# Patient Record
Sex: Male | Born: 1950
Health system: Southern US, Community
[De-identification: ages and names within clinical notes are randomized; demographics above are authoritative.]

## PROBLEM LIST (undated history)

## (undated) DIAGNOSIS — I10 Essential (primary) hypertension: Secondary | ICD-10-CM

## (undated) DIAGNOSIS — E782 Mixed hyperlipidemia: Secondary | ICD-10-CM

## (undated) DIAGNOSIS — Z8673 Personal history of transient ischemic attack (TIA), and cerebral infarction without residual deficits: Secondary | ICD-10-CM

## (undated) DIAGNOSIS — M75100 Unspecified rotator cuff tear or rupture of unspecified shoulder, not specified as traumatic: Secondary | ICD-10-CM

## (undated) DIAGNOSIS — E119 Type 2 diabetes mellitus without complications: Secondary | ICD-10-CM

## (undated) DIAGNOSIS — M109 Gout, unspecified: Secondary | ICD-10-CM

## (undated) DIAGNOSIS — I119 Hypertensive heart disease without heart failure: Secondary | ICD-10-CM

## (undated) DIAGNOSIS — G4730 Sleep apnea, unspecified: Secondary | ICD-10-CM

## (undated) DIAGNOSIS — M1990 Unspecified osteoarthritis, unspecified site: Secondary | ICD-10-CM

## (undated) DIAGNOSIS — D369 Benign neoplasm, unspecified site: Secondary | ICD-10-CM

## (undated) HISTORY — DX: Unspecified rotator cuff tear or rupture of unspecified shoulder, not specified as traumatic: M75.100

## (undated) HISTORY — DX: Type 2 diabetes mellitus without complications: E11.9

## (undated) HISTORY — DX: Sleep apnea, unspecified: G47.30

## (undated) HISTORY — DX: Essential (primary) hypertension: I10

## (undated) HISTORY — DX: Mixed hyperlipidemia: E78.2

## (undated) HISTORY — DX: Hypertensive heart disease without heart failure: I11.9

## (undated) HISTORY — PX: CARPAL TUNNEL RELEASE: SHX101

## (undated) HISTORY — DX: Gout, unspecified: M10.9

## (undated) HISTORY — DX: Personal history of transient ischemic attack (TIA), and cerebral infarction without residual deficits: Z86.73

## (undated) HISTORY — DX: Benign neoplasm, unspecified site: D36.9

## (undated) HISTORY — PX: APPENDECTOMY: SHX54

## (undated) SURGERY — COLONOSCOPY
Anesthesia: Moderate Sedation

---

## 1998-10-18 DIAGNOSIS — I639 Cerebral infarction, unspecified: Secondary | ICD-10-CM

## 1998-10-18 HISTORY — DX: Cerebral infarction, unspecified: I63.9

## 2004-02-11 ENCOUNTER — Ambulatory Visit (HOSPITAL_COMMUNITY): Admission: RE | Admit: 2004-02-11 | Discharge: 2004-02-11 | Payer: Self-pay | Admitting: Family Medicine

## 2010-04-23 ENCOUNTER — Emergency Department (HOSPITAL_COMMUNITY): Admission: EM | Admit: 2010-04-23 | Discharge: 2010-04-23 | Payer: Self-pay | Admitting: Emergency Medicine

## 2011-10-18 ENCOUNTER — Encounter: Payer: Self-pay | Admitting: Cardiology

## 2011-10-22 ENCOUNTER — Other Ambulatory Visit: Payer: Self-pay | Admitting: Cardiology

## 2011-10-22 ENCOUNTER — Ambulatory Visit (INDEPENDENT_AMBULATORY_CARE_PROVIDER_SITE_OTHER): Payer: Private Health Insurance - Indemnity | Admitting: Cardiology

## 2011-10-22 ENCOUNTER — Ambulatory Visit (HOSPITAL_COMMUNITY)
Admission: RE | Admit: 2011-10-22 | Discharge: 2011-10-22 | Disposition: A | Discharge status: Home / Self Care | Payer: Private Health Insurance - Indemnity | Source: Ambulatory Visit | Attending: Cardiology | Admitting: Cardiology

## 2011-10-22 ENCOUNTER — Encounter: Payer: Self-pay | Admitting: Cardiology

## 2011-10-22 VITALS — BP 178/98 | HR 60 | Resp 18 | Ht 74.0 in | Wt 259.0 lb

## 2011-10-22 DIAGNOSIS — E782 Mixed hyperlipidemia: Secondary | ICD-10-CM

## 2011-10-22 DIAGNOSIS — E1159 Type 2 diabetes mellitus with other circulatory complications: Secondary | ICD-10-CM | POA: Insufficient documentation

## 2011-10-22 DIAGNOSIS — E119 Type 2 diabetes mellitus without complications: Secondary | ICD-10-CM | POA: Insufficient documentation

## 2011-10-22 DIAGNOSIS — R0602 Shortness of breath: Secondary | ICD-10-CM

## 2011-10-22 DIAGNOSIS — E785 Hyperlipidemia, unspecified: Secondary | ICD-10-CM | POA: Insufficient documentation

## 2011-10-22 DIAGNOSIS — E1100 Type 2 diabetes mellitus with hyperosmolarity without nonketotic hyperglycemic-hyperosmolar coma (NKHHC): Secondary | ICD-10-CM

## 2011-10-22 DIAGNOSIS — G4730 Sleep apnea, unspecified: Secondary | ICD-10-CM

## 2011-10-22 DIAGNOSIS — I10 Essential (primary) hypertension: Secondary | ICD-10-CM | POA: Insufficient documentation

## 2011-10-22 DIAGNOSIS — R011 Cardiac murmur, unspecified: Secondary | ICD-10-CM

## 2011-10-22 DIAGNOSIS — R0989 Other specified symptoms and signs involving the circulatory and respiratory systems: Secondary | ICD-10-CM | POA: Insufficient documentation

## 2011-10-22 DIAGNOSIS — I517 Cardiomegaly: Secondary | ICD-10-CM

## 2011-10-22 DIAGNOSIS — R0609 Other forms of dyspnea: Secondary | ICD-10-CM | POA: Insufficient documentation

## 2011-10-22 NOTE — Assessment & Plan Note (Signed)
Most likely essential hypertension, poorly controlled. We discussed weight loss, sodium restriction, also reduction in regular alcohol intake. He reports compliance with his medications and plans to get a new blood pressure monitor - should record daily and bring to next visit. Plan further titration of medications as needed. Will check BMET, UA, TSH as well. Followup arranged.

## 2011-10-22 NOTE — Assessment & Plan Note (Signed)
Reports statin intolerance. Followed by Dr. Regino Schultze. Goal LDL optimally under 100 with diabetes.

## 2011-10-22 NOTE — Patient Instructions (Addendum)
**Note De-Identified Jenissa Tyrell Obfuscation** Your physician has requested that you regularly monitor and record your blood pressure readings at home. Please use the same machine at the same time of day to check your readings and record them to bring to your follow-up visit.  Your physician has requested that you have an echocardiogram. Echocardiography is a painless test that uses sound waves to create images of your heart. It provides your doctor with information about the size and shape of your heart and how well your heart's chambers and valves are working. This procedure takes approximately one hour. There are no restrictions for this procedure.  Your physician has recommended that you have a sleep study. This test records several body functions during sleep, including: brain activity, eye movement, oxygen and carbon dioxide blood levels, heart rate and rhythm, breathing rate and rhythm, the flow of air through your mouth and nose, snoring, body muscle movements, and chest and belly movement.  Your physician recommends that you return for lab work in: today   Your physician recommends that you continue on your current medications as directed. Please refer to the Current Medication list given to you today.  Your physician recommends that you schedule a follow-up appointment in: 3 weeks

## 2011-10-22 NOTE — Assessment & Plan Note (Signed)
Further assessment with echocardiogram.

## 2011-10-22 NOTE — Assessment & Plan Note (Signed)
Likely multifactorial, but need to exclude cardiomyopathy with longstanding hypertension. Echocardiogram arranged.

## 2011-10-22 NOTE — Assessment & Plan Note (Signed)
Followed by Dr. McGough. 

## 2011-10-22 NOTE — Assessment & Plan Note (Signed)
Symptoms are suggestive of OSA. Will refer to Pulmonary for formal evaluation.

## 2011-10-22 NOTE — Progress Notes (Signed)
*  PRELIMINARY RESULTS* Echocardiogram 2D Echocardiogram has been performed.  Benjamin [name] 10/22/2011, 1:16 PM

## 2011-10-22 NOTE — Progress Notes (Signed)
Clinical Summary Benjamin [name] is a 61 y.o.male referred for cardiology consultation by Dr. Regino Schultze secondary to hypertension. He reports a 15 year history of hypertension and diabetes, not sure about specific medications over the years. He has been on Lopressor for a while now and Tribenzor is new as of about a week. He got a home blood pressure monitor, but states that it has not worked, and he plans to get a new one.  Has reports no chest pain, but does have NYHA class II-III dyspnea on exertion. No palpitations or syncope. ECG reviewed.  He states that he is aware that he snores, does not always feel rested in the morning.  He drinks a case of beer each week.   Allergies  Allergen Reactions  . Advicor   . Zocor (Simvastatin - High Dose)     Current Outpatient Prescriptions  Medication Sig Dispense Refill  . aspirin 81 MG tablet Take 81 mg by mouth daily.      [name] CINNAMON PO Take by mouth daily.      [name] glimepiride (AMARYL) 4 MG tablet Take 4 mg by mouth 2 (two) times daily.      [name] ibuprofen (ADVIL,MOTRIN) 800 MG tablet Take 800 mg by mouth every 8 (eight) hours as needed.      . indomethacin (INDOCIN) 50 MG capsule Take 50 mg by mouth 3 (three) times daily with meals.      . metFORMIN (GLUCOPHAGE) 500 MG tablet Take 1,000 mg by mouth 2 (two) times daily with a meal.      . metoprolol (LOPRESSOR) 100 MG tablet Take 100 mg by mouth 2 (two) times daily.      . niacin (NIASPAN) 500 MG CR tablet Take 500 mg by mouth at bedtime.      . Olmesartan-Amlodipine-HCTZ (TRIBENZOR) 40-5-12.5 MG TABS Take by mouth daily.      . Omega-3 Fatty Acids (FISH OIL PO) Take by mouth daily.      . ranitidine (ZANTAC) 150 MG tablet Take 150 mg by mouth 2 (two) times daily.      . Red Yeast Rice Extract (RED YEAST RICE PO) Take by mouth daily.        Past Medical History  Diagnosis Date  . Essential hypertension, benign   . Type 2 diabetes mellitus   . Mixed hyperlipidemia     Statin intolerance     Past Surgical History  Procedure Date  . Appendectomy     Family History  Problem Relation Age of Onset  . Diabetes type II Mother     Social History Benjamin [name] reports that he has quit smoking. His smoking use included Cigarettes. He has never used smokeless tobacco. Benjamin [name] reports that he drinks alcohol.  Review of Systems Occasional nose bleeds at night. No exercise other than work. No reports melena or hematochezia.   Physical Examination Filed Vitals:   10/22/11 0907  BP: 178/98  Pulse: 60  Resp:    Obese male in NAD. HEENT: Conjunctiva and lids normal, oropharynx clear with moist mucosa. Neck: Supple, no elevated JVP or carotid bruits, no thyromegaly. Lungs: Clear to auscultation, nonlabored breathing at rest. Cardiac: Regular rate and rhythm, no S3, 2/6 systolic murmur, no pericardial rub. Abdomen: Protuberant, soft, nontender, bowel sounds present, no guarding or rebound. Extremities: No pitting edema, distal pulses 2+. Skin: Warm and dry. Musculoskeletal: No kyphosis. Neuropsychiatric: Alert and oriented x3, affect grossly appropriate.   ECG SInus rhythm with left axis, decreased R  wave progression, NST changes.    Problem List and Plan

## 2011-10-23 LAB — URINALYSIS
Bilirubin Urine: NEGATIVE
Glucose, UA: NEGATIVE mg/dL
Ketones, ur: NEGATIVE mg/dL
Protein, ur: 100 mg/dL — AB
Urobilinogen, UA: 0.2 mg/dL (ref 0.0–1.0)

## 2011-10-23 LAB — BASIC METABOLIC PANEL
BUN: 20 mg/dL (ref 6–23)
Chloride: 99 mEq/L (ref 96–112)
Creat: 1.1 mg/dL (ref 0.50–1.35)
Glucose, Bld: 162 mg/dL — ABNORMAL HIGH (ref 70–99)
Potassium: 4.1 mEq/L (ref 3.5–5.3)
Sodium: 141 mEq/L (ref 135–145)

## 2011-10-23 NOTE — Progress Notes (Signed)
**Note De-Identified Genecis Veley Obfuscation** Results letter sent to pt's address./LV

## 2011-11-16 ENCOUNTER — Encounter: Payer: Self-pay | Admitting: Cardiology

## 2011-11-16 ENCOUNTER — Ambulatory Visit (INDEPENDENT_AMBULATORY_CARE_PROVIDER_SITE_OTHER): Payer: Private Health Insurance - Indemnity | Admitting: Cardiology

## 2011-11-16 VITALS — BP 193/97 | HR 64 | Ht 73.0 in | Wt 261.0 lb

## 2011-11-16 DIAGNOSIS — R011 Cardiac murmur, unspecified: Secondary | ICD-10-CM

## 2011-11-16 DIAGNOSIS — G4730 Sleep apnea, unspecified: Secondary | ICD-10-CM

## 2011-11-16 DIAGNOSIS — I10 Essential (primary) hypertension: Secondary | ICD-10-CM

## 2011-11-16 MED ORDER — OLMESARTAN-AMLODIPINE-HCTZ 40-10-25 MG PO TABS: 1.0000 | ORAL_TABLET | Freq: Every day | ORAL | Status: DC

## 2011-11-16 NOTE — Assessment & Plan Note (Signed)
Calcified aortic valve without significant degree of stenosis.

## 2011-11-16 NOTE — Assessment & Plan Note (Signed)
Testing reviewed. Plan to increase Tribenzor to 40/10/25 mg daily for now and continue Lopressor. Followup arranged.

## 2011-11-16 NOTE — Patient Instructions (Signed)
**Note De-Identified Tunisia Landgrebe Obfuscation** Your physician has recommended you make the following change in your medication: increase Tribenzor to 40/10/25 mg daily  Your physician recommends that you schedule a follow-up appointment in: 1 month

## 2011-11-16 NOTE — Assessment & Plan Note (Signed)
Follow up for sleep study

## 2011-11-16 NOTE — Progress Notes (Signed)
   Clinical Summary Benjamin [name] is a 61 y.o.male presenting for followup. He was seen in February for evaluation of hypertension.  Labwork showed potassium 4.1, BUN 20, creatinine 1.1, TSH 1.7, UA with proteinuria. Echocardiogram showed moderate LVH with LVEF 65-70%, calcified AV without significant degree of stenosis. His sleep study is pending.  He reports no new symptoms. States home blood pressures are similar to today's.   Allergies  Allergen Reactions  . Advicor   . Zocor (Simvastatin - High Dose)     Current Outpatient Prescriptions  Medication Sig Dispense Refill  . aspirin 81 MG tablet Take 81 mg by mouth daily.      [name] CINNAMON PO Take by mouth daily.      [name] glimepiride (AMARYL) 4 MG tablet Take 4 mg by mouth 2 (two) times daily.      [name] ibuprofen (ADVIL,MOTRIN) 800 MG tablet Take 800 mg by mouth every 8 (eight) hours as needed.      . indomethacin (INDOCIN) 50 MG capsule Take 50 mg by mouth 3 (three) times daily with meals.      . metFORMIN (GLUCOPHAGE) 500 MG tablet Take 1,000 mg by mouth 2 (two) times daily with a meal.      . metoprolol (LOPRESSOR) 100 MG tablet Take 100 mg by mouth 2 (two) times daily.      . niacin (NIASPAN) 500 MG CR tablet Take 500 mg by mouth at bedtime.      . Olmesartan-Amlodipine-HCTZ (TRIBENZOR) 40-5-12.5 MG TABS Take by mouth daily.      . Omega-3 Fatty Acids (FISH OIL PO) Take by mouth daily.      . ranitidine (ZANTAC) 150 MG tablet Take 150 mg by mouth 2 (two) times daily.      . Red Yeast Rice Extract (RED YEAST RICE PO) Take by mouth daily.        Past Medical History  Diagnosis Date  . Essential hypertension, benign   . Type 2 diabetes mellitus   . Mixed hyperlipidemia     Statin intolerance    Social History Benjamin [name] reports that he has quit smoking. His smoking use included Cigarettes. He has never used smokeless tobacco. Benjamin [name] reports that he drinks alcohol.  Review of Systems Negative except as outlined.  Physical  Examination Filed Vitals:   11/16/11 1517  BP: 193/97  Pulse: 64   Obese male in NAD.  HEENT: Conjunctiva and lids normal, oropharynx clear with moist mucosa.  Neck: Supple, no elevated JVP or carotid bruits, no thyromegaly.  Lungs: Clear to auscultation, nonlabored breathing at rest.  Cardiac: Regular rate and rhythm, no S3, 2/6 systolic murmur, no pericardial rub.  Abdomen: Protuberant, soft, nontender, bowel sounds present, no guarding or rebound.  Extremities: No pitting edema, distal pulses 2+.  Skin: Warm and dry.  Musculoskeletal: No kyphosis.  Neuropsychiatric: Alert and oriented x3, affect grossly appropriate.    Problem List and Plan

## 2011-12-17 ENCOUNTER — Telehealth: Payer: Self-pay | Admitting: Cardiology

## 2011-12-17 NOTE — Telephone Encounter (Signed)
PT HAS RUN OUT OF TRIBENZOR SAMPLES AND NEED MORE SAMPLES TO GET HIM THROUGH TILL NEXT FOLLOW UP IS SCHEDULED FOR 12/24/11 BUT THEY THINK THEY WILL HAVE TO R/S. HE WILL CALL us BACK. IF THEY R.S IT WILL BE TO 01/13/12 DR MCDOWELL'S NEXT AVAIL [place].

## 2011-12-17 NOTE — Telephone Encounter (Signed)
Pt. states his daughter will come by office today to pick up samples of Tribenzor./LV

## 2011-12-17 NOTE — Telephone Encounter (Signed)
**Note De-Identified Benjamin [name] Obfuscation** Pt. Is advised that we are out of samples of Tribenzor 40mg /10mg /25 mg but that we do have 21 tablets of 20mg /5mg /12.5 mg. Pt. Is instructed to take 2 tablets of the 20/5/12.5 mg to equal 40/10/25 mg daily, he repeated instructions back to me and verbalized understanding. Pt. States that he had to rescheduled sleep study again due to work (he has canceled X 2). Pt. Also states that he will keep appt. that is scheduled for 4-5 with Dr. Diona Browner./LV

## 2011-12-21 ENCOUNTER — Ambulatory Visit (INDEPENDENT_AMBULATORY_CARE_PROVIDER_SITE_OTHER): Payer: Private Health Insurance - Indemnity | Admitting: Cardiology

## 2011-12-21 ENCOUNTER — Encounter: Payer: Self-pay | Admitting: Cardiology

## 2011-12-21 VITALS — BP 157/87 | HR 66 | Wt 274.0 lb

## 2011-12-21 DIAGNOSIS — I10 Essential (primary) hypertension: Secondary | ICD-10-CM

## 2011-12-21 DIAGNOSIS — G4730 Sleep apnea, unspecified: Secondary | ICD-10-CM

## 2011-12-21 MED ORDER — LOSARTAN POTASSIUM 50 MG PO TABS: 50.0000 mg | ORAL_TABLET | Freq: Every day | ORAL | Status: DC

## 2011-12-21 MED ORDER — AMLODIPINE BESYLATE 10 MG PO TABS: 10.0000 mg | ORAL_TABLET | Freq: Every day | ORAL | Status: DC

## 2011-12-21 MED ORDER — HYDROCHLOROTHIAZIDE 25 MG PO TABS: 25.0000 mg | ORAL_TABLET | Freq: Every day | ORAL | Status: DC

## 2011-12-21 NOTE — Assessment & Plan Note (Signed)
Did not complete sleep testing.

## 2011-12-21 NOTE — Assessment & Plan Note (Signed)
Change from Tribenzor samples to Cozaar 50 mg daily, HCTZ 25 mg daily, and Norvasc 10 mg daily - all generics. Followup arranged.

## 2011-12-21 NOTE — Progress Notes (Signed)
   Clinical Summary Mr. Mcknight is a 61 y.o.male presenting for followup. He was seen in March. He states that he has been doing well overall. Blood pressure trend is better. He has been on samples of Tribenzor, and did run out at one point.  We discussed changing him to an all generic regimen.  He was not able to complete a sleep study since last visit, related to insurance restrictions by report.   Allergies  Allergen Reactions  . Advicor   . Zocor (Simvastatin - High Dose)     Current Outpatient Prescriptions  Medication Sig Dispense Refill  . aspirin 81 MG tablet Take 81 mg by mouth daily.      [name] CINNAMON PO Take by mouth daily.      [name] glimepiride (AMARYL) 4 MG tablet Take 4 mg by mouth 2 (two) times daily.      [name] ibuprofen (ADVIL,MOTRIN) 800 MG tablet Take 800 mg by mouth every 8 (eight) hours as needed.      . indomethacin (INDOCIN) 50 MG capsule Take 50 mg by mouth 3 (three) times daily with meals.      . metFORMIN (GLUCOPHAGE) 500 MG tablet Take 1,000 mg by mouth 2 (two) times daily with a meal.      . metoprolol (LOPRESSOR) 100 MG tablet Take 100 mg by mouth 2 (two) times daily.      . niacin (NIASPAN) 500 MG CR tablet Take 500 mg by mouth at bedtime.      . Omega-3 Fatty Acids (FISH OIL PO) Take by mouth daily.      . ranitidine (ZANTAC) 150 MG tablet Take 150 mg by mouth 2 (two) times daily.      . Red Yeast Rice Extract (RED YEAST RICE PO) Take by mouth daily.      [name] amLODipine (NORVASC) 10 MG tablet Take 1 tablet (10 mg total) by mouth daily.  30 tablet  3  . hydrochlorothiazide (HYDRODIURIL) 25 MG tablet Take 1 tablet (25 mg total) by mouth daily.  30 tablet  3  . losartan (COZAAR) 50 MG tablet Take 1 tablet (50 mg total) by mouth daily.  30 tablet  3    Past Medical History  Diagnosis Date  . Essential hypertension, benign   . Type 2 diabetes mellitus   . Mixed hyperlipidemia     Statin intolerance    Past Surgical History  Procedure Date  . Appendectomy      Social History Mr. Schoenfeld reports that he has quit smoking. His smoking use included Cigarettes. He has never used smokeless tobacco. Mr. Mohler reports that he drinks alcohol.  Review of Systems No palpitations or syncope. Otherwise negative.  Physical Examination Filed Vitals:   12/21/11 1500  BP: 157/87  Pulse: 66   Obese male in NAD.  HEENT: Conjunctiva and lids normal, oropharynx clear with moist mucosa.  Neck: Supple, no elevated JVP or carotid bruits, no thyromegaly.  Lungs: Clear to auscultation, nonlabored breathing at rest.  Cardiac: Regular rate and rhythm, no S3, 2/6 systolic murmur, no pericardial rub.  Abdomen: Protuberant, soft, nontender, bowel sounds present, no guarding or rebound.  Extremities: No pitting edema, distal pulses 2+.  Skin: Warm and dry.  Musculoskeletal: No kyphosis.  Neuropsychiatric: Alert and oriented x3, affect grossly appropriate.    Problem List and Plan

## 2011-12-21 NOTE — Patient Instructions (Signed)
**Note De-Identified Benjamin [name] Obfuscation** Your physician has recommended you make the following change in your medication: stop taking Tribenzor and start taking Amlodipine 10 mg daily, HCTZ 25 mg daily and Losartan 50 mg daily  Your physician recommends that you schedule a follow-up appointment in: 3 months

## 2011-12-31 ENCOUNTER — Telehealth: Payer: Self-pay | Admitting: Cardiology

## 2011-12-31 NOTE — Telephone Encounter (Signed)
**Note De-identified Benjamin [name] Obfuscation** LMOM./LV 

## 2011-12-31 NOTE — Telephone Encounter (Signed)
PT HAD MEDICATION CHANGES AND STATES THAT HIS SHOULDER AND BACK HAVE BEEN HURTING ON NEW MEDS/TMJ

## 2012-01-01 NOTE — Telephone Encounter (Signed)
**Note De-Identified Kanesha Cadle Obfuscation** Pt. states that he now thinks he may have over worked his shoulders and back over the weekend and is unsure if the meds is contributing to the pain or not. Pt. Is advised to call office if pain worsens or does not improve within a week, he verbalized understanding./LV

## 2012-12-20 ENCOUNTER — Emergency Department (HOSPITAL_COMMUNITY): Payer: 59

## 2012-12-20 ENCOUNTER — Encounter (HOSPITAL_COMMUNITY): Payer: Self-pay

## 2012-12-20 ENCOUNTER — Inpatient Hospital Stay (HOSPITAL_COMMUNITY)
Admission: EM | Admit: 2012-12-20 | Discharge: 2012-12-23 | DRG: 291 | Disposition: A | Discharge status: Home Health | Payer: 59 | Attending: Internal Medicine | Admitting: Internal Medicine

## 2012-12-20 DIAGNOSIS — E119 Type 2 diabetes mellitus without complications: Secondary | ICD-10-CM | POA: Diagnosis present

## 2012-12-20 DIAGNOSIS — Z6835 Body mass index (BMI) 35.0-35.9, adult: Secondary | ICD-10-CM

## 2012-12-20 DIAGNOSIS — I10 Essential (primary) hypertension: Secondary | ICD-10-CM | POA: Diagnosis present

## 2012-12-20 DIAGNOSIS — I509 Heart failure, unspecified: Secondary | ICD-10-CM

## 2012-12-20 DIAGNOSIS — IMO0001 Reserved for inherently not codable concepts without codable children: Secondary | ICD-10-CM | POA: Diagnosis present

## 2012-12-20 DIAGNOSIS — M19019 Primary osteoarthritis, unspecified shoulder: Secondary | ICD-10-CM | POA: Diagnosis present

## 2012-12-20 DIAGNOSIS — Z79899 Other long term (current) drug therapy: Secondary | ICD-10-CM

## 2012-12-20 DIAGNOSIS — E785 Hyperlipidemia, unspecified: Secondary | ICD-10-CM | POA: Diagnosis present

## 2012-12-20 DIAGNOSIS — E876 Hypokalemia: Secondary | ICD-10-CM | POA: Diagnosis present

## 2012-12-20 DIAGNOSIS — E1159 Type 2 diabetes mellitus with other circulatory complications: Secondary | ICD-10-CM | POA: Diagnosis present

## 2012-12-20 DIAGNOSIS — R011 Cardiac murmur, unspecified: Secondary | ICD-10-CM

## 2012-12-20 DIAGNOSIS — R0602 Shortness of breath: Secondary | ICD-10-CM

## 2012-12-20 DIAGNOSIS — E782 Mixed hyperlipidemia: Secondary | ICD-10-CM | POA: Diagnosis present

## 2012-12-20 DIAGNOSIS — I5031 Acute diastolic (congestive) heart failure: Principal | ICD-10-CM | POA: Diagnosis present

## 2012-12-20 DIAGNOSIS — G4730 Sleep apnea, unspecified: Secondary | ICD-10-CM

## 2012-12-20 DIAGNOSIS — K219 Gastro-esophageal reflux disease without esophagitis: Secondary | ICD-10-CM | POA: Diagnosis present

## 2012-12-20 DIAGNOSIS — J9600 Acute respiratory failure, unspecified whether with hypoxia or hypercapnia: Secondary | ICD-10-CM | POA: Diagnosis present

## 2012-12-20 DIAGNOSIS — I5030 Unspecified diastolic (congestive) heart failure: Secondary | ICD-10-CM

## 2012-12-20 DIAGNOSIS — Z87891 Personal history of nicotine dependence: Secondary | ICD-10-CM

## 2012-12-20 DIAGNOSIS — E669 Obesity, unspecified: Secondary | ICD-10-CM | POA: Diagnosis present

## 2012-12-20 LAB — CBC WITH DIFFERENTIAL/PLATELET
Basophils Relative: 0 % (ref 0–1)
Eosinophils Absolute: 0.1 10*3/uL (ref 0.0–0.7)
Lymphs Abs: 1.7 10*3/uL (ref 0.7–4.0)
MCH: 30.2 pg (ref 26.0–34.0)
Monocytes Absolute: 0.8 10*3/uL (ref 0.1–1.0)
Monocytes Relative: 6 % (ref 3–12)
Neutro Abs: 10.8 10*3/uL — ABNORMAL HIGH (ref 1.7–7.7)
Neutrophils Relative %: 81 % — ABNORMAL HIGH (ref 43–77)
Platelets: 239 10*3/uL (ref 150–400)
RDW: 14.4 % (ref 11.5–15.5)
WBC: 13.4 10*3/uL — ABNORMAL HIGH (ref 4.0–10.5)

## 2012-12-20 LAB — COMPREHENSIVE METABOLIC PANEL
Albumin: 3.7 g/dL (ref 3.5–5.2)
Alkaline Phosphatase: 106 U/L (ref 39–117)
BUN: 22 mg/dL (ref 6–23)
CO2: 32 mEq/L (ref 19–32)
Chloride: 92 mEq/L — ABNORMAL LOW (ref 96–112)
Creatinine, Ser: 1.16 mg/dL (ref 0.50–1.35)
GFR calc Af Amer: 76 mL/min — ABNORMAL LOW (ref >90)
GFR calc non Af Amer: 65 mL/min — ABNORMAL LOW (ref >90)
Glucose, Bld: 212 mg/dL — ABNORMAL HIGH (ref 70–99)
Potassium: 3.7 mEq/L (ref 3.5–5.1)
Total Bilirubin: 0.5 mg/dL (ref 0.3–1.2)

## 2012-12-20 LAB — TROPONIN I
Troponin I: <0.3 ng/mL (ref <0.30)
Troponin I: <0.3 ng/mL (ref <0.30)

## 2012-12-20 LAB — MAGNESIUM: Magnesium: 2 mg/dL (ref 1.5–2.5)

## 2012-12-20 LAB — HEMOGLOBIN A1C: Mean Plasma Glucose: 151 mg/dL — ABNORMAL HIGH (ref <117)

## 2012-12-20 LAB — PROTIME-INR: Prothrombin Time: 13.6 seconds (ref 11.6–15.2)

## 2012-12-20 LAB — MRSA PCR SCREENING: MRSA by PCR: NEGATIVE

## 2012-12-20 MED ORDER — METOPROLOL TARTRATE 50 MG PO TABS
100.0000 mg | ORAL_TABLET | Freq: Two times a day (BID) | ORAL | Status: DC
Start: 2012-12-20 — End: 2012-12-23
  Administered 2012-12-20 – 2012-12-22 (×5): 100 mg via ORAL
  Filled 2012-12-20 (×5): qty 2

## 2012-12-20 MED ORDER — FUROSEMIDE 10 MG/ML IJ SOLN
40.0000 mg | Freq: Two times a day (BID) | INTRAMUSCULAR | Status: DC
  Administered 2012-12-20: 40 mg via INTRAVENOUS
  Filled 2012-12-20: qty 4

## 2012-12-20 MED ORDER — SODIUM CHLORIDE 0.9 % IJ SOLN
3.0000 mL | Freq: Two times a day (BID) | INTRAMUSCULAR | Status: DC
  Administered 2012-12-22: 3 mL via INTRAVENOUS

## 2012-12-20 MED ORDER — SODIUM CHLORIDE 0.9 % IV SOLN
INTRAVENOUS | Status: DC
  Administered 2012-12-20: 13:00:00 via INTRAVENOUS

## 2012-12-20 MED ORDER — SODIUM CHLORIDE 0.9 % IJ SOLN
3.0000 mL | Freq: Two times a day (BID) | INTRAMUSCULAR | Status: DC
  Administered 2012-12-21 – 2012-12-22 (×2): 3 mL via INTRAVENOUS

## 2012-12-20 MED ORDER — CHLORHEXIDINE GLUCONATE 0.12 % MT SOLN
15.0000 mL | Freq: Two times a day (BID) | OROMUCOSAL | Status: DC
  Administered 2012-12-20 – 2012-12-21 (×3): 15 mL via OROMUCOSAL
  Filled 2012-12-20 (×3): qty 15

## 2012-12-20 MED ORDER — DIAZEPAM 5 MG PO TABS
10.0000 mg | ORAL_TABLET | Freq: Every evening | ORAL | Status: DC | PRN
  Administered 2012-12-20 – 2012-12-21 (×2): 10 mg via ORAL
  Filled 2012-12-20 (×2): qty 2

## 2012-12-20 MED ORDER — ASPIRIN EC 81 MG PO TBEC
81.0000 mg | DELAYED_RELEASE_TABLET | Freq: Every day | ORAL | Status: DC
  Administered 2012-12-20 – 2012-12-22 (×3): 81 mg via ORAL
  Filled 2012-12-20 (×3): qty 1

## 2012-12-20 MED ORDER — SODIUM CHLORIDE 0.9 % IJ SOLN: 3.0000 mL | INTRAMUSCULAR | Status: DC | PRN

## 2012-12-20 MED ORDER — BIOTENE DRY MOUTH MT LIQD
15.0000 mL | Freq: Two times a day (BID) | OROMUCOSAL | Status: DC
  Administered 2012-12-20 – 2012-12-21 (×3): 15 mL via OROMUCOSAL

## 2012-12-20 MED ORDER — HYDROCODONE-ACETAMINOPHEN 5-325 MG PO TABS
1.0000 | ORAL_TABLET | Freq: Four times a day (QID) | ORAL | Status: DC | PRN
Start: 2012-12-20 — End: 2012-12-23
  Administered 2012-12-21 – 2012-12-22 (×3): 1 via ORAL
  Filled 2012-12-20 (×3): qty 1

## 2012-12-20 MED ORDER — SENNOSIDES-DOCUSATE SODIUM 8.6-50 MG PO TABS: 1.0000 | ORAL_TABLET | Freq: Every evening | ORAL | Status: DC | PRN

## 2012-12-20 MED ORDER — LOSARTAN POTASSIUM 50 MG PO TABS
50.0000 mg | ORAL_TABLET | Freq: Every day | ORAL | Status: DC
  Administered 2012-12-21 – 2012-12-22 (×2): 50 mg via ORAL
  Filled 2012-12-20 (×2): qty 1

## 2012-12-20 MED ORDER — ACETAMINOPHEN 650 MG RE SUPP: 650.0000 mg | Freq: Four times a day (QID) | RECTAL | Status: DC | PRN

## 2012-12-20 MED ORDER — NITROGLYCERIN 2 % TD OINT
1.0000 [in_us] | TOPICAL_OINTMENT | Freq: Once | TRANSDERMAL | Status: AC
  Administered 2012-12-20: 1 [in_us] via TOPICAL
  Filled 2012-12-20: qty 1

## 2012-12-20 MED ORDER — AMLODIPINE BESYLATE 5 MG PO TABS
10.0000 mg | ORAL_TABLET | Freq: Every day | ORAL | Status: DC
  Administered 2012-12-21 – 2012-12-22 (×2): 10 mg via ORAL
  Filled 2012-12-20 (×2): qty 2

## 2012-12-20 MED ORDER — LIVING BETTER WITH HEART FAILURE BOOK
Freq: Once | Status: DC
  Filled 2012-12-20: qty 1

## 2012-12-20 MED ORDER — FUROSEMIDE 10 MG/ML IJ SOLN
80.0000 mg | Freq: Once | INTRAMUSCULAR | Status: AC
  Administered 2012-12-20: 80 mg via INTRAVENOUS
  Filled 2012-12-20: qty 8

## 2012-12-20 MED ORDER — SODIUM CHLORIDE 0.9 % IV SOLN
250.0000 mL | INTRAVENOUS | Status: DC | PRN
  Administered 2012-12-20: 1000 mL via INTRAVENOUS

## 2012-12-20 MED ORDER — NIACIN ER (ANTIHYPERLIPIDEMIC) 500 MG PO TBCR
500.0000 mg | EXTENDED_RELEASE_TABLET | Freq: Every day | ORAL | Status: DC
  Administered 2012-12-20 – 2012-12-22 (×3): 500 mg via ORAL
  Filled 2012-12-20 (×4): qty 1

## 2012-12-20 MED ORDER — ENOXAPARIN SODIUM 60 MG/0.6ML ~~LOC~~ SOLN
60.0000 mg | Freq: Every day | SUBCUTANEOUS | Status: DC
  Administered 2012-12-20 – 2012-12-22 (×3): 60 mg via SUBCUTANEOUS
  Filled 2012-12-20 (×3): qty 0.6

## 2012-12-20 MED ORDER — GLIMEPIRIDE 2 MG PO TABS
4.0000 mg | ORAL_TABLET | Freq: Two times a day (BID) | ORAL | Status: DC
  Administered 2012-12-20 – 2012-12-23 (×6): 4 mg via ORAL
  Filled 2012-12-20 (×5): qty 2
  Filled 2012-12-20: qty 1

## 2012-12-20 MED ORDER — METFORMIN HCL 500 MG PO TABS
1000.0000 mg | ORAL_TABLET | Freq: Two times a day (BID) | ORAL | Status: DC
  Administered 2012-12-20 – 2012-12-23 (×6): 1000 mg via ORAL
  Filled 2012-12-20: qty 2
  Filled 2012-12-20: qty 1
  Filled 2012-12-20 (×3): qty 2
  Filled 2012-12-20: qty 1
  Filled 2012-12-20: qty 2

## 2012-12-20 MED ORDER — NITROGLYCERIN 2 % TD OINT
1.0000 [in_us] | TOPICAL_OINTMENT | Freq: Four times a day (QID) | TRANSDERMAL | Status: DC
  Administered 2012-12-20 – 2012-12-23 (×10): 1 [in_us] via TOPICAL
  Filled 2012-12-20 (×11): qty 1

## 2012-12-20 MED ORDER — ACETAMINOPHEN 325 MG PO TABS: 650.0000 mg | ORAL_TABLET | Freq: Four times a day (QID) | ORAL | Status: DC | PRN

## 2012-12-20 MED ORDER — FAMOTIDINE 20 MG PO TABS
20.0000 mg | ORAL_TABLET | Freq: Two times a day (BID) | ORAL | Status: DC
  Administered 2012-12-20 – 2012-12-22 (×5): 20 mg via ORAL
  Filled 2012-12-20 (×5): qty 1

## 2012-12-20 NOTE — ED Notes (Addendum)
Pt c/o SOB x 2 days.  Reports has been lethargic, sleeping a lot.  Wife says this morning pt has been stumbling around.  Also reports feet swelling.  Pt alert and oriented.  Pt denies any pain.  Pt's breathing is labored, dusky look to mouth and nail beds.  CBG 220

## 2012-12-20 NOTE — ED Provider Notes (Addendum)
History    This chart was scribed for Benjamin Cooper III, MD by Charolett Bumpers, ED Scribe. The patient was seen in room [facility]14/[facility]14. Patient's care was started at 10:42.   CSN: [id number]  Arrival date & time 12/20/12  1035   First MD Initiated Contact with Patient 12/20/12 1042      Chief Complaint  Patient presents with  . Shortness of Breath    The history is provided by the patient and the spouse. No language interpreter was used.   Benjamin [name] is a 62 y.o. male who presents to the Emergency Department complaining of a week of gradual onset, gradually worsening SOB that has gradually worsened over the past 2 days with associated leg swelling. He denies any h/o similar symptoms. Wife reports that he has been more lethargic and sleeping more recently. She states that this morning, he was stumbling around. He denies any complaints of pain. He has a h/o HTN, Type II DM and mixed hyperlipidemia. Upon arrival, pt's O2 saturation was 29% on RA, O2 is now 94% with O2.  Past Medical History  Diagnosis Date  . Essential hypertension, benign   . Type 2 diabetes mellitus   . Mixed hyperlipidemia     Statin intolerance    Past Surgical History  Procedure Laterality Date  . Appendectomy      Family History  Problem Relation Age of Onset  . Diabetes type II Mother     History  Substance Use Topics  . Smoking status: Former Smoker    Types: Cigarettes  . Smokeless tobacco: Never Used  . Alcohol Use: Yes     Comment: occ      Review of Systems  Respiratory: Positive for shortness of breath.   Cardiovascular: Positive for leg swelling.  All other systems reviewed and are negative.    Allergies  Niacin-lovastatin er and Zocor  Home Medications   Current Outpatient Rx  Name  Route  Sig  Dispense  Refill  . amLODipine (NORVASC) 10 MG tablet   Oral   Take 1 tablet (10 mg total) by mouth daily.   30 tablet   3   . aspirin 81 MG tablet   Oral   Take 81 mg by  mouth daily.         [name] CINNAMON PO   Oral   Take by mouth daily.         [name] glimepiride (AMARYL) 4 MG tablet   Oral   Take 4 mg by mouth 2 (two) times daily.         . hydrochlorothiazide (HYDRODIURIL) 25 MG tablet   Oral   Take 1 tablet (25 mg total) by mouth daily.   30 tablet   3   . ibuprofen (ADVIL,MOTRIN) 800 MG tablet   Oral   Take 800 mg by mouth every 8 (eight) hours as needed.         . indomethacin (INDOCIN) 50 MG capsule   Oral   Take 50 mg by mouth 3 (three) times daily with meals.         [name] losartan (COZAAR) 50 MG tablet   Oral   Take 1 tablet (50 mg total) by mouth daily.   30 tablet   3   . metFORMIN (GLUCOPHAGE) 500 MG tablet   Oral   Take 1,000 mg by mouth 2 (two) times daily with a meal.         . metoprolol (LOPRESSOR) 100 MG tablet  Oral   Take 100 mg by mouth 2 (two) times daily.         . niacin (NIASPAN) 500 MG CR tablet   Oral   Take 500 mg by mouth at bedtime.         . Omega-3 Fatty Acids (FISH OIL PO)   Oral   Take by mouth daily.         . ranitidine (ZANTAC) 150 MG tablet   Oral   Take 150 mg by mouth 2 (two) times daily.         . Red Yeast Rice Extract (RED YEAST RICE PO)   Oral   Take by mouth daily.           BP 176/93  Pulse 73  Temp(Src) 99 F (37.2 C) (Oral)  Resp 24  Ht 6\' 2"  (1.88 m)  Wt 270 lb (122.471 kg)  BMI 34.65 kg/m2  SpO2 94%  Physical Exam  Nursing note and vitals reviewed. Constitutional: He is oriented to person, place, and time. He appears well-developed and well-nourished. No distress.  HENT:  Head: Normocephalic and atraumatic.  Facial edema.  Eyes: EOM are normal. Pupils are equal, round, and reactive to light.  Neck: Normal range of motion. Neck supple. No tracheal deviation present.  Cardiovascular: Normal rate.   Pulmonary/Chest: Effort normal. No respiratory distress. He has rales.  Rales at both bases.   Abdominal: Soft. He exhibits no distension.   Musculoskeletal: Normal range of motion. He exhibits no edema.  2+ ankle edema bilaterally.   Neurological: He is alert and oriented to person, place, and time.  Skin: Skin is warm and dry.  Psychiatric: He has a normal mood and affect. His behavior is normal.    ED Course  CRITICAL CARE Performed by: Osvaldo Human Authorized by: Osvaldo Human Total critical care time: 30 minutes Critical care was necessary to treat or prevent imminent or life-threatening deterioration of the following conditions: Acute congestive heart failure requiring STAT response to bedside, IV lasix, topical NTG, and BiPap to treat.  Critical care was time spent personally by me on the following activities: development of treatment plan with patient or surrogate, discussions with consultants, evaluation of patient's response to treatment, examination of patient, obtaining history from patient or surrogate, ordering and performing treatments and interventions, ordering and review of laboratory studies, ordering and review of radiographic studies, pulse oximetry and re-evaluation of patient's condition.   (including critical care time)  DIAGNOSTIC STUDIES: Oxygen Saturation is 94% on [place], adequate by my interpretation.    COORDINATION OF CARE:  10:45 AM-Discussed planned course of treatment with the pt and wife, including Lasix, Nitroglycerin, chest x-ray, blood work and UA, who are agreeable at this time. Will also start the pt on BiPAP.   10:49 AM  Date: 12/20/2012  Rate: 76  Rhythm: normal sinus rhythm and premature atrial contractions (PAC)  QRS Axis: left  Intervals: PR prolonged QRS:  Poor R wave progression in precordial leads suggests old anterior myocardial infarction.  ST/T Wave abnormalities: normal  Conduction Disutrbances:  Incomplete right bundle branch block.  Narrative Interpretation: Abnormal EKG  Old EKG Reviewed: none available  11:00 AM-Medication Orders: Furosemide (Lasix)  injection 80 mg-once; Nitroglycerin (Nitroglyn) 2% ointment 1 inch-once.   Results for orders placed during the hospital encounter of 12/20/12  CBC WITH DIFFERENTIAL      Result Value Range   WBC 13.4 (*) 4.0 - 10.5 K/uL   RBC 4.54  4.22 -  5.81 MIL/uL   Hemoglobin 13.7  13.0 - 17.0 g/dL   HCT 16.1  09.6 - 04.5 %   MCV 92.7  78.0 - 100.0 fL   MCH 30.2  26.0 - 34.0 pg   MCHC 32.5  30.0 - 36.0 g/dL   RDW 40.9  81.1 - 91.4 %   Platelets 239  150 - 400 K/uL   Neutrophils Relative 81 (*) 43 - 77 %   Neutro Abs 10.8 (*) 1.7 - 7.7 K/uL   Lymphocytes Relative 13  12 - 46 %   Lymphs Abs 1.7  0.7 - 4.0 K/uL   Monocytes Relative 6  3 - 12 %   Monocytes Absolute 0.8  0.1 - 1.0 K/uL   Eosinophils Relative 1  0 - 5 %   Eosinophils Absolute 0.1  0.0 - 0.7 K/uL   Basophils Relative 0  0 - 1 %   Basophils Absolute 0.0  0.0 - 0.1 K/uL  TROPONIN I      Result Value Range   Troponin I <0.30  <0.30 ng/mL  PRO B NATRIURETIC PEPTIDE      Result Value Range   Pro B Natriuretic peptide (BNP) 929.0 (*) 0 - 125 pg/mL  COMPREHENSIVE METABOLIC PANEL      Result Value Range   Sodium 134 (*) 135 - 145 mEq/L   Potassium 3.7  3.5 - 5.1 mEq/L   Chloride 92 (*) 96 - 112 mEq/L   CO2 32  19 - 32 mEq/L   Glucose, Bld 212 (*) 70 - 99 mg/dL   BUN 22  6 - 23 mg/dL   Creatinine, Ser 7.82  0.50 - 1.35 mg/dL   Calcium 8.6  8.4 - 95.6 mg/dL   Total Protein 8.1  6.0 - 8.3 g/dL   Albumin 3.7  3.5 - 5.2 g/dL   AST 35  0 - 37 U/L   ALT 52  0 - 53 U/L   Alkaline Phosphatase 106  39 - 117 U/L   Total Bilirubin 0.5  0.3 - 1.2 mg/dL   GFR calc non Af Amer 65 (*) >90 mL/min   GFR calc Af Amer 76 (*) >90 mL/min  MAGNESIUM      Result Value Range   Magnesium 2.0  1.5 - 2.5 mg/dL  PROTIME-INR      Result Value Range   Prothrombin Time 13.6  11.6 - 15.2 seconds   INR 1.05  0.00 - 1.49  APTT      Result Value Range   aPTT 31  24 - 37 seconds   Dg Chest Portable 1 View  12/20/2012  *RADIOLOGY REPORT*  Clinical Data:  Shortness of breath.  PORTABLE CHEST - 1 VIEW  Comparison: None.  Findings: Diffuse interstitial edema/CHF present with associated cardiomegaly.  No large pleural effusions are identified.  IMPRESSION: CHF and cardiomegaly.   Original Report Authenticated By: Irish Lack, M.D.    11:51 AM Results for orders placed during the hospital encounter of 12/20/12  CBC WITH DIFFERENTIAL      Result Value Range   WBC 13.4 (*) 4.0 - 10.5 K/uL   RBC 4.54  4.22 - 5.81 MIL/uL   Hemoglobin 13.7  13.0 - 17.0 g/dL   HCT 21.3  08.6 - 57.8 %   MCV 92.7  78.0 - 100.0 fL   MCH 30.2  26.0 - 34.0 pg   MCHC 32.5  30.0 - 36.0 g/dL   RDW 46.9  62.9 - 52.8 %   Platelets  239  150 - 400 K/uL   Neutrophils Relative 81 (*) 43 - 77 %   Neutro Abs 10.8 (*) 1.7 - 7.7 K/uL   Lymphocytes Relative 13  12 - 46 %   Lymphs Abs 1.7  0.7 - 4.0 K/uL   Monocytes Relative 6  3 - 12 %   Monocytes Absolute 0.8  0.1 - 1.0 K/uL   Eosinophils Relative 1  0 - 5 %   Eosinophils Absolute 0.1  0.0 - 0.7 K/uL   Basophils Relative 0  0 - 1 %   Basophils Absolute 0.0  0.0 - 0.1 K/uL  TROPONIN I      Result Value Range   Troponin I <0.30  <0.30 ng/mL  PRO B NATRIURETIC PEPTIDE      Result Value Range   Pro B Natriuretic peptide (BNP) 929.0 (*) 0 - 125 pg/mL  COMPREHENSIVE METABOLIC PANEL      Result Value Range   Sodium 134 (*) 135 - 145 mEq/L   Potassium 3.7  3.5 - 5.1 mEq/L   Chloride 92 (*) 96 - 112 mEq/L   CO2 32  19 - 32 mEq/L   Glucose, Bld 212 (*) 70 - 99 mg/dL   BUN 22  6 - 23 mg/dL   Creatinine, Ser 1.61  0.50 - 1.35 mg/dL   Calcium 8.6  8.4 - 09.6 mg/dL   Total Protein 8.1  6.0 - 8.3 g/dL   Albumin 3.7  3.5 - 5.2 g/dL   AST 35  0 - 37 U/L   ALT 52  0 - 53 U/L   Alkaline Phosphatase 106  39 - 117 U/L   Total Bilirubin 0.5  0.3 - 1.2 mg/dL   GFR calc non Af Amer 65 (*) >90 mL/min   GFR calc Af Amer 76 (*) >90 mL/min  MAGNESIUM      Result Value Range   Magnesium 2.0  1.5 - 2.5 mg/dL  PROTIME-INR      Result  Value Range   Prothrombin Time 13.6  11.6 - 15.2 seconds   INR 1.05  0.00 - 1.49  APTT      Result Value Range   aPTT 31  24 - 37 seconds   Dg Chest Portable 1 View  12/20/2012  *RADIOLOGY REPORT*  Clinical Data: Shortness of breath.  PORTABLE CHEST - 1 VIEW  Comparison: None.  Findings: Diffuse interstitial edema/CHF present with associated cardiomegaly.  No large pleural effusions are identified.  IMPRESSION: CHF and cardiomegaly.   Original Report Authenticated By: Irish Lack, M.D.     11:50 AM- Recheck: Lab workup shows CHF. Doing better with BiPap Lasix, and NTG.  Call to [facility] -->   12:11 PM-Consult complete with Dr. Lendell Caprice, [facility]. Patient case explained and discussed. Dr. Lendell Caprice agrees to admit patient for further evaluation and treatment. He will be admitted to step down. Call ended at 12:13 PM   1. Acute congestive heart failure     I personally performed the services described in this documentation, which was scribed in my presence. The recorded information has been reviewed and is accurate.  Osvaldo Human, M.D.    Benjamin Cooper III, MD 12/20/12 1216     Benjamin Cooper III, MD 12/20/12 [phone]

## 2012-12-20 NOTE — ED Notes (Signed)
No urine output at present, MD made aware.

## 2012-12-20 NOTE — ED Notes (Signed)
Placed on BiPap by RT with improvement.  Respiratory rate decreased to 21 from 40 initially.  Pt states he is feeling better, tolerating BiPap well.

## 2012-12-20 NOTE — ED Notes (Addendum)
Pt is obvious respiratory distress, mild cyanosis of lips and fingertips noted with 3+ pitting edema to bilateral lower extremities without a positive history of CHF.  Patient very lethargic appearing, falling asleep during interview.  Placed on oxygen 15 liters via NRB with improvement in cyanosis in lips and fingertips.  Gradual increasing O2 saturation from 29% to 97% on 15 liters per minute, MD aware and at bedside.

## 2012-12-20 NOTE — H&P (Signed)
Hospital Admission Note Date: 12/20/2012  Patient name: Benjamin [name] Medical record number: [id number] Date of birth: [birth year] Age: 62 y.o. Gender: male PCP: Kirk Ruths, MD  Chief Complaint: Shortness of breath  History of Present Illness:  Benjamin [name] is an 62 y.o. male who presents with worsening shortness of breath over the past few days. He has had dyspnea on exertion, orthopnea. His legs and feet have been swollen. He has had paroxysmal nocturnal dyspnea. His wife also noted that his face and neck seems swollen. Also his hands. He's had no chest pain. No previous history of congestive heart failure. He has history of hypertension, which is reportedly followed by Dr. Diona Browner. Echocardiogram about a year ago showed normal ejection fraction and mild to moderate concentric hypertrophy. He had severe respiratory distress with tachypnea and hypoxia on arrival and was therefore put on BiPAP. As such, history is somewhat limited. His wife provides some of the history. He has had no recent change in medications, nor diet. Echocardiogram and troponin are unremarkable, but chest x-ray shows pulmonary edema. Patient reports that he feels a little better since being put on BiPAP. He received Lasix and has diuresis over a liter.  Past Medical History  Diagnosis Date  . Essential hypertension, benign   . Type 2 diabetes mellitus   . Mixed hyperlipidemia     Statin intolerance   insomnia Arthritis of the right shoulder Gastroesophageal reflux disease  Meds: 12/20/2012 3:34 PM Medications Prior to Admission  Medication Sig Dispense Refill  . amLODipine (NORVASC) 10 MG tablet Take 1 tablet (10 mg total) by mouth daily.  30 tablet  3  . aspirin 81 MG tablet Take 81 mg by mouth daily.      [name] CINNAMON PO Take 1 tablet by mouth daily.       . diazepam (VALIUM) 10 MG tablet Take 10 mg by mouth every 6 (six) hours as needed for anxiety.      [name] glimepiride (AMARYL) 4 MG tablet Take 4 mg by mouth  2 (two) times daily.      . hydrochlorothiazide (HYDRODIURIL) 25 MG tablet Take 1 tablet (25 mg total) by mouth daily.  30 tablet  3  . HYDROcodone-acetaminophen (NORCO/VICODIN) 5-325 MG per tablet Take 1 tablet by mouth every 6 (six) hours as needed for pain.      [name] ibuprofen (ADVIL,MOTRIN) 800 MG tablet Take 800 mg by mouth every 8 (eight) hours as needed.      [name] losartan (COZAAR) 50 MG tablet Take 1 tablet (50 mg total) by mouth daily.  30 tablet  3  . metFORMIN (GLUCOPHAGE) 500 MG tablet Take 1,000 mg by mouth 2 (two) times daily with a meal.      . metoprolol (LOPRESSOR) 100 MG tablet Take 100 mg by mouth 2 (two) times daily.      . niacin (NIASPAN) 500 MG CR tablet Take 500 mg by mouth at bedtime.      . Omega-3 Fatty Acids (FISH OIL PO) Take 1 capsule by mouth daily.       . ranitidine (ZANTAC) 150 MG tablet Take 150 mg by mouth 2 (two) times daily.      . Red Yeast Rice Extract (RED YEAST RICE PO) Take 1 capsule by mouth daily.       Allergies: Niacin-lovastatin er and Zocor History   Social History  . Marital Status: Married    Spouse Name: N/A    Number of Children: N/A  .  Years of Education: N/A   Occupational History  . Electrician    Social History Main Topics  . Smoking status: Former Smoker    Types: Cigarettes  . Smokeless tobacco: Never Used  . Alcohol Use: Yes     Comment: occ  . Drug Use: No  . Sexually Active: Not on file   Other Topics Concern  . Not on file   Social History Narrative  . No narrative on file   Family History  Problem Relation Age of Onset  . Diabetes type II Mother    mother had congestive heart failure  Past Surgical History  Procedure Laterality Date  . Appendectomy      Review of Systems: Systems reviewed and as per HPI, otherwise negative.  Physical Exam: Blood pressure 140/68, pulse 62, temperature 99 F (37.2 C), temperature source Oral, resp. rate 18, height 6\' 2"  (1.88 m), weight 122.471 kg (270 lb), SpO2 94.00%. BP  153/73  Pulse 60  Temp(Src) 98.8 F (37.1 C) (Oral)  Resp 19  Ht 6\' 2"  (1.88 m)  Wt 126.7 kg (279 lb 5.2 oz)  BMI 35.85 kg/m2  SpO2 99%  General Appearance:    Alert, cooperative, appears fairly comfortable on BiPAP.   Head:    Normocephalic, without obvious abnormality, atraumatic  Eyes:    PERRL, conjunctiva/corneas clear, EOM's intact, fundi    benign, both eyes. PeriOrbital and facial swelling noted   Ears:    Normal TM's and external ear canals, both ears  Nose:   no drainage   Throat:  moist mucous membranes.   Neck:   Supple, thick, difficult to assess JVD   Back:     Symmetric, no curvature, ROM normal, no CVA tenderness  Lungs:     difficult to auscultate due to BiPAP but reportedly had tachypnea, labored breathing and rales on exam prior to being on BiPAP.   Chest wall:    No tenderness or deformity  Heart:    Regular rate and rhythm, S1 and S2 normal, no murmur, rub   or gallop  Abdomen:     Soft, non-tender, bowel sounds active all four quadrants,    no masses, no organomegaly  Genitalia:   deferred   Rectal:   deferred   Extremities:   Extremities normal, atraumatic, no cyanosis 2+ pitting edema   Pulses:   2+ and symmetric all extremities  Skin:   Skin color, texture, turgor normal, no rashes or lesions  Lymph nodes:   Cervical, supraclavicular, and axillary nodes normal  Neurologic:   CNII-XII intact. Normal strength, sensation and reflexes      throughout    Psychiatric: Normal affect.  Lab results: Basic Metabolic Panel:  Recent Labs  96/04/54 1045  NA 134*  K 3.7  CL 92*  CO2 32  GLUCOSE 212*  BUN 22  CREATININE 1.16  CALCIUM 8.6  MG 2.0   Liver Function Tests:  Recent Labs  12/20/12 1045  AST 35  ALT 52  ALKPHOS 106  BILITOT 0.5  PROT 8.1  ALBUMIN 3.7   No results found for this basename: LIPASE, AMYLASE,  in the last 72 hours No results found for this basename: AMMONIA,  in the last 72 hours CBC:  Recent Labs  12/20/12 1045  WBC  13.4*  NEUTROABS 10.8*  HGB 13.7  HCT 42.1  MCV 92.7  PLT 239   Cardiac Enzymes:  Recent Labs  12/20/12 1045  TROPONINI <0.30   BNP:  Recent Labs  12/20/12 1045  PROBNP 929.0*     Recent Labs  12/20/12 1045  LABPROT 13.6  INR 1.05   EKG:  Sinus rhythm with 1st degree A-V block with Premature atrial complexes Incomplete right bundle branch block   Imaging results:  Dg Chest Portable 1 View  12/20/2012  *RADIOLOGY REPORT*  Clinical Data: Shortness of breath.  PORTABLE CHEST - 1 VIEW  Comparison: None.  Findings: Diffuse interstitial edema/CHF present with associated cardiomegaly.  No large pleural effusions are identified.  IMPRESSION: CHF and cardiomegaly.   Original Report Authenticated By: Irish Lack, M.D.     Assessment & Plan: Principal Problem:   Acute respiratory failure Active Problems:   Acute CHF   Essential hypertension, benign   Type II or unspecified type diabetes mellitus without mention of complication, uncontrolled   Mixed hyperlipidemia   Obesity  Patient will be monitored and step down. Continue BiPAP for now. Continue IV Lasix, nitroglycerin. Check echocardiogram. TSH. Repeat troponin. Blood glucose is elevated. We'll check hemoglobin A1c, continue outpatient medications and monitor CBGs. Consider cardiology evaluation on Monday, as patient is known to Dr. Diona Browner.  Sanoe Hazan L 12/20/2012, 1:34 PM

## 2012-12-21 ENCOUNTER — Inpatient Hospital Stay (HOSPITAL_COMMUNITY): Payer: 59

## 2012-12-21 LAB — CBC
MCH: 29.7 pg (ref 26.0–34.0)
MCV: 92.9 fL (ref 78.0–100.0)
Platelets: 199 10*3/uL (ref 150–400)
RDW: 14.2 % (ref 11.5–15.5)
WBC: 13 10*3/uL — ABNORMAL HIGH (ref 4.0–10.5)

## 2012-12-21 LAB — GLUCOSE, CAPILLARY: Glucose-Capillary: 118 mg/dL — ABNORMAL HIGH (ref 70–99)

## 2012-12-21 LAB — BASIC METABOLIC PANEL
CO2: 42 mEq/L (ref 19–32)
Calcium: 8.5 mg/dL (ref 8.4–10.5)
Creatinine, Ser: 1.12 mg/dL (ref 0.50–1.35)

## 2012-12-21 MED ORDER — POTASSIUM CHLORIDE CRYS ER 20 MEQ PO TBCR
40.0000 meq | EXTENDED_RELEASE_TABLET | Freq: Two times a day (BID) | ORAL | Status: DC
  Administered 2012-12-21 – 2012-12-23 (×5): 40 meq via ORAL
  Filled 2012-12-21 (×5): qty 2

## 2012-12-21 MED ORDER — POTASSIUM CHLORIDE CRYS ER 20 MEQ PO TBCR: 40.0000 meq | EXTENDED_RELEASE_TABLET | Freq: Every day | ORAL | Status: DC

## 2012-12-21 MED ORDER — FUROSEMIDE 10 MG/ML IJ SOLN
60.0000 mg | Freq: Two times a day (BID) | INTRAMUSCULAR | Status: DC
  Administered 2012-12-21 – 2012-12-23 (×5): 60 mg via INTRAVENOUS
  Filled 2012-12-21: qty 6
  Filled 2012-12-21: qty 2
  Filled 2012-12-21 (×3): qty 6

## 2012-12-21 NOTE — Progress Notes (Signed)
     Subjective: This man was admitted with congestive heart failure with pulmonary edema. He feels improved but still is requiring high flow supplemental oxygen. Serial cardiac enzymes are negative. In fact, he describes approximately one month history of dyspnea on exertion and more acutely in the last week has become much worse with PND, orthopnea, peripheral edema and of course dyspnea.           Physical Exam: Blood pressure 144/58, pulse 58, temperature 97.8 F (36.6 C), temperature source Axillary, resp. rate 16, height 6\' 2"  (1.88 m), weight 124.8 kg (275 lb 2.2 oz), SpO2 92.00%. He looks systemically well. Lung fields show bilateral inspiratory crackles in the lower zones. He does not have a tachycardia and is in sinus rhythm. There is no significant peripheral pitting edema in his legs. He is alert and orientated.   Investigations:  Recent Results (from the past 240 hour(s))  MRSA PCR SCREENING     Status: None   Collection Time    12/20/12  3:19 PM      Result Value Range Status   MRSA by PCR NEGATIVE  NEGATIVE Final   Comment:            The GeneXpert MRSA Assay (FDA     approved for NASAL specimens     only), is one component of a     comprehensive MRSA colonization     surveillance program. It is not     intended to diagnose MRSA     infection nor to guide or     monitor treatment for     MRSA infections.     Basic Metabolic Panel:  Recent Labs  09/81/19 1045 12/21/12 0503  NA 134* 140  K 3.7 3.0*  CL 92* 92*  CO2 32 42*  GLUCOSE 212* 95  BUN 22 18  CREATININE 1.16 1.12  CALCIUM 8.6 8.5  MG 2.0  --    Liver Function Tests:  Recent Labs  12/20/12 1045  AST 35  ALT 52  ALKPHOS 106  BILITOT 0.5  PROT 8.1  ALBUMIN 3.7     CBC:  Recent Labs  12/20/12 1045 12/21/12 0503  WBC 13.4* 13.0*  NEUTROABS 10.8*  --   HGB 13.7 12.9*  HCT 42.1 40.3  MCV 92.7 92.9  PLT 239 199    Dg Chest Portable 1 View  12/20/2012  *RADIOLOGY REPORT*   Clinical Data: Shortness of breath.  PORTABLE CHEST - 1 VIEW  Comparison: None.  Findings: Diffuse interstitial edema/CHF present with associated cardiomegaly.  No large pleural effusions are identified.  IMPRESSION: CHF and cardiomegaly.   Original Report Authenticated By: Irish Lack, M.D.       Medications: I have reviewed the patient's current medications.  Impression: 1. Acute congestive heart failure. Previous echocardiogram showed normal ejection fraction. Echocardiogram ordered on this admission. Patient has been compliant with all medications. Serial cardiac enzymes are negative. 2. Hypertension, controlled. 3. Type 2 diabetes mellitus, reasonable control with hemoglobin A1c of 6.9%, could be improved. 4. Obesity. 5. Hypokalemia.     Plan: 1. Increase Lasix to 60 mg twice a day. 2. Replete potassium. 3. Await echocardiogram. Chest x-ray today. 4. Cardiology consultation tomorrow.     LOS: 1 day   Wilson Singer Pager [phone]  12/21/2012, 7:25 AM

## 2012-12-22 DIAGNOSIS — G4730 Sleep apnea, unspecified: Secondary | ICD-10-CM

## 2012-12-22 DIAGNOSIS — I517 Cardiomegaly: Secondary | ICD-10-CM

## 2012-12-22 DIAGNOSIS — R0602 Shortness of breath: Secondary | ICD-10-CM

## 2012-12-22 LAB — GLUCOSE, CAPILLARY
Glucose-Capillary: 144 mg/dL — ABNORMAL HIGH (ref 70–99)
Glucose-Capillary: 142 mg/dL — ABNORMAL HIGH (ref 70–99)
Glucose-Capillary: 154 mg/dL — ABNORMAL HIGH (ref 70–99)

## 2012-12-22 LAB — PRO B NATRIURETIC PEPTIDE: Pro B Natriuretic peptide (BNP): 448.2 pg/mL — ABNORMAL HIGH (ref 0–125)

## 2012-12-22 LAB — BASIC METABOLIC PANEL
CO2: 41 mEq/L (ref 19–32)
Chloride: 91 mEq/L — ABNORMAL LOW (ref 96–112)
Sodium: 139 mEq/L (ref 135–145)

## 2012-12-22 MED ORDER — LISINOPRIL 5 MG PO TABS
5.0000 mg | ORAL_TABLET | Freq: Every day | ORAL | Status: DC
  Administered 2012-12-22: 5 mg via ORAL
  Filled 2012-12-22: qty 1

## 2012-12-22 NOTE — Progress Notes (Signed)
*  PRELIMINARY RESULTS* Echocardiogram 2D Echocardiogram has been performed.  Conrad  12/22/2012, 10:22 AM

## 2012-12-22 NOTE — Progress Notes (Signed)
Subjective: This man was admitted with congestive heart failure with pulmonary edema. He feels much improved. He is now requiring 6 L oxygen per minute. His blood pressure is tending to be on the high end.           Physical Exam: Blood pressure 143/69, pulse 62, temperature 97.7 F (36.5 C), temperature source Oral, resp. rate 13, height 6\' 2"  (1.88 m), weight 124.8 kg (275 lb 2.2 oz), SpO2 98.00%. He looks systemically well. Lung fields show bilateral inspiratory crackles in the lower zones, less than yesterday. He does not have a tachycardia and is in sinus rhythm. There is no significant peripheral pitting edema in his legs. He is alert and orientated.   Investigations:  Recent Results (from the past 240 hour(s))  MRSA PCR SCREENING     Status: None   Collection Time    12/20/12  3:19 PM      Result Value Range Status   MRSA by PCR NEGATIVE  NEGATIVE Final   Comment:            The GeneXpert MRSA Assay (FDA     approved for NASAL specimens     only), is one component of a     comprehensive MRSA colonization     surveillance program. It is not     intended to diagnose MRSA     infection nor to guide or     monitor treatment for     MRSA infections.     Basic Metabolic Panel:  Recent Labs  14/78/29 1045 12/21/12 0503 12/22/12 0508  NA 134* 140 139  K 3.7 3.0* 3.2*  CL 92* 92* 91*  CO2 32 42* 41*  GLUCOSE 212* 95 96  BUN 22 18 19   CREATININE 1.16 1.12 1.17  CALCIUM 8.6 8.5 8.9  MG 2.0  --   --    Liver Function Tests:  Recent Labs  12/20/12 1045  AST 35  ALT 52  ALKPHOS 106  BILITOT 0.5  PROT 8.1  ALBUMIN 3.7     CBC:  Recent Labs  12/20/12 1045 12/21/12 0503  WBC 13.4* 13.0*  NEUTROABS 10.8*  --   HGB 13.7 12.9*  HCT 42.1 40.3  MCV 92.7 92.9  PLT 239 199    Dg Chest Port 1 View  12/21/2012  *RADIOLOGY REPORT*  Clinical Data: CHF.  PORTABLE CHEST - 1 VIEW  Comparison: 12/20/2012  Findings: Degree of congestive heart failure  shows slight improvement.  There is stable cardiomegaly.  No significant pleural fluid identified.  IMPRESSION: Improving CHF.   Original Report Authenticated By: Irish Lack, M.D.    Dg Chest Portable 1 View  12/20/2012  *RADIOLOGY REPORT*  Clinical Data: Shortness of breath.  PORTABLE CHEST - 1 VIEW  Comparison: None.  Findings: Diffuse interstitial edema/CHF present with associated cardiomegaly.  No large pleural effusions are identified.  IMPRESSION: CHF and cardiomegaly.   Original Report Authenticated By: Irish Lack, M.D.       Medications: I have reviewed the patient's current medications.  Impression: 1. Acute congestive heart failure, improving clinically. Previous echocardiogram showed normal ejection fraction. Echocardiogram ordered on this admission. Patient has been compliant with all medications. Serial cardiac enzymes are negative. 2. Hypertension, uncontrolled. 3. Type 2 diabetes mellitus, reasonable control with hemoglobin A1c of 6.9%, could be improved. 4. Obesity. 5. Hypokalemia.     Plan: 1. Increase Lasix to 60 mg twice a day. 2. Replete potassium. Start lisinopril 5 mg daily.  3. Await echocardiogram. 4. Cardiology consultation today. 5. Patient can transfer to the medical floor under telemetry. Wean off oxygen as tolerated.     LOS: 2 days   Wilson Singer Pager [phone]  12/22/2012, 7:39 AM

## 2012-12-22 NOTE — Consult Note (Signed)
CARDIOLOGY CONSULT NOTE  Patient ID: Benjamin [name] MRN: [id number] DOB/AGE: October 27, 1950 62 y.o.  Admit date: 12/20/2012 Referring Physician: PTH-Gosrani Primary PhysicianMCGOUGH,WILLIAM M, MD Primary Cardiologist: McDowell,Samuel MD Reason for Consultation: Diastolic CHF Principal Problem:   Acute respiratory failure Active Problems:   Essential hypertension, benign   Mixed hyperlipidemia   Type II or unspecified type diabetes mellitus without mention of complication, uncontrolled   Acute CHF   Obesity  HPI: Benjamin [name] is a 62 year old patient admitted with severe respiratory distress and acute pulmonary edema. He has a history of hypertension, with cardiovascular risk factors also to include hyperlipidemia and diabetes.He  has been seen in the past by Dr. Simona Huh. He has been lost to followup for one year secondary to financial constraints. The patient states he was in his usual state of health until approximately 2 weeks ago when he began to have increasing dyspnea on exertion and fluid retention. Symptoms became progressive with severe symptoms on day of admission. He presented to the emergency room and was found to be mildly hypertensive with a blood pressure 154/74 heart rate of 66 and O2 sat of 96% proBNP was elevated at 929 0. cardiac enzymes have been negative. Due to abnormal chest x-ray demonstrating pulmonary edema, the patient was given IV Lasix 80 mg and has begun to diuresis very well. He continues on IV diuretics, averaging 5000 cc diuresis each day since admission. He is feeling and breathing better. He denies medical noncompliance, but does admit to dietary noncompliance with heavy salt intake. Echocardiogram is in process. Prior echocardiogram dated February 2013 demonstrated normal ejection fraction of 60-65% with mild concentric ventricular hypertrophy.  Review of systems complete and found to be negative unless listed above   Past Medical History  Diagnosis Date  .  Essential hypertension, benign   . Type 2 diabetes mellitus   . Mixed hyperlipidemia     Statin intolerance    Family History  Problem Relation Age of Onset  . Diabetes type II Mother     History   Social History  . Marital Status: Married    Spouse Name: N/A    Number of Children: N/A  . Years of Education: N/A   Occupational History  . Electrician    Social History Main Topics  . Smoking status: Former Smoker    Types: Cigarettes  . Smokeless tobacco: Never Used  . Alcohol Use: Yes     Comment: occ  . Drug Use: No  . Sexually Active: Not on file   Other Topics Concern  . Not on file   Social History Narrative  . No narrative on file    Past Surgical History  Procedure Laterality Date  . Appendectomy       Prescriptions prior to admission  Medication Sig Dispense Refill  . amLODipine (NORVASC) 10 MG tablet Take 1 tablet (10 mg total) by mouth daily.  30 tablet  3  . aspirin 81 MG tablet Take 81 mg by mouth daily.      [name] CINNAMON PO Take 1 tablet by mouth daily.       . diazepam (VALIUM) 10 MG tablet Take 10 mg by mouth every 6 (six) hours as needed for anxiety.      [name] glimepiride (AMARYL) 4 MG tablet Take 4 mg by mouth 2 (two) times daily.      . hydrochlorothiazide (HYDRODIURIL) 25 MG tablet Take 1 tablet (25 mg total) by mouth daily.  30 tablet  3  .  HYDROcodone-acetaminophen (NORCO/VICODIN) 5-325 MG per tablet Take 1 tablet by mouth every 6 (six) hours as needed for pain.      [name] ibuprofen (ADVIL,MOTRIN) 800 MG tablet Take 800 mg by mouth every 8 (eight) hours as needed.      [name] losartan (COZAAR) 50 MG tablet Take 1 tablet (50 mg total) by mouth daily.  30 tablet  3  . metFORMIN (GLUCOPHAGE) 500 MG tablet Take 1,000 mg by mouth 2 (two) times daily with a meal.      . metoprolol (LOPRESSOR) 100 MG tablet Take 100 mg by mouth 2 (two) times daily.      . niacin (NIASPAN) 500 MG CR tablet Take 500 mg by mouth at bedtime.      . Omega-3 Fatty Acids (FISH OIL PO)  Take 1 capsule by mouth daily.       . ranitidine (ZANTAC) 150 MG tablet Take 150 mg by mouth 2 (two) times daily.      . Red Yeast Rice Extract (RED YEAST RICE PO) Take 1 capsule by mouth daily.         Physical Exam: Blood pressure 143/69, pulse 62, temperature 97.7 F (36.5 C), temperature source Oral, resp. rate 13, height 6\' 2"  (1.88 m), weight 275 lb 2.2 oz (124.8 kg), SpO2 98.00%.  General: Well developed, well nourished, in no acute distress Head: Eyes PERRLA, No xanthomas.   Normal cephalic and atramatic  Lungs: Clear bilaterally to auscultation and percussion. Heart: HRRR S1 S2, without MRG.  Pulses are 2+ & equal.            No carotid bruit. No JVD.  No abdominal bruits. No femoral bruits. Abdomen: Bowel sounds are positive, abdomen soft and non-tender without masses or                  Hernia's noted. Msk:  Back normal, normal gait. Normal strength and tone for age. Extremities: No clubbing, cyanosis or edema.  DP +1 Neuro: Alert and oriented X 3. Psych:  Good affect, responds appropriately  Labs:   Lab Results  Component Value Date   WBC 13.0* 12/21/2012   HGB 12.9* 12/21/2012   HCT 40.3 12/21/2012   MCV 92.9 12/21/2012   PLT 199 12/21/2012    Recent Labs Lab 12/20/12 1045  12/22/12 0508  NA 134*  < > 139  K 3.7  < > 3.2*  CL 92*  < > 91*  CO2 32  < > 41*  BUN 22  < > 19  CREATININE 1.16  < > 1.17  CALCIUM 8.6  < > 8.9  PROT 8.1  --   --   BILITOT 0.5  --   --   ALKPHOS 106  --   --   ALT 52  --   --   AST 35  --   --   GLUCOSE 212*  < > 96  < > = values in this interval not displayed. Lab Results  Component Value Date   TROPONINI <0.30 12/20/2012      BNP (last 3 results)  Recent Labs  12/20/12 1045 12/22/12 0508  PROBNP 929.0* 448.2*      Radiology: Dg Chest Port 1 View  12/21/2012  *RADIOLOGY REPORT*  Clinical Data: CHF.  PORTABLE CHEST - 1 VIEW  Comparison: 12/20/2012  Findings: Degree of congestive heart failure shows slight improvement.  There  is stable cardiomegaly.  No significant pleural fluid identified.  IMPRESSION: Improving CHF.   Original Report  Authenticated By: Irish Lack, M.D.    Dg Chest Portable 1 View  12/20/2012  *RADIOLOGY REPORT*  Clinical Data: Shortness of breath.  PORTABLE CHEST - 1 VIEW  Comparison: None.  Findings: Diffuse interstitial edema/CHF present with associated cardiomegaly.  No large pleural effusions are identified.  IMPRESSION: CHF and cardiomegaly.   Original Report Authenticated By: Irish Lack, M.D.    EKG: Normal sinus rhythm with first degree AV block, PR interval 0.23, R. Ivar Drape, with T-wave flattening noted anteriorly. Rate of 74 beats per minute.  ASSESSMENT AND PLAN:   1.Acute Pulmonary Edema: Likely related to diastolic dysfunction in the setting of long-standing hypertension. Presented with New [place] Heart Association class III symptoms. Initial cardiac enzyme was negative with EKG negative for acute coronary syndrome. Echocardiogram is in process, or comparison to prior echo one year ago. The patient has diuresed approximately 5000 cc a day over the last 2 days on IV Lasix 60 mg twice a day. Review of records does not demonstrate prior ischemic workup. . For now continue IV diuresis as he is clearly having a good response to this. Patient states his baseline weight is 270 pounds, on admission his weight was 279 pounds. He is currently at 275 pounds. Weight is not reflecting diuresis  2. Hypertension: Pressure is moderately controlled. Admission was elevated at 176/93. Continues on lisinopril 5 mg daily, losartan 50 mg daily, amlodipine,10 mg daily and metoprolol 100 mg twice a day. Review of home medications demonstrates that ACE inhibitor is new. Creatinine of 1.17 this a.m. We will continue to monitor this after diuresis for medication adjustments as needed.  3. Hypokalemia: This a.m. potassium was found to be 3.2, only slightly improved from 3.0 yesterday. He is receiving potassium  40 mEq twice a day, we will add an additional 40 mEq this morning in addition to his current doses. Followup be met in a.m.  4. Diabetes: Hgb A1c 6.9% on admission.  5. Obesity: BMI 35.9. Weight loss and low carb diet is recommended.   Signed: Bettey Mare. Lyman Bishop NP [facility] 12/22/2012, 9:52 AM Co-Sign MD  Patient seen and examined  Agree with findings of K Lawrence above  I have amended the H and P Patient with diastolic CHF  Diuresing with IV lasix.  Echo today with normal systolic function, moderate diastolic dysfunction.   Would continue current regimen.  Replete K. Will need f/u as outpatient Instructed patient on daily wts, salt restriction.    Counselled on wt loss.

## 2012-12-22 NOTE — Progress Notes (Signed)
Utilization Review Complete  

## 2012-12-22 NOTE — Progress Notes (Signed)
Report called and given to M. Rubye Oaks, RN. Patient being transferred to dept 300. Patient alert, oriented and in stable condition at the time of transport. Patient being transported by NT in wheelchair on Tele and O2. Patient's belongings being transported with patient. Wife at patient's side during transport.

## 2012-12-22 NOTE — Care Management Note (Signed)
    Page 1 of 1   12/23/2012     1:00:00 PM   CARE MANAGEMENT NOTE 12/23/2012  Patient:  Benjamin [name], Benjamin [name]   Account Number:  [id number]  Date Initiated:  12/22/2012  Documentation initiated by:  Rosemary Holms  Subjective/Objective Assessment:   Admitted from home with spouse with CHF and acute respiratory failure. New onset for CHF and pt agrees to [facility] RN for heart failure program.     Action/Plan:   DC home w/ HH RN   Anticipated DC Date:  12/23/2012   Anticipated DC Plan:  HOME W HOME HEALTH SERVICES      DC Planning Services  CM consult      Choice offered to / List presented to:          [facility] arranged  HH-1 RN  HH-10 DISEASE MANAGEMENT      HH agency  [facility]   Status of service:  Completed, signed off Medicare Important Message given?  NA - LOS <3 / Initial given by admissions (If response is "NO", the following Medicare IM given date fields will be blank) Date Medicare IM given:   Date Additional Medicare IM given:    Discharge Disposition:  HOME W HOME HEALTH SERVICES  Per UR Regulation:    If discussed at Long Length of Stay Meetings, dates discussed:    Comments:  12/22/12 Rosemary Holms RN BSN CM

## 2012-12-23 DIAGNOSIS — I5030 Unspecified diastolic (congestive) heart failure: Secondary | ICD-10-CM

## 2012-12-23 LAB — BASIC METABOLIC PANEL
BUN: 25 mg/dL — ABNORMAL HIGH (ref 6–23)
Calcium: 9.4 mg/dL (ref 8.4–10.5)
GFR calc non Af Amer: 53 mL/min — ABNORMAL LOW (ref >90)
Glucose, Bld: 131 mg/dL — ABNORMAL HIGH (ref 70–99)

## 2012-12-23 MED ORDER — LOSARTAN POTASSIUM 100 MG PO TABS: 100.0000 mg | ORAL_TABLET | Freq: Every day | ORAL | Status: DC

## 2012-12-23 MED ORDER — FUROSEMIDE 40 MG PO TABS: 40.0000 mg | ORAL_TABLET | Freq: Every day | ORAL | Status: DC

## 2012-12-23 MED ORDER — LOSARTAN POTASSIUM 50 MG PO TABS: 100.0000 mg | ORAL_TABLET | Freq: Every day | ORAL | Status: DC

## 2012-12-23 MED ORDER — POTASSIUM CHLORIDE CRYS ER 20 MEQ PO TBCR: 40.0000 meq | EXTENDED_RELEASE_TABLET | Freq: Once | ORAL | Status: DC

## 2012-12-23 MED ORDER — AMLODIPINE BESYLATE 10 MG PO TABS: 10.0000 mg | ORAL_TABLET | Freq: Every day | ORAL | Status: DC

## 2012-12-23 MED ORDER — POTASSIUM CHLORIDE CRYS ER 20 MEQ PO TBCR: 40.0000 meq | EXTENDED_RELEASE_TABLET | Freq: Every day | ORAL | Status: DC

## 2012-12-23 NOTE — Progress Notes (Signed)
IV removed, site WNL.  Pt given d/c instructions and new prescriptions.  Discussed home care with patient and discussed home medications (new medications, modified meds and meds to  Continue), patient and his wife/family verbalize understanding, teachback completed. F/U appointment in place with [facility], pt states they will keep appointment. Pt is stable at this time. Home Health has been arranged.  Pt given Living with Heart Failure packet and was discussed with pt and family.  Stressed importance of daily weights.  Unit 300 and ICU were out of scales to send home with pt's at D/C, notified appropriate staff member and will have pt's family member come pick up a scale tomorrow. Pt taken to main entrance in wheelchair by staff member.

## 2012-12-23 NOTE — Discharge Summary (Signed)
Physician Discharge Summary  Benjamin [name] WUJ:[id number] DOB: 1951-07-24 DOA: 12/20/2012  PCP: Kirk Ruths, MD  Admit date: 12/20/2012 Discharge date: 12/23/2012  Time spent: Greater than 30 minutes  Recommendations for Outpatient Follow-up:  1. Followup with Dr. Diona Browner, cardiology in the next week or so.  Discharge Diagnoses: 1. Acute diastolic congestive heart failure, improved. 2. Hypertension, initially uncontrolled, now improved. 3. Type 2 diabetes mellitus. 4. Obesity.    Discharge Condition: Stable and improved.  Diet recommendation: Low salt diet. Carbohydrate modified diet.  Filed Weights   12/20/12 1350 12/21/12 0500 12/23/12 0321  Weight: 126.7 kg (279 lb 5.2 oz) 124.8 kg (275 lb 2.2 oz) 116.302 kg (256 lb 6.4 oz)    History of present illness:  This 62 year old man presented to the hospital with dyspnea. Please see initial history as outlined below the Benjamin [name] is an 62 y.o. male who presents with worsening shortness of breath over the past few days. He has had dyspnea on exertion, orthopnea. His legs and feet have been swollen. He has had paroxysmal nocturnal dyspnea. His wife also noted that his face and neck seems swollen. Also his hands. He's had no chest pain. No previous history of congestive heart failure. He has history of hypertension, which is reportedly followed by Dr. Diona Browner. Echocardiogram about a year ago showed normal ejection fraction and mild to moderate concentric hypertrophy. He had severe respiratory distress with tachypnea and hypoxia on arrival and was therefore put on BiPAP. As such, history is somewhat limited. His wife provides some of the history. He has had no recent change in medications, nor diet. Echocardiogram and troponin are unremarkable, but chest x-ray shows pulmonary edema. Patient reports that he feels a little better since being put on BiPAP. He received Lasix and has diuresis over a liter.  Hospital Course:  Patient was  admitted and was severely short of breath and had to be placed on BiPAP for a short while. He was given intravenous Lasix and had an extremely good diuresis, diuresing approximately 3-5 L per day!. His breathing has tremendously improved. His chest x-ray has improved and this morning his lung fields are totally clear. He did have some mild confusion yesterday when he changed his room. He did not have any chest pain throughout. Serial cardiac enzymes were negative. Echocardiogram showed severe LVH and diastolic dysfunction. There was no evidence of regional wall hypokinesis. He was seen by cardiology, Dr. Tenny Craw, who suggested further outpatient management.  Procedures:  Echocardiogram:  Study Conclusions  - Left ventricle: The cavity size was normal. Wall thickness was increased in a pattern of severe LVH. Systolic function was normal. The estimated ejection fraction was in the range of 60% to 65%. Features are consistent with a pseudonormal left ventricular filling pattern, with concomitant abnormal relaxation and increased filling pressure (grade 2 diastolic dysfunction). - Aorta: Aortic root is minimally dilated ate 40 mm. Ascending aorta is difficult to see. May be mildly dilated. Consider CT to evaluate further. - Left atrium: The atrium was mildly dilated. Transthoracic echocardiography. M-mode, complete 2D, spectral Doppler, and color Doppler. Height: Height: 188cm. Height: 74in. Weight: Weight: 124.7kg. Weight: 274.4lb. Body mass index: BMI: 35.3kg/m^2. Body surface area: BSA: 2.53m^2. Patient status: Inpatient. Location: ICU/CCU   Consultations:  Cardiology, Dr. Tenny Craw.  Discharge Exam: Filed Vitals:   12/22/12 1043 12/22/12 1045 12/22/12 2148 12/23/12 0321  BP: 158/88 158/88 123/73   Pulse:   75   Temp:  97.5 F (36.4 C) 99 F (37.2  C)   TempSrc:  Oral Oral   Resp:   20   Height:      Weight:    116.302 kg (256 lb 6.4 oz)  SpO2:  100% 93%     General: He looks  systemically well. He is alert and orientated without any focal neurological signs. Cardiovascular: Heart sounds are present and in sinus rhythm, no tachycardia. No gallop rhythm. Jugular venous pressure not raised. There are no murmurs. There is no peripheral pitting edema. Respiratory: Lung fields are entirely clear.  Discharge Instructions  Discharge Orders   Future Orders Complete By Expires     Diet - low sodium heart healthy  As directed     Increase activity slowly  As directed         Medication List    STOP taking these medications       hydrochlorothiazide 25 MG tablet  Commonly known as:  HYDRODIURIL     ibuprofen 800 MG tablet  Commonly known as:  ADVIL,MOTRIN      TAKE these medications       amLODipine 10 MG tablet  Commonly known as:  NORVASC  Take 1 tablet (10 mg total) by mouth daily.     aspirin 81 MG tablet  Take 81 mg by mouth daily.     CINNAMON PO  Take 1 tablet by mouth daily.     diazepam 10 MG tablet  Commonly known as:  VALIUM  Take 10 mg by mouth every 6 (six) hours as needed for anxiety.     FISH OIL PO  Take 1 capsule by mouth daily.     furosemide 40 MG tablet  Commonly known as:  LASIX  Take 1 tablet (40 mg total) by mouth daily.     glimepiride 4 MG tablet  Commonly known as:  AMARYL  Take 4 mg by mouth 2 (two) times daily.     HYDROcodone-acetaminophen 5-325 MG per tablet  Commonly known as:  NORCO/VICODIN  Take 1 tablet by mouth every 6 (six) hours as needed for pain.     losartan 100 MG tablet  Commonly known as:  COZAAR  Take 1 tablet (100 mg total) by mouth daily.     metFORMIN 500 MG tablet  Commonly known as:  GLUCOPHAGE  Take 1,000 mg by mouth 2 (two) times daily with a meal.     metoprolol 100 MG tablet  Commonly known as:  LOPRESSOR  Take 100 mg by mouth 2 (two) times daily.     niacin 500 MG CR tablet  Commonly known as:  NIASPAN  Take 500 mg by mouth at bedtime.     potassium chloride SA 20 MEQ tablet   Commonly known as:  K-DUR,KLOR-CON  Take 2 tablets (40 mEq total) by mouth daily.     ranitidine 150 MG tablet  Commonly known as:  ZANTAC  Take 150 mg by mouth 2 (two) times daily.     RED YEAST RICE PO  Take 1 capsule by mouth daily.          The results of significant diagnostics from this hospitalization (including imaging, microbiology, ancillary and laboratory) are listed below for reference.    Significant Diagnostic Studies: Dg Chest Port 1 View  12/21/2012  *RADIOLOGY REPORT*  Clinical Data: CHF.  PORTABLE CHEST - 1 VIEW  Comparison: 12/20/2012  Findings: Degree of congestive heart failure shows slight improvement.  There is stable cardiomegaly.  No significant pleural fluid identified.  IMPRESSION: Improving  CHF.   Original Report Authenticated By: Irish Lack, M.D.    Dg Chest Portable 1 View  12/20/2012  *RADIOLOGY REPORT*  Clinical Data: Shortness of breath.  PORTABLE CHEST - 1 VIEW  Comparison: None.  Findings: Diffuse interstitial edema/CHF present with associated cardiomegaly.  No large pleural effusions are identified.  IMPRESSION: CHF and cardiomegaly.   Original Report Authenticated By: Irish Lack, M.D.     Microbiology: Recent Results (from the past 240 hour(s))  MRSA PCR SCREENING     Status: None   Collection Time    12/20/12  3:19 PM      Result Value Range Status   MRSA by PCR NEGATIVE  NEGATIVE Final   Comment:            The GeneXpert MRSA Assay (FDA     approved for NASAL specimens     only), is one component of a     comprehensive MRSA colonization     surveillance program. It is not     intended to diagnose MRSA     infection nor to guide or     monitor treatment for     MRSA infections.     Labs: Basic Metabolic Panel:  Recent Labs Lab 12/20/12 1045 12/21/12 0503 12/22/12 0508 12/23/12 0527  NA 134* 140 139 138  K 3.7 3.0* 3.2* 3.0*  CL 92* 92* 91* 93*  CO2 32 42* 41* 35*  GLUCOSE 212* 95 96 131*  BUN 22 18 19  25*   CREATININE 1.16 1.12 1.17 1.37*  CALCIUM 8.6 8.5 8.9 9.4  MG 2.0  --   --   --    Liver Function Tests:  Recent Labs Lab 12/20/12 1045  AST 35  ALT 52  ALKPHOS 106  BILITOT 0.5  PROT 8.1  ALBUMIN 3.7     CBC:  Recent Labs Lab 12/20/12 1045 12/21/12 0503  WBC 13.4* 13.0*  NEUTROABS 10.8*  --   HGB 13.7 12.9*  HCT 42.1 40.3  MCV 92.7 92.9  PLT 239 199   Cardiac Enzymes:  Recent Labs Lab 12/20/12 1045 12/20/12 1634 12/20/12 2229  TROPONINI <0.30 <0.30 <0.30   BNP: BNP (last 3 results)  Recent Labs  12/20/12 1045 12/22/12 0508 12/23/12 0527  PROBNP 929.0* 448.2* 207.4*   CBG:  Recent Labs Lab 12/22/12 0747 12/22/12 1146 12/22/12 1639 12/22/12 2049 12/23/12 0730  GLUCAP 144* 142* 135* 154* 144*       Signed:  GOSRANI,NIMISH C  [facility] 12/23/2012, 8:12 AM

## 2012-12-25 ENCOUNTER — Telehealth: Payer: Self-pay | Admitting: Adult Health

## 2012-12-25 NOTE — Telephone Encounter (Signed)
Pt was released for hospital Tuesday, some of his medications were changed and he is having some confusion along with swelling in his feet to the point of not being able to walk. His neck is also hurting. Patient stays very thirsty.

## 2012-12-25 NOTE — Telephone Encounter (Signed)
Spoke with patient's wife, who states he has not felt well since discharge.  Sugars are running high, urine is dark, feet are swollen.  [facility] and a BMET will be drawn.Has follow up appointment tomorrow at 3:15, however advised her to take him back to the ER if he worsens to the point she feels he needs immediate attention.

## 2012-12-26 ENCOUNTER — Ambulatory Visit (INDEPENDENT_AMBULATORY_CARE_PROVIDER_SITE_OTHER): Payer: 59 | Admitting: Cardiovascular Disease

## 2012-12-26 ENCOUNTER — Encounter: Payer: 59 | Admitting: Cardiovascular Disease

## 2012-12-26 ENCOUNTER — Encounter: Payer: Self-pay | Admitting: Cardiovascular Disease

## 2012-12-26 VITALS — BP 110/56 | HR 75 | Ht 73.0 in | Wt 253.0 lb

## 2012-12-26 DIAGNOSIS — E782 Mixed hyperlipidemia: Secondary | ICD-10-CM

## 2012-12-26 DIAGNOSIS — I509 Heart failure, unspecified: Secondary | ICD-10-CM

## 2012-12-26 DIAGNOSIS — I10 Essential (primary) hypertension: Secondary | ICD-10-CM

## 2012-12-26 DIAGNOSIS — M109 Gout, unspecified: Secondary | ICD-10-CM

## 2012-12-26 NOTE — Assessment & Plan Note (Signed)
Cholesterol is at goal.  Continue current dose of statin and diet Rx.  No myalgias or side effects.  F/U  LFT's in 6 months. No results found for this basename: LDLCALC             

## 2012-12-26 NOTE — Progress Notes (Signed)
Patient ID: Benjamin [name], male   DOB: 1950/12/03, 62 y.o.   MRN: [id number] 62 yo patient of Dr Diona Browner and Tenny Craw.  Just discharged from [facility] on 4/5 with respitory failure and CHF. CHF thought to be diastolic  Echocardiogram:  Study Conclusions  - Left ventricle: The cavity size was normal. Wall thickness was increased in a pattern of severe LVH. Systolic function was normal. The estimated ejection fraction was in the range of 60% to 65%. Features are consistent with a pseudonormal left ventricular filling pattern, with concomitant abnormal relaxation and increased filling pressure (grade 2 diastolic dysfunction). - Aorta: Aortic root is minimally dilated ate 40 mm. Ascending aorta is difficult to see. May be mildly dilated. Consider CT to evaluate further. - Left atrium: The atrium was mildly dilated. Transthoracic echocardiography. M-mode, complete 2D, spectral Doppler, and color Doppler. Height: Height: 188cm. Height: 74in. Weight: Weight: 124.7kg. Weight: 274.4lb. Body mass index: BMI: 35.3kg/m^2. Body surface area: BSA: 2.35m^2. Patient status: Inpatient. Location: ICU/CCU  R/O no history of CAD  Care limited by lack of insurance, compliance and cost of meds.  He has HTN, OSA and poorly controlled DM  Discharge weight was 256 Has bad gout made worse by addition of lasix.  Pain in neck knees and ankles.  Has had gout before. Dyspnea improved.     ROS: Denies fever, malais, weight loss, blurry vision, decreased visual acuity, cough, sputum, SOB, hemoptysis, pleuritic pain, palpitaitons, heartburn, abdominal pain, melena, lower extremity edema, claudication, or rash.  All other systems reviewed and negative  General: Affect appropriate Chronically ill male HEENT: normal Neck supple with no adenopathy JVP normal no bruits no thyromegaly Lungs clear with no wheezing and good diaphragmatic motion Heart:  S1/S2 no murmur, no rub, gallop or click PMI normal Abdomen: benighn, BS  positve, no tenderness, no AAA no bruit.  No HSM or HJR Distal pulses intact with no bruits Trace bilateral edema Neuro non-focal Skin warm and dry No muscular weakness   Current Outpatient Prescriptions  Medication Sig Dispense Refill  . ranitidine (ZANTAC) 150 MG tablet Take 300 mg by mouth daily as needed.       [name] amLODipine (NORVASC) 10 MG tablet Take 1 tablet (10 mg total) by mouth daily.  30 tablet  0  . aspirin 81 MG tablet Take 81 mg by mouth daily.      [name] CINNAMON PO Take 1 tablet by mouth daily.       . diazepam (VALIUM) 10 MG tablet Take 10 mg by mouth every 6 (six) hours as needed for anxiety.      . furosemide (LASIX) 40 MG tablet Take 1 tablet (40 mg total) by mouth daily.  30 tablet  0  . glimepiride (AMARYL) 4 MG tablet Take 4 mg by mouth 2 (two) times daily.      [name] HYDROcodone-acetaminophen (NORCO/VICODIN) 5-325 MG per tablet Take 1 tablet by mouth every 6 (six) hours as needed for pain.      [name] losartan (COZAAR) 100 MG tablet Take 1 tablet (100 mg total) by mouth daily.  30 tablet  0  . metFORMIN (GLUCOPHAGE) 500 MG tablet Take 1,000 mg by mouth 2 (two) times daily with a meal.      . metoprolol (LOPRESSOR) 100 MG tablet Take 100 mg by mouth 2 (two) times daily.      . niacin (NIASPAN) 500 MG CR tablet Take 500 mg by mouth at bedtime.      . Omega-3 Fatty Acids (  FISH OIL PO) Take 1 capsule by mouth daily.       . potassium chloride SA (K-DUR,KLOR-CON) 20 MEQ tablet Take 2 tablets (40 mEq total) by mouth daily.  60 tablet  0  . Red Yeast Rice Extract (RED YEAST RICE PO) Take 1 capsule by mouth daily.        No current facility-administered medications for this visit.    Allergies  Niacin-lovastatin er and Zocor  Electrocardiogram:  12/24/12  SR rate 76 PR 226  ICRBBB no ischemic changes  Assessment and Plan

## 2012-12-26 NOTE — Assessment & Plan Note (Signed)
Resolved Diastolic with severe LVH  Continue current dose of lasix.  BMET today

## 2012-12-26 NOTE — Addendum Note (Signed)
Addended by: Cynda Acres A on: 12/26/2012 10:16 AM   Modules accepted: Orders

## 2012-12-26 NOTE — Assessment & Plan Note (Signed)
Discussed with Dr Regino Schultze  Would start colchicine and tramadol  Try to avoid prednisone as his BS is already poorly controlled. Dr Regino Schultze to see today

## 2012-12-26 NOTE — Patient Instructions (Addendum)
Your physician recommends that you schedule a follow-up appointment in: 3 weeks with KL   YOUR PHYSICIAN SPOKE TO YOUR PCP [facility] AND WAS ADVISED PCP OFFICE WILL CALL YOU TODAY TO ADDRESS CURRENT CONCERNS/SXS TODAY WITH OFFICE VISIT AND OR MEDICATION ADDITIONS, IF YOU DO NOT HEAR FROM YOUR PCP OFFICE BY THIS AFTERNOON PLEASE CALL THEM AT [phone]

## 2012-12-26 NOTE — Assessment & Plan Note (Signed)
Poor control  Personally spoke with Dr Regino Schultze regarding being seen today to adjust meds.

## 2012-12-26 NOTE — Assessment & Plan Note (Signed)
Well controlled.  Continue current medications and low sodium Dash type diet.    

## 2012-12-30 ENCOUNTER — Encounter: Payer: 59 | Admitting: Adult Health

## 2013-01-16 ENCOUNTER — Ambulatory Visit (INDEPENDENT_AMBULATORY_CARE_PROVIDER_SITE_OTHER): Payer: 59 | Admitting: Adult Health

## 2013-01-16 ENCOUNTER — Encounter: Payer: Self-pay | Admitting: Adult Health

## 2013-01-16 VITALS — BP 130/84 | HR 62 | Ht 73.0 in | Wt 250.8 lb

## 2013-01-16 DIAGNOSIS — I10 Essential (primary) hypertension: Secondary | ICD-10-CM

## 2013-01-16 DIAGNOSIS — I509 Heart failure, unspecified: Secondary | ICD-10-CM

## 2013-01-16 DIAGNOSIS — E876 Hypokalemia: Secondary | ICD-10-CM

## 2013-01-16 DIAGNOSIS — G4730 Sleep apnea, unspecified: Secondary | ICD-10-CM

## 2013-01-16 DIAGNOSIS — E669 Obesity, unspecified: Secondary | ICD-10-CM

## 2013-01-16 NOTE — Progress Notes (Signed)
HPI: Benjamin [name] is a 62 year old patient of Dr. Diona Browner, or following for ongoing assessment and treatment of hypertension, chronic diastolic CHF, and mixed hyperlipidemia. Other history includes diabetes which has been difficult to control. The patient was last seen by Dr. Charlton Haws on 12/26/2012 with no changes to his medications. The patient was started on colchicine and tramadol by his PCP. He was continued on other medications.  Followup BMET was obtained demonstrating a sodium of 138 potassium of 3.0, creatinine of 1.37 glucose of 131. Hypokalemia was not addressed at this time a review of labs by Dr. Eden Emms but the patient remains on 40 mEq of potassium daily. Most recent echocardiogram completed on 12/20/2012 demonstrated LVEF of 60-65% with grade 2 diastolic dysfunction.    Allergies  Allergen Reactions  . Niacin-Lovastatin Er Other (See Comments)    Body aches  . Zocor (Simvastatin - High Dose) Other (See Comments)    Body aches.    Current Outpatient Prescriptions  Medication Sig Dispense Refill  . allopurinol (ZYLOPRIM) 100 MG tablet Take 100 mg by mouth daily.      [name] amLODipine (NORVASC) 10 MG tablet Take 1 tablet (10 mg total) by mouth daily.  30 tablet  0  . diazepam (VALIUM) 10 MG tablet Take 10 mg by mouth every 6 (six) hours as needed for anxiety.      . furosemide (LASIX) 40 MG tablet Take 20 mg by mouth daily.      [name] glimepiride (AMARYL) 4 MG tablet Take 4 mg by mouth 2 (two) times daily.      [name] HYDROcodone-acetaminophen (NORCO/VICODIN) 5-325 MG per tablet Take 1 tablet by mouth every 6 (six) hours as needed for pain.      [name] HYDROmorphone (DILAUDID) 4 MG tablet Take 4 mg by mouth every 4 (four) hours as needed for pain.      [name] losartan (COZAAR) 100 MG tablet Take 1 tablet (100 mg total) by mouth daily.  30 tablet  0  . metFORMIN (GLUCOPHAGE) 500 MG tablet Take 1,000 mg by mouth 2 (two) times daily with a meal.      . metoprolol (LOPRESSOR) 100 MG tablet Take 100  mg by mouth 2 (two) times daily.      . potassium chloride SA (K-DUR,KLOR-CON) 20 MEQ tablet Take 2 tablets (40 mEq total) by mouth daily.  60 tablet  0  . predniSONE (STERAPRED UNI-PAK) 5 MG TABS Take by mouth.      . ranitidine (ZANTAC) 150 MG tablet Take 300 mg by mouth daily as needed.        No current facility-administered medications for this visit.    Past Medical History  Diagnosis Date  . Essential hypertension, benign   . Type 2 diabetes mellitus   . Mixed hyperlipidemia     Statin intolerance    Past Surgical History  Procedure Laterality Date  . Appendectomy      ZOX:WRUEAV of systems complete and found to be negative unless listed above  PHYSICAL EXAM BP 130/84  Pulse 62  Ht 6\' 1"  (1.854 m)  Wt 250 lb 12 oz (113.739 kg)  BMI 33.09 kg/m2  General: Well developed, well nourished, in no acute distress, obese Head: Eyes PERRLA, No xanthomas.   Normal cephalic and atramatic  Lungs: Clear bilaterally to auscultation and percussion. Heart: HRRR S1 S2, without MRG.  Pulses are 2+ & equal.            No carotid bruit. No JVD.  No abdominal bruits. No femoral bruits. Abdomen: Bowel sounds are positive, abdomen soft and non-tender without masses or                  Hernia's noted. Msk:  Back normal, normal gait. Normal strength and tone for age. Extremities: No clubbing, cyanosis or edema.  DP +1 Neuro: Alert and oriented X 3. Psych:  Good affect, responds appropriately    ASSESSMENT AND PLAN

## 2013-01-16 NOTE — Assessment & Plan Note (Addendum)
No evidence of fluid overload at present. His wt is well maintained concerning volume status. Continue diuretics as directed. Will repeat his BMET for evaluation of kidney status and hypokalemia with potassium replacement.

## 2013-01-16 NOTE — Assessment & Plan Note (Signed)
Blood pressure is well controlled currently. No changes in his medications.

## 2013-01-16 NOTE — Assessment & Plan Note (Signed)
He is significantly deconditioned. Uncertain specific etiology with multiple medical problems. He is advised to increase activity and diminish caloric intake.

## 2013-01-16 NOTE — Patient Instructions (Addendum)
Lab work  ( bmet ) Doctor, hospital O2 at night   Your physician recommends that you schedule a follow-up appointment in: 1 month

## 2013-01-16 NOTE — Assessment & Plan Note (Signed)
I have advised him to wear his O2 every night to assist with oxygenation as he does not wear/tolerate CPAP.

## 2013-01-17 LAB — BASIC METABOLIC PANEL
BUN: 24 mg/dL — ABNORMAL HIGH (ref 6–23)
CO2: 30 mEq/L (ref 19–32)
Calcium: 9.4 mg/dL (ref 8.4–10.5)
Chloride: 102 mEq/L (ref 96–112)
Creat: 1.35 mg/dL (ref 0.50–1.35)

## 2013-01-18 ENCOUNTER — Encounter: Payer: Self-pay | Admitting: Cardiology

## 2013-01-18 DIAGNOSIS — E119 Type 2 diabetes mellitus without complications: Secondary | ICD-10-CM | POA: Insufficient documentation

## 2013-01-19 ENCOUNTER — Encounter: Payer: Self-pay | Admitting: *Deleted

## 2013-01-20 ENCOUNTER — Telehealth: Payer: Self-pay | Admitting: Adult Health

## 2013-01-20 MED ORDER — POTASSIUM CHLORIDE CRYS ER 20 MEQ PO TBCR: 40.0000 meq | EXTENDED_RELEASE_TABLET | Freq: Every day | ORAL | Status: DC

## 2013-01-20 MED ORDER — LOSARTAN POTASSIUM 100 MG PO TABS: 100.0000 mg | ORAL_TABLET | Freq: Every day | ORAL | Status: DC

## 2013-01-20 MED ORDER — FUROSEMIDE 40 MG PO TABS: 20.0000 mg | ORAL_TABLET | Freq: Every day | ORAL | Status: DC

## 2013-01-20 NOTE — Telephone Encounter (Signed)
Pt needs potassium, lasix and losartin called in to [facility] in [place]

## 2013-01-20 NOTE — Telephone Encounter (Signed)
rx sent to pharmacy by e-script  

## 2013-02-12 NOTE — Progress Notes (Signed)
HPI: Benjamin [name] is a 62 year old patient of Dr. Diona Browner, or following for ongoing assessment and treatment of hypertension, chronic diastolic CHF,  abnormal relaxation and increased filling pressure (grade 2 diastolic dysfunction)  and mixed hyperlipidemia. Other history includes diabetes which has been difficult to control. The patient was last seen by Dr. Charlton Haws on 12/26/2012 with no changes to his medications. The patient was started on colchicine and tramadol by his PCP. He was continued on other medications.   Labs were completed on 01/16/2013 with a sodium 139, potassium 5.0, chloride 102, CO2 24, with a creatinine of 1.35.  Benjamin [name] comes today feeling very tired and worn out. He has little energy. Denies chest pain or DOE.   Allergies  Allergen Reactions  . Niacin-Lovastatin Er Other (See Comments)    Body aches  . Zocor (Simvastatin - High Dose) Other (See Comments)    Body aches.    Current Outpatient Prescriptions  Medication Sig Dispense Refill  . allopurinol (ZYLOPRIM) 100 MG tablet Take 100 mg by mouth daily.      [name] amLODipine (NORVASC) 10 MG tablet Take 1 tablet (10 mg total) by mouth daily.  30 tablet  0  . COD LIVER OIL PO Take by mouth.      . diazepam (VALIUM) 10 MG tablet Take 10 mg by mouth every 6 (six) hours as needed for anxiety.      [name] glimepiride (AMARYL) 4 MG tablet Take 4 mg by mouth 2 (two) times daily.      . hydrochlorothiazide (HYDRODIURIL) 25 MG tablet Take 25 mg by mouth daily.      [name] HYDROcodone-acetaminophen (NORCO/VICODIN) 5-325 MG per tablet Take 1 tablet by mouth every 6 (six) hours as needed for pain.      [name] HYDROmorphone (DILAUDID) 4 MG tablet Take 4 mg by mouth every 4 (four) hours as needed for pain.      [name] losartan (COZAAR) 100 MG tablet Take 1 tablet (100 mg total) by mouth daily.  30 tablet  6  . metFORMIN (GLUCOPHAGE) 500 MG tablet Take 1,000 mg by mouth 2 (two) times daily with a meal.      . metoprolol (LOPRESSOR) 100 MG tablet Take 100  mg by mouth 2 (two) times daily.      . potassium chloride SA (K-DUR,KLOR-CON) 20 MEQ tablet Take 2 tablets (40 mEq total) by mouth daily.  60 tablet  6  . predniSONE (STERAPRED UNI-PAK) 5 MG TABS Take by mouth.      . ranitidine (ZANTAC) 150 MG tablet Take 300 mg by mouth daily as needed.       . hydrochlorothiazide (HYDRODIURIL) 25 MG tablet Take 1 tablet (25 mg total) by mouth daily.  90 tablet  1   No current facility-administered medications for this visit.    Past Medical History  Diagnosis Date  . Essential hypertension, benign   . Type 2 diabetes mellitus   . Mixed hyperlipidemia     Statin intolerance    Past Surgical History  Procedure Laterality Date  . Appendectomy      WUJ:WJXBJY of systems complete and found to be negative unless listed above  PHYSICAL EXAM BP 140/90  Pulse 58  Ht 6\' 1"  (1.854 m)  Wt 247 lb (112.038 kg)  BMI 32.59 kg/m2  SpO2 95%  General: Well developed, well nourished, in no acute distress Head: Eyes PERRLA, No xanthomas.   Normal cephalic and atramatic  Lungs: Clear bilaterally to auscultation and percussion. Heart:  HRRR S1 S2, without MRG.  Pulses are 2+ & equal.            No carotid bruit. No JVD.  No abdominal bruits. No femoral bruits. Abdomen: Bowel sounds are positive, abdomen soft and non-tender without masses or                  Hernia's noted. Msk:  Back normal, normal gait. Normal strength and tone for age. Extremities: No clubbing, cyanosis or edema.  DP +1 Neuro: Alert and oriented X 3. Psych:  Good affect, responds appropriately    ASSESSMENT AND PLAN

## 2013-02-13 ENCOUNTER — Ambulatory Visit (INDEPENDENT_AMBULATORY_CARE_PROVIDER_SITE_OTHER): Payer: 59 | Admitting: Adult Health

## 2013-02-13 ENCOUNTER — Encounter: Payer: Self-pay | Admitting: Adult Health

## 2013-02-13 VITALS — BP 140/90 | HR 58 | Ht 73.0 in | Wt 247.0 lb

## 2013-02-13 DIAGNOSIS — E785 Hyperlipidemia, unspecified: Secondary | ICD-10-CM

## 2013-02-13 DIAGNOSIS — G4730 Sleep apnea, unspecified: Secondary | ICD-10-CM

## 2013-02-13 DIAGNOSIS — I10 Essential (primary) hypertension: Secondary | ICD-10-CM

## 2013-02-13 MED ORDER — HYDROCHLOROTHIAZIDE 25 MG PO TABS: 25.0000 mg | ORAL_TABLET | Freq: Every day | ORAL | Status: DC

## 2013-02-13 NOTE — Assessment & Plan Note (Signed)
He is unable to wear CPAP but is on O2 at hs. I have encouraged to have him try this again as this will help with his overall fatigue.

## 2013-02-13 NOTE — Progress Notes (Deleted)
Name: Benjamin [name]    DOB: [birth year]  Age: 62 y.o.  MR#: [id number]       PCP:  Kirk Ruths, MD      Insurance: Payor: Cleatrice Burke / Plan: Advertising copywriter / Product Type: *No Product type* /   CC:   No chief complaint on file.   VS Filed Vitals:   02/13/13 1451  BP: 140/90  Pulse: 58  Height: 6\' 1"  (1.854 m)  Weight: 247 lb (112.038 kg)  SpO2: 95%    Weights Current Weight  02/13/13 247 lb (112.038 kg)  01/16/13 250 lb 12 oz (113.739 kg)  12/26/12 253 lb (114.76 kg)    Blood Pressure  BP Readings from Last 3 Encounters:  02/13/13 140/90  01/16/13 130/84  12/26/12 110/56     Admit date:  (Not on file) Last encounter with RMR:  01/20/2013   Allergy Niacin-lovastatin er and Zocor  Current Outpatient Prescriptions  Medication Sig Dispense Refill  . allopurinol (ZYLOPRIM) 100 MG tablet Take 100 mg by mouth daily.      [name] amLODipine (NORVASC) 10 MG tablet Take 1 tablet (10 mg total) by mouth daily.  30 tablet  0  . COD LIVER OIL PO Take by mouth.      . diazepam (VALIUM) 10 MG tablet Take 10 mg by mouth every 6 (six) hours as needed for anxiety.      . furosemide (LASIX) 40 MG tablet Take 0.5 tablets (20 mg total) by mouth daily.  30 tablet  6  . glimepiride (AMARYL) 4 MG tablet Take 4 mg by mouth 2 (two) times daily.      [name] HYDROcodone-acetaminophen (NORCO/VICODIN) 5-325 MG per tablet Take 1 tablet by mouth every 6 (six) hours as needed for pain.      [name] HYDROmorphone (DILAUDID) 4 MG tablet Take 4 mg by mouth every 4 (four) hours as needed for pain.      [name] losartan (COZAAR) 100 MG tablet Take 1 tablet (100 mg total) by mouth daily.  30 tablet  6  . metFORMIN (GLUCOPHAGE) 500 MG tablet Take 1,000 mg by mouth 2 (two) times daily with a meal.      . metoprolol (LOPRESSOR) 100 MG tablet Take 100 mg by mouth 2 (two) times daily.      . potassium chloride SA (K-DUR,KLOR-CON) 20 MEQ tablet Take 2 tablets (40 mEq total) by mouth daily.  60 tablet  6  . predniSONE  (STERAPRED UNI-PAK) 5 MG TABS Take by mouth.      . ranitidine (ZANTAC) 150 MG tablet Take 300 mg by mouth daily as needed.        No current facility-administered medications for this visit.    Discontinued Meds:   There are no discontinued medications.  Patient Active Problem List   Diagnosis Date Noted  . Diabetes mellitus type II, controlled 01/18/2013  . Gout 12/26/2012  . Obesity 12/20/2012  . Hypertension 10/22/2011  . Heart murmur 10/22/2011  . Sleep apnea 10/22/2011  . Hyperlipidemia 10/22/2011  . Type II or unspecified type diabetes mellitus without mention of complication, uncontrolled 10/22/2011    LABS    Component Value Date/Time   NA 139 01/16/2013 1502   NA 138 12/23/2012 0527   NA 139 12/22/2012 0508   K 5.0 01/16/2013 1502   K 3.0* 12/23/2012 0527   K 3.2* 12/22/2012 0508   CL 102 01/16/2013 1502   CL 93* 12/23/2012 0527   CL 91* 12/22/2012 4098  CO2 30 01/16/2013 1502   CO2 35* 12/23/2012 0527   CO2 41* 12/22/2012 0508   GLUCOSE 113* 01/16/2013 1502   GLUCOSE 131* 12/23/2012 0527   GLUCOSE 96 12/22/2012 0508   BUN 24* 01/16/2013 1502   BUN 25* 12/23/2012 0527   BUN 19 12/22/2012 0508   CREATININE 1.35 01/16/2013 1502   CREATININE 1.37* 12/23/2012 0527   CREATININE 1.17 12/22/2012 0508   CREATININE 1.12 12/21/2012 0503   CREATININE 1.10 10/22/2011 1050   CALCIUM 9.4 01/16/2013 1502   CALCIUM 9.4 12/23/2012 0527   CALCIUM 8.9 12/22/2012 0508   GFRNONAA 53* 12/23/2012 0527   GFRNONAA 65* 12/22/2012 0508   GFRNONAA 68* 12/21/2012 0503   GFRAA 62* 12/23/2012 0527   GFRAA 75* 12/22/2012 0508   GFRAA 79* 12/21/2012 0503   CMP     Component Value Date/Time   NA 139 01/16/2013 1502   K 5.0 01/16/2013 1502   CL 102 01/16/2013 1502   CO2 30 01/16/2013 1502   GLUCOSE 113* 01/16/2013 1502   BUN 24* 01/16/2013 1502   CREATININE 1.35 01/16/2013 1502   CREATININE 1.37* 12/23/2012 0527   CALCIUM 9.4 01/16/2013 1502   PROT 8.1 12/20/2012 1045   ALBUMIN 3.7 12/20/2012 1045   AST 35 12/20/2012 1045   ALT 52 12/20/2012 1045   ALKPHOS  106 12/20/2012 1045   BILITOT 0.5 12/20/2012 1045   GFRNONAA 53* 12/23/2012 0527   GFRAA 62* 12/23/2012 0527       Component Value Date/Time   WBC 13.0* 12/21/2012 0503   WBC 13.4* 12/20/2012 1045   HGB 12.9* 12/21/2012 0503   HGB 13.7 12/20/2012 1045   HCT 40.3 12/21/2012 0503   HCT 42.1 12/20/2012 1045   MCV 92.9 12/21/2012 0503   MCV 92.7 12/20/2012 1045    Lipid Panel  No results found for this basename: chol, trig, hdl, cholhdl, vldl, ldlcalc    ABG No results found for this basename: phart, pco2, pco2art, po2, po2art, hco3, tco2, acidbasedef, o2sat     Lab Results  Component Value Date   TSH 0.799 12/20/2012   BNP (last 3 results)  Recent Labs  12/20/12 1045 12/22/12 0508 12/23/12 0527  PROBNP 929.0* 448.2* 207.4*   Cardiac Panel (last 3 results) No results found for this basename: CKTOTAL, CKMB, TROPONINI, RELINDX,  in the last 72 hours  Iron/TIBC/Ferritin No results found for this basename: iron, tibc, ferritin     EKG Orders placed during the hospital encounter of 12/20/12  . ED EKG  . ED EKG  . ED EKG  . ED EKG  . EKG 12-LEAD  . EKG 12-LEAD  . EKG  . EKG  . EKG     Prior Assessment and Plan Problem List as of 02/13/2013   Hypertension   Last Assessment & Plan   01/16/2013 Office Visit Written 01/16/2013  3:28 PM by Jodelle Gross, NP     Blood pressure is well controlled currently. No changes in his medications.    Heart murmur   Last Assessment & Plan   11/16/2011 Office Visit Written 11/16/2011  3:52 PM by Jonelle Sidle, MD     Calcified aortic valve without significant degree of stenosis.    Sleep apnea   Last Assessment & Plan   01/16/2013 Office Visit Written 01/16/2013  3:30 PM by Jodelle Gross, NP     I have advised him to wear his O2 every night to assist with oxygenation as he does not wear/tolerate CPAP.  Hyperlipidemia   Last Assessment & Plan   12/26/2012 Office Visit Written 12/26/2012 10:03 AM by Wendall Stade, MD      Cholesterol is at  goal.  Continue current dose of statin and diet Rx.  No myalgias or side effects.  F/U  LFT's in 6 months. No results found for this basename: LDLCALC                Type II or unspecified type diabetes mellitus without mention of complication, uncontrolled   Last Assessment & Plan   12/26/2012 Office Visit Written 12/26/2012 10:06 AM by Wendall Stade, MD     Poor control  Personally spoke with Dr Regino Schultze regarding being seen today to adjust meds.     Obesity   Last Assessment & Plan   01/16/2013 Office Visit Written 01/16/2013  3:32 PM by Jodelle Gross, NP     He is significantly deconditioned. Uncertain specific etiology with multiple medical problems. He is advised to increase activity and diminish caloric intake.    Gout   Last Assessment & Plan   12/26/2012 Office Visit Written 12/26/2012 10:07 AM by Wendall Stade, MD     Discussed with Dr Regino Schultze  Would start colchicine and tramadol  Try to avoid prednisone as his BS is already poorly controlled. Dr Regino Schultze to see today    Diabetes mellitus type II, controlled       Imaging: No results found.

## 2013-02-13 NOTE — Patient Instructions (Addendum)
Your physician recommends that you schedule a follow-up appointment in: ONE MONTH  Your physician has recommended you make the following change in your medication:   1) STOP LASIX 2) DECREASE POTASSIUM TO DAILY (ONE TAB DAILY) 3) START HCTZ 25MG  DAILY (HYDROCHLORTHIZIDE)   Your physician recommends that you return for lab work in: TODAY (BMET) SLIPS GIVEN

## 2013-02-13 NOTE — Assessment & Plan Note (Signed)
Continue statin as directed. 

## 2013-02-13 NOTE — Assessment & Plan Note (Signed)
He complains of feeling washed out and tired. This may be related to his lasix dose. Will discontinue it and begin HCTZ 25 mg daily. He will decrease his potassium to 20 mEq daily. I have advised him that use of CPAP as directed will be helpful in BP control. No obvious fluid retention. Repeat BMET

## 2013-02-14 LAB — BASIC METABOLIC PANEL
BUN: 20 mg/dL (ref 6–23)
Creat: 1.33 mg/dL (ref 0.50–1.35)
Potassium: 4.5 mEq/L (ref 3.5–5.3)

## 2013-02-16 ENCOUNTER — Encounter: Payer: Self-pay | Admitting: *Deleted

## 2013-03-17 ENCOUNTER — Encounter: Payer: Self-pay | Admitting: Adult Health

## 2013-03-17 ENCOUNTER — Ambulatory Visit (INDEPENDENT_AMBULATORY_CARE_PROVIDER_SITE_OTHER): Payer: BC Managed Care – PPO | Admitting: Adult Health

## 2013-03-17 VITALS — BP 150/89 | HR 56 | Ht 74.0 in | Wt 250.0 lb

## 2013-03-17 DIAGNOSIS — G4730 Sleep apnea, unspecified: Secondary | ICD-10-CM

## 2013-03-17 DIAGNOSIS — E785 Hyperlipidemia, unspecified: Secondary | ICD-10-CM

## 2013-03-17 DIAGNOSIS — E669 Obesity, unspecified: Secondary | ICD-10-CM

## 2013-03-17 MED ORDER — DOCUSATE SODIUM 100 MG PO CAPS: 100.0000 mg | ORAL_CAPSULE | Freq: Every day | ORAL | Status: DC

## 2013-03-17 NOTE — Assessment & Plan Note (Signed)
I think some of his symptoms are lack of exercise and wt. He is not motivated to increase his activity. There also appears to be a component of low level depression. PCP should address this at his discretion.

## 2013-03-17 NOTE — Assessment & Plan Note (Signed)
Followed by PCP.  Continue risk management with low cholesterol diet.

## 2013-03-17 NOTE — Patient Instructions (Addendum)
Your physician recommends that you schedule a follow-up appointment in: 6 months. You will receive a reminder letter in the mail in about 4 months reminding you to call and schedule your appointment. If you don't receive this letter, please contact our office.    Your physician has recommended you make the following change in your medication:  1. Colace 100mg  daily.

## 2013-03-17 NOTE — Assessment & Plan Note (Signed)
He is on O2 via [place] only and cannot tolerate CPAP.Daytime somoluence is probably as a result.

## 2013-03-17 NOTE — Assessment & Plan Note (Signed)
Slightly hypertensive today with white coat syndrome. He will continue on current medications without changes Will see him in 6 months unless symptomatic. He is tolerating HCTZ without muscle cramps or dizziness

## 2013-03-17 NOTE — Progress Notes (Signed)
HPI: [name] is a 62 year old patient of Dr. Diona Browner we are following for ongoing assessment and management of hypertension, chronic diastolic CHF, grade 2 diastolic dysfunction, mixed hyperlipidemia, with history of OSA. The patient was last seen in the office on 02/13/2013 with complaints of being tired and worn out but remain medically compliant.Lasix was discontinued and HCTZ 25 mg was started. Potassium was decreased to 20 mg daily. He was advised to use his CPAP as directed. He has no evidence of fluid overload on that visit. A BMET was repeated.   BMET demonstrated sodium 139 potassium 4.5 chloride 102 CO2 30 BUN 20 creatinine 1.3. He comes today with continued complaints of fatigue. He denies chest pain, but is easily short of breath.  Allergies  Allergen Reactions  . Niacin-Lovastatin Er Other (See Comments)    Body aches  . Zocor (Simvastatin - High Dose) Other (See Comments)    Body aches.    Current Outpatient Prescriptions  Medication Sig Dispense Refill  . allopurinol (ZYLOPRIM) 100 MG tablet Take 100 mg by mouth daily.      [name] amLODipine (NORVASC) 10 MG tablet Take 1 tablet (10 mg total) by mouth daily.  30 tablet  0  . COD LIVER OIL PO Take by mouth.      . diazepam (VALIUM) 10 MG tablet Take 10 mg by mouth every 6 (six) hours as needed for anxiety.      [name] glimepiride (AMARYL) 4 MG tablet Take 4 mg by mouth 2 (two) times daily.      . hydrochlorothiazide (HYDRODIURIL) 25 MG tablet Take 1 tablet (25 mg total) by mouth daily.  90 tablet  1  . HYDROcodone-acetaminophen (NORCO/VICODIN) 5-325 MG per tablet Take 1 tablet by mouth every 6 (six) hours as needed for pain.      [name] HYDROmorphone (DILAUDID) 4 MG tablet Take 4 mg by mouth every 4 (four) hours as needed for pain.      [name] losartan (COZAAR) 100 MG tablet Take 1 tablet (100 mg total) by mouth daily.  30 tablet  6  . metFORMIN (GLUCOPHAGE) 500 MG tablet Take 1,000 mg by mouth 2 (two) times daily with a meal.      .  metoprolol (LOPRESSOR) 100 MG tablet Take 100 mg by mouth 2 (two) times daily.      . potassium chloride SA (K-DUR,KLOR-CON) 20 MEQ tablet Take 2 tablets (40 mEq total) by mouth daily.  60 tablet  6  . ranitidine (ZANTAC) 150 MG tablet Take 300 mg by mouth daily as needed.        No current facility-administered medications for this visit.    Past Medical History  Diagnosis Date  . Essential hypertension, benign   . Type 2 diabetes mellitus   . Mixed hyperlipidemia     Statin intolerance    Past Surgical History  Procedure Laterality Date  . Appendectomy      ROS:.NMROs  PHYSICAL EXAM BP 150/89  Pulse 56  Ht 6\' 2"  (1.88 m)  Wt 250 lb (113.399 kg)  BMI 32.08 kg/m2  General: Well developed, well nourished, in no acute distress Head: Eyes PERRLA, No xanthomas.   Normal cephalic and atramatic  Lungs: Clear bilaterally to auscultation and percussion. Heart: HRRR S1 S2, without MRG.  Pulses are 2+ & equal.            No carotid bruit. No JVD.  No abdominal bruits. No femoral bruits. Abdomen: Bowel sounds are positive, abdomen soft and  non-tender without masses or                  Hernia's noted. Msk:  Back normal, normal gait. Normal strength and tone for age. Extremities: No clubbing, cyanosis or edema.  DP +1 Neuro: Alert and oriented X 3. Psych:  Flat affect, responds appropriately  EKG:  ASSESSMENT AND PLAN

## 2013-06-30 ENCOUNTER — Encounter (INDEPENDENT_AMBULATORY_CARE_PROVIDER_SITE_OTHER): Payer: Self-pay | Admitting: *Deleted

## 2013-08-25 ENCOUNTER — Encounter: Payer: Self-pay | Admitting: Gastroenterology

## 2013-09-09 ENCOUNTER — Other Ambulatory Visit: Payer: Self-pay | Admitting: Adult Health

## 2013-09-14 ENCOUNTER — Ambulatory Visit: Payer: BC Managed Care – PPO | Admitting: Adult Health

## 2013-09-15 ENCOUNTER — Ambulatory Visit: Payer: BC Managed Care – PPO | Admitting: Adult Health

## 2013-09-22 ENCOUNTER — Encounter: Payer: Self-pay | Admitting: Adult Health

## 2013-09-22 ENCOUNTER — Ambulatory Visit (INDEPENDENT_AMBULATORY_CARE_PROVIDER_SITE_OTHER): Payer: BC Managed Care – PPO | Admitting: Adult Health

## 2013-09-22 VITALS — BP 139/74 | HR 59 | Ht 73.0 in | Wt 248.0 lb

## 2013-09-22 DIAGNOSIS — R011 Cardiac murmur, unspecified: Secondary | ICD-10-CM

## 2013-09-22 DIAGNOSIS — G4730 Sleep apnea, unspecified: Secondary | ICD-10-CM

## 2013-09-22 DIAGNOSIS — I10 Essential (primary) hypertension: Secondary | ICD-10-CM

## 2013-09-22 MED ORDER — AMLODIPINE BESYLATE 10 MG PO TABS: 10.0000 mg | ORAL_TABLET | Freq: Every day | ORAL | Status: DC

## 2013-09-22 MED ORDER — HYDROCHLOROTHIAZIDE 25 MG PO TABS: 25.0000 mg | ORAL_TABLET | Freq: Every day | ORAL | Status: DC

## 2013-09-22 MED ORDER — METOPROLOL TARTRATE 100 MG PO TABS: 100.0000 mg | ORAL_TABLET | Freq: Two times a day (BID) | ORAL | Status: DC

## 2013-09-22 NOTE — Assessment & Plan Note (Signed)
Does not tolerate CPAP, this is causing significant fatigue during the day and some insomnia.

## 2013-09-22 NOTE — Patient Instructions (Signed)
Your physician wants you to follow-up in: 6 MONTHS You will receive a reminder letter in the mail two months in advance. If you don't receive a letter, please call our office to schedule the follow-up appointment. 

## 2013-09-22 NOTE — Assessment & Plan Note (Signed)
The patient has a calcified aortic valve with no significant degree of stenosis per echocardiogram one year ago. We will continue to monitor his status. No repeat echo will be completed at this time.

## 2013-09-22 NOTE — Progress Notes (Deleted)
Name: Benjamin [name]    DOB: [birth year]  Age: 63 y.o.  MR#: [id number]       PCP:  Leonides Grills, MD      Insurance: Payor: [place] / Plan: [employer] [place] PPO / Product Type: *No Product type* /   CC:    Chief Complaint  Patient presents with  . Hypertension  . Congestive Heart Failure    Diastolic    VS Filed Vitals:   09/22/13 1434  BP: 139/74  Pulse: 59  Height: 6\' 1"  (1.854 m)  Weight: 248 lb (112.492 kg)    Weights Current Weight  09/22/13 248 lb (112.492 kg)  03/17/13 250 lb (113.399 kg)  02/13/13 247 lb (112.038 kg)    Blood Pressure  BP Readings from Last 3 Encounters:  09/22/13 139/74  03/17/13 150/89  02/13/13 140/90     Admit date:  (Not on file) Last encounter with RMR:  09/09/2013   Allergy Niacin-lovastatin er and Zocor  Current Outpatient Prescriptions  Medication Sig Dispense Refill  . predniSONE (STERAPRED UNI-PAK) 5 MG TABS tablet Take 5 mg by mouth.      [name] allopurinol (ZYLOPRIM) 100 MG tablet Take 100 mg by mouth daily.      [name] amLODipine (NORVASC) 10 MG tablet Take 1 tablet (10 mg total) by mouth daily.  30 tablet  0  . COD LIVER OIL PO Take by mouth.      . diazepam (VALIUM) 10 MG tablet Take 10 mg by mouth every 6 (six) hours as needed for anxiety.      . docusate sodium (COLACE) 100 MG capsule Take 1 capsule (100 mg total) by mouth at bedtime.  30 capsule  3  . glimepiride (AMARYL) 4 MG tablet Take 4 mg by mouth 2 (two) times daily.      . hydrochlorothiazide (HYDRODIURIL) 25 MG tablet Take 1 tablet (25 mg total) by mouth daily.  90 tablet  1  . HYDROcodone-acetaminophen (NORCO/VICODIN) 5-325 MG per tablet Take 1 tablet by mouth every 6 (six) hours as needed for pain.      [name] losartan (COZAAR) 100 MG tablet TAKE ONE TABLET BY MOUTH ONCE DAILY  30 tablet  6  . metFORMIN (GLUCOPHAGE) 500 MG tablet Take 1,000 mg by mouth 2 (two) times daily with a meal.      . metoprolol (LOPRESSOR) 100 MG tablet Take 100 mg by mouth 2 (two) times  daily.      . potassium chloride SA (K-DUR,KLOR-CON) 20 MEQ tablet Take 2 tablets (40 mEq total) by mouth daily.  60 tablet  6  . ranitidine (ZANTAC) 150 MG tablet Take 300 mg by mouth daily as needed.        No current facility-administered medications for this visit.    Discontinued Meds:    Medications Discontinued During This Encounter  Medication Reason  . HYDROmorphone (DILAUDID) 4 MG tablet Error    Patient Active Problem List   Diagnosis Date Noted  . Diabetes mellitus type II, controlled 01/18/2013  . Gout 12/26/2012  . Obesity 12/20/2012  . Hypertension 10/22/2011  . Heart murmur 10/22/2011  . Sleep apnea 10/22/2011  . Hyperlipidemia 10/22/2011  . Type II or unspecified type diabetes mellitus without mention of complication, uncontrolled 10/22/2011    LABS    Component Value Date/Time   NA 139 02/13/2013 1535   NA 139 01/16/2013 1502   NA 138 12/23/2012 0527   K 4.5 02/13/2013 1535   K 5.0 01/16/2013  1502   K 3.0* 12/23/2012 0527   CL 102 02/13/2013 1535   CL 102 01/16/2013 1502   CL 93* 12/23/2012 0527   CO2 30 02/13/2013 1535   CO2 30 01/16/2013 1502   CO2 35* 12/23/2012 0527   GLUCOSE 56* 02/13/2013 1535   GLUCOSE 113* 01/16/2013 1502   GLUCOSE 131* 12/23/2012 0527   BUN 20 02/13/2013 1535   BUN 24* 01/16/2013 1502   BUN 25* 12/23/2012 0527   CREATININE 1.33 02/13/2013 1535   CREATININE 1.35 01/16/2013 1502   CREATININE 1.37* 12/23/2012 0527   CREATININE 1.17 12/22/2012 0508   CREATININE 1.12 12/21/2012 0503   CREATININE 1.10 10/22/2011 1050   CALCIUM 9.3 02/13/2013 1535   CALCIUM 9.4 01/16/2013 1502   CALCIUM 9.4 12/23/2012 0527   GFRNONAA 53* 12/23/2012 0527   GFRNONAA 65* 12/22/2012 0508   GFRNONAA 68* 12/21/2012 0503   GFRAA 62* 12/23/2012 0527   GFRAA 75* 12/22/2012 0508   GFRAA 79* 12/21/2012 0503   CMP     Component Value Date/Time   NA 139 02/13/2013 1535   K 4.5 02/13/2013 1535   CL 102 02/13/2013 1535   CO2 30 02/13/2013 1535   GLUCOSE 56* 02/13/2013 1535   BUN 20 02/13/2013 1535    CREATININE 1.33 02/13/2013 1535   CREATININE 1.37* 12/23/2012 0527   CALCIUM 9.3 02/13/2013 1535   PROT 8.1 12/20/2012 1045   ALBUMIN 3.7 12/20/2012 1045   AST 35 12/20/2012 1045   ALT 52 12/20/2012 1045   ALKPHOS 106 12/20/2012 1045   BILITOT 0.5 12/20/2012 1045   GFRNONAA 53* 12/23/2012 0527   GFRAA 62* 12/23/2012 0527       Component Value Date/Time   WBC 13.0* 12/21/2012 0503   WBC 13.4* 12/20/2012 1045   HGB 12.9* 12/21/2012 0503   HGB 13.7 12/20/2012 1045   HCT 40.3 12/21/2012 0503   HCT 42.1 12/20/2012 1045   MCV 92.9 12/21/2012 0503   MCV 92.7 12/20/2012 1045    Lipid Panel  No results found for this basename: chol, trig, hdl, cholhdl, vldl, ldlcalc    ABG No results found for this basename: phart, pco2, pco2art, po2, po2art, hco3, tco2, acidbasedef, o2sat     Lab Results  Component Value Date   TSH 0.799 12/20/2012   BNP (last 3 results)  Recent Labs  12/20/12 1045 12/22/12 0508 12/23/12 0527  PROBNP 929.0* 448.2* 207.4*   Cardiac Panel (last 3 results) No results found for this basename: CKTOTAL, CKMB, TROPONINI, RELINDX,  in the last 72 hours  Iron/TIBC/Ferritin No results found for this basename: iron, tibc, ferritin     EKG Orders placed during the hospital encounter of 12/20/12  . ED EKG  . ED EKG  . ED EKG  . ED EKG  . EKG 12-LEAD  . EKG 12-LEAD  . EKG  . EKG  . EKG     Prior Assessment and Plan Problem List as of 09/22/2013   Hypertension   Last Assessment & Plan   03/17/2013 Office Visit Written 03/17/2013  1:23 PM by Lendon Colonel, NP     Slightly hypertensive today with white coat syndrome. He will continue on current medications without changes Will see him in 6 months unless symptomatic. He is tolerating HCTZ without muscle cramps or dizziness    Heart murmur   Last Assessment & Plan   11/16/2011 Office Visit Written 11/16/2011  3:52 PM by Satira Sark, MD     Calcified aortic valve without significant  degree of stenosis.    Sleep apnea   Last Assessment &  Plan   03/17/2013 Office Visit Written 03/17/2013  1:26 PM by Lendon Colonel, NP     He is on O2 via [place] only and cannot tolerate CPAP.Daytime somoluence is probably as a result.    Hyperlipidemia   Last Assessment & Plan   03/17/2013 Office Visit Written 03/17/2013  1:24 PM by Lendon Colonel, NP     Followed by PCP.  Continue risk management with low cholesterol diet.    Type II or unspecified type diabetes mellitus without mention of complication, uncontrolled   Last Assessment & Plan   12/26/2012 Office Visit Written 12/26/2012 10:06 AM by Josue Hector, MD     Poor control  Personally spoke with Dr Orson Ape regarding being seen today to adjust meds.     Obesity   Last Assessment & Plan   03/17/2013 Office Visit Written 03/17/2013  1:25 PM by Lendon Colonel, NP     I think some of his symptoms are lack of exercise and wt. He is not motivated to increase his activity. There also appears to be a component of low level depression. PCP should address this at his discretion.    Gout   Last Assessment & Plan   12/26/2012 Office Visit Written 12/26/2012 10:07 AM by Josue Hector, MD     Discussed with Dr Orson Ape  Would start colchicine and tramadol  Try to avoid prednisone as his BS is already poorly controlled. Dr Orson Ape to see today    Diabetes mellitus type II, controlled       Imaging: No results found.

## 2013-09-22 NOTE — Progress Notes (Signed)
HPI: Benjamin [name] is a 63 year old patient of Dr. Domenic Polite we are following for ongoing assessment and management of hypertension, chronic diastolic CHF, grade 2 diastolic dysfunction, mixed hyperlipidemia, and history of obstructive sleep apnea. He was last seen in the office in July of 2014.    On last visit he was easily short of breath but denied any chest pain. He continues to complain of generalized fatigue and feeling worn out. He unfortunately cannot tolerate CPAP, but wears O2 via nasal cannula at nighttime. He is very sedentary.   He comes today without further cardiac complaints. He continues generalized fatigue, mild dyspnea on exertion, but denies any chest pain. He is medically compliant. He requires refills on his antihypertensives. He is being treated for rotator cuff tear with prednisone. He states it is making him a little jittery. The patient otherwise is unchanged from last visit.  Allergies  Allergen Reactions  . Niacin-Lovastatin Er Other (See Comments)    Body aches  . Zocor [Simvastatin - High Dose] Other (See Comments)    Body aches.    Current Outpatient Prescriptions  Medication Sig Dispense Refill  . predniSONE (STERAPRED UNI-PAK) 5 MG TABS tablet Take 5 mg by mouth.      [name] allopurinol (ZYLOPRIM) 100 MG tablet Take 100 mg by mouth daily.      [name] amLODipine (NORVASC) 10 MG tablet Take 1 tablet (10 mg total) by mouth daily.  30 tablet  0  . COD LIVER OIL PO Take by mouth.      . diazepam (VALIUM) 10 MG tablet Take 10 mg by mouth every 6 (six) hours as needed for anxiety.      . docusate sodium (COLACE) 100 MG capsule Take 1 capsule (100 mg total) by mouth at bedtime.  30 capsule  3  . glimepiride (AMARYL) 4 MG tablet Take 4 mg by mouth 2 (two) times daily.      . hydrochlorothiazide (HYDRODIURIL) 25 MG tablet Take 1 tablet (25 mg total) by mouth daily.  90 tablet  1  . HYDROcodone-acetaminophen (NORCO/VICODIN) 5-325 MG per tablet Take 1 tablet by mouth every 6 (six)  hours as needed for pain.      [name] losartan (COZAAR) 100 MG tablet TAKE ONE TABLET BY MOUTH ONCE DAILY  30 tablet  6  . metFORMIN (GLUCOPHAGE) 500 MG tablet Take 1,000 mg by mouth 2 (two) times daily with a meal.      . metoprolol (LOPRESSOR) 100 MG tablet Take 100 mg by mouth 2 (two) times daily.      . potassium chloride SA (K-DUR,KLOR-CON) 20 MEQ tablet Take 2 tablets (40 mEq total) by mouth daily.  60 tablet  6  . ranitidine (ZANTAC) 150 MG tablet Take 300 mg by mouth daily as needed.        No current facility-administered medications for this visit.    Past Medical History  Diagnosis Date  . Essential hypertension, benign   . Type 2 diabetes mellitus   . Mixed hyperlipidemia     Statin intolerance    Past Surgical History  Procedure Laterality Date  . Appendectomy      IDP:OEUMPN of systems complete and found to be negative unless listed above  PHYSICAL EXAM BP 139/74  Pulse 59  Ht 6\' 1"  (1.854 m)  Wt 248 lb (112.492 kg)  BMI 32.73 kg/m2  General: Well developed, well nourished, in no acute distress Head: Eyes PERRLA, No xanthomas.   Normal cephalic and atramatic  Lungs: Clear bilaterally to auscultation with mild crackles noted in the bases  Heart: HRRR S1 S2, without MRG.  Pulses are 2+ & equal.            No carotid bruit. No JVD.  No abdominal bruits. No femoral bruits. Abdomen: Bowel sounds are positive, abdomen soft and non-tender without masses or                  Hernia's noted. Msk:  Back normal, normal  but slow gait. Normal strength and tone for age. Extremities: No clubbing, cyanosis or edema.  DP +1 Neuro: Alert and oriented X 3. Psych:  Good affect, responds appropriately    ASSESSMENT AND PLAN

## 2013-09-22 NOTE — Assessment & Plan Note (Addendum)
Pressure is currently very well-controlled. We will refill his amlodipine hydrochlorothiazide and metoprolol. He is due to followup with his primary care physician within 2 weeks. Labs are monitored through them. Diastolic dysfunction remains. He is advised to continue his medications as directed to avoid further deterioration and diastolic function.

## 2013-09-23 ENCOUNTER — Encounter: Payer: Self-pay | Admitting: Adult Health

## 2013-09-28 ENCOUNTER — Ambulatory Visit: Payer: BC Managed Care – PPO | Admitting: Gastroenterology

## 2013-10-22 ENCOUNTER — Encounter (INDEPENDENT_AMBULATORY_CARE_PROVIDER_SITE_OTHER): Payer: Self-pay

## 2013-10-22 ENCOUNTER — Encounter: Payer: Self-pay | Admitting: Gastroenterology

## 2013-10-22 ENCOUNTER — Ambulatory Visit (INDEPENDENT_AMBULATORY_CARE_PROVIDER_SITE_OTHER): Payer: BC Managed Care – PPO | Admitting: Gastroenterology

## 2013-10-22 VITALS — BP 149/81 | HR 62 | Temp 98.3°F | Ht 74.0 in | Wt 259.6 lb

## 2013-10-22 DIAGNOSIS — K5900 Constipation, unspecified: Secondary | ICD-10-CM | POA: Insufficient documentation

## 2013-10-22 DIAGNOSIS — Z1211 Encounter for screening for malignant neoplasm of colon: Secondary | ICD-10-CM

## 2013-10-22 MED ORDER — PEG 3350-KCL-NA BICARB-NACL 420 G PO SOLR: 4000.0000 mL | ORAL | Status: DC

## 2013-10-22 NOTE — Assessment & Plan Note (Signed)
Mild constipation likely related to medication. Well managed with stool softener. Advised he could add miralax one capful daily if needed. Needs screening colonoscopy. Due to polypharmacy we will augment conscious sedation with phenergan 25mg  IV 30 minutes before.  I have discussed the risks, alternatives, benefits with regards to but not limited to the risk of reaction to medication, bleeding, infection, perforation and the patient is agreeable to proceed. Written consent to be obtained.  Patient really does fairly well with UGI issues. Rare heartburn as long as doesn't stray from healthy diet. Vague difficulty with swallowing on rare occasion. Patient not interested in EGD at this time.

## 2013-10-22 NOTE — Progress Notes (Signed)
Primary Care Physician:  Leonides Grills, MD  Primary Gastroenterologist:  Garfield Cornea, MD   Chief Complaint  Patient presents with  . Colonoscopy    HPI:  Benjamin [name] is a 63 y.o. male here to schedule his first ever colonoscopy. He has had mild constipation which he relates to his heart medication and narcotics. Takes colace regularly. Denies any melena, brbpr. Rare acid reflux since he has made positive dietary changes. Never really had heartburn. Previously had more regurgitation. Doing well now. No solid food dysphagia. Occasionally gets the hiccups when taking his medications. Zantac only prn. Appetite good. Takes valium and vicodin once daily.  Current Outpatient Prescriptions  Medication Sig Dispense Refill  . allopurinol (ZYLOPRIM) 100 MG tablet Take 100 mg by mouth daily.      [name] amLODipine (NORVASC) 10 MG tablet Take 1 tablet (10 mg total) by mouth daily.  30 tablet  6  . COD LIVER OIL PO Take by mouth.      . diazepam (VALIUM) 10 MG tablet Take 10 mg by mouth every 6 (six) hours as needed for anxiety.      . docusate sodium (COLACE) 100 MG capsule Take 1 capsule (100 mg total) by mouth at bedtime.  30 capsule  3  . glimepiride (AMARYL) 4 MG tablet Take 4 mg by mouth 2 (two) times daily.      . hydrochlorothiazide (HYDRODIURIL) 25 MG tablet Take 1 tablet (25 mg total) by mouth daily.  30 tablet  6  . HYDROcodone-acetaminophen (NORCO/VICODIN) 5-325 MG per tablet Take 1 tablet by mouth every 6 (six) hours as needed for pain.      [name] losartan (COZAAR) 100 MG tablet TAKE ONE TABLET BY MOUTH ONCE DAILY  30 tablet  6  . metFORMIN (GLUCOPHAGE) 500 MG tablet Take 1,000 mg by mouth 2 (two) times daily with a meal.      . metoprolol (LOPRESSOR) 100 MG tablet Take 1 tablet (100 mg total) by mouth 2 (two) times daily.  60 tablet  6  . naproxen (NAPROSYN) 500 MG tablet Take 500 mg by mouth 2 (two) times daily with a meal.       . potassium chloride SA (K-DUR,KLOR-CON) 20 MEQ tablet Take  2 tablets (40 mEq total) by mouth daily.  60 tablet  6  . ranitidine (ZANTAC) 150 MG tablet Take 300 mg by mouth daily as needed.        No current facility-administered medications for this visit.    Allergies as of 10/22/2013 - Review Complete 10/22/2013  Allergen Reaction Noted  . Niacin-lovastatin er Other (See Comments) 10/18/2011  . Zocor [simvastatin - high dose] Other (See Comments) 10/18/2011    Past Medical History  Diagnosis Date  . Essential hypertension, benign   . Type 2 diabetes mellitus   . Mixed hyperlipidemia     Statin intolerance  . Gout   . Sleep apnea     questionable. uses oxygen at bedtime at times. no formal sleep study.  . Rotator cuff tear     chronic pain    Past Surgical History  Procedure Laterality Date  . Appendectomy      Family History  Problem Relation Age of Onset  . Diabetes type II Mother   . Colon cancer Neg Hx     History   Social History  . Marital Status: Married    Spouse Name: N/A    Number of Children: 2  . Years of Education: N/A  Occupational History  . Clinical biochemist   .     Social History Main Topics  . Smoking status: Former Smoker    Types: Cigarettes  . Smokeless tobacco: Never Used     Comment: quit 35 years ago  . Alcohol Use: No     Comment: None in over a year. prior to that 1-2 beers couple times per week.  . Drug Use: No  . Sexual Activity: Not on file   Other Topics Concern  . Not on file   Social History Narrative  . No narrative on file      ROS:  General: Negative for anorexia, weight loss, fever, chills, fatigue, weakness. Eyes: Negative for vision changes.  ENT: Negative for hoarseness, difficulty swallowing , nasal congestion. CV: Negative for chest pain, angina, palpitations, dyspnea on exertion, peripheral edema.  Respiratory: Negative for dyspnea at rest, dyspnea on exertion, cough, sputum, wheezing.  GI: See history of present illness. GU:  Negative for dysuria, hematuria,  urinary incontinence, urinary frequency, nocturnal urination.  MS: Negative for joint pain, low back pain.  Derm: Negative for rash or itching.  Neuro: Negative for weakness, abnormal sensation, seizure, frequent headaches, memory loss, confusion.  Psych: Negative for anxiety, depression, suicidal ideation, hallucinations.  Endo: Negative for unusual weight change.  Heme: Negative for bruising or bleeding. Allergy: Negative for rash or hives.    Physical Examination:  BP 149/81  Pulse 62  Temp(Src) 98.3 F (36.8 C) (Oral)  Ht 6\' 2"  (1.88 m)  Wt 259 lb 9.6 oz (117.754 kg)  BMI 33.32 kg/m2   General: Well-nourished, well-developed in no acute distress.  Head: Normocephalic, atraumatic.   Eyes: Conjunctiva pink, no icterus. Mouth: Oropharyngeal mucosa moist and pink , no lesions erythema or exudate. Neck: Supple without thyromegaly, masses, or lymphadenopathy.  Lungs: Clear to auscultation bilaterally.  Heart: Regular rate and rhythm, no murmurs rubs or gallops.  Abdomen: Bowel sounds are normal, nontender, nondistended, no hepatosplenomegaly or masses, no abdominal bruits or    hernia , no rebound or guarding.   Rectal: not performed. Extremities: No lower extremity edema. No clubbing or deformities.  Neuro: Alert and oriented x 4 , grossly normal neurologically.  Skin: Warm and dry, no rash or jaundice.   Psych: Alert and cooperative, normal mood and affect.

## 2013-10-22 NOTE — Patient Instructions (Signed)
1. If you are not having daily bowel movements, try Miralax one capful daily as needed. This is especially important leading up to your colonoscopy. 2. Colonoscopy as planned. See separate instructions.

## 2013-10-26 ENCOUNTER — Encounter (HOSPITAL_COMMUNITY): Payer: Self-pay | Admitting: Pharmacy Technician

## 2013-10-26 NOTE — Progress Notes (Signed)
cc'd to pcp 

## 2013-10-29 ENCOUNTER — Ambulatory Visit (HOSPITAL_COMMUNITY)
Admission: RE | Admit: 2013-10-29 | Discharge: 2013-10-29 | Disposition: A | Discharge status: Home / Self Care | Payer: BC Managed Care – PPO | Source: Ambulatory Visit | Attending: Internal Medicine | Admitting: Internal Medicine

## 2013-10-29 ENCOUNTER — Encounter (HOSPITAL_COMMUNITY)
Admission: RE | Disposition: A | Discharge status: Home / Self Care | Payer: Self-pay | Source: Ambulatory Visit | Attending: Internal Medicine

## 2013-10-29 ENCOUNTER — Encounter (HOSPITAL_COMMUNITY): Payer: Self-pay | Admitting: *Deleted

## 2013-10-29 DIAGNOSIS — E119 Type 2 diabetes mellitus without complications: Secondary | ICD-10-CM | POA: Insufficient documentation

## 2013-10-29 DIAGNOSIS — E782 Mixed hyperlipidemia: Secondary | ICD-10-CM | POA: Insufficient documentation

## 2013-10-29 DIAGNOSIS — D126 Benign neoplasm of colon, unspecified: Secondary | ICD-10-CM | POA: Insufficient documentation

## 2013-10-29 DIAGNOSIS — Z1211 Encounter for screening for malignant neoplasm of colon: Secondary | ICD-10-CM

## 2013-10-29 DIAGNOSIS — I10 Essential (primary) hypertension: Secondary | ICD-10-CM | POA: Insufficient documentation

## 2013-10-29 DIAGNOSIS — Z01812 Encounter for preprocedural laboratory examination: Secondary | ICD-10-CM | POA: Insufficient documentation

## 2013-10-29 DIAGNOSIS — K5730 Diverticulosis of large intestine without perforation or abscess without bleeding: Secondary | ICD-10-CM | POA: Insufficient documentation

## 2013-10-29 HISTORY — PX: COLONOSCOPY: SHX5424

## 2013-10-29 LAB — GLUCOSE, CAPILLARY: Glucose-Capillary: 107 mg/dL — ABNORMAL HIGH (ref 70–99)

## 2013-10-29 SURGERY — COLONOSCOPY
Anesthesia: Moderate Sedation

## 2013-10-29 MED ORDER — ONDANSETRON HCL 4 MG/2ML IJ SOLN
INTRAMUSCULAR | Status: DC | PRN
  Administered 2013-10-29: 4 mg via INTRAVENOUS

## 2013-10-29 MED ORDER — MIDAZOLAM HCL 5 MG/5ML IJ SOLN
INTRAMUSCULAR | Status: DC | PRN
  Administered 2013-10-29: 1 mg via INTRAVENOUS
  Administered 2013-10-29: 2 mg via INTRAVENOUS
  Administered 2013-10-29 (×3): 1 mg via INTRAVENOUS

## 2013-10-29 MED ORDER — SODIUM CHLORIDE 0.9 % IJ SOLN
INTRAMUSCULAR | Status: DC | PRN
  Administered 2013-10-29: 25 mL via INTRAVENOUS

## 2013-10-29 MED ORDER — PROMETHAZINE HCL 25 MG/ML IJ SOLN
25.0000 mg | Freq: Once | INTRAMUSCULAR | Status: AC
  Administered 2013-10-29: 25 mg via INTRAVENOUS
  Filled 2013-10-29: qty 1

## 2013-10-29 MED ORDER — SODIUM CHLORIDE 0.9 % IJ SOLN
INTRAMUSCULAR | Status: AC
  Filled 2013-10-29: qty 10

## 2013-10-29 MED ORDER — ONDANSETRON HCL 4 MG/2ML IJ SOLN
INTRAMUSCULAR | Status: AC
  Filled 2013-10-29: qty 2

## 2013-10-29 MED ORDER — STERILE WATER FOR IRRIGATION IR SOLN
Status: DC | PRN
  Administered 2013-10-29: 14:00:00

## 2013-10-29 MED ORDER — MEPERIDINE HCL 100 MG/ML IJ SOLN
INTRAMUSCULAR | Status: DC | PRN
Start: 2013-10-29 — End: 2013-10-29
  Administered 2013-10-29: 25 mg via INTRAVENOUS
  Administered 2013-10-29: 50 mg via INTRAVENOUS
  Administered 2013-10-29: 25 mg via INTRAVENOUS

## 2013-10-29 MED ORDER — MIDAZOLAM HCL 5 MG/5ML IJ SOLN
INTRAMUSCULAR | Status: AC
  Filled 2013-10-29: qty 10

## 2013-10-29 MED ORDER — SODIUM CHLORIDE 0.9 % IV SOLN
INTRAVENOUS | Status: DC
  Administered 2013-10-29: 1000 mL via INTRAVENOUS

## 2013-10-29 MED ORDER — MEPERIDINE HCL 100 MG/ML IJ SOLN
INTRAMUSCULAR | Status: AC
  Filled 2013-10-29: qty 2

## 2013-10-29 NOTE — H&P (View-Only) (Signed)
Primary Care Physician:  MCGOUGH,WILLIAM M, MD  Primary Gastroenterologist:  Michael Rourk, MD   Chief Complaint  Patient presents with  . Colonoscopy    HPI:  Benjamin [name] is a 62 y.o. male here to schedule his first ever colonoscopy. He has had mild constipation which he relates to his heart medication and narcotics. Takes colace regularly. Denies any melena, brbpr. Rare acid reflux since he has made positive dietary changes. Never really had heartburn. Previously had more regurgitation. Doing well now. No solid food dysphagia. Occasionally gets the hiccups when taking his medications. Zantac only prn. Appetite good. Takes valium and vicodin once daily.  Current Outpatient Prescriptions  Medication Sig Dispense Refill  . allopurinol (ZYLOPRIM) 100 MG tablet Take 100 mg by mouth daily.      . amLODipine (NORVASC) 10 MG tablet Take 1 tablet (10 mg total) by mouth daily.  30 tablet  6  . COD LIVER OIL PO Take by mouth.      . diazepam (VALIUM) 10 MG tablet Take 10 mg by mouth every 6 (six) hours as needed for anxiety.      . docusate sodium (COLACE) 100 MG capsule Take 1 capsule (100 mg total) by mouth at bedtime.  30 capsule  3  . glimepiride (AMARYL) 4 MG tablet Take 4 mg by mouth 2 (two) times daily.      . hydrochlorothiazide (HYDRODIURIL) 25 MG tablet Take 1 tablet (25 mg total) by mouth daily.  30 tablet  6  . HYDROcodone-acetaminophen (NORCO/VICODIN) 5-325 MG per tablet Take 1 tablet by mouth every 6 (six) hours as needed for pain.      . losartan (COZAAR) 100 MG tablet TAKE ONE TABLET BY MOUTH ONCE DAILY  30 tablet  6  . metFORMIN (GLUCOPHAGE) 500 MG tablet Take 1,000 mg by mouth 2 (two) times daily with a meal.      . metoprolol (LOPRESSOR) 100 MG tablet Take 1 tablet (100 mg total) by mouth 2 (two) times daily.  60 tablet  6  . naproxen (NAPROSYN) 500 MG tablet Take 500 mg by mouth 2 (two) times daily with a meal.       . potassium chloride SA (K-DUR,KLOR-CON) 20 MEQ tablet Take  2 tablets (40 mEq total) by mouth daily.  60 tablet  6  . ranitidine (ZANTAC) 150 MG tablet Take 300 mg by mouth daily as needed.        No current facility-administered medications for this visit.    Allergies as of 10/22/2013 - Review Complete 10/22/2013  Allergen Reaction Noted  . Niacin-lovastatin er Other (See Comments) 10/18/2011  . Zocor [simvastatin - high dose] Other (See Comments) 10/18/2011    Past Medical History  Diagnosis Date  . Essential hypertension, benign   . Type 2 diabetes mellitus   . Mixed hyperlipidemia     Statin intolerance  . Gout   . Sleep apnea     questionable. uses oxygen at bedtime at times. no formal sleep study.  . Rotator cuff tear     chronic pain    Past Surgical History  Procedure Laterality Date  . Appendectomy      Family History  Problem Relation Age of Onset  . Diabetes type II Mother   . Colon cancer Neg Hx     History   Social History  . Marital Status: Married    Spouse Name: N/A    Number of Children: 2  . Years of Education: N/A     Occupational History  . Clinical biochemist   .     Social History Main Topics  . Smoking status: Former Smoker    Types: Cigarettes  . Smokeless tobacco: Never Used     Comment: quit 35 years ago  . Alcohol Use: No     Comment: None in over a year. prior to that 1-2 beers couple times per week.  . Drug Use: No  . Sexual Activity: Not on file   Other Topics Concern  . Not on file   Social History Narrative  . No narrative on file      ROS:  General: Negative for anorexia, weight loss, fever, chills, fatigue, weakness. Eyes: Negative for vision changes.  ENT: Negative for hoarseness, difficulty swallowing , nasal congestion. CV: Negative for chest pain, angina, palpitations, dyspnea on exertion, peripheral edema.  Respiratory: Negative for dyspnea at rest, dyspnea on exertion, cough, sputum, wheezing.  GI: See history of present illness. GU:  Negative for dysuria, hematuria,  urinary incontinence, urinary frequency, nocturnal urination.  MS: Negative for joint pain, low back pain.  Derm: Negative for rash or itching.  Neuro: Negative for weakness, abnormal sensation, seizure, frequent headaches, memory loss, confusion.  Psych: Negative for anxiety, depression, suicidal ideation, hallucinations.  Endo: Negative for unusual weight change.  Heme: Negative for bruising or bleeding. Allergy: Negative for rash or hives.    Physical Examination:  BP 149/81  Pulse 62  Temp(Src) 98.3 F (36.8 C) (Oral)  Ht 6\' 2"  (1.88 m)  Wt 259 lb 9.6 oz (117.754 kg)  BMI 33.32 kg/m2   General: Well-nourished, well-developed in no acute distress.  Head: Normocephalic, atraumatic.   Eyes: Conjunctiva pink, no icterus. Mouth: Oropharyngeal mucosa moist and pink , no lesions erythema or exudate. Neck: Supple without thyromegaly, masses, or lymphadenopathy.  Lungs: Clear to auscultation bilaterally.  Heart: Regular rate and rhythm, no murmurs rubs or gallops.  Abdomen: Bowel sounds are normal, nontender, nondistended, no hepatosplenomegaly or masses, no abdominal bruits or    hernia , no rebound or guarding.   Rectal: not performed. Extremities: No lower extremity edema. No clubbing or deformities.  Neuro: Alert and oriented x 4 , grossly normal neurologically.  Skin: Warm and dry, no rash or jaundice.   Psych: Alert and cooperative, normal mood and affect.

## 2013-10-29 NOTE — Op Note (Signed)
[facility] [address] [place], [address]   COLONOSCOPY PROCEDURE REPORT  PATIENT: Benjamin, [name].  MR#:         [id number] BIRTHDATE: [birth year] , 62  yrs. old GENDER: Male ENDOSCOPIST: R.  Garfield Cornea, MD FACP FACG REFERRED BY:  Elsie Lincoln, M.D. PROCEDURE DATE:  10/29/2013 PROCEDURE:     Colonoscopy with multiple snare polypectomies/saline-assisted piecemealed debulking piecemeal snare polypectomy  INDICATIONS: First-ever average risk colorectal cancer screening examination  INFORMED CONSENT:  The risks, benefits, alternatives and imponderables including but not limited to bleeding, perforation as well as the possibility of a missed lesion have been reviewed.  The potential for biopsy, lesion removal, etc. have also been discussed.  Questions have been answered.  All parties agreeable. Please see the history and physical in the medical record for more information.  MEDICATIONS: Versed 6 mg IV and Demerol 100 mg IV in divided doses. Zofran 4 mg IV.  DESCRIPTION OF PROCEDURE:  After a digital rectal exam was performed, the EC-[id number] ([id number])  colonoscope was advanced from the anus through the rectum and colon to the area of the cecum, ileocecal valve and appendiceal orifice.  The cecum was deeply intubated.  These structures were well-seen and photographed for the record.  From the level of the cecum and ileocecal valve, the scope was slowly and cautiously withdrawn.  The mucosal surfaces were not entirely seen because of the poor prep  The scope was pulled down into the rectum where an examination was  performed.    FINDINGS:  Preparation inadequate as far as polyp detection is concerned.  grossly normal rectum. Scattered pancolonic diverticula. Patient had a 6 mm pedunculated polyp in the mid sigmoid segment. The patient had a 4 x 5 cm carpet polyp in the proximal ascending  colon approximately 5 cm distal to the ileocecal valve. Please see  above photos. The remainder of the colonic mucosa appeared grossly normal, however, prep again was poor.  A significant lesions could have easily been obscured by the poor preparation.  THERAPEUTIC / DIAGNOSTIC MANEUVERS PERFORMED:  The sigmoid polyp was cold snared and recovered for the pathologist. The ascending colon polyp was lifted away of the colonic wall with multiple injections of normal saline through the sclero- needle.  By the conclusion of the procedure, approximately 25 cc of normal saline was utilized. The lesion lifted nicely away from the colonic wall at each injcetion point. I utilized the snare and removed perhaps about 80% of the lesion. I also used the  smaller non-rotatable snare. There was residual polyp tissue remaining spanning across the fold at the proximal extent of the lesion. Because of the relatively poor prep and ongoing colonic effluent coming out of the ileocecal valve,  I did not feel like it was safe to  remove any more of the polyp during today's session. There was good hemostasis maintained throughout the procedure. The ascending  colon lesion was not tattooed as it was well demarcated 5 cm directly distal to the ileocecal valve.  COMPLICATIONS: None  CECAL WITHDRAWAL TIME:  one hour 14 minute  IMPRESSION:   Sigmoid polyp-status post cold snare removal. Large sessile ascending  colon polyp. Debulked with saline assisted snare polypectomy as described above Pancolonic diverticulosis. Inadequate preparation precluded complete examination the  RECOMMENDATIONS: If the patient is found to have high-grade pathology in the large ascending colon polyp specimens, he will need a right hemicolectomy. However, prior surgery he will need a thorough colonoscopy in the  presence of a good preparation prior to going to the OR. If there is not high-grade pathology,he still needs to come back in short interval for a thorough colonoscopy in the setting of  agood  preparation to complete removal of the above lesion and to survey the remainder of his colon for occult lesions.  I thoroughly discussed these findings with patient and family members  No aspirin NSAIDs for the next 10 days.  _______________________________ eSigned:  R. Garfield Cornea, MD FACP [facility] 10/29/2013 4:03 PM   CC:    PATIENT NAME:  Benjamin, [name]. MR#: [id number]

## 2013-10-29 NOTE — Interval H&P Note (Signed)
History and Physical Interval Note:  10/29/2013 2:02 PM  Benjamin [name]  has presented today for surgery, with the diagnosis of SCREENING COLONOSCOPY  The various methods of treatment have been discussed with the patient and family. After consideration of risks, benefits and other options for treatment, the patient has consented to  Procedure(s) with comments: COLONOSCOPY (N/A) - 2:15 as a surgical intervention .  The patient's history has been reviewed, patient examined, no change in status, stable for surgery.  I have reviewed the patient's chart and labs.  Questions were answered to the patient's satisfaction.     No change. Colonoscopy per plan.The risks, benefits, limitations, alternatives and imponderables have been reviewed with the patient. Questions have been answered. All parties are agreeable.   Manus Rudd

## 2013-10-29 NOTE — Discharge Instructions (Addendum)
Colonoscopy Discharge Instructions  Read the instructions outlined below and refer to this sheet in the next few weeks. These discharge instructions provide you with general information on caring for yourself after you leave the hospital. Your doctor may also give you specific instructions. While your treatment has been planned according to the most current medical practices available, unavoidable complications occasionally occur. If you have any problems or questions after discharge, call Dr. [name] at [phone]. ACTIVITY  You may resume your regular activity, but move at a slower pace for the next 24 hours.   Take frequent rest periods for the next 24 hours.   Walking will help get rid of the air and reduce the bloated feeling in your belly (abdomen).   No driving for 24 hours (because of the medicine (anesthesia) used during the test).    Do not sign any important legal documents or operate any machinery for 24 hours (because of the anesthesia used during the test).  NUTRITION  Drink plenty of fluids.   You may resume your normal diet as instructed by your doctor.   Begin with a light meal and progress to your normal diet. Heavy or fried foods are harder to digest and may make you feel sick to your stomach (nauseated).   Avoid alcoholic beverages for 24 hours or as instructed.  MEDICATIONS  You may resume your normal medications unless your doctor tells you otherwise.  WHAT YOU CAN EXPECT TODAY  Some feelings of bloating in the abdomen.   Passage of more gas than usual.   Spotting of blood in your stool or on the toilet paper.  IF YOU HAD POLYPS REMOVED DURING THE COLONOSCOPY:  No aspirin products for 7 days or as instructed.   No alcohol for 7 days or as instructed.   Eat a soft diet for the next 24 hours.  FINDING OUT THE RESULTS OF YOUR TEST Not all test results are available during your visit. If your test results are not back during the visit, make an appointment  with your caregiver to find out the results. Do not assume everything is normal if you have not heard from your caregiver or the medical facility. It is important for you to follow up on all of your test results.  SEEK IMMEDIATE MEDICAL ATTENTION IF:  You have more than a spotting of blood in your stool.   Your belly is swollen (abdominal distention).   You are nauseated or vomiting.   You have a temperature over 101.   You have abdominal pain or discomfort that is severe or gets worse throughout the day.    No Naprosyn, ibuprofen or any form of aspirin for the next 10 days  Polyp information provided  Diverticulosis information provided  Further recommendations to follow pending review of pathology report  You will need a repeat colonoscopy in the near future. You may need surgery.  Diverticulosis Diverticulosis is a common condition that develops when small pouches (diverticula) form in the wall of the colon. The risk of diverticulosis increases with age. It happens more often in people who eat a low-fiber diet. Most individuals with diverticulosis have no symptoms. Those individuals with symptoms usually experience abdominal pain, constipation, or loose stools (diarrhea). HOME CARE INSTRUCTIONS   Increase the amount of fiber in your diet as directed by your caregiver or dietician. This may reduce symptoms of diverticulosis.  Your caregiver may recommend taking a dietary fiber supplement.  Drink at least 6 to 8 glasses of water  each day to prevent constipation.  Try not to strain when you have a bowel movement.  Your caregiver may recommend avoiding nuts and seeds to prevent complications, although this is still an uncertain benefit.  Only take over-the-counter or prescription medicines for pain, discomfort, or fever as directed by your caregiver. FOODS WITH HIGH FIBER CONTENT INCLUDE:  Fruits. Apple, peach, pear, tangerine, raisins, prunes.  Vegetables. Brussels sprouts,  asparagus, broccoli, cabbage, carrot, cauliflower, romaine lettuce, spinach, summer squash, tomato, winter squash, zucchini.  Starchy Vegetables. Baked beans, kidney beans, lima beans, split peas, lentils, potatoes (with skin).  Grains. Whole wheat bread, brown rice, bran flake cereal, plain oatmeal, white rice, shredded wheat, bran muffins. SEEK IMMEDIATE MEDICAL CARE IF:   You develop increasing pain or severe bloating.  You have an oral temperature above 102 F (38.9 C), not controlled by medicine.  You develop vomiting or bowel movements that are bloody or black. Document Released: 05/31/2004 Document Revised: 11/26/2011 Document Reviewed: 02/01/2010 [facility] Patient Information 2014 [address]. Colon Polyps Polyps are lumps of extra tissue growing inside the body. Polyps can grow in the large intestine (colon). Most colon polyps are noncancerous (benign). However, some colon polyps can become cancerous over time. Polyps that are larger than a pea may be harmful. To be safe, caregivers remove and test all polyps. CAUSES  Polyps form when mutations in the genes cause your cells to grow and divide even though no more tissue is needed. RISK FACTORS There are a number of risk factors that can increase your chances of getting colon polyps. They include:  Being older than 50 years.  Family history of colon polyps or colon cancer.  Long-term colon diseases, such as colitis or Crohn disease.  Being overweight.  Smoking.  Being inactive.  Drinking too much alcohol. SYMPTOMS  Most small polyps do not cause symptoms. If symptoms are present, they may include:  Blood in the stool. The stool may look dark red or black.  Constipation or diarrhea that lasts longer than 1 week. DIAGNOSIS People often do not know they have polyps until their caregiver finds them during a regular checkup. Your caregiver can use 4 tests to check for polyps:  Digital rectal exam. The caregiver wears  gloves and feels inside the rectum. This test would find polyps only in the rectum.  Barium enema. The caregiver puts a liquid called barium into your rectum before taking X-rays of your colon. Barium makes your colon look white. Polyps are dark, so they are easy to see in the X-ray pictures.  Sigmoidoscopy. A thin, flexible tube (sigmoidoscope) is placed into your rectum. The sigmoidoscope has a light and tiny camera in it. The caregiver uses the sigmoidoscope to look at the last third of your colon.  Colonoscopy. This test is like sigmoidoscopy, but the caregiver looks at the entire colon. This is the most common method for finding and removing polyps. TREATMENT  Any polyps will be removed during a sigmoidoscopy or colonoscopy. The polyps are then tested for cancer. PREVENTION  To help lower your risk of getting more colon polyps:  Eat plenty of fruits and vegetables. Avoid eating fatty foods.  Do not smoke.  Avoid drinking alcohol.  Exercise every day.  Lose weight if recommended by your caregiver.  Eat plenty of calcium and folate. Foods that are rich in calcium include milk, cheese, and broccoli. Foods that are rich in folate include chickpeas, kidney beans, and spinach. HOME CARE INSTRUCTIONS Keep all follow-up appointments as directed  by your caregiver. You may need periodic exams to check for polyps. SEEK MEDICAL CARE IF: You notice bleeding during a bowel movement. Document Released: 05/30/2004 Document Revised: 11/26/2011 Document Reviewed: 11/13/2011 [facility] Patient Information 2014 [place].

## 2013-11-03 ENCOUNTER — Encounter (HOSPITAL_COMMUNITY): Payer: Self-pay | Admitting: Internal Medicine

## 2013-11-03 ENCOUNTER — Encounter: Payer: Self-pay | Admitting: Internal Medicine

## 2013-11-04 ENCOUNTER — Telehealth: Payer: Self-pay

## 2013-11-04 NOTE — Telephone Encounter (Signed)
Recommendations  Has always been 3 months

## 2013-11-04 NOTE — Telephone Encounter (Signed)
Letter from: [name]  Reason for Letter: Results Review  Send letter to patient.  Send copy of letter with path to referring provider and PCP.  Needs tcs in 3 months w extra 1/2 day of cl liquids, extra 2 liters of golytely; end of day procedure; schedule an extra 30 minutes

## 2013-11-04 NOTE — Telephone Encounter (Signed)
[name] aware.   NIC for 3 month TCS

## 2013-11-04 NOTE — Telephone Encounter (Signed)
Letter mailed to pt.  

## 2013-11-04 NOTE — Telephone Encounter (Signed)
LMOM to call back

## 2013-11-04 NOTE — Telephone Encounter (Signed)
Results Cc to PCP  

## 2013-11-04 NOTE — Telephone Encounter (Signed)
[name] called today because [facility] told them that he would need another TCS in three weeks but the letter states three months. Please advise which one it needs to be?

## 2014-02-02 ENCOUNTER — Ambulatory Visit (INDEPENDENT_AMBULATORY_CARE_PROVIDER_SITE_OTHER): Payer: BC Managed Care – PPO | Admitting: Internal Medicine

## 2014-02-02 ENCOUNTER — Encounter (INDEPENDENT_AMBULATORY_CARE_PROVIDER_SITE_OTHER): Payer: Self-pay

## 2014-02-02 ENCOUNTER — Other Ambulatory Visit: Payer: Self-pay | Admitting: Internal Medicine

## 2014-02-02 ENCOUNTER — Encounter: Payer: Self-pay | Admitting: Internal Medicine

## 2014-02-02 VITALS — BP 148/82 | HR 57 | Temp 97.8°F | Ht 73.0 in | Wt 258.0 lb

## 2014-02-02 DIAGNOSIS — Z86010 Personal history of colonic polyps: Secondary | ICD-10-CM

## 2014-02-02 DIAGNOSIS — R1319 Other dysphagia: Secondary | ICD-10-CM

## 2014-02-02 NOTE — Patient Instructions (Signed)
Schedule EGD with ED - dysphagia  "         "     Colonoscopy F/U polyps  - Linzess 290 daily x 3 days prior to procedure, Movie prep - split - 2 full days of clear liquids; enema x 2 day of procedure       Schedule extra 30 minutes for both procedures - early am slot request  Further recommendations to follow

## 2014-02-02 NOTE — Progress Notes (Signed)
  Primary Care Physician:  MCGOUGH,WILLIAM M, MD Primary Gastroenterologist:  Dr. Rourk  Pre-Procedure History & Physical: HPI:  Benjamin [name] is a 62 y.o. male here for set up followup colonoscopy but had multiple high-grade adenomas removed his colon in February. Poor prep. He's done well. He is being brought back us up the repeat colonoscopy in the setting of a better preparation. Also has esophageal dysphagia to solids. He desires EGD as discussed previously. He did not want workup previously but now is interested.  Past Medical History  Diagnosis Date  . Essential hypertension, benign   . Type 2 diabetes mellitus   . Mixed hyperlipidemia     Statin intolerance  . Gout   . Sleep apnea     questionable. uses oxygen at bedtime at times. no formal sleep study.  . Rotator cuff tear     chronic pain  . Tubular adenoma     Past Surgical History  Procedure Laterality Date  . Appendectomy    . Colonoscopy N/A 10/29/2013    Dr. Rourk- sigmoid polyp status post cold snare removal. large sessile ascending colon polyp. debulked with saline assisted snare polypectomy . pancolonic diverticulosis. inadequate prep. tubular adenoma on bx    Prior to Admission medications   Medication Sig Start Date End Date Taking? Authorizing Provider  allopurinol (ZYLOPRIM) 100 MG tablet Take 100 mg by mouth daily.   Yes Historical Provider, MD  amLODipine (NORVASC) 10 MG tablet Take 1 tablet (10 mg total) by mouth daily. 09/22/13  Yes Kathryn M Lawrence, NP  diazepam (VALIUM) 10 MG tablet Take 10 mg by mouth every 6 (six) hours as needed for anxiety.   Yes Historical Provider, MD  docusate sodium (COLACE) 100 MG capsule Take 1 capsule (100 mg total) by mouth at bedtime. 03/17/13  Yes Kathryn M Lawrence, NP  glimepiride (AMARYL) 4 MG tablet Take 4 mg by mouth 2 (two) times daily.   Yes Historical Provider, MD  hydrochlorothiazide (HYDRODIURIL) 25 MG tablet Take 1 tablet (25 mg total) by mouth daily. 09/22/13   Yes Kathryn M Lawrence, NP  HYDROcodone-acetaminophen (NORCO) 10-325 MG per tablet Take 1 tablet by mouth every 6 (six) hours as needed. 10/15/13  Yes Historical Provider, MD  losartan (COZAAR) 100 MG tablet TAKE ONE TABLET BY MOUTH ONCE DAILY 09/09/13  Yes Kathryn M Lawrence, NP  metFORMIN (GLUCOPHAGE) 500 MG tablet Take 500 mg by mouth 2 (two) times daily with a meal.    Yes Historical Provider, MD  metoprolol (LOPRESSOR) 100 MG tablet Take 1 tablet (100 mg total) by mouth 2 (two) times daily. 09/22/13  Yes Kathryn M Lawrence, NP  potassium chloride SA (K-DUR,KLOR-CON) 20 MEQ tablet Take 20 mEq by mouth daily. 01/20/13  Yes Kathryn M Lawrence, NP  ranitidine (ZANTAC) 150 MG tablet Take 300 mg by mouth daily.    Yes Historical Provider, MD  polyethylene glycol-electrolytes (TRILYTE) 420 G solution Take 4,000 mLs by mouth as directed. 10/22/13   Robert M Rourk, MD    Allergies as of 02/02/2014 - Review Complete 02/02/2014  Allergen Reaction Noted  . Niacin-lovastatin er Other (See Comments) 10/18/2011  . Zocor [simvastatin - high dose] Other (See Comments) 10/18/2011    Family History  Problem Relation Age of Onset  . Diabetes type II Mother   . Colon cancer Neg Hx     History   Social History  . Marital Status: Married    Spouse Name: N/A    Number of Children:   2  . Years of Education: N/A   Occupational History  . Electrician   .     Social History Main Topics  . Smoking status: Former Smoker    Types: Cigarettes  . Smokeless tobacco: Never Used     Comment: quit 35 years ago  . Alcohol Use: No     Comment: None in over a year. prior to that 1-2 beers couple times per week.  . Drug Use: No  . Sexual Activity: Not on file   Other Topics Concern  . Not on file   Social History Narrative  . No narrative on file    Review of Systems: See HPI, otherwise negative ROS  Physical Exam: BP 148/82  Pulse 57  Temp(Src) 97.8 F (36.6 C) (Oral)  Ht 6' 1" (1.854 m)  Wt 258 lb  (117.028 kg)  BMI 34.05 kg/m2 General:   Alert,  Well-developed, well-nourished, pleasant and cooperative in NAD. Accompanied by his wife.  Skin:  Intact without significant lesions or rashes. Eyes:  Sclera clear, no icterus.   Conjunctiva pink. Ears:  Normal auditory acuity. Nose:  No deformity, discharge,  or lesions. Mouth:  No deformity or lesions. Neck:  Supple; no masses or thyromegaly. No significant cervical adenopathy. Lungs:  Clear throughout to auscultation.   No wheezes, crackles, or rhonchi. No acute distress. Heart:  Regular rate and rhythm; no murmurs, clicks, rubs,  or gallops. Abdomen: Non-distended, normal bowel sounds.  Soft and nontender without appreciable mass or hepatosplenomegaly.  Pulses:  Normal pulses noted. Extremities:  Without clubbing or edema.   Impression: 62-year-old gentleman multiple high-grade adenomas removed as: 3 months ago. Poor prep. Needs to have a repeat colonoscopy to make sure all polyp tissue removed. Esophageal dysphagia desires further workup. Previously declined. No problems related to his prior colonoscopy, otherwise.   Recommendations:   Colonoscopy and EGD with esophageal dilation as appropriate/feasible in the near future.The risks, benefits, limitations, imponderables and alternatives regarding both EGD and colonoscopy have been reviewed with the patient. Questions have been answered. All parties agreeable.        Notice: This dictation was prepared with Dragon dictation along with smaller phrase technology. Any transcriptional errors that result from this process are unintentional and may not be corrected upon review. 

## 2014-02-03 ENCOUNTER — Encounter (HOSPITAL_COMMUNITY): Payer: Self-pay | Admitting: Pharmacy Technician

## 2014-02-05 NOTE — Telephone Encounter (Signed)
Pt had ov on 02/02/14

## 2014-02-09 ENCOUNTER — Other Ambulatory Visit: Payer: Self-pay | Admitting: Internal Medicine

## 2014-02-22 ENCOUNTER — Encounter (HOSPITAL_COMMUNITY)
Admission: RE | Disposition: A | Discharge status: Home / Self Care | Payer: Self-pay | Source: Ambulatory Visit | Attending: Internal Medicine

## 2014-02-22 ENCOUNTER — Ambulatory Visit (HOSPITAL_COMMUNITY)
Admission: RE | Admit: 2014-02-22 | Discharge: 2014-02-22 | Disposition: A | Discharge status: Home / Self Care | Payer: BC Managed Care – PPO | Source: Ambulatory Visit | Attending: Internal Medicine | Admitting: Internal Medicine

## 2014-02-22 ENCOUNTER — Encounter (HOSPITAL_COMMUNITY): Payer: Self-pay | Admitting: *Deleted

## 2014-02-22 DIAGNOSIS — D126 Benign neoplasm of colon, unspecified: Secondary | ICD-10-CM

## 2014-02-22 DIAGNOSIS — K2100 Gastro-esophageal reflux disease with esophagitis, without bleeding: Secondary | ICD-10-CM

## 2014-02-22 DIAGNOSIS — Z09 Encounter for follow-up examination after completed treatment for conditions other than malignant neoplasm: Secondary | ICD-10-CM | POA: Insufficient documentation

## 2014-02-22 DIAGNOSIS — Q393 Congenital stenosis and stricture of esophagus: Secondary | ICD-10-CM

## 2014-02-22 DIAGNOSIS — K222 Esophageal obstruction: Secondary | ICD-10-CM

## 2014-02-22 DIAGNOSIS — Q391 Atresia of esophagus with tracheo-esophageal fistula: Secondary | ICD-10-CM

## 2014-02-22 DIAGNOSIS — K5731 Diverticulosis of large intestine without perforation or abscess with bleeding: Secondary | ICD-10-CM

## 2014-02-22 DIAGNOSIS — Z79899 Other long term (current) drug therapy: Secondary | ICD-10-CM | POA: Insufficient documentation

## 2014-02-22 DIAGNOSIS — G4730 Sleep apnea, unspecified: Secondary | ICD-10-CM | POA: Insufficient documentation

## 2014-02-22 DIAGNOSIS — Z87891 Personal history of nicotine dependence: Secondary | ICD-10-CM | POA: Insufficient documentation

## 2014-02-22 DIAGNOSIS — E119 Type 2 diabetes mellitus without complications: Secondary | ICD-10-CM | POA: Insufficient documentation

## 2014-02-22 DIAGNOSIS — Z888 Allergy status to other drugs, medicaments and biological substances status: Secondary | ICD-10-CM | POA: Insufficient documentation

## 2014-02-22 DIAGNOSIS — K2960 Other gastritis without bleeding: Secondary | ICD-10-CM | POA: Insufficient documentation

## 2014-02-22 DIAGNOSIS — I10 Essential (primary) hypertension: Secondary | ICD-10-CM | POA: Insufficient documentation

## 2014-02-22 DIAGNOSIS — Z860100 Personal history of colon polyps, unspecified: Secondary | ICD-10-CM

## 2014-02-22 DIAGNOSIS — K259 Gastric ulcer, unspecified as acute or chronic, without hemorrhage or perforation: Secondary | ICD-10-CM

## 2014-02-22 DIAGNOSIS — M109 Gout, unspecified: Secondary | ICD-10-CM | POA: Insufficient documentation

## 2014-02-22 DIAGNOSIS — K5730 Diverticulosis of large intestine without perforation or abscess without bleeding: Secondary | ICD-10-CM | POA: Insufficient documentation

## 2014-02-22 DIAGNOSIS — R1319 Other dysphagia: Secondary | ICD-10-CM

## 2014-02-22 DIAGNOSIS — K449 Diaphragmatic hernia without obstruction or gangrene: Secondary | ICD-10-CM | POA: Insufficient documentation

## 2014-02-22 DIAGNOSIS — R1310 Dysphagia, unspecified: Secondary | ICD-10-CM | POA: Insufficient documentation

## 2014-02-22 HISTORY — PX: MALONEY DILATION: SHX5535

## 2014-02-22 HISTORY — PX: COLONOSCOPY: SHX5424

## 2014-02-22 HISTORY — PX: SAVORY DILATION: SHX5439

## 2014-02-22 HISTORY — PX: ESOPHAGOGASTRODUODENOSCOPY: SHX5428

## 2014-02-22 LAB — GLUCOSE, CAPILLARY: Glucose-Capillary: 152 mg/dL — ABNORMAL HIGH (ref 70–99)

## 2014-02-22 SURGERY — COLONOSCOPY
Anesthesia: Moderate Sedation

## 2014-02-22 MED ORDER — ONDANSETRON HCL 4 MG/2ML IJ SOLN
INTRAMUSCULAR | Status: AC
  Filled 2014-02-22: qty 2

## 2014-02-22 MED ORDER — LIDOCAINE VISCOUS 2 % MT SOLN
OROMUCOSAL | Status: DC | PRN
  Administered 2014-02-22: 3 mL via OROMUCOSAL

## 2014-02-22 MED ORDER — MEPERIDINE HCL 100 MG/ML IJ SOLN
INTRAMUSCULAR | Status: AC
  Filled 2014-02-22: qty 2

## 2014-02-22 MED ORDER — ONDANSETRON HCL 4 MG/2ML IJ SOLN
INTRAMUSCULAR | Status: DC | PRN
  Administered 2014-02-22: 4 mg via INTRAVENOUS

## 2014-02-22 MED ORDER — LIDOCAINE VISCOUS 2 % MT SOLN
OROMUCOSAL | Status: AC
  Filled 2014-02-22: qty 15

## 2014-02-22 MED ORDER — ATROPINE SULFATE 1 MG/ML IJ SOLN
INTRAMUSCULAR | Status: DC | PRN
  Administered 2014-02-22: .25 mg via INTRAVENOUS

## 2014-02-22 MED ORDER — MIDAZOLAM HCL 5 MG/5ML IJ SOLN
INTRAMUSCULAR | Status: AC
  Filled 2014-02-22: qty 10

## 2014-02-22 MED ORDER — MIDAZOLAM HCL 5 MG/5ML IJ SOLN
INTRAMUSCULAR | Status: DC | PRN
  Administered 2014-02-22 (×3): 1 mg via INTRAVENOUS
  Administered 2014-02-22: 0.5 mg via INTRAVENOUS
  Administered 2014-02-22: 1 mg via INTRAVENOUS
  Administered 2014-02-22: 0.5 mg via INTRAVENOUS
  Administered 2014-02-22 (×2): 1 mg via INTRAVENOUS
  Administered 2014-02-22: 2 mg via INTRAVENOUS

## 2014-02-22 MED ORDER — SODIUM CHLORIDE 0.9 % IJ SOLN
INTRAMUSCULAR | Status: DC | PRN
  Administered 2014-02-22: 12 mL

## 2014-02-22 MED ORDER — SODIUM CHLORIDE 0.9 % IV SOLN
INTRAVENOUS | Status: DC
  Administered 2014-02-22: 09:00:00 via INTRAVENOUS

## 2014-02-22 MED ORDER — ATROPINE SULFATE 1 MG/ML IJ SOLN
INTRAMUSCULAR | Status: AC
  Filled 2014-02-22: qty 1

## 2014-02-22 MED ORDER — SIMETHICONE 40 MG/0.6ML PO SUSP
ORAL | Status: DC | PRN
  Administered 2014-02-22: 10:00:00

## 2014-02-22 MED ORDER — MEPERIDINE HCL 100 MG/ML IJ SOLN
INTRAMUSCULAR | Status: DC | PRN
  Administered 2014-02-22 (×2): 25 mg via INTRAVENOUS
  Administered 2014-02-22: 50 mg via INTRAVENOUS

## 2014-02-22 MED ORDER — SPOT INK MARKER SYRINGE KIT
PACK | SUBMUCOSAL | Status: DC | PRN
  Administered 2014-02-22: 5 mL via SUBMUCOSAL

## 2014-02-22 NOTE — Discharge Instructions (Addendum)
Colonoscopy Discharge Instructions  Read the instructions outlined below and refer to this sheet in the next few weeks. These discharge instructions provide you with general information on caring for yourself after you leave the hospital. Your doctor may also give you specific instructions. While your treatment has been planned according to the most current medical practices available, unavoidable complications occasionally occur. If you have any problems or questions after discharge, call Dr. [name] at [phone]. ACTIVITY  You may resume your regular activity, but move at a slower pace for the next 24 hours.   Take frequent rest periods for the next 24 hours.   Walking will help get rid of the air and reduce the bloated feeling in your belly (abdomen).   No driving for 24 hours (because of the medicine (anesthesia) used during the test).    Do not sign any important legal documents or operate any machinery for 24 hours (because of the anesthesia used during the test).  NUTRITION  Drink plenty of fluids.   You may resume your normal diet as instructed by your doctor.   Begin with a light meal and progress to your normal diet. Heavy or fried foods are harder to digest and may make you feel sick to your stomach (nauseated).   Avoid alcoholic beverages for 24 hours or as instructed.  MEDICATIONS  You may resume your normal medications unless your doctor tells you otherwise.  WHAT YOU CAN EXPECT TODAY  Some feelings of bloating in the abdomen.   Passage of more gas than usual.   Spotting of blood in your stool or on the toilet paper.  IF YOU HAD POLYPS REMOVED DURING THE COLONOSCOPY:  No aspirin products for 7 days or as instructed.   No alcohol for 7 days or as instructed.   Eat a soft diet for the next 24 hours.  FINDING OUT THE RESULTS OF YOUR TEST Not all test results are available during your visit. If your test results are not back during the visit, make an appointment  with your caregiver to find out the results. Do not assume everything is normal if you have not heard from your caregiver or the medical facility. It is important for you to follow up on all of your test results.  SEEK IMMEDIATE MEDICAL ATTENTION IF:  You have more than a spotting of blood in your stool.   Your belly is swollen (abdominal distention).   You are nauseated or vomiting.   You have a temperature over 101.   You have abdominal pain or discomfort that is severe or gets worse throughout the day.   EGD Discharge instructions Please read the instructions outlined below and refer to this sheet in the next few weeks. These discharge instructions provide you with general information on caring for yourself after you leave the hospital. Your doctor may also give you specific instructions. While your treatment has been planned according to the most current medical practices available, unavoidable complications occasionally occur. If you have any problems or questions after discharge, please call your doctor. ACTIVITY  You may resume your regular activity but move at a slower pace for the next 24 hours.   Take frequent rest periods for the next 24 hours.   Walking will help expel (get rid of) the air and reduce the bloated feeling in your abdomen.   No driving for 24 hours (because of the anesthesia (medicine) used during the test).   You may shower.   Do not sign any  important legal documents or operate any machinery for 24 hours (because of the anesthesia used during the test).  NUTRITION  Drink plenty of fluids.   You may resume your normal diet.   Begin with a light meal and progress to your normal diet.   Avoid alcoholic beverages for 24 hours or as instructed by your caregiver.  MEDICATIONS  You may resume your normal medications unless your caregiver tells you otherwise.  WHAT YOU CAN EXPECT TODAY  You may experience abdominal discomfort such as a feeling of  fullness or gas pains.  FOLLOW-UP  Your doctor will discuss the results of your test with you.  SEEK IMMEDIATE MEDICAL ATTENTION IF ANY OF THE FOLLOWING OCCUR:  Excessive nausea (feeling sick to your stomach) and/or vomiting.   Severe abdominal pain and distention (swelling).   Trouble swallowing.   Temperature over 101 F (37.8 C).   Rectal bleeding or vomiting of blood.    Begin protonix 40 mg daily  GERD information provided  Diverticulosis and polyp information provided  As discussed today, the polyp in the right side of your colon was more extensive than that which could be seen at the time of your last colonoscopy. I do not believe it can be completely removed by colonoscopy. It was biopsied today. The safest approach is to see a general surgeon and have that part of your colon removed to take care of this problem.  Further recommendations to follow pending review of pathology report   Gastroesophageal Reflux Disease, Adult Gastroesophageal reflux disease (GERD) happens when acid from your stomach flows up into the esophagus. When acid comes in contact with the esophagus, the acid causes soreness (inflammation) in the esophagus. Over time, GERD may create small holes (ulcers) in the lining of the esophagus. CAUSES   Increased body weight. This puts pressure on the stomach, making acid rise from the stomach into the esophagus.  Smoking. This increases acid production in the stomach.  Drinking alcohol. This causes decreased pressure in the lower esophageal sphincter (valve or ring of muscle between the esophagus and stomach), allowing acid from the stomach into the esophagus.  Late evening meals and a full stomach. This increases pressure and acid production in the stomach.  A malformed lower esophageal sphincter. Sometimes, no cause is found. SYMPTOMS   Burning pain in the lower part of the mid-chest behind the breastbone and in the mid-stomach area. This may occur  twice a week or more often.  Trouble swallowing.  Sore throat.  Dry cough.  Asthma-like symptoms including chest tightness, shortness of breath, or wheezing. DIAGNOSIS  Your caregiver may be able to diagnose GERD based on your symptoms. In some cases, X-rays and other tests may be done to check for complications or to check the condition of your stomach and esophagus. TREATMENT  Your caregiver may recommend over-the-counter or prescription medicines to help decrease acid production. Ask your caregiver before starting or adding any new medicines.  HOME CARE INSTRUCTIONS   Change the factors that you can control. Ask your caregiver for guidance concerning weight loss, quitting smoking, and alcohol consumption.  Avoid foods and drinks that make your symptoms worse, such as:  Caffeine or alcoholic drinks.  Chocolate.  Peppermint or mint flavorings.  Garlic and onions.  Spicy foods.  Citrus fruits, such as oranges, lemons, or limes.  Tomato-based foods such as sauce, chili, salsa, and pizza.  Fried and fatty foods.  Avoid lying down for the 3 hours prior to your bedtime  or prior to taking a nap.  Eat small, frequent meals instead of large meals.  Wear loose-fitting clothing. Do not wear anything tight around your waist that causes pressure on your stomach.  Raise the head of your bed 6 to 8 inches with wood blocks to help you sleep. Extra pillows will not help.  Only take over-the-counter or prescription medicines for pain, discomfort, or fever as directed by your caregiver.  Do not take aspirin, ibuprofen, or other nonsteroidal anti-inflammatory drugs (NSAIDs). SEEK IMMEDIATE MEDICAL CARE IF:   You have pain in your arms, neck, jaw, teeth, or back.  Your pain increases or changes in intensity or duration.  You develop nausea, vomiting, or sweating (diaphoresis).  You develop shortness of breath, or you faint.  Your vomit is green, yellow, black, or looks like  coffee grounds or blood.  Your stool is red, bloody, or black. These symptoms could be signs of other problems, such as heart disease, gastric bleeding, or esophageal bleeding. MAKE SURE YOU:   Understand these instructions.  Will watch your condition.  Will get help right away if you are not doing well or get worse.   Diverticulosis Diverticulosis is a common condition that develops when small pouches (diverticula) form in the wall of the colon. The risk of diverticulosis increases with age. It happens more often in people who eat a low-fiber diet. Most individuals with diverticulosis have no symptoms. Those individuals with symptoms usually experience abdominal pain, constipation, or loose stools (diarrhea). HOME CARE INSTRUCTIONS   Increase the amount of fiber in your diet as directed by your caregiver or dietician. This may reduce symptoms of diverticulosis.  Your caregiver may recommend taking a dietary fiber supplement.  Drink at least 6 to 8 glasses of water each day to prevent constipation.  Try not to strain when you have a bowel movement.  Your caregiver may recommend avoiding nuts and seeds to prevent complications, although this is still an uncertain benefit.  Only take over-the-counter or prescription medicines for pain, discomfort, or fever as directed by your caregiver. FOODS WITH HIGH FIBER CONTENT INCLUDE:  Fruits. Apple, peach, pear, tangerine, raisins, prunes.  Vegetables. Brussels sprouts, asparagus, broccoli, cabbage, carrot, cauliflower, romaine lettuce, spinach, summer squash, tomato, winter squash, zucchini.  Starchy Vegetables. Baked beans, kidney beans, lima beans, split peas, lentils, potatoes (with skin).  Grains. Whole wheat bread, brown rice, bran flake cereal, plain oatmeal, white rice, shredded wheat, bran muffins. SEEK IMMEDIATE MEDICAL CARE IF:   You develop increasing pain or severe bloating.  You have an oral temperature above 102 F (38.9  C), not controlled by medicine.  You develop vomiting or bowel movements that are bloody or black.  Colon Polyps Polyps are lumps of extra tissue growing inside the body. Polyps can grow in the large intestine (colon). Most colon polyps are noncancerous (benign). However, some colon polyps can become cancerous over time. Polyps that are larger than a pea may be harmful. To be safe, caregivers remove and test all polyps. CAUSES  Polyps form when mutations in the genes cause your cells to grow and divide even though no more tissue is needed. RISK FACTORS There are a number of risk factors that can increase your chances of getting colon polyps. They include:  Being older than 50 years.  Family history of colon polyps or colon cancer.  Long-term colon diseases, such as colitis or Crohn disease.  Being overweight.  Smoking.  Being inactive.  Drinking too much alcohol. SYMPTOMS  Most small polyps do not cause symptoms. If symptoms are present, they may include:  Blood in the stool. The stool may look dark red or black.  Constipation or diarrhea that lasts longer than 1 week. DIAGNOSIS People often do not know they have polyps until their caregiver finds them during a regular checkup. Your caregiver can use 4 tests to check for polyps:  Digital rectal exam. The caregiver wears gloves and feels inside the rectum. This test would find polyps only in the rectum.  Barium enema. The caregiver puts a liquid called barium into your rectum before taking X-rays of your colon. Barium makes your colon look white. Polyps are dark, so they are easy to see in the X-ray pictures.  Sigmoidoscopy. A thin, flexible tube (sigmoidoscope) is placed into your rectum. The sigmoidoscope has a light and tiny camera in it. The caregiver uses the sigmoidoscope to look at the last third of your colon.  Colonoscopy. This test is like sigmoidoscopy, but the caregiver looks at the entire colon. This is the most  common method for finding and removing polyps. TREATMENT  Any polyps will be removed during a sigmoidoscopy or colonoscopy. The polyps are then tested for cancer. PREVENTION  To help lower your risk of getting more colon polyps:  Eat plenty of fruits and vegetables. Avoid eating fatty foods.  Do not smoke.  Avoid drinking alcohol.  Exercise every day.  Lose weight if recommended by your caregiver.  Eat plenty of calcium and folate. Foods that are rich in calcium include milk, cheese, and broccoli. Foods that are rich in folate include chickpeas, kidney beans, and spinach. HOME CARE INSTRUCTIONS Keep all follow-up appointments as directed by your caregiver. You may need periodic exams to check for polyps. SEEK MEDICAL CARE IF: You notice bleeding during a bowel movement.

## 2014-02-22 NOTE — Op Note (Signed)
[name], [address]   ENDOSCOPY PROCEDURE REPORT  PATIENT: Benjamin [name], Benjamin [name].  MR#: [id number] BIRTHDATE: Jul 26, 1951 , 62  yrs. old GENDER: Male ENDOSCOPIST: R.  Garfield Cornea, MD FACP FACG REFERRED BY:  Elsie Lincoln, M.D. PROCEDURE DATE:  02/22/2014 PROCEDURE:     EGD with [name] dilation followed by gastric biopsy  INDICATIONS:      esophageal dysphagia; GERD  INFORMED CONSENT:   The risks, benefits, limitations, alternatives and imponderables have been discussed.  The potential for biopsy, esophogeal dilation, etc. have also been reviewed.  Questions have been answered.  All parties agreeable.  Please see the history and physical in the medical record for more information.  MEDICATIONS:     Versed 5 mg IV and Demerol 100 mg IV in divided doses. Xylocaine gel orally per Zofran 4 mg IV  DESCRIPTION OF PROCEDURE:   The UX-[id number] ([id number])  endoscope was introduced through the mouth and advanced to the second portion of the duodenum without difficulty or limitations.  The mucosal surfaces were surveyed very carefully during advancement of the scope and upon withdrawal.  Retroflexion view of the proximal stomach and esophagogastric junction was performed.      FINDINGS: Couple areas of thin plaques distal esophagus easily rubbed off. Noncritical Schatzki's ring. No Barrett's esophagus. Circumferential distal esophageal erosions within 2-3 mm of the GE junction.  Stomach empty. Small hilar hernia. Antral erosions. No ulcer or infiltrating process. Patent pylorus. Examination of bulb and second portion revealed bulbar erosions only  THERAPEUTIC / DIAGNOSTIC MANEUVERS PERFORMED:  A 56 French Maloney dilator was passed to full insertion with ease. A look back revealed the ring had been ruptured without complication; there was minimal bleeding. Finally, biopsies of the abnormal gastric mucosa taken for histologic  study.   COMPLICATIONS:  None  IMPRESSION:     Schatzki's ring-status post dilation as described above. Mild erosive reflux esophagitis. Small hiatal hernia. Gastric and duodenal erosions-status post gastric biopsy  RECOMMENDATIONS:   Begin Protonix 40 mg daily.  Followup on pathology. See colonoscopy report.    _______________________________ R. Garfield Cornea, MD FACP [facility] eSigned:  R. Garfield Cornea, MD FACP [facility] 02/22/2014 10:21 AM     CC:

## 2014-02-22 NOTE — Op Note (Signed)
[facility] [address] [place], [address]   COLONOSCOPY PROCEDURE REPORT  PATIENT: Benjamin, [name].  MR#:         [id number] BIRTHDATE: February 16, 1951 , 62  yrs. old GENDER: Male ENDOSCOPIST: R.  Garfield Cornea, MD FACP FACG REFERRED BY:  Elsie Lincoln, M.D. PROCEDURE DATE:  02/22/2014 PROCEDURE:     Colonoscopy with saline-assisted snare polypectomy/biopsy/lesion tattooing  INDICATIONS: History multiple colonic adenomas removed recently. Ascending colon lesion debulked but not completely removed.  INFORMED CONSENT:  The risks, benefits, alternatives and imponderables including but not limited to bleeding, perforation as well as the possibility of a missed lesion have been reviewed.  The potential for biopsy, lesion removal, etc. have also been discussed.  Questions have been answered.  All parties agreeable. Please see the history and physical in the medical record for more information.  MEDICATIONS: Versed 9 mg IV and Demerol 100 mg IV in divided doses. Zofran 4 mg IV. Patient received atropine 0.25 mg IV secondary to observed bradycardia before the colonoscopy started.  DESCRIPTION OF PROCEDURE:  After a digital rectal exam was performed, the EC-[id number] ([id number])  colonoscope was advanced from the anus through the rectum and colon to the area of the cecum, ileocecal valve and appendiceal orifice.  The cecum was deeply intubated.  These structures were well-seen and photographed for the record.  From the level of the cecum and ileocecal valve, the scope was slowly and cautiously withdrawn.  The mucosal surfaces were carefully surveyed utilizing scope tip deflection to facilitate fold flattening as needed.  The scope was pulled down into the rectum where a thorough examination including retroflexion was performed.    FINDINGS:  Prep better than last time but remained marginal. Anal papilla; otherwise, normal rectum. Scattered pancolonic diverticula.  A  significant portion of the "carpet" polyp remains 7-8 cm distal to the ileocecal valve. It sprawls over a prominent fold.  It is probably at least 2 x 3 cm in dimensions.The remainder of the colonic mucosa appeared normal.  THERAPEUTIC / DIAGNOSTIC MANEUVERS PERFORMED:  I did inject about 6 cc of normal saline on the proximal side of the polyp to lift it away from the colonic wall. This did help swing it into the lumen somewhat. However, it was going to very difficult to engage it with a snare to completely remove it with snare cautery technique. I attempted to go proximal to the lesion and  retroflex, however, this lesion is too close to the ileocecal valve for that approach to be effective. I did piecemeal removed an additional portion of this lesion for the pathologist. The sample broke up with suctioning and was mixed with some stool residue. I elected to go ahead and do a cold biopsy of the persisting lesion as well. I concluded this lesion will not be effectively removed endoscopically. Finally, this lesion was tattooed generously with endo spot.   COMPLICATIONS: None  CECAL WITHDRAWAL TIME:  33 minutes  IMPRESSION:  Pancolonic diverticulosis. Sprawling carpet polyp in the ascending segment as described above-debulked. Lesion tattoo.  RECOMMENDATIONS: General surgery consultation for right hemicolectomy. Followup on pathology. See EGD report.   _______________________________ eSigned:  R. Garfield Cornea, MD FACP [facility] 02/22/2014 11:49 AM   CC:    PATIENT NAME:  Benjamin, [name]. MR#: [id number]

## 2014-02-22 NOTE — Interval H&P Note (Signed)
History and Physical Interval Note:  02/22/2014 9:53 AM  Benjamin [name]  has presented today for surgery, with the diagnosis of HISTORY OF COLON POLYPS  AND DYSPHAGIA  The various methods of treatment have been discussed with the patient and family. After consideration of risks, benefits and other options for treatment, the patient has consented to  Procedure(s) with comments: COLONOSCOPY (N/A) - 9:45 ESOPHAGOGASTRODUODENOSCOPY (EGD) (N/A) SAVORY DILATION (N/A) MALONEY DILATION (N/A) as a surgical intervention .  The patient's history has been reviewed, patient examined, no change in status, stable for surgery.  I have reviewed the patient's chart and labs.  Questions were answered to the patient's satisfaction.     Benjamin [name]  No change. EGD and colonoscopy per plan.The risks, benefits, limitations, imponderables and alternatives regarding both EGD and colonoscopy have been reviewed with the patient. Questions have been answered. All parties agreeable.

## 2014-02-22 NOTE — H&P (View-Only) (Signed)
Primary Care Physician:  Leonides Grills, MD Primary Gastroenterologist:  Dr. [name]  Pre-Procedure History & Physical: HPI:  Benjamin [name] is a 63 y.o. male here for set up followup colonoscopy but had multiple high-grade adenomas removed his colon in February. Poor prep. He's done well. He is being brought back [place] up the repeat colonoscopy in the setting of a better preparation. Also has esophageal dysphagia to solids. He desires EGD as discussed previously. He did not want workup previously but now is interested.  Past Medical History  Diagnosis Date  . Essential hypertension, benign   . Type 2 diabetes mellitus   . Mixed hyperlipidemia     Statin intolerance  . Gout   . Sleep apnea     questionable. uses oxygen at bedtime at times. no formal sleep study.  . Rotator cuff tear     chronic pain  . Tubular adenoma     Past Surgical History  Procedure Laterality Date  . Appendectomy    . Colonoscopy N/A 10/29/2013    Dr. [name]- sigmoid polyp status post cold snare removal. large sessile ascending colon polyp. debulked with saline assisted snare polypectomy . pancolonic diverticulosis. inadequate prep. tubular adenoma on bx    Prior to Admission medications   Medication Sig Start Date End Date Taking? Authorizing Provider  allopurinol (ZYLOPRIM) 100 MG tablet Take 100 mg by mouth daily.   Yes Historical Provider, MD  amLODipine (NORVASC) 10 MG tablet Take 1 tablet (10 mg total) by mouth daily. 09/22/13  Yes Lendon Colonel, NP  diazepam (VALIUM) 10 MG tablet Take 10 mg by mouth every 6 (six) hours as needed for anxiety.   Yes Historical Provider, MD  docusate sodium (COLACE) 100 MG capsule Take 1 capsule (100 mg total) by mouth at bedtime. 03/17/13  Yes Lendon Colonel, NP  glimepiride (AMARYL) 4 MG tablet Take 4 mg by mouth 2 (two) times daily.   Yes Historical Provider, MD  hydrochlorothiazide (HYDRODIURIL) 25 MG tablet Take 1 tablet (25 mg total) by mouth daily. 09/22/13   Yes Lendon Colonel, NP  HYDROcodone-acetaminophen (NORCO) 10-325 MG per tablet Take 1 tablet by mouth every 6 (six) hours as needed. 10/15/13  Yes Historical Provider, MD  losartan (COZAAR) 100 MG tablet TAKE ONE TABLET BY MOUTH ONCE DAILY 09/09/13  Yes Lendon Colonel, NP  metFORMIN (GLUCOPHAGE) 500 MG tablet Take 500 mg by mouth 2 (two) times daily with a meal.    Yes Historical Provider, MD  metoprolol (LOPRESSOR) 100 MG tablet Take 1 tablet (100 mg total) by mouth 2 (two) times daily. 09/22/13  Yes Lendon Colonel, NP  potassium chloride SA (K-DUR,KLOR-CON) 20 MEQ tablet Take 20 mEq by mouth daily. 01/20/13  Yes Lendon Colonel, NP  ranitidine (ZANTAC) 150 MG tablet Take 300 mg by mouth daily.    Yes Historical Provider, MD  polyethylene glycol-electrolytes (TRILYTE) 420 G solution Take 4,000 mLs by mouth as directed. 10/22/13   [name], MD    Allergies as of 02/02/2014 - Review Complete 02/02/2014  Allergen Reaction Noted  . Niacin-lovastatin er Other (See Comments) 10/18/2011  . Zocor [simvastatin - high dose] Other (See Comments) 10/18/2011    Family History  Problem Relation Age of Onset  . Diabetes type II Mother   . Colon cancer Neg Hx     History   Social History  . Marital Status: Married    Spouse Name: N/A    Number of Children:  2  . Years of Education: N/A   Occupational History  . Clinical biochemist   .     Social History Main Topics  . Smoking status: Former Smoker    Types: Cigarettes  . Smokeless tobacco: Never Used     Comment: quit 35 years ago  . Alcohol Use: No     Comment: None in over a year. prior to that 1-2 beers couple times per week.  . Drug Use: No  . Sexual Activity: Not on file   Other Topics Concern  . Not on file   Social History Narrative  . No narrative on file    Review of Systems: See HPI, otherwise negative ROS  Physical Exam: BP 148/82  Pulse 57  Temp(Src) 97.8 F (36.6 C) (Oral)  Ht 6\' 1"  (1.854 m)  Wt 258 lb  (117.028 kg)  BMI 34.05 kg/m2 General:   Alert,  Well-developed, well-nourished, pleasant and cooperative in NAD. Accompanied by his wife.  Skin:  Intact without significant lesions or rashes. Eyes:  Sclera clear, no icterus.   Conjunctiva pink. Ears:  Normal auditory acuity. Nose:  No deformity, discharge,  or lesions. Mouth:  No deformity or lesions. Neck:  Supple; no masses or thyromegaly. No significant cervical adenopathy. Lungs:  Clear throughout to auscultation.   No wheezes, crackles, or rhonchi. No acute distress. Heart:  Regular rate and rhythm; no murmurs, clicks, rubs,  or gallops. Abdomen: Non-distended, normal bowel sounds.  Soft and nontender without appreciable mass or hepatosplenomegaly.  Pulses:  Normal pulses noted. Extremities:  Without clubbing or edema.   Impression: 63 year old gentleman multiple high-grade adenomas removed as: 3 months ago. Poor prep. Needs to have a repeat colonoscopy to make sure all polyp tissue removed. Esophageal dysphagia desires further workup. Previously declined. No problems related to his prior colonoscopy, otherwise.   Recommendations:   Colonoscopy and EGD with esophageal dilation as appropriate/feasible in the near future.The risks, benefits, limitations, imponderables and alternatives regarding both EGD and colonoscopy have been reviewed with the patient. Questions have been answered. All parties agreeable.        Notice: This dictation was prepared with Dragon dictation along with smaller phrase technology. Any transcriptional errors that result from this process are unintentional and may not be corrected upon review.

## 2014-02-23 ENCOUNTER — Encounter: Payer: Self-pay | Admitting: Internal Medicine

## 2014-02-23 ENCOUNTER — Telehealth: Payer: Self-pay

## 2014-02-23 NOTE — Telephone Encounter (Signed)
Letter mailed to pt.  

## 2014-02-23 NOTE — Telephone Encounter (Signed)
Letter from: [name]  Send letter to patient.  Send copy of letter with path to referring provider and PCP.  Please get him an appointment ASAP at [facility] surgery for a right hemicolectomy

## 2014-02-24 ENCOUNTER — Other Ambulatory Visit: Payer: Self-pay | Admitting: Internal Medicine

## 2014-02-24 DIAGNOSIS — Z9049 Acquired absence of other specified parts of digestive tract: Secondary | ICD-10-CM

## 2014-02-24 NOTE — Telephone Encounter (Signed)
Results Cc to PCP and Referral has been made to [facility] in [place]

## 2014-02-25 ENCOUNTER — Encounter (HOSPITAL_COMMUNITY): Payer: Self-pay | Admitting: Internal Medicine

## 2014-03-01 ENCOUNTER — Observation Stay (HOSPITAL_COMMUNITY)
Admission: EM | Admit: 2014-03-01 | Discharge: 2014-03-02 | Disposition: A | Discharge status: Home / Self Care | Payer: BC Managed Care – PPO | Attending: Internal Medicine | Admitting: Internal Medicine

## 2014-03-01 ENCOUNTER — Encounter (HOSPITAL_COMMUNITY)
Admission: EM | Disposition: A | Discharge status: Home / Self Care | Payer: Self-pay | Source: Home / Self Care | Attending: Emergency Medicine

## 2014-03-01 ENCOUNTER — Encounter (HOSPITAL_COMMUNITY): Payer: Self-pay | Admitting: Emergency Medicine

## 2014-03-01 DIAGNOSIS — Z87891 Personal history of nicotine dependence: Secondary | ICD-10-CM | POA: Insufficient documentation

## 2014-03-01 DIAGNOSIS — K921 Melena: Secondary | ICD-10-CM | POA: Insufficient documentation

## 2014-03-01 DIAGNOSIS — E1165 Type 2 diabetes mellitus with hyperglycemia: Secondary | ICD-10-CM

## 2014-03-01 DIAGNOSIS — E782 Mixed hyperlipidemia: Secondary | ICD-10-CM | POA: Insufficient documentation

## 2014-03-01 DIAGNOSIS — R011 Cardiac murmur, unspecified: Secondary | ICD-10-CM

## 2014-03-01 DIAGNOSIS — I10 Essential (primary) hypertension: Secondary | ICD-10-CM | POA: Insufficient documentation

## 2014-03-01 DIAGNOSIS — Y849 Medical procedure, unspecified as the cause of abnormal reaction of the patient, or of later complication, without mention of misadventure at the time of the procedure: Secondary | ICD-10-CM | POA: Insufficient documentation

## 2014-03-01 DIAGNOSIS — E1159 Type 2 diabetes mellitus with other circulatory complications: Secondary | ICD-10-CM | POA: Diagnosis present

## 2014-03-01 DIAGNOSIS — M109 Gout, unspecified: Secondary | ICD-10-CM | POA: Insufficient documentation

## 2014-03-01 DIAGNOSIS — K5730 Diverticulosis of large intestine without perforation or abscess without bleeding: Secondary | ICD-10-CM | POA: Insufficient documentation

## 2014-03-01 DIAGNOSIS — E669 Obesity, unspecified: Secondary | ICD-10-CM

## 2014-03-01 DIAGNOSIS — IMO0002 Reserved for concepts with insufficient information to code with codable children: Principal | ICD-10-CM | POA: Insufficient documentation

## 2014-03-01 DIAGNOSIS — E785 Hyperlipidemia, unspecified: Secondary | ICD-10-CM

## 2014-03-01 DIAGNOSIS — D126 Benign neoplasm of colon, unspecified: Secondary | ICD-10-CM | POA: Insufficient documentation

## 2014-03-01 DIAGNOSIS — G4730 Sleep apnea, unspecified: Secondary | ICD-10-CM | POA: Insufficient documentation

## 2014-03-01 DIAGNOSIS — E119 Type 2 diabetes mellitus without complications: Secondary | ICD-10-CM

## 2014-03-01 DIAGNOSIS — K625 Hemorrhage of anus and rectum: Secondary | ICD-10-CM | POA: Diagnosis present

## 2014-03-01 DIAGNOSIS — IMO0001 Reserved for inherently not codable concepts without codable children: Secondary | ICD-10-CM | POA: Insufficient documentation

## 2014-03-01 HISTORY — PX: COLONOSCOPY: SHX5424

## 2014-03-01 LAB — CBC WITH DIFFERENTIAL/PLATELET
BASOS PCT: 0 % (ref 0–1)
Basophils Absolute: 0 10*3/uL (ref 0.0–0.1)
EOS PCT: 4 % (ref 0–5)
Eosinophils Absolute: 0.4 10*3/uL (ref 0.0–0.7)
HEMATOCRIT: 38.8 % — AB (ref 39.0–52.0)
Hemoglobin: 13.2 g/dL (ref 13.0–17.0)
Lymphocytes Relative: 29 % (ref 12–46)
Lymphs Abs: 3 10*3/uL (ref 0.7–4.0)
MCH: 29.2 pg (ref 26.0–34.0)
MCHC: 34 g/dL (ref 30.0–36.0)
MCV: 85.8 fL (ref 78.0–100.0)
MONO ABS: 0.7 10*3/uL (ref 0.1–1.0)
Monocytes Relative: 7 % (ref 3–12)
Neutro Abs: 6.1 10*3/uL (ref 1.7–7.7)
Neutrophils Relative %: 60 % (ref 43–77)
Platelets: 198 10*3/uL (ref 150–400)
RBC: 4.52 MIL/uL (ref 4.22–5.81)
RDW: 14.1 % (ref 11.5–15.5)
WBC: 10.2 10*3/uL (ref 4.0–10.5)

## 2014-03-01 LAB — HEMOGLOBIN AND HEMATOCRIT, BLOOD
HCT: 36.6 % — ABNORMAL LOW (ref 39.0–52.0)
HEMOGLOBIN: 12.3 g/dL — AB (ref 13.0–17.0)

## 2014-03-01 LAB — POC OCCULT BLOOD, ED: FECAL OCCULT BLD: POSITIVE — AB

## 2014-03-01 LAB — GLUCOSE, CAPILLARY
GLUCOSE-CAPILLARY: 109 mg/dL — AB (ref 70–99)
Glucose-Capillary: 139 mg/dL — ABNORMAL HIGH (ref 70–99)

## 2014-03-01 LAB — BASIC METABOLIC PANEL
BUN: 24 mg/dL — ABNORMAL HIGH (ref 6–23)
CALCIUM: 9.1 mg/dL (ref 8.4–10.5)
CO2: 28 mEq/L (ref 19–32)
CREATININE: 1.3 mg/dL (ref 0.50–1.35)
Chloride: 99 mEq/L (ref 96–112)
GFR calc non Af Amer: 56 mL/min — ABNORMAL LOW (ref >90)
GFR, EST AFRICAN AMERICAN: 65 mL/min — AB (ref >90)
Glucose, Bld: 186 mg/dL — ABNORMAL HIGH (ref 70–99)
Potassium: 3.8 mEq/L (ref 3.7–5.3)
Sodium: 142 mEq/L (ref 137–147)

## 2014-03-01 SURGERY — COLONOSCOPY
Anesthesia: Moderate Sedation

## 2014-03-01 MED ORDER — MIDAZOLAM HCL 5 MG/5ML IJ SOLN
INTRAMUSCULAR | Status: DC | PRN
  Administered 2014-03-01 (×3): 2 mg via INTRAVENOUS

## 2014-03-01 MED ORDER — MIDAZOLAM HCL 5 MG/5ML IJ SOLN
INTRAMUSCULAR | Status: AC
  Filled 2014-03-01: qty 10

## 2014-03-01 MED ORDER — ATROPINE SULFATE 1 MG/ML IJ SOLN
INTRAMUSCULAR | Status: DC | PRN
  Administered 2014-03-01: .25 mg via INTRAVENOUS

## 2014-03-01 MED ORDER — ALLOPURINOL 100 MG PO TABS
100.0000 mg | ORAL_TABLET | Freq: Every day | ORAL | Status: DC
  Administered 2014-03-02: 100 mg via ORAL
  Filled 2014-03-01: qty 1

## 2014-03-01 MED ORDER — POTASSIUM CHLORIDE CRYS ER 20 MEQ PO TBCR
20.0000 meq | EXTENDED_RELEASE_TABLET | Freq: Every day | ORAL | Status: DC
  Administered 2014-03-02: 20 meq via ORAL
  Filled 2014-03-01: qty 1

## 2014-03-01 MED ORDER — STERILE WATER FOR IRRIGATION IR SOLN
Status: DC | PRN
  Administered 2014-03-01: 18:00:00

## 2014-03-01 MED ORDER — ACETAMINOPHEN 325 MG PO TABS: 650.0000 mg | ORAL_TABLET | Freq: Four times a day (QID) | ORAL | Status: DC | PRN

## 2014-03-01 MED ORDER — PROMETHAZINE HCL 25 MG/ML IJ SOLN
INTRAMUSCULAR | Status: DC | PRN
  Administered 2014-03-01: 12.5 mg via INTRAVENOUS

## 2014-03-01 MED ORDER — PROMETHAZINE HCL 25 MG/ML IJ SOLN
INTRAMUSCULAR | Status: AC
Start: 2014-03-01 — End: 2014-03-02
  Filled 2014-03-01: qty 1

## 2014-03-01 MED ORDER — SODIUM CHLORIDE 0.9 % IV SOLN: INTRAVENOUS | Status: DC

## 2014-03-01 MED ORDER — EPINEPHRINE HCL 0.1 MG/ML IJ SOSY
PREFILLED_SYRINGE | INTRAMUSCULAR | Status: AC
  Filled 2014-03-01: qty 10

## 2014-03-01 MED ORDER — SODIUM CHLORIDE 0.9 % IJ SOLN
INTRAMUSCULAR | Status: AC
  Filled 2014-03-01: qty 10

## 2014-03-01 MED ORDER — ALUM & MAG HYDROXIDE-SIMETH 200-200-20 MG/5ML PO SUSP
30.0000 mL | Freq: Four times a day (QID) | ORAL | Status: DC | PRN
Start: 2014-03-01 — End: 2014-03-02

## 2014-03-01 MED ORDER — DOCUSATE SODIUM 100 MG PO CAPS
100.0000 mg | ORAL_CAPSULE | Freq: Every day | ORAL | Status: DC
  Administered 2014-03-01: 100 mg via ORAL
  Filled 2014-03-01: qty 1

## 2014-03-01 MED ORDER — ATROPINE SULFATE 1 MG/ML IJ SOLN
INTRAMUSCULAR | Status: AC
  Filled 2014-03-01: qty 1

## 2014-03-01 MED ORDER — MEPERIDINE HCL 100 MG/ML IJ SOLN
INTRAMUSCULAR | Status: DC | PRN
  Administered 2014-03-01 (×3): 50 mg via INTRAVENOUS

## 2014-03-01 MED ORDER — ONDANSETRON HCL 4 MG/2ML IJ SOLN: 4.0000 mg | Freq: Four times a day (QID) | INTRAMUSCULAR | Status: DC | PRN

## 2014-03-01 MED ORDER — PANTOPRAZOLE SODIUM 40 MG PO TBEC
40.0000 mg | DELAYED_RELEASE_TABLET | Freq: Every day | ORAL | Status: DC
  Administered 2014-03-02: 40 mg via ORAL
  Filled 2014-03-01: qty 1

## 2014-03-01 MED ORDER — INSULIN ASPART 100 UNIT/ML ~~LOC~~ SOLN
0.0000 [IU] | Freq: Three times a day (TID) | SUBCUTANEOUS | Status: DC
  Administered 2014-03-02 (×2): 2 [IU] via SUBCUTANEOUS

## 2014-03-01 MED ORDER — ONDANSETRON HCL 4 MG PO TABS: 4.0000 mg | ORAL_TABLET | Freq: Four times a day (QID) | ORAL | Status: DC | PRN

## 2014-03-01 MED ORDER — AMLODIPINE BESYLATE 5 MG PO TABS
10.0000 mg | ORAL_TABLET | Freq: Every day | ORAL | Status: DC
  Administered 2014-03-02: 10 mg via ORAL
  Filled 2014-03-01: qty 2

## 2014-03-01 MED ORDER — SODIUM CHLORIDE 0.9 % IV SOLN
Freq: Once | INTRAVENOUS | Status: AC
  Administered 2014-03-01: 11:00:00 via INTRAVENOUS

## 2014-03-01 MED ORDER — ACETAMINOPHEN 650 MG RE SUPP: 650.0000 mg | Freq: Four times a day (QID) | RECTAL | Status: DC | PRN

## 2014-03-01 MED ORDER — HYDROCHLOROTHIAZIDE 25 MG PO TABS
25.0000 mg | ORAL_TABLET | Freq: Every day | ORAL | Status: DC
  Administered 2014-03-02: 25 mg via ORAL
  Filled 2014-03-01: qty 1

## 2014-03-01 MED ORDER — ONDANSETRON HCL 4 MG/2ML IJ SOLN
INTRAMUSCULAR | Status: DC
Start: 2014-03-01 — End: 2014-03-01
  Filled 2014-03-01: qty 2

## 2014-03-01 MED ORDER — LOSARTAN POTASSIUM 50 MG PO TABS
100.0000 mg | ORAL_TABLET | Freq: Every day | ORAL | Status: DC
  Administered 2014-03-02: 100 mg via ORAL
  Filled 2014-03-01: qty 2

## 2014-03-01 MED ORDER — SODIUM CHLORIDE 0.9 % IJ SOLN
PREFILLED_SYRINGE | INTRAMUSCULAR | Status: DC | PRN
  Administered 2014-03-01: 19:00:00

## 2014-03-01 MED ORDER — MEPERIDINE HCL 100 MG/ML IJ SOLN
INTRAMUSCULAR | Status: AC
  Filled 2014-03-01: qty 2

## 2014-03-01 MED ORDER — HYDROCODONE-ACETAMINOPHEN 10-325 MG PO TABS: 1.0000 | ORAL_TABLET | Freq: Four times a day (QID) | ORAL | Status: DC | PRN

## 2014-03-01 MED ORDER — METOPROLOL TARTRATE 50 MG PO TABS
100.0000 mg | ORAL_TABLET | Freq: Two times a day (BID) | ORAL | Status: DC
  Administered 2014-03-01 – 2014-03-02 (×2): 100 mg via ORAL
  Filled 2014-03-01 (×2): qty 2

## 2014-03-01 NOTE — ED Notes (Addendum)
Routine colonoscopy x 1 wk ago. Rectal bleeding with dark red blood and clots starting last night.  Denies n/v, denies abd pain.

## 2014-03-01 NOTE — Op Note (Addendum)
[facility] [address] [place], [address]   COLONOSCOPY PROCEDURE REPORT  PATIENT: Benjamin [name], Benjamin [name].  MR#:         [id number] BIRTHDATE: 04/19/1951 , 62  yrs. old GENDER: Male ENDOSCOPIST: R.  Garfield Cornea, MD FACP FACG REFERRED BY:  Fanny Skates, M.D.  Elsie Lincoln, M.D. PROCEDURE DATE:  03/01/2014 PROCEDURE:     Colonoscopy with bleeding control therapy INDICATIONS: hematochezia; colonoscopy with polypectomy 7 days ago  INFORMED CONSENT:  The risks, benefits, alternatives and imponderables including but not limited to bleeding, perforation as well as the possibility of a missed lesion have been reviewed.  The potential for biopsy, lesion removal, etc. have also been discussed.  Questions have been answered.  All parties agreeable. Please see the history and physical in the medical record for more information.  MEDICATIONS: Versed 6 mg IV and Demerol 150 mg IV in divided doses. Phenergan 12.5 mg IV please note he received atropine 0.25 mg IV prior to the initiation of the procedure given history of bradycardia 4)  colonoscope was advanced from the anus through the rectum and colon to the area of the cecum, ileocecal valve and appendiceal orifice.  The cecum was deeply intubated.   FINDINGS:  Fresh blood and clot in the lumen of the rectum trailing to the cecum;  site of prior ascending colon polypectomy was identified. The polypectomy site ulcer crater had a bleeding visible vessel.  THERAPEUTIC / DIAGNOSTIC MANEUVERS PERFORMED:  Bleeding site was injected peripherally with 2 cc of 1-10,000 epinephrine. This slowed bleeding significantly. Subsequently I deployed a total of 6 hemostasis clips  (3) indirectly and 3 directly on the bleeder with good hemostasis being achieved.  No other bleeding lesions found.  COMPLICATIONS: none  CECAL WITHDRAWAL TIME:  35 minutes   IMPRESSION:   Post polypectomy hemorrhage-status post bleeding control  therapy  RECOMMENDATIONS: Clear liquid diet. Check an H&H tomorrow morning. Keep plans to see Dr. Fanny Skates later in the week to discuss right hemicolectomy.  No Naprosyn, aspirin or other NSAIDs/antiplatelet agent for 2 weeks.   _______________________________ eSigned:  R. Garfield Cornea, MD FACP [facility] 03/01/2014 7:01 PM Revised: 03/01/2014 7:01 PM  CC:

## 2014-03-01 NOTE — H&P (Signed)
[facility] History and Physical  [name] QZE:[id number] DOB: [birth year] DOA: 03/01/2014  Referring physician: Lynelle Smoke L. Vanessa Lynn, PA-C PCP: Leonides Grills, MD   Chief Complaint: Rectal bleeding  HPI: Benjamin [name] is a 63 y.o. male history of HTN and DM 2, came into the hospital because of rectal bleeding. Patient had recent colonoscopy done on 02/22/14 by Dr. [name] with partial removal of carpet polyp, please note that colonoscopy also showed pandiverticulosis. Since then the patient was in his usual state of health until last night when he started to have bright red blood per rectum, t had 2 bloody bowel movements since then, came in this morning to the ED for further evaluation. He denies nausea, vomiting, abdominal pain or any other symptoms. In the ED his vitals are stable, hemoglobin is 13.2, seen by GI and recommended to keep her overnight for further observation.  Review of Systems:  Constitutional: negative for anorexia, fevers and sweats Eyes: negative for irritation, redness and visual disturbance Ears, nose, mouth, throat, and face: negative for earaches, epistaxis, nasal congestion and sore throat Respiratory: negative for cough, dyspnea on exertion, sputum and wheezing Cardiovascular: negative for chest pain, dyspnea, lower extremity edema, orthopnea, palpitations and syncope Gastrointestinal: Per HPI Genitourinary:negative for dysuria, frequency and hematuria Hematologic/lymphatic: negative for bleeding, easy bruising and lymphadenopathy Musculoskeletal:negative for arthralgias, muscle weakness and stiff joints Neurological: negative for coordination problems, gait problems, headaches and weakness Endocrine: negative for diabetic symptoms including polydipsia, polyuria and weight loss Allergic/Immunologic: negative for anaphylaxis, hay fever and urticaria  Past Medical History  Diagnosis Date  . Essential hypertension, benign   . Type 2 diabetes mellitus     . Mixed hyperlipidemia     Statin intolerance  . Gout   . Sleep apnea     questionable. uses oxygen at bedtime at times. no formal sleep study.  . Rotator cuff tear     chronic pain  . Tubular adenoma    Past Surgical History  Procedure Laterality Date  . Appendectomy    . Colonoscopy N/A 10/29/2013    Dr. [name]- sigmoid polyp status post cold snare removal. large sessile ascending colon polyp. debulked with saline assisted snare polypectomy . pancolonic diverticulosis. inadequate prep. tubular adenoma on bx  . Colonoscopy N/A 02/22/2014    Dr. [name]: Saline-assisted snare polypectomy/biopsy for sprawling carpet polyp in the ascending colon which could not be completely removed. Status post biopsy (tubular adenoma), tattooed  . Esophagogastroduodenoscopy N/A 02/22/2014    Dr. [name]: Erosive reflux esophagitis. Schatzki ring status post dilation. Gastric and duodenal erosions with benign biopsies.  Azzie Almas dilation N/A 02/22/2014    Procedure: Azzie Almas DILATION;  Surgeon: [name], MD;  Location: [facility];  Service: Endoscopy;  Laterality: N/A;  [name] dilation N/A 02/22/2014    Procedure: [name] DILATION;  Surgeon: [name], MD;  Location: [facility];  Service: Endoscopy;  Laterality: N/A;   Social History:   reports that he has quit smoking. His smoking use included Cigarettes. He smoked 0.00 packs per day. He has never used smokeless tobacco. He reports that he does not drink alcohol or use illicit drugs.  Allergies  Allergen Reactions  . Niacin-Lovastatin Er Other (See Comments)    Body aches  . Zocor [Simvastatin - High Dose] Other (See Comments)    Body aches.    Family History  Problem Relation Age of Onset  . Diabetes type II Mother   . Colon cancer Neg Hx  Prior to Admission medications   Medication Sig Start Date End Date Taking? Authorizing Provider  allopurinol (ZYLOPRIM) 100 MG tablet Take 100 mg by mouth daily.   Yes Historical Provider, MD   amLODipine (NORVASC) 10 MG tablet Take 1 tablet (10 mg total) by mouth daily. 09/22/13  Yes Lendon Colonel, NP  CINNAMON PO Take 1 tablet by mouth daily.   Yes Historical Provider, MD  diazepam (VALIUM) 10 MG tablet Take 10 mg by mouth every 6 (six) hours as needed for anxiety.   Yes Historical Provider, MD  docusate sodium (COLACE) 100 MG capsule Take 1 capsule (100 mg total) by mouth at bedtime. 03/17/13  Yes Lendon Colonel, NP  glimepiride (AMARYL) 4 MG tablet Take 4 mg by mouth 2 (two) times daily.   Yes Historical Provider, MD  hydrochlorothiazide (HYDRODIURIL) 25 MG tablet Take 1 tablet (25 mg total) by mouth daily. 09/22/13  Yes Lendon Colonel, NP  HYDROcodone-acetaminophen (NORCO) 10-325 MG per tablet Take 1 tablet by mouth every 6 (six) hours as needed for moderate pain.  10/15/13  Yes Historical Provider, MD  losartan (COZAAR) 100 MG tablet TAKE ONE TABLET BY MOUTH ONCE DAILY 09/09/13  Yes Lendon Colonel, NP  metFORMIN (GLUCOPHAGE) 500 MG tablet Take 500 mg by mouth 2 (two) times daily with a meal.    Yes Historical Provider, MD  metoprolol (LOPRESSOR) 100 MG tablet Take 1 tablet (100 mg total) by mouth 2 (two) times daily. 09/22/13  Yes Lendon Colonel, NP  naproxen (NAPROSYN) 500 MG tablet Take 1 tablet by mouth 2 (two) times daily. 01/29/14  Yes Historical Provider, MD  NIACIN PO Take 1 tablet by mouth daily.   Yes Historical Provider, MD  Omega-3 Fatty Acids (FISH OIL PO) Take 1 capsule by mouth daily.   Yes Historical Provider, MD  pantoprazole (PROTONIX) 40 MG tablet Take 1 tablet by mouth daily. 02/22/14  Yes Historical Provider, MD  potassium chloride SA (K-DUR,KLOR-CON) 20 MEQ tablet Take 20 mEq by mouth daily. 01/20/13  Yes Lendon Colonel, NP  Red Yeast Rice Extract (RED YEAST RICE PO) Take 1 tablet by mouth daily.   Yes Historical Provider, MD   Physical Exam: Filed Vitals:   03/01/14 1256  BP: 138/59  Pulse: 52  Temp:   Resp: 16   Constitutional: Oriented to  person, place, and time. Well-developed and well-nourished. Cooperative.  Head: Normocephalic and atraumatic.  Nose: Nose normal.  Mouth/Throat: Uvula is midline, oropharynx is clear and moist and mucous membranes are normal.  Eyes: Conjunctivae and EOM are normal. Pupils are equal, round, and reactive to light.  Neck: Trachea normal and normal range of motion. Neck supple.  Cardiovascular: Normal rate, regular rhythm, S1 normal, S2 normal, normal heart sounds and intact distal pulses.   Pulmonary/Chest: Effort normal and breath sounds normal.  Abdominal: Soft. Bowel sounds are normal. There is no hepatosplenomegaly. There is no tenderness.  Musculoskeletal: Normal range of motion.  Neurological: Alert and oriented to person, place, and time. He has normal strength. No cranial nerve deficit or sensory deficit.  Skin: Skin is warm, dry and intact.  Psychiatric: Has a normal mood and affect. Speech is normal and behavior is normal.   Labs on Admission:  Basic Metabolic Panel:  Recent Labs Lab 03/01/14 1027  NA 142  K 3.8  CL 99  CO2 28  GLUCOSE 186*  BUN 24*  CREATININE 1.30  CALCIUM 9.1   Liver Function Tests: No results found  for this basename: AST, ALT, ALKPHOS, BILITOT, PROT, ALBUMIN,  in the last 168 hours No results found for this basename: LIPASE, AMYLASE,  in the last 168 hours No results found for this basename: AMMONIA,  in the last 168 hours CBC:  Recent Labs Lab 03/01/14 1027  WBC 10.2  NEUTROABS 6.1  HGB 13.2  HCT 38.8*  MCV 85.8  PLT 198   Cardiac Enzymes: No results found for this basename: CKTOTAL, CKMB, CKMBINDEX, TROPONINI,  in the last 168 hours  BNP (last 3 results) No results found for this basename: PROBNP,  in the last 8760 hours CBG: No results found for this basename: GLUCAP,  in the last 168 hours  Radiological Exams on Admission: No results found.  EKG: Independently reviewed.   Assessment/Plan Principal Problem:   Rectal  bleeding Active Problems:   Hypertension   Hyperlipidemia   Type II or unspecified type diabetes mellitus without mention of complication, uncontrolled   Diverticulosis of colon    Rectal bleeding -Presented with 2 bloody bowel movements, had recent colonoscopy with partial removal of carpet polyp. -Bleeding could be secondary to recent instrumentation versus diverticular bleed. -Keep patient on clear liquids, observe for bleeding, check hemoglobin a.m. -If bleeding does not stop till the morning patient will undergo colonoscopy.  Colonic polyp -Pathology showed tubular adenoma no high grade dysplasia or carcinoma.  DM 2 -Patient on clear liquids, hold oral hypoglycemic agents. -Start on Cenestin sliding scale, check A1c.  Hypertension -Home medications restarted.  Code Status: Full code Family Communication: Plan discussed with the patient in the presence of his wife at bedside.  Disposition Plan: None  Time spent: 70 minutes  [facility] Pager [phone]

## 2014-03-01 NOTE — Telephone Encounter (Signed)
[name] called stating pt has a TCS done last Monday with Dr. [name]. Pt has been doing fine and per wife pt went to the bathroom last night and noticed some spotting of blood but not much. Pt had a BM this morning and it was a lot of blood, pt read on his D/C papers that if he seen a lot of blood to please call this office. Pt wants to know what does he need to do. Please advise [phone]

## 2014-03-01 NOTE — Telephone Encounter (Signed)
Patients wife called back and stated that Benjamin [name] has went back to the bathroom again to have a BM an its nothing but pure blood, so I advised her to take him to ER

## 2014-03-01 NOTE — Telephone Encounter (Signed)
[name] called stating pt has a TCS done last Monday with Dr. [name]. Pt has been doing fine and per wife pt went to the bathroom last night and noticed some spotting of blood but not much. Pt had a BM this morning and it was a lot of blood, pt read on his D/C papers that if he seen a lot of blood to please call this office. Pt wants to know what does he need to do. Please advise [phone]

## 2014-03-01 NOTE — ED Provider Notes (Signed)
CSN: [id number]     Arrival date & time 03/01/14  6213 History   First MD Initiated Contact with Patient 03/01/14 1024     Chief Complaint  Patient presents with  . Rectal Bleeding     (Consider location/radiation/quality/duration/timing/severity/associated sxs/prior Treatment) Patient is a 63 y.o. male presenting with hematochezia. The history is provided by the patient.  Rectal Bleeding Quality:  Black and tarry Amount:  Moderate Duration:  9 hours Timing:  Constant Progression:  Unchanged Chronicity:  New Context comment:  Pt has large colon polyp that was partially removed last Monday Relieved by:  Nothing Worsened by:  Defecation Ineffective treatments:  None tried Associated symptoms: no abdominal pain, no dizziness, no fever, no hematemesis, no light-headedness, no loss of consciousness, no recent illness and no vomiting   Risk factors: no anticoagulant use     Patient had a colonscopy one week ago by Dr. [name], and has a large polyp that patient states will have to been surgically removed, reports single episode of rectal bleeding this morning at 2:00 am while trying to defecate states he passed "dark redness black blood" that was seen on the tissue and in the toilet.  He states he also had a similar episode around 8:00 am as well.  He denies dizziness, abd pain, vomiting, hematemesis, fever, weakness or syncope.  Nothing makes the symptoms better or worse.  He states that Dr. Roseanne Kaufman office has scheduled him to see a Psychologist, sport and exercise at [facility] on 03/04/14.      Past Medical History  Diagnosis Date  . Essential hypertension, benign   . Type 2 diabetes mellitus   . Mixed hyperlipidemia     Statin intolerance  . Gout   . Sleep apnea     questionable. uses oxygen at bedtime at times. no formal sleep study.  . Rotator cuff tear     chronic pain  . Tubular adenoma    Past Surgical History  Procedure Laterality Date  . Appendectomy    . Colonoscopy N/A 10/29/2013   Dr. [name]- sigmoid polyp status post cold snare removal. large sessile ascending colon polyp. debulked with saline assisted snare polypectomy . pancolonic diverticulosis. inadequate prep. tubular adenoma on bx  . Colonoscopy N/A 02/22/2014    Dr. [name]: Saline-assisted snare polypectomy/biopsy for sprawling carpet polyp in the ascending colon which could not be completely removed. Status post biopsy (tubular adenoma), tattooed  . Esophagogastroduodenoscopy N/A 02/22/2014    Dr. [name]: Erosive reflux esophagitis. Schatzki ring status post dilation. Gastric and duodenal erosions with benign biopsies.  Azzie Almas dilation N/A 02/22/2014    Procedure: Azzie Almas DILATION;  Surgeon: [name], MD;  [facility];  Service: Endoscopy;  Laterality: N/A;  [name] dilation N/A 02/22/2014    Procedure: [name] DILATION;  Surgeon: [name], MD;  [facility]: [facility];  Service: Endoscopy;  Laterality: N/A;   Family History  Problem Relation Age of Onset  . Diabetes type II Mother   . Colon cancer Neg Hx    History  Substance Use Topics  . Smoking status: Former Smoker    Types: Cigarettes  . Smokeless tobacco: Never Used     Comment: quit 35 years ago  . Alcohol Use: No     Comment: None in over a year. prior to that 1-2 beers couple times per week.    Review of Systems  Constitutional: Negative for fever, chills and fatigue.  Respiratory: Negative for cough, shortness of breath and wheezing.  Cardiovascular: Negative for chest pain and palpitations.  Gastrointestinal: Positive for blood in stool and hematochezia. Negative for nausea, vomiting, abdominal pain, diarrhea, abdominal distention, rectal pain and hematemesis.  Genitourinary: Negative for dysuria, hematuria, flank pain and difficulty urinating.  Musculoskeletal: Negative for arthralgias, back pain and myalgias.  Skin: Negative for rash.  Neurological: Negative for dizziness, loss of consciousness, syncope, weakness,  light-headedness and numbness.  Hematological: Does not bruise/bleed easily.  All other systems reviewed and are negative.     Allergies  Niacin-lovastatin er and Zocor  Home Medications   Prior to Admission medications   Medication Sig Start Date End Date Taking? Authorizing Provider  allopurinol (ZYLOPRIM) 100 MG tablet Take 100 mg by mouth daily.   Yes Historical Provider, MD  amLODipine (NORVASC) 10 MG tablet Take 1 tablet (10 mg total) by mouth daily. 09/22/13  Yes Lendon Colonel, NP  CINNAMON PO Take 1 tablet by mouth daily.   Yes Historical Provider, MD  diazepam (VALIUM) 10 MG tablet Take 10 mg by mouth every 6 (six) hours as needed for anxiety.   Yes Historical Provider, MD  docusate sodium (COLACE) 100 MG capsule Take 1 capsule (100 mg total) by mouth at bedtime. 03/17/13  Yes Lendon Colonel, NP  glimepiride (AMARYL) 4 MG tablet Take 4 mg by mouth 2 (two) times daily.   Yes Historical Provider, MD  hydrochlorothiazide (HYDRODIURIL) 25 MG tablet Take 1 tablet (25 mg total) by mouth daily. 09/22/13  Yes Lendon Colonel, NP  HYDROcodone-acetaminophen (NORCO) 10-325 MG per tablet Take 1 tablet by mouth every 6 (six) hours as needed for moderate pain.  10/15/13  Yes Historical Provider, MD  losartan (COZAAR) 100 MG tablet TAKE ONE TABLET BY MOUTH ONCE DAILY 09/09/13  Yes Lendon Colonel, NP  metFORMIN (GLUCOPHAGE) 500 MG tablet Take 500 mg by mouth 2 (two) times daily with a meal.    Yes Historical Provider, MD  metoprolol (LOPRESSOR) 100 MG tablet Take 1 tablet (100 mg total) by mouth 2 (two) times daily. 09/22/13  Yes Lendon Colonel, NP  naproxen (NAPROSYN) 500 MG tablet Take 1 tablet by mouth 2 (two) times daily. 01/29/14  Yes Historical Provider, MD  NIACIN PO Take 1 tablet by mouth daily.   Yes Historical Provider, MD  Omega-3 Fatty Acids (FISH OIL PO) Take 1 capsule by mouth daily.   Yes Historical Provider, MD  pantoprazole (PROTONIX) 40 MG tablet Take 1 tablet by  mouth daily. 02/22/14  Yes Historical Provider, MD  potassium chloride SA (K-DUR,KLOR-CON) 20 MEQ tablet Take 20 mEq by mouth daily. 01/20/13  Yes Lendon Colonel, NP  Red Yeast Rice Extract (RED YEAST RICE PO) Take 1 tablet by mouth daily.   Yes Historical Provider, MD   BP 161/95  Pulse 53  Temp(Src) 98.1 F (36.7 C) (Oral)  Resp 20  SpO2 98% Physical Exam  Nursing note and vitals reviewed. Constitutional: He is oriented to person, place, and time. He appears well-developed and well-nourished. No distress.  HENT:  Head: Normocephalic and atraumatic.  Mouth/Throat: Oropharynx is clear and moist.  Neck: Normal range of motion. Neck supple.  Cardiovascular: Normal rate, regular rhythm, normal heart sounds and intact distal pulses.   No murmur heard. Pulmonary/Chest: Effort normal and breath sounds normal. No respiratory distress. He exhibits no tenderness.  Abdominal: Soft. Bowel sounds are normal. He exhibits no distension and no mass. There is no tenderness. There is no rebound and no guarding.  Genitourinary: Rectal exam  shows no external hemorrhoid, no mass, no tenderness and anal tone normal. Guaiac positive stool.  Heme positive, maroon colored stool.    Musculoskeletal: Normal range of motion.  Neurological: He is alert and oriented to person, place, and time. He exhibits normal muscle tone. Coordination normal.  Skin: Skin is warm and dry.    ED Course  Procedures (including critical care time) Labs Review Labs Reviewed  CBC WITH DIFFERENTIAL - Abnormal; Notable for the following:    HCT 38.8 (*)    All other components within normal limits  BASIC METABOLIC PANEL - Abnormal; Notable for the following:    Glucose, Bld 186 (*)    BUN 24 (*)    GFR calc non Af Amer 56 (*)    GFR calc Af Amer 65 (*)    All other components within normal limits  HEMOGLOBIN AND HEMATOCRIT, BLOOD - Abnormal; Notable for the following:    Hemoglobin 12.3 (*)    HCT 36.6 (*)    All other  components within normal limits  GLUCOSE, CAPILLARY - Abnormal; Notable for the following:    Glucose-Capillary 139 (*)    All other components within normal limits  POC OCCULT BLOOD, ED - Abnormal; Notable for the following:    Fecal Occult Bld POSITIVE (*)    All other components within normal limits  HEMOGLOBIN A1C  POC OCCULT BLOOD, ED    Imaging Review No results found.   EKG Interpretation None      MDM   Final diagnoses:  Rectal bleeding    Pt is hemodynamically stable.  Well appearing  On review of note from Dr.  [name], pt had colonoscopy on 02/22/14 , has large ascending colon polyp which was de bulked but not completely removed.  Pt referred to [facility] to have a right hemicolectomy.    Neil Crouch, PA with Dr. [name], came to ED to see patient.  Recommended pt be admitted to hospitalist service to see if bleeding resolves, if not states they will scope pt tomorrow.    [place] Dr. Hartford Poli , will admit pt to med surg.  Pt agrees to plan.    Raha Tennison L. Vanessa Gallatin, PA-C 03/01/14 1658

## 2014-03-01 NOTE — Progress Notes (Signed)
Notified Dr. [name] of the patients large black/bloody BM.  I also notified him of the patients recent H/H.  The patient appears to be stable at this time.  MD stated that he would be in to see him soon.   Late entry when the patient first arrived to the floor the patient stated that he did have a small bm that was slightly bloody, but nothing of what he had in the morning according to the patient.  He no complaints voiced at this time.

## 2014-03-01 NOTE — Telephone Encounter (Signed)
Aware. Patient is in the ER.

## 2014-03-01 NOTE — ED Notes (Signed)
Pt eval by Margit Hanks, PA

## 2014-03-01 NOTE — Telephone Encounter (Signed)
Rouging to Dr. [name] and Neil Crouch, PA.

## 2014-03-01 NOTE — Consult Note (Signed)
Referring Provider: Maudry Diego, MD Primary Care Physician:  Leonides Grills, MD Primary Gastroenterologist:  Garfield Cornea, MD  Reason for Consultation:  Rectal bleeding  HPI: Benjamin [name] is a 63 y.o. male s/p colonoscopy with saline assisted snare polypectomy/biopsy on 02/22/14 who presents with less than 24 hour history of rectal bleeding. Patient had significant polyp which could not be completely removed endoscopically. Lesion was tattooed and he is being referred for right hemicolectomy at [facility] in the near future. Pathology revealed tubular adenoma.   Patient woke up at 2am with rectal bleeding. States it is pure blood with some clots. No abdominal pain or lightheadedness. No N/V. Has not been on any ASA or other blood thinners. Has an appointment with Dr. Dalbert Batman this Thursday for consultation.   Chronically on Naproxyn for rotator cuff pain. Started PPI after EGD.  Lab Results  Component Value Date   WBC 10.2 03/01/2014   HGB 13.2 03/01/2014   HCT 38.8* 03/01/2014   MCV 85.8 03/01/2014   PLT 198 03/01/2014     No current facility-administered medications for this encounter.   Current Outpatient Prescriptions  Medication Sig Dispense Refill  . allopurinol (ZYLOPRIM) 100 MG tablet Take 100 mg by mouth daily.      [name] amLODipine (NORVASC) 10 MG tablet Take 1 tablet (10 mg total) by mouth daily.  30 tablet  6  . diazepam (VALIUM) 10 MG tablet Take 10 mg by mouth every 6 (six) hours as needed for anxiety.      . docusate sodium (COLACE) 100 MG capsule Take 1 capsule (100 mg total) by mouth at bedtime.  30 capsule  3  . glimepiride (AMARYL) 4 MG tablet Take 4 mg by mouth 2 (two) times daily.      . hydrochlorothiazide (HYDRODIURIL) 25 MG tablet Take 1 tablet (25 mg total) by mouth daily.  30 tablet  6  . HYDROcodone-acetaminophen (NORCO) 10-325 MG per tablet Take 1 tablet by mouth every 6 (six) hours as needed for moderate pain.       [name] losartan (COZAAR) 100  MG tablet TAKE ONE TABLET BY MOUTH ONCE DAILY  30 tablet  6  . metFORMIN (GLUCOPHAGE) 500 MG tablet Take 500 mg by mouth 2 (two) times daily with a meal.       . metoprolol (LOPRESSOR) 100 MG tablet Take 1 tablet (100 mg total) by mouth 2 (two) times daily.  60 tablet  6  . naproxen (NAPROSYN) 500 MG tablet Take 1 tablet by mouth 2 (two) times daily.      .  pantoprazole  40 mg daily    . potassium chloride SA (K-DUR,KLOR-CON) 20 MEQ tablet Take 20 mEq by mouth daily.      . ranitidine (ZANTAC) 150 MG tablet Take 300 mg by mouth daily.         Allergies as of 03/01/2014 - Review Complete 03/01/2014  Allergen Reaction Noted  . Niacin-lovastatin er Other (See Comments) 10/18/2011  . Zocor [simvastatin - high dose] Other (See Comments) 10/18/2011    Past Medical History  Diagnosis Date  . Essential hypertension, benign   . Type 2 diabetes mellitus   . Mixed hyperlipidemia     Statin intolerance  . Gout   . Sleep apnea     questionable. uses oxygen at bedtime at times. no formal sleep study.  . Rotator cuff tear     chronic pain  . Tubular adenoma     Past Surgical History  Procedure Laterality Date  . Appendectomy    . Colonoscopy N/A 10/29/2013    Dr. [name]- sigmoid polyp status post cold snare removal. large sessile ascending colon polyp. debulked with saline assisted snare polypectomy . pancolonic diverticulosis. inadequate prep. tubular adenoma on bx  . Colonoscopy N/A 02/22/2014    Dr. [name]: Saline-assisted snare polypectomy/biopsy for sprawling carpet polyp in the ascending colon which could not be completely removed. Status post biopsy (tubular adenoma), tattooed  . Esophagogastroduodenoscopy N/A 02/22/2014    Dr. [name]: Erosive reflux esophagitis. Schatzki ring status post dilation. Gastric and duodenal erosions with benign biopsies.  Azzie Almas dilation N/A 02/22/2014    Procedure: Azzie Almas DILATION;  Surgeon: [name], MD;  [facility];  Service: Endoscopy;   Laterality: N/A;  [name] dilation N/A 02/22/2014    Procedure: [name] DILATION;  Surgeon: [name], MD;  [facility];  Service: Endoscopy;  Laterality: N/A;    Family History  Problem Relation Age of Onset  . Diabetes type II Mother   . Colon cancer Neg Hx     History   Social History  . Marital Status: Married    Spouse Name: N/A    Number of Children: 2  . Years of Education: N/A   Occupational History  . Clinical biochemist   .     Social History Main Topics  . Smoking status: Former Smoker    Types: Cigarettes  . Smokeless tobacco: Never Used     Comment: quit 35 years ago  . Alcohol Use: No     Comment: None in over a year. prior to that 1-2 beers couple times per week.  . Drug Use: No  . Sexual Activity: Not on file   Other Topics Concern  . Not on file   Social History Narrative  . No narrative on file     ROS:  General: Negative for anorexia, weight loss, fever, chills, fatigue, weakness. Eyes: Negative for vision changes.  ENT: Negative for hoarseness, difficulty swallowing , nasal congestion.lesion on nose for one month will not heal,  CV: Negative for chest pain, angina, palpitations, dyspnea on exertion, peripheral edema.  Respiratory: Negative for dyspnea at rest, dyspnea on exertion, cough, sputum, wheezing.  GI: See history of present illness. GU:  Negative for dysuria, hematuria, urinary incontinence, urinary frequency, nocturnal urination.  MS: Negative for joint pain, low back pain.  Derm: Negative for rash or itching.  Neuro: Negative for weakness, abnormal sensation, seizure, frequent headaches, memory loss, confusion.  Psych: Negative for anxiety, depression, suicidal ideation, hallucinations.  Endo: Negative for unusual weight change.  Heme: Negative for bruising or bleeding. Allergy: Negative for rash or hives.       Physical Examination: Vital signs in last 24 hours: Temp:  [98.1 F (36.7 C)] 98.1 F (36.7 C) (06/15  1010) Pulse Rate:  [53] 53 (06/15 1010) Resp:  [20] 20 (06/15 1010) BP: (161)/(95) 161/95 mmHg (06/15 1010) SpO2:  [98 %] 98 % (06/15 1010)    General: Well-nourished, well-developed in no acute distress.  Head: Normocephalic, atraumatic.  Lesion on right side of nose with scab Eyes: Conjunctiva pink, no icterus. Mouth: Oropharyngeal mucosa moist and pink , no lesions erythema or exudate. Neck: Supple without thyromegaly, masses, or lymphadenopathy.  Lungs: Clear to auscultation bilaterally.  Heart: Regular rate and rhythm, no murmurs rubs or gallops.  Abdomen: Bowel sounds are normal, nontender, nondistended, no hepatosplenomegaly or masses, no abdominal bruits or    hernia ,  no rebound or guarding.   Rectal: not performed Extremities: No lower extremity edema, clubbing, deformity.  Neuro: Alert and oriented x 4 , grossly normal neurologically.  Skin: Warm and dry, no rash or jaundice.   Psych: Alert and cooperative, normal mood and affect.        Intake/Output from previous day:   Intake/Output this shift:    Lab Results: CBC  Recent Labs  03/01/14 1027  WBC 10.2  HGB 13.2  HCT 38.8*  MCV 85.8  PLT 198   BMET  Recent Labs  03/01/14 1027  NA 142  K 3.8  CL 99  CO2 28  GLUCOSE 186*  BUN 24*  CREATININE 1.30  CALCIUM 9.1   LFT No results found for this basename: BILITOT, BILIDIR, IBILI, ALKPHOS, AST, ALT, PROT, ALBUMIN,  in the last 72 hours  Lipase No results found for this basename: LIPASE,  in the last 72 hours  PT/INR No results found for this basename: LABPROT, INR,  in the last 72 hours    Imaging Studies: No results found.Minnie.Brome week]   Impression: 63 year old gentleman with GI bleed likely post-polypectomy bleed. Ascending colon lesion partially debulked on June 8 and needs right hemicolectomy for incomplete removal. Patient has appointment this Thursday with Dr. Fanny Skates for consultation. EGD at the same time with duodenal/gastric erosions,  erosive esophagitis.   Plan: 1. Recommend overnight observation. Patient may stop bleeding without any intervention.  2. Repeat H/H at 2pm. 3. PPI. 4. Avoid all NSAIDs and ASA for now.  5. I discussed findings and recommendations with both Dr. Roderic Palau, and Kem Parkinson, PA-C. They will contact hospitalist service for admission.  We would like to thank you for the opportunity to participate in the care of Benjamin [name].    LOS: 0 days   Neil Crouch  03/01/2014, 11:05 AM  Attending note:  Did taking Naprosyn since last week. Had another rather large grossly bloody/clotting stool this afternoon.   Most likely bleeding from the ascending polypectomy site. Discuss with him watchful waiting over the next 24 hours versus going on a head  and doing a colonoscopy now (blood is a good cathartic). Last solid food-tomato sandwich last night. Has not had any stool today aside from blood. After some discussion, patient wants to have a colonoscopy now. We'll get him down in the near future and perform colonoscopy for bleeding control therapy. Risks, benefits, limitations and alternatives have been reviewed. Questions been answered.  I appreciate the hospitalist's assistance with this patient.

## 2014-03-02 DIAGNOSIS — E669 Obesity, unspecified: Secondary | ICD-10-CM

## 2014-03-02 DIAGNOSIS — K625 Hemorrhage of anus and rectum: Secondary | ICD-10-CM

## 2014-03-02 DIAGNOSIS — R011 Cardiac murmur, unspecified: Secondary | ICD-10-CM

## 2014-03-02 DIAGNOSIS — E785 Hyperlipidemia, unspecified: Secondary | ICD-10-CM

## 2014-03-02 LAB — HEMOGLOBIN A1C
HEMOGLOBIN A1C: 6.9 % — AB (ref <5.7)
MEAN PLASMA GLUCOSE: 151 mg/dL — AB (ref <117)

## 2014-03-02 LAB — GLUCOSE, CAPILLARY
GLUCOSE-CAPILLARY: 198 mg/dL — AB (ref 70–99)
Glucose-Capillary: 189 mg/dL — ABNORMAL HIGH (ref 70–99)

## 2014-03-02 LAB — BASIC METABOLIC PANEL
BUN: 22 mg/dL (ref 6–23)
CO2: 30 mEq/L (ref 19–32)
CREATININE: 1.15 mg/dL (ref 0.50–1.35)
Calcium: 8.7 mg/dL (ref 8.4–10.5)
Chloride: 103 mEq/L (ref 96–112)
GFR, EST AFRICAN AMERICAN: 76 mL/min — AB (ref >90)
GFR, EST NON AFRICAN AMERICAN: 65 mL/min — AB (ref >90)
Glucose, Bld: 155 mg/dL — ABNORMAL HIGH (ref 70–99)
Potassium: 3.7 mEq/L (ref 3.7–5.3)
Sodium: 143 mEq/L (ref 137–147)

## 2014-03-02 LAB — CBC
HCT: 34.9 % — ABNORMAL LOW (ref 39.0–52.0)
Hemoglobin: 11.9 g/dL — ABNORMAL LOW (ref 13.0–17.0)
MCH: 29.2 pg (ref 26.0–34.0)
MCHC: 34.1 g/dL (ref 30.0–36.0)
MCV: 85.5 fL (ref 78.0–100.0)
PLATELETS: 187 10*3/uL (ref 150–400)
RBC: 4.08 MIL/uL — ABNORMAL LOW (ref 4.22–5.81)
RDW: 14.2 % (ref 11.5–15.5)
WBC: 11.6 10*3/uL — AB (ref 4.0–10.5)

## 2014-03-02 MED ORDER — PANTOPRAZOLE SODIUM 40 MG PO TBEC: 40.0000 mg | DELAYED_RELEASE_TABLET | Freq: Every day | ORAL | Status: DC

## 2014-03-02 MED ORDER — POLYETHYLENE GLYCOL 3350 17 GM/SCOOP PO POWD: 1.0000 | Freq: Once | ORAL | Status: DC

## 2014-03-02 NOTE — Progress Notes (Signed)
REVIEWED.  

## 2014-03-02 NOTE — Progress Notes (Signed)
Patient discharged to home. Instructions given regarding diet, activity, CHF, follow up appointments, and reasons to seek emergency medical care. IV removed, site clean, dry, and intact. Patient was able to "teach back" instructions.

## 2014-03-02 NOTE — Discharge Summary (Signed)
Physician Discharge Summary  [name]:[id number] DOB: 11-Mar-1951 DOA: 03/01/2014  PCP: Leonides Grills, MD  Admit date: 03/01/2014 Discharge date: 03/02/2014  Time spent: 40 minutes  Recommendations for Outpatient Follow-up:  1. Followup with Dr. Dalbert Batman in 2 days. 2. No MRI for at least 30 days or check for hemostasis clips prior to obtain an MRI.  Discharge Diagnoses:  Principal Problem:   Rectal bleeding Active Problems:   Hypertension   Hyperlipidemia   Type II or unspecified type diabetes mellitus without mention of complication, uncontrolled   Diverticulosis of colon   Discharge Condition: Stable  Diet recommendation: Carb modified diet  Filed Weights   03/01/14 1341  Weight: 113.399 kg (250 lb)    History of present illness:  Benjamin [name] is a 63 y.o. male history of HTN and DM 2, came into the hospital because of rectal bleeding. Patient had recent colonoscopy done on 02/22/14 by Dr. [name] with partial removal of carpet polyp, please note that colonoscopy also showed pandiverticulosis. Since then the patient was in his usual state of health until last night when he started to have bright red blood per rectum, t had 2 bloody bowel movements since then, came in this morning to the ED for further evaluation. He denies nausea, vomiting, abdominal pain or any other symptoms.  In the ED his vitals are stable, hemoglobin is 13.2, seen by GI and recommended to keep her overnight for further observation.  Hospital Course:   Rectal bleeding  -Presented with 2 bloody bowel movements, had recent colonoscopy with partial removal of carpet polyp.  -Bleeding is secondary to post polypectomy hemorrhage.  -Patient underwent colonoscopy yesterday, bleeding was found at the polypectomy site. -Bleeding stopped by applying 6 hemostasis clips. -Didn't pass earlier today, patient will follow with general surgery in 2 days.  Colonic polyp  -Pathology showed tubular adenoma no  high grade dysplasia or carcinoma.   DM 2  -Patient on clear liquids, hold oral hypoglycemic agents.  -A1c is 6.9, controlled diabetes mellitus.   Hypertension  -Home medications restarted.   Procedures:  Colonoscopy done on 03/01/2014 by Dr. [name] with findings of post polypectomy hemorrhage, status post bleeding control therapy with deployment of 6 hemostasis clips.  Consultations:  Gastroenterology  Discharge Exam: Filed Vitals:   03/02/14 0611  BP: 140/61  Pulse: 50  Temp: 97.8 F (36.6 C)  Resp: 20   General: Alert and awake, oriented x3, not in any acute distress. HEENT: anicteric sclera, pupils reactive to light and accommodation, EOMI CVS: S1-S2 clear, no murmur rubs or gallops Chest: clear to auscultation bilaterally, no wheezing, rales or rhonchi Abdomen: soft nontender, nondistended, normal bowel sounds, no organomegaly Extremities: no cyanosis, clubbing or edema noted bilaterally Neuro: Cranial nerves II-XII intact, no focal neurological deficits  Discharge Instructions You were cared for by a hospitalist during your hospital stay. If you have any questions about your discharge medications or the care you received while you were in the hospital after you are discharged, you can call the unit and asked to speak with the hospitalist on call if the hospitalist that took care of you is not available. Once you are discharged, your primary care physician will handle any further medical issues. Please note that NO REFILLS for any discharge medications will be authorized once you are discharged, as it is imperative that you return to your primary care physician (or establish a relationship with a primary care physician if you do not have one) for your  aftercare needs so that they can reassess your need for medications and monitor your lab values.  Discharge Instructions   Diet - low sodium heart healthy    Complete by:  As directed      Increase activity slowly     Complete by:  As directed             Medication List    STOP taking these medications       docusate sodium 100 MG capsule  Commonly known as:  COLACE     naproxen 500 MG tablet  Commonly known as:  NAPROSYN      TAKE these medications       allopurinol 100 MG tablet  Commonly known as:  ZYLOPRIM  Take 100 mg by mouth daily.     amLODipine 10 MG tablet  Commonly known as:  NORVASC  Take 1 tablet (10 mg total) by mouth daily.     CINNAMON PO  Take 1 tablet by mouth daily.     diazepam 10 MG tablet  Commonly known as:  VALIUM  Take 10 mg by mouth every 6 (six) hours as needed for anxiety.     FISH OIL PO  Take 1 capsule by mouth daily.     glimepiride 4 MG tablet  Commonly known as:  AMARYL  Take 4 mg by mouth 2 (two) times daily.     hydrochlorothiazide 25 MG tablet  Commonly known as:  HYDRODIURIL  Take 1 tablet (25 mg total) by mouth daily.     HYDROcodone-acetaminophen 10-325 MG per tablet  Commonly known as:  NORCO  Take 1 tablet by mouth every 6 (six) hours as needed for moderate pain.     losartan 100 MG tablet  Commonly known as:  COZAAR  TAKE ONE TABLET BY MOUTH ONCE DAILY     metFORMIN 500 MG tablet  Commonly known as:  GLUCOPHAGE  Take 500 mg by mouth 2 (two) times daily with a meal.     metoprolol 100 MG tablet  Commonly known as:  LOPRESSOR  Take 1 tablet (100 mg total) by mouth 2 (two) times daily.     NIACIN PO  Take 1 tablet by mouth daily.     pantoprazole 40 MG tablet  Commonly known as:  PROTONIX  Take 1 tablet by mouth daily.     polyethylene glycol powder powder  Commonly known as:  MIRALAX  Take 255 g (1 Container total) by mouth once.     potassium chloride SA 20 MEQ tablet  Commonly known as:  K-DUR,KLOR-CON  Take 20 mEq by mouth daily.     RED YEAST RICE PO  Take 1 tablet by mouth daily.       Allergies  Allergen Reactions  . Niacin-Lovastatin Er Other (See Comments)    Body aches  . Zocor [Simvastatin - High  Dose] Other (See Comments)    Body aches.       Follow-up Information   Follow up with [name], MD On 03/04/2014.   Specialty:  General Surgery   Contact information:   [address] [place] [address] [phone]        The results of significant diagnostics from this hospitalization (including imaging, microbiology, ancillary and laboratory) are listed below for reference.    Significant Diagnostic Studies: No results found.  Microbiology: No results found for this or any previous visit (from the past 240 hour(s)).   Labs: Basic Metabolic Panel:  Recent Labs Lab 03/01/14 1027 03/02/14 0506  NA 142 143  K 3.8 3.7  CL 99 103  CO2 28 30  GLUCOSE 186* 155*  BUN 24* 22  CREATININE 1.30 1.15  CALCIUM 9.1 8.7   Liver Function Tests: No results found for this basename: AST, ALT, ALKPHOS, BILITOT, PROT, ALBUMIN,  in the last 168 hours No results found for this basename: LIPASE, AMYLASE,  in the last 168 hours No results found for this basename: AMMONIA,  in the last 168 hours CBC:  Recent Labs Lab 03/01/14 1027 03/01/14 1519 03/02/14 0506  WBC 10.2  --  11.6*  NEUTROABS 6.1  --   --   HGB 13.2 12.3* 11.9*  HCT 38.8* 36.6* 34.9*  MCV 85.8  --  85.5  PLT 198  --  187   Cardiac Enzymes: No results found for this basename: CKTOTAL, CKMB, CKMBINDEX, TROPONINI,  in the last 168 hours BNP: BNP (last 3 results) No results found for this basename: PROBNP,  in the last 8760 hours CBG:  Recent Labs Lab 03/01/14 1616 03/01/14 2254 03/02/14 0955 03/02/14 1124  GLUCAP 139* 109* 198* 189*       Signed:  ELMAHI,MUTAZ A  [facility] 03/02/2014, 1:33 PM

## 2014-03-02 NOTE — Progress Notes (Signed)
    Subjective: No rectal bleeding or abdominal pain. Ready to advance diet.   Objective: Vital signs in last 24 hours: Temp:  [97.4 F (36.3 C)-98.1 F (36.7 C)] 97.8 F (36.6 C) (06/16 0611) Pulse Rate:  [49-56] 50 (06/16 0611) Resp:  [8-20] 20 (06/16 0611) BP: (111-161)/(56-95) 140/61 mmHg (06/16 0611) SpO2:  [90 %-100 %] 98 % (06/16 0611) Weight:  [250 lb (113.399 kg)] 250 lb (113.399 kg) (06/15 1341) Last BM Date: 03/01/14 General:   Alert and oriented, pleasant Abdomen:  Bowel sounds present, soft, non-tender, non-distended.  Neurologic:  Alert and  oriented x4;  grossly normal neurologically. Skin:  Warm and dry, intact without significant lesions.  Psych:  Alert and cooperative. Normal mood and affect.  Intake/Output from previous day: 06/15 0701 - 06/16 0700 In: 240 [P.O.:240] Out: -  Intake/Output this shift:    Lab Results:  Recent Labs  03/01/14 1027 03/01/14 1519 03/02/14 0506  WBC 10.2  --  11.6*  HGB 13.2 12.3* 11.9*  HCT 38.8* 36.6* 34.9*  PLT 198  --  187   BMET  Recent Labs  03/01/14 1027 03/02/14 0506  NA 142 143  K 3.8 3.7  CL 99 103  CO2 28 30  GLUCOSE 186* 155*  BUN 24* 22  CREATININE 1.30 1.15  CALCIUM 9.1 8.7    Assessment: 63 year old male admitted with post-polypectomy bleed, s/p therapeutic colonoscopy on 6/15 with bleeding control, 6 hemostasis clips. Clinically stable at this time, with hopeful discharge soon. Appt with Dr. Dalbert Batman upcoming this Thursday to discuss right hemicolectomy due to significant polyp unable to be completely removed endoscopically on original colonoscopy.   Plan: Advance diet Monitor for any further bleeding No MRI for 30 days or until clips are known to have passed Dr. Dalbert Batman Thursday for discussion of hemicolectomy Hopeful discharge later this evening or tomorrow morning.  Orvil Feil, ANP-BC [facility] Gastroenterology    LOS: 1 day    03/02/2014, 9:58 AM

## 2014-03-02 NOTE — ED Provider Notes (Signed)
Medical screening examination/treatment/procedure(s) were performed by non-physician practitioner and as supervising physician I was immediately available for consultation/collaboration.   EKG Interpretation None        Maudry Diego, MD 03/02/14 1525

## 2014-03-04 ENCOUNTER — Telehealth (INDEPENDENT_AMBULATORY_CARE_PROVIDER_SITE_OTHER): Payer: Self-pay

## 2014-03-04 ENCOUNTER — Other Ambulatory Visit: Payer: Self-pay | Admitting: Adult Health

## 2014-03-04 ENCOUNTER — Ambulatory Visit (INDEPENDENT_AMBULATORY_CARE_PROVIDER_SITE_OTHER): Payer: BC Managed Care – PPO | Admitting: General Surgery

## 2014-03-04 ENCOUNTER — Encounter (HOSPITAL_COMMUNITY): Payer: Self-pay | Admitting: Internal Medicine

## 2014-03-04 ENCOUNTER — Encounter (INDEPENDENT_AMBULATORY_CARE_PROVIDER_SITE_OTHER): Payer: Self-pay

## 2014-03-04 VITALS — BP 118/70 | HR 56 | Temp 98.3°F | Resp 14 | Ht 73.0 in | Wt 252.8 lb

## 2014-03-04 DIAGNOSIS — Z860100 Personal history of colon polyps, unspecified: Secondary | ICD-10-CM | POA: Insufficient documentation

## 2014-03-04 NOTE — Patient Instructions (Signed)
You have a precancerous colon polyp on the right side of the colon. This cannot be completely removed colonoscopically because it is adherent to the wall the colon like a large carpet. There is no evidence of cancer at this time.  This section of your colon should be removed surgically to prevent progression to cancer.  Dr. Domenic Polite will need to perform a risk assessment before we proceed with the surgery.  Once your cardiologist has evaluated you, you will be scheduled for a laparoscopic assisted right colectomy, possible open colectomy at [facility] in the future.  You will need to undergo a bowel prep the day before surgery as discussed.    Laparoscopic Colectomy Laparoscopic colectomy is surgery to remove part or all of the large intestine (colon). This procedure is used to treat several conditions, including:  Inflammation and infection of the colon (diverticulitis).  Tumors or masses in the colon.  Inflammatory bowel disease, such as Crohn disease or ulcerative colitis. Colectomy is an option when symptoms cannot be controlled with medicines.  Bleeding from the colon that cannot be controlled by another method.  Blockage or obstruction of the colon. LET [facility] CARE Benjamin [name] KNOW ABOUT:  Any allergies you have.  All medicines you are taking, including vitamins, herbs, eye drops, creams, and over-the-counter medicines.  Previous problems you or members of your family have had with the use of anesthetics.  Any blood disorders you have.  Previous surgeries you have had.  Medical conditions you have. RISKS AND COMPLICATIONS Generally, this is a safe procedure. However, as with any procedure, complications can occur. Possible complications include:  Infection.  Bleeding.  Damage to other organs.  Leaking from where the colon was sewn together.  Future blockage of the small intestines from scar tissue. Another surgery may be needed to repair this. In some  cases, complications such as damage to other organs or excessive bleeding may require the surgeon to convert from a laparoscopic procedure to an open procedure. This involves making a larger incision in the abdomen to perform the procedure. BEFORE THE PROCEDURE  Ask your health care Hyman Crossan about changing or stopping any regular medicines.  You may be prescribed an oral bowel prep. This involves drinking a large amount of medicated liquid, starting the day before your surgery. The liquid will cause you to have multiple loose stools until your stool is almost clear or light green. This cleans out your colon in preparation for the surgery.  Do not eat or drink anything else once you have started the bowel prep, unless your health care Mansel Strother tells you it is safe to do so.  You may also be given antibiotic pills to clean out your colon of bacteria. Be sure to follow the directions carefully and take the medicine at the correct time. PROCEDURE   Small monitors will be put on your body. They are used to check your heart, blood pressure, and oxygen level.  An IV access tube will be put into one of your veins. Medicine will be able to flow directly into your body through this IV tube.  You might be given a medicine to help you relax (sedative).  You will be given a medicine to make you sleep through the procedure (general anesthetic). A breathing tube may be placed into your lungs during the procedure.  A thin, flexible tube (catheter) will be placed into your bladder to collect urine.  A tube may be put in through your nose. It is called a  nasogastric tube. It is used to remove stomach fluids after surgery until the intestines start working again.  Your abdomen will be filled with air so that it expands. This gives the surgeon more room to operate and makes your organs easier to see.  Several small cuts (incisions) are made in your abdomen.  A thin, lighted tube with a tiny camera on the end  (laparoscope) is put through one of the small incisions. The camera on the laparoscope sends a picture to a TV screen in the operating room. This gives the surgeon a good view inside your abdomen.  Hollow tubes are put through the other small incisions in your abdomen. The tools needed for the procedure are put through these tubes.  Clamps or staples are put on both ends of the diseased part of the colon.  The part of the intestine between the clamps or staples is removed.  If possible, the ends of the healthy colon that remain will be stitched or stapled together to allow your body to expel waste (stool).  Sometimes, the remaining colon cannot be stitched back together. If this is the case, a colostomy is needed. For a colostomy:  An opening (stoma) to the outside of your body is made through the abdomen.  The end of the colon is brought to the opening. It is stitched to the skin.  A bag is attached to the opening. Stool will drain into this bag. The bag is removable.  The colostomy can be temporary or permanent.  The incisions from the colectomy are closed with stitches or staples. AFTER THE PROCEDURE  You will be monitored closely in a recovery area until you are stable and doing well. You will then be moved to a regular hospital room.  You will need to receive fluids through an IV tube until your bowel function has returned. This may take 1-3 days. Once your bowels are working again, you will be started on clear liquids and then advanced to solid food as tolerated.  You will be given pain medicines to control your pain. Document Released: 11/24/2002 Document Revised: 06/24/2013 Document Reviewed: 04/15/2013 [facility] Patient Information 2015 [place], [place]. This information is not intended to replace advice given to you by your health care Paarth Cropper. Make sure you discuss any questions you have with your health care Chen Holzman.

## 2014-03-04 NOTE — Telephone Encounter (Signed)
Pt given one day golytely prep and cipro/ flagyl rx. Pt aware cardiac clearance will be needed due to CHF. Request for clearance sent to Dr Domenic Polite and pt given copy of clearance letter for his appt 7-8 with Dr Domenic Polite.

## 2014-03-04 NOTE — Progress Notes (Addendum)
Patient ID: Benjamin [name], male   DOB: [birth year], 63 y.o.   MRN: [id number]  Chief Complaint  Patient presents with  . Colon Polyps   Note: This dictation was prepared with Dragon/digital dictation along with Smartphrase technology. Any transcriptional errors that result from this process are unintentional.     HPI Benjamin [name] is a 63 y.o. male.  He is referred by Dr. Milton Ferguson in [place]  for evaluation and surgical management of a sessile adenomatous polyp of the right colon which cannot be removed colonoscopically. Dr. Johnny Bridge is his cardiologist. Dr. Orson Ape is his PCP.   This patient does not have any GI or abdominal symptoms. No abnormal pain or alteration of bowel habits. No blood in the stool. No weight loss. No anemia. He went for his first screening colonoscopy in February of this year and Dr. [name] found a sessile tubular adenoma which he shaved off incompletely. Pathology showed no malignancy or atypia he returned for followup colonoscopy on 02/22/2014 and underwent another incomplete debulking of the tumor. The tumor was described as being frond-like polyp 7-8 cm distal to the ileocecal valve. Dimensions of the polyp were 2 x 3 cm. This was extensively tattooed. He had to go back and have a bleeder clipped but then has done very well. He was referred for elective right colectomy because of a precancerous neoplastic polyp of the right colon. The distal colon seems fine.  Comorbidities include management of congestive heart failure by cardiology. The patient does not have any chest pain and states that he can climb 3-4 flights of stairs but eventually gets a little short of breath. Hypertension. Type 2 diabetes. Hyperlipidemia. Gout. Sleep apnea. Status post open appendectomy. GERD well controlled with proton pump inhibitors.  Social history reveals that he had to retire following his treatment for congestive heart failure. He was Clinical biochemist. Family history is negative for  colon cancer. HPI  Past Medical History  Diagnosis Date  . Essential hypertension, benign   . Type 2 diabetes mellitus   . Mixed hyperlipidemia     Statin intolerance  . Gout   . Sleep apnea     questionable. uses oxygen at bedtime at times. no formal sleep study.  . Rotator cuff tear     chronic pain  . Tubular adenoma     Past Surgical History  Procedure Laterality Date  . Appendectomy    . Colonoscopy N/A 10/29/2013    Dr. [name]- sigmoid polyp status post cold snare removal. large sessile ascending colon polyp. debulked with saline assisted snare polypectomy . pancolonic diverticulosis. inadequate prep. tubular adenoma on bx  . Colonoscopy N/A 02/22/2014    Dr. [name]: Saline-assisted snare polypectomy/biopsy for sprawling carpet polyp in the ascending colon which could not be completely removed. Status post biopsy (tubular adenoma), tattooed  . Esophagogastroduodenoscopy N/A 02/22/2014    Dr. [name]: Erosive reflux esophagitis. Schatzki ring status post dilation. Gastric and duodenal erosions with benign biopsies.  Azzie Almas dilation N/A 02/22/2014    Procedure: Azzie Almas DILATION;  Surgeon: [name], MD;  [facility]: [facility];  Service: Endoscopy;  Laterality: N/A;  [name] dilation N/A 02/22/2014    Procedure: [name] DILATION;  Surgeon: [name], MD;  Location: [facility];  Service: Endoscopy;  Laterality: N/A;  . Colonoscopy N/A 03/01/2014    Procedure: COLONOSCOPY;  Surgeon: [name], MD;  Location: [facility];  Service: Endoscopy;  Laterality: N/A;    Family History  Problem Relation  Age of Onset  . Diabetes type II Mother   . Colon cancer Neg Hx     Social History History  Substance Use Topics  . Smoking status: Former Smoker    Types: Cigarettes  . Smokeless tobacco: Never Used     Comment: quit 35 years ago  . Alcohol Use: No     Comment: None in over a year. prior to that 1-2 beers couple times per week.    Allergies  Allergen Reactions   . Niacin-Lovastatin Er Other (See Comments)    Body aches  . Zocor [Simvastatin - High Dose] Other (See Comments)    Body aches.    Current Outpatient Prescriptions  Medication Sig Dispense Refill  . allopurinol (ZYLOPRIM) 100 MG tablet Take 100 mg by mouth daily.      [name] amLODipine (NORVASC) 10 MG tablet Take 1 tablet (10 mg total) by mouth daily.  30 tablet  6  . CINNAMON PO Take 1 tablet by mouth daily.      . diazepam (VALIUM) 10 MG tablet Take 10 mg by mouth every 6 (six) hours as needed for anxiety.      [name] glimepiride (AMARYL) 4 MG tablet Take 4 mg by mouth 2 (two) times daily.      . hydrochlorothiazide (HYDRODIURIL) 25 MG tablet Take 1 tablet (25 mg total) by mouth daily.  30 tablet  6  . HYDROcodone-acetaminophen (NORCO) 10-325 MG per tablet Take 1 tablet by mouth every 6 (six) hours as needed for moderate pain.       [name] KLOR-CON M20 20 MEQ tablet TAKE TWO TABLETS BY MOUTH ONCE DAILY  60 tablet  6  . losartan (COZAAR) 100 MG tablet TAKE ONE TABLET BY MOUTH ONCE DAILY  30 tablet  6  . metFORMIN (GLUCOPHAGE) 500 MG tablet Take 500 mg by mouth 2 (two) times daily with a meal.       . metoprolol (LOPRESSOR) 100 MG tablet Take 1 tablet (100 mg total) by mouth 2 (two) times daily.  60 tablet  6  . NIACIN PO Take 1 tablet by mouth daily.      . Omega-3 Fatty Acids (FISH OIL PO) Take 1 capsule by mouth daily.      . pantoprazole (PROTONIX) 40 MG tablet Take 1 tablet by mouth daily.      . polyethylene glycol powder (MIRALAX) powder Take 255 g (1 Container total) by mouth once.  527 g  0  . potassium chloride SA (K-DUR,KLOR-CON) 20 MEQ tablet Take 20 mEq by mouth daily.      . Red Yeast Rice Extract (RED YEAST RICE PO) Take 1 tablet by mouth daily.       No current facility-administered medications for this visit.    Review of Systems Review of Systems  Constitutional: Negative for fever, chills and unexpected weight change.  HENT: Negative for congestion, hearing loss, sore throat,  trouble swallowing and voice change.   Eyes: Negative for visual disturbance.  Respiratory: Positive for shortness of breath. Negative for cough and wheezing.   Cardiovascular: Negative for chest pain, palpitations and leg swelling.  Gastrointestinal: Negative for nausea, vomiting, abdominal pain, diarrhea, constipation, blood in stool, abdominal distention, anal bleeding and rectal pain.  Genitourinary: Negative for hematuria and difficulty urinating.  Musculoskeletal: Negative for arthralgias.  Skin: Negative for rash and wound.  Neurological: Negative for seizures, syncope, weakness and headaches.  Hematological: Negative for adenopathy. Does not bruise/bleed easily.  Psychiatric/Behavioral: Negative for confusion.  Blood pressure 118/70, pulse 56, temperature 98.3 F (36.8 C), resp. rate 14, height 6\' 1"  (1.854 m), weight 252 lb 12.8 oz (114.669 kg).  Physical Exam Physical Exam  Constitutional: He is oriented to person, place, and time. He appears well-developed and well-nourished. No distress.  BMI 35.  HENT:  Head: Normocephalic.  Nose: Nose normal.  Mouth/Throat: No oropharyngeal exudate.  Eyes: Conjunctivae and EOM are normal. Pupils are equal, round, and reactive to light. Right eye exhibits no discharge. Left eye exhibits no discharge. No scleral icterus.  Neck: Normal range of motion. Neck supple. No JVD present. No tracheal deviation present. No thyromegaly present.  Cardiovascular: Normal rate, regular rhythm, normal heart sounds and intact distal pulses.   No murmur heard. Pulmonary/Chest: Effort normal and breath sounds normal. No stridor. No respiratory distress. He has no wheezes. He has no rales. He exhibits no tenderness.  Abdominal: Soft. Bowel sounds are normal. He exhibits no distension and no mass. There is no tenderness. There is no rebound and no guarding.  Right lower quadrant scar from appendectomy, well-healed  Musculoskeletal: Normal range of motion. He  exhibits no edema and no tenderness.  Lymphadenopathy:    He has no cervical adenopathy.  Neurological: He is alert and oriented to person, place, and time. He has normal reflexes. Coordination normal.  Skin: Skin is warm and dry. No rash noted. He is not diaphoretic. No erythema. No pallor.  Psychiatric: He has a normal mood and affect. His behavior is normal. Judgment and thought content normal.    Data Reviewed I have reviewed both colonoscopy report, both pathology reports, available office notes.  Assessment    Sessile tubular adenoma of the ascending colon, incompletely restricted despite 2 attempts. Agree with recommendation for elective right colectomy  History congestive heart failure, minimally symptomatic. Cardiology followup scheduled for July 8 routinely.  History of open appendectomy  GERD Hypertension Type 2 diabetes Gout Sleep apnea  BMI 35    Plan    I  had a long discussion with the patient and his wife.[name] He understands and would like to proceed with surgery as soon as possible. He knows this is not an emergency, but is still anxious,  We will try to accomodate him.  We'll schedule for laparoscopic-assisted right colectomy, possible open colectomy in the near future. We'll do this at [facility] hospital  Will need preoperative cardiac risk assessment with Dr. Johnny Bridge. I told the  patient to call and see if appt. could   be moved up.  He will undergo a one-day bowel prep preop...explained.  Entereg protocol...explained  Continue beta blockers perioperatively        Edsel Petrin. Dalbert Batman, M.D., [facility] General and Minimally invasive Surgery Breast and Colorectal Surgery Office:   [phone] Pager:   [phone]  03/04/2014, 12:05 PM

## 2014-03-16 ENCOUNTER — Ambulatory Visit (INDEPENDENT_AMBULATORY_CARE_PROVIDER_SITE_OTHER): Payer: BC Managed Care – PPO | Admitting: General Surgery

## 2014-03-24 ENCOUNTER — Ambulatory Visit (INDEPENDENT_AMBULATORY_CARE_PROVIDER_SITE_OTHER): Payer: BC Managed Care – PPO | Admitting: Cardiology

## 2014-03-24 ENCOUNTER — Encounter: Payer: Self-pay | Admitting: Cardiology

## 2014-03-24 VITALS — BP 120/70 | HR 60 | Ht 73.0 in | Wt 254.0 lb

## 2014-03-24 DIAGNOSIS — IMO0001 Reserved for inherently not codable concepts without codable children: Secondary | ICD-10-CM

## 2014-03-24 DIAGNOSIS — I119 Hypertensive heart disease without heart failure: Secondary | ICD-10-CM | POA: Insufficient documentation

## 2014-03-24 DIAGNOSIS — E1165 Type 2 diabetes mellitus with hyperglycemia: Secondary | ICD-10-CM

## 2014-03-24 DIAGNOSIS — Z01810 Encounter for preprocedural cardiovascular examination: Secondary | ICD-10-CM

## 2014-03-24 DIAGNOSIS — I10 Essential (primary) hypertension: Secondary | ICD-10-CM

## 2014-03-24 NOTE — Assessment & Plan Note (Signed)
Normal LVEF with severe LVH and grade 2 diastolic dysfunction in the setting of hypertension. No active heart failure symptoms. Continue medical therapy.

## 2014-03-24 NOTE — Assessment & Plan Note (Addendum)
Patient being considered for laparoscopic-assisted right colectomy with Dr. Dalbert Batman as noted above. Blood pressure is very well controlled on the current regimen. He is clinically stable without active heart failure symptoms or chest pain, and is cleared to proceed with surgery at an acceptable perioperative cardiac risk. He should continue his antihypertensive regimen throughout.

## 2014-03-24 NOTE — Patient Instructions (Signed)
Your physician wants you to follow-up in: 6 months You will receive a reminder letter in the mail two months in advance. If you don't receive a letter, please call our office to schedule the follow-up appointment.     Your physician recommends that you continue on your current medications as directed. Please refer to the Current Medication list given to you today.      Thank you for choosing [facility] !        

## 2014-03-24 NOTE — Progress Notes (Addendum)
Clinical Summary [name] is a 63 y.o.male last seen by Ms. Lawrence NP in January of this year. He is being considered for a laparoscopic-assisted right colectomy for management of a sessile tubular adenoma that was incompletely resected by colonoscopy. Surgeon is Dr. Dalbert Batman.  He is here with his wife today. He tells me that he has been doing well with good blood pressure control on current medical regimen. He does not exercise, but with typical ADLs including outdoor garden work, he reports NYHA class II dyspnea, no exertional chest pain or palpitations.  Echocardiogram from April 2014 showed severe LVH with LVEF 93-26%, grade 2 diastolic dysfunction, mild dilated aortic root, left atrial enlargement, sclerotic aortic valve.  ECG today shows sinus rhythm with prolonged PR interval, R ' in V1, rightward axis.   Allergies  Allergen Reactions  . Niacin-Lovastatin Er Other (See Comments)    Body aches  . Zocor [Simvastatin - High Dose] Other (See Comments)    Body aches.    Current Outpatient Prescriptions  Medication Sig Dispense Refill  . allopurinol (ZYLOPRIM) 100 MG tablet Take 100 mg by mouth daily.      [name] amLODipine (NORVASC) 10 MG tablet Take 1 tablet (10 mg total) by mouth daily.  30 tablet  6  . CINNAMON PO Take 1 tablet by mouth daily.      . diazepam (VALIUM) 10 MG tablet Take 10 mg by mouth every 6 (six) hours as needed for anxiety.      [name] glimepiride (AMARYL) 4 MG tablet Take 4 mg by mouth 2 (two) times daily.      . hydrochlorothiazide (HYDRODIURIL) 25 MG tablet Take 1 tablet (25 mg total) by mouth daily.  30 tablet  6  . HYDROcodone-acetaminophen (NORCO) 10-325 MG per tablet Take 1 tablet by mouth every 6 (six) hours as needed for moderate pain.       [name] losartan (COZAAR) 100 MG tablet TAKE ONE TABLET BY MOUTH ONCE DAILY  30 tablet  6  . metFORMIN (GLUCOPHAGE) 500 MG tablet Take 500 mg by mouth 2 (two) times daily with a meal.       . metoprolol (LOPRESSOR) 100 MG  tablet Take 1 tablet (100 mg total) by mouth 2 (two) times daily.  60 tablet  6  . NIACIN PO Take 1 tablet by mouth daily.      . Omega-3 Fatty Acids (FISH OIL PO) Take 1 capsule by mouth daily.      . pantoprazole (PROTONIX) 40 MG tablet Take 1 tablet by mouth daily.      . polyethylene glycol powder (MIRALAX) powder Take 255 g (1 Container total) by mouth once.  527 g  0  . potassium chloride SA (K-DUR,KLOR-CON) 20 MEQ tablet Take 20 mEq by mouth daily.      . Red Yeast Rice Extract (RED YEAST RICE PO) Take 1 tablet by mouth daily.       No current facility-administered medications for this visit.    Past Medical History  Diagnosis Date  . Essential hypertension, benign   . Type 2 diabetes mellitus   . Mixed hyperlipidemia     Statin intolerance  . Gout   . Sleep apnea     Possible, uses oxygen at bedtime at times. no formal sleep study.  . Rotator cuff tear     Chronic pain  . Tubular adenoma   . Hypertensive heart disease     LVH with grade 2 diastolic dysfunction  Social History Benjamin [name] reports that he has quit smoking. His smoking use included Cigarettes. He smoked 0.00 packs per day. He has never used smokeless tobacco. Benjamin [name] reports that he does not drink alcohol.  Review of Systems No palpitations, dizziness, syncope. No orthopnea or PND. No leg edema. Other systems reviewed and negative.  Physical Examination Filed Vitals:   03/24/14 1116  BP: 120/70  Pulse: 60   Filed Weights   03/24/14 1116  Weight: 254 lb (115.214 kg)    Obese male in NAD.  HEENT: Conjunctiva and lids normal, oropharynx clear with moist mucosa.  Neck: Supple, no elevated JVP or carotid bruits, no thyromegaly.  Lungs: Clear to auscultation, nonlabored breathing at rest.  Cardiac: Regular rate and rhythm, no S3, 2/6 systolic murmur, no pericardial rub.  Abdomen: Protuberant, soft, nontender, bowel sounds present, no guarding or rebound.  Extremities: No pitting edema, distal  pulses 2+.  Skin: Warm and dry.  Musculoskeletal: No kyphosis.  Neuropsychiatric: Alert and oriented x3, affect grossly appropriate.   Problem List and Plan   Preoperative cardiovascular examination Patient being considered for laparoscopic-assisted right colectomy with Dr. Dalbert Batman as noted above. Blood pressure is very well controlled on the current regimen. He is clinically stable without active heart failure symptoms or chest pain, and is cleared to proceed with surgery at an acceptable perioperative cardiac risk. He should continue his antihypertensive regimen throughout.  Hypertensive heart disease Normal LVEF with severe LVH and grade 2 diastolic dysfunction in the setting of hypertension. No active heart failure symptoms. Continue medical therapy.  Essential hypertension, benign Blood pressure is controlled today.  Type II or unspecified type diabetes mellitus without mention of complication, uncontrolled Followed by Dr. Orson Ape.    Satira Sark, M.D., F.A.C.C.

## 2014-03-24 NOTE — Assessment & Plan Note (Signed)
Followed by Dr. Orson Ape.

## 2014-03-24 NOTE — Assessment & Plan Note (Signed)
Blood pressure is controlled today. 

## 2014-03-31 ENCOUNTER — Other Ambulatory Visit (INDEPENDENT_AMBULATORY_CARE_PROVIDER_SITE_OTHER): Payer: Self-pay | Admitting: General Surgery

## 2014-03-31 ENCOUNTER — Encounter: Payer: Self-pay | Admitting: *Deleted

## 2014-04-08 ENCOUNTER — Telehealth (INDEPENDENT_AMBULATORY_CARE_PROVIDER_SITE_OTHER): Payer: Self-pay

## 2014-04-08 NOTE — Telephone Encounter (Signed)
V/M Appt with DR. Dalbert Batman 04/21/14@2p 

## 2014-04-09 ENCOUNTER — Encounter (HOSPITAL_COMMUNITY): Payer: Self-pay | Admitting: Pharmacy Technician

## 2014-04-10 NOTE — Pre-Procedure Instructions (Signed)
Benjamin [name]  04/10/2014   Your procedure is scheduled on:  July 31  Report to [facility] Admitting at 09:15 AM.  Call this number if you have problems the morning of surgery: [phone]   Remember:   Do not eat food or drink liquids after midnight.   Take these medicines the morning of surgery with A SIP OF WATER: Amlodipine, Diazepam (if needed), Hydrocodone (if needed), Metoprolol, Pantoprazole,    STOP Cinnamon, Niacin, Fish Oil, Red Yeast today   STOP/ Do not take Aspirin, Aleve, Naproxen, Advil, Ibuprofen, Motrin, Vitamins, Herbs, or Supplements starting today   Do not wear jewelry, make-up or nail polish.  Do not wear lotions, powders, or perfumes. You may wear deodorant.  Do not shave 48 hours prior to surgery. Men may shave face and neck.  Do not bring valuables to the hospital.  [facility] is not responsible for any belongings or valuables.               Contacts, dentures or bridgework may not be worn into surgery.  Leave suitcase in the car. After surgery it may be brought to your room.  For patients admitted to the hospital, discharge time is determined by your treatment team.               Special Instructions: See [facility] Preparing For Surgery   Please read over the following fact sheets that you were given: Pain Booklet, Coughing and Deep Breathing, Blood Transfusion Information and Surgical Site Infection Prevention

## 2014-04-12 ENCOUNTER — Encounter (HOSPITAL_COMMUNITY)
Admission: RE | Admit: 2014-04-12 | Discharge: 2014-04-12 | Disposition: A | Discharge status: Home / Self Care | Payer: BC Managed Care – PPO | Source: Ambulatory Visit | Attending: General Surgery | Admitting: General Surgery

## 2014-04-12 ENCOUNTER — Encounter (HOSPITAL_COMMUNITY): Payer: Self-pay

## 2014-04-12 DIAGNOSIS — Z01818 Encounter for other preprocedural examination: Secondary | ICD-10-CM | POA: Insufficient documentation

## 2014-04-12 DIAGNOSIS — Z01812 Encounter for preprocedural laboratory examination: Secondary | ICD-10-CM | POA: Diagnosis not present

## 2014-04-12 HISTORY — DX: Unspecified osteoarthritis, unspecified site: M19.90

## 2014-04-12 LAB — TYPE AND SCREEN
ABO/RH(D): O POS
ANTIBODY SCREEN: NEGATIVE

## 2014-04-12 LAB — URINALYSIS, ROUTINE W REFLEX MICROSCOPIC
BILIRUBIN URINE: NEGATIVE
GLUCOSE, UA: NEGATIVE mg/dL
Hgb urine dipstick: NEGATIVE
Ketones, ur: NEGATIVE mg/dL
Leukocytes, UA: NEGATIVE
Nitrite: NEGATIVE
PH: 6 (ref 5.0–8.0)
Protein, ur: NEGATIVE mg/dL
SPECIFIC GRAVITY, URINE: 1.021 (ref 1.005–1.030)
Urobilinogen, UA: 1 mg/dL (ref 0.0–1.0)

## 2014-04-12 LAB — COMPREHENSIVE METABOLIC PANEL
ALK PHOS: 105 U/L (ref 39–117)
ALT: 31 U/L (ref 0–53)
AST: 26 U/L (ref 0–37)
Albumin: 3.9 g/dL (ref 3.5–5.2)
Anion gap: 14 (ref 5–15)
BILIRUBIN TOTAL: 0.3 mg/dL (ref 0.3–1.2)
BUN: 23 mg/dL (ref 6–23)
CHLORIDE: 100 meq/L (ref 96–112)
CO2: 30 mEq/L (ref 19–32)
Calcium: 9.1 mg/dL (ref 8.4–10.5)
Creatinine, Ser: 1.47 mg/dL — ABNORMAL HIGH (ref 0.50–1.35)
GFR, EST AFRICAN AMERICAN: 56 mL/min — AB (ref >90)
GFR, EST NON AFRICAN AMERICAN: 49 mL/min — AB (ref >90)
GLUCOSE: 171 mg/dL — AB (ref 70–99)
POTASSIUM: 4.4 meq/L (ref 3.7–5.3)
Sodium: 144 mEq/L (ref 137–147)
Total Protein: 7.8 g/dL (ref 6.0–8.3)

## 2014-04-12 LAB — ABO/RH: ABO/RH(D): O POS

## 2014-04-12 LAB — HEMOGLOBIN A1C
Hgb A1c MFr Bld: 7 % — ABNORMAL HIGH (ref <5.7)
Mean Plasma Glucose: 154 mg/dL — ABNORMAL HIGH (ref <117)

## 2014-04-13 ENCOUNTER — Encounter (HOSPITAL_COMMUNITY): Payer: Self-pay

## 2014-04-13 LAB — CBC WITH DIFFERENTIAL/PLATELET
Basophils Absolute: 0 10*3/uL (ref 0.0–0.1)
Basophils Relative: 0 % (ref 0–1)
EOS ABS: 0.3 10*3/uL (ref 0.0–0.7)
Eosinophils Relative: 3 % (ref 0–5)
HCT: 39.9 % (ref 39.0–52.0)
Hemoglobin: 13.5 g/dL (ref 13.0–17.0)
Lymphocytes Relative: 40 % (ref 12–46)
Lymphs Abs: 4.4 10*3/uL — ABNORMAL HIGH (ref 0.7–4.0)
MCH: 28.4 pg (ref 26.0–34.0)
MCHC: 33.8 g/dL (ref 30.0–36.0)
MCV: 84 fL (ref 78.0–100.0)
Monocytes Absolute: 1 10*3/uL (ref 0.1–1.0)
Monocytes Relative: 9 % (ref 3–12)
NEUTROS ABS: 5.3 10*3/uL (ref 1.7–7.7)
Neutrophils Relative %: 48 % (ref 43–77)
Platelets: 266 10*3/uL (ref 150–400)
RBC: 4.75 MIL/uL (ref 4.22–5.81)
RDW: 14 % (ref 11.5–15.5)
WBC: 11 10*3/uL — ABNORMAL HIGH (ref 4.0–10.5)

## 2014-04-13 NOTE — Progress Notes (Signed)
Anesthesia Chart Review:  Patient is a 63 year old male scheduled for laparoscopic assisted right colectomy, possible open on 04/16/14 by Dr. Dalbert Batman.  History includes sessile tubular adenoma of the right colon, HTN, chronic diastolic CHF, DM2, arthritis, HLD with statin intolerance, gout, appendectomy, question of OSA (no formal testing, uses night time O2 PRN), former smoker. BMI is consistent with obesity  Cardiologist is Dr. Domenic Polite who felt patient was at acceptable risk for surgery. PCP is Dr. Orson Ape.   EKG on 03/24/14 showed: SR, first degree AVB, incomplete right BBB, rightward axis, left posterior fascicular block.  Echo on 12/22/12 showed:  - Left ventricle: The cavity size was normal. Wall thickness was increased in a pattern of severe LVH. Systolic function was normal. The estimated ejection fraction was in the range of 60% to 65%. Features are consistent with a pseudonormal left ventricular filling pattern, with concomitant abnormal relaxation and increased filling pressure (grade 2 diastolic dysfunction). - Aorta: Aortic root is minimally dilated ate 40 mm. Ascending aorta is difficult to see. May be mildly dilated. Consider CT to evaluate further. - Left atrium: The atrium was mildly dilated.  CXR on 04/12/14 showed: No active cardiopulmonary disease.  Preoperative labs noted. BUN/Cr 23/1.47.  His last Cr on 03/02/14 was 1.15; however, his previous Cr in 01/2013 and 03/01/14 were primarily in the 1.3-1.35 range.  Glucose 171, mean plasma glucose of 154, A1C 7.0.  WBC 11.0.  H/H 13.5/39.9.  T&S done. Labs are marked as reviewed by Dr. Dalbert Batman. I think Dr. Dalbert Batman can follow patient's renal functions post-operatively.  If no acute changes then I would anticipate that he could proceed as planned.  George Hugh [facility] Short Stay Center/Anesthesiology Phone [phone] 04/13/2014 11:11 AM

## 2014-04-15 MED ORDER — CEFOTETAN DISODIUM 2 G IJ SOLR
2.0000 g | INTRAMUSCULAR | Status: AC
  Administered 2014-04-16: 2 g via INTRAVENOUS
  Filled 2014-04-15: qty 2

## 2014-04-15 MED ORDER — ALVIMOPAN 12 MG PO CAPS
12.0000 mg | ORAL_CAPSULE | Freq: Once | ORAL | Status: AC
  Administered 2014-04-16: 12 mg via ORAL
  Filled 2014-04-15 (×2): qty 1

## 2014-04-15 NOTE — H&P (Signed)
Benjamin [name]   MRN:  [id number]   Description: 63 year old male  Provider: [name], MD  Department: [facility]         Diagnoses      Personal history of colonic polyps    -  Primary      V12.72            Current Vitals Most recent update: 03/04/2014 11:22 AM by Jacinto Reap, CMA      BP Pulse Temp(Src) Resp Ht Wt      118/70 56 98.3 F (36.8 C) 14 6\' 1"  (1.854 m) 252 lb 12.8 oz (114.669 kg)      BMI 33.36 kg/m2                  History and Physical      [name], MD      Status: Addendum            Patient ID: Benjamin [name], male   DOB: Dec 20, 1950, 63 y.o.   MRN: [id number]    Note:  This dictation was prepared with Dragon/digital dictation along with [facility] technology. Any transcriptional errors that result from this process are unintentional.           HPI Benjamin [name] is a 63 y.o. male.  He is referred by Dr. Milton Ferguson in [place]  for evaluation and surgical management of a sessile adenomatous polyp of the right colon which cannot be removed colonoscopically. Dr. Johnny Bridge is his cardiologist. Dr. Orson Ape is his PCP.    This patient does not have any GI or abdominal symptoms. No abnormal pain. Alteration of bowel habits. No blood in the stool. No weight loss. No anemia. He went for his first screening colonoscopy in February of this year and Dr. Sanda Klein found a sessile tubular adenoma which he shaved off and completely. Pathology showed no malignancy or atypia he returned for followup colonoscopy on 02/22/2014 and underwent another incomplete debulking of the tumor. The tumor was described as being cooperative like polyp 7-8 cm distal to the ileocecal valve. Dimensions of the polyp were 2 x 3 cm. This was extensively tattooed. He had to go back and have a bleeder clipped but then has done very well. He was referred for elective right colectomy because of a precancerous neoplastic polyp of the right colon. The distal colon seems  fine.   Comorbidities include management of congestive heart failure by cardiology. The patient does not have any chest pain and states that he can climb 3-4 flights of stairs but eventually gets a little short of breath. Hypertension. Type 2 diabetes. Hyperlipidemia. Gout. Sleep apnea. Status post open appendectomy. GERD well controlled with proton pump inhibitors.   Social history reveals that he had to retire following his treatment for congestive heart failure. 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        Past Medical History   Diagnosis  Date   .  Essential hypertension, benign     .  Type 2 diabetes mellitus     .  Mixed hyperlipidemia         Statin intolerance   .  Gout     .  Sleep apnea         questionable. uses oxygen at bedtime at times. no formal sleep study.   .  Rotator cuff tear         chronic pain   .  Tubular  adenoma           Past Surgical History   Procedure  Laterality  Date   .  Appendectomy       .  Colonoscopy  N/A  10/29/2013       Dr. [name]- sigmoid polyp status post cold snare removal. large sessile ascending colon polyp. debulked with saline assisted snare polypectomy . pancolonic diverticulosis. inadequate prep. tubular adenoma on bx   .  Colonoscopy  N/A  02/22/2014       Dr. [name]: Saline-assisted snare polypectomy/biopsy for sprawling carpet polyp in the ascending colon which could not be completely removed. Status post biopsy (tubular adenoma), tattooed   .  Esophagogastroduodenoscopy  N/A  02/22/2014       Dr. [name]: Erosive reflux esophagitis. Schatzki ring status post dilation. Gastric and duodenal erosions with benign biopsies.   Azzie Almas dilation  N/A  02/22/2014       Procedure: Azzie Almas DILATION;  Surgeon: [name], MD;  [facility];  Service: Endoscopy;  Laterality: N/A;   [name] dilation  N/A  02/22/2014       Procedure: [name] DILATION;  Surgeon: [name], MD;  Location: [facility];   Service: Endoscopy;  Laterality: N/A;   .  Colonoscopy  N/A  03/01/2014       Procedure: COLONOSCOPY;  Surgeon: [name], MD;  Location: [facility];  Service: Endoscopy;  Laterality: N/A;         Family History   Problem  Relation  Age of Onset   .  Diabetes type II  Mother     .  Colon cancer  Neg Hx          Social History History   Substance Use Topics   .  Smoking status:  Former Smoker       Types:  Cigarettes   .  Smokeless tobacco:  Never Used         Comment: quit 35 years ago   .  Alcohol Use:  No         Comment: None in over a year. prior to that 1-2 beers couple times per week.         Allergies   Allergen  Reactions   .  Niacin-Lovastatin Er  Other (See Comments)       Body aches   .  Zocor [Simvastatin - High Dose]  Other (See Comments)       Body aches.         Current Outpatient Prescriptions   Medication  Sig  Dispense  Refill   .  allopurinol (ZYLOPRIM) 100 MG tablet  Take 100 mg by mouth daily.         [name]  amLODipine (NORVASC) 10 MG tablet  Take 1 tablet (10 mg total) by mouth daily.   30 tablet   6   .  CINNAMON PO  Take 1 tablet by mouth daily.         .  diazepam (VALIUM) 10 MG tablet  Take 10 mg by mouth every 6 (six) hours as needed for anxiety.         [name]  glimepiride (AMARYL) 4 MG tablet  Take 4 mg by mouth 2 (two) times daily.         .  hydrochlorothiazide (HYDRODIURIL) 25 MG tablet  Take 1 tablet (25 mg total) by mouth daily.   30 tablet   6   .  HYDROcodone-acetaminophen (NORCO) 10-325 MG per tablet  Take 1 tablet by mouth every 6 (six) hours as needed for moderate pain.          [name]  KLOR-CON M20 20 MEQ tablet  TAKE TWO TABLETS BY MOUTH ONCE DAILY   60 tablet   6   .  losartan (COZAAR) 100 MG tablet  TAKE ONE TABLET BY MOUTH ONCE DAILY   30 tablet   6   .  metFORMIN (GLUCOPHAGE) 500 MG tablet  Take 500 mg by mouth 2 (two) times daily with a meal.          .  metoprolol (LOPRESSOR) 100 MG tablet  Take 1 tablet (100 mg total) by  mouth 2 (two) times daily.   60 tablet   6   .  NIACIN PO  Take 1 tablet by mouth daily.         .  Omega-3 Fatty Acids (FISH OIL PO)  Take 1 capsule by mouth daily.         .  pantoprazole (PROTONIX) 40 MG tablet  Take 1 tablet by mouth daily.         .  polyethylene glycol powder (MIRALAX) powder  Take 255 g (1 Container total) by mouth once.   527 g   0   .  potassium chloride SA (K-DUR,KLOR-CON) 20 MEQ tablet  Take 20 mEq by mouth daily.         .  Red Yeast Rice Extract (RED YEAST RICE PO)  Take 1 tablet by mouth daily.              Review of Systems  Constitutional: Negative for fever, chills and unexpected weight change.  HENT: Negative for congestion, hearing loss, sore throat, trouble swallowing and voice change.   Eyes: Negative for visual disturbance.  Respiratory: Positive for shortness of breath. Negative for cough and wheezing.   Cardiovascular: Negative for chest pain, palpitations and leg swelling.  Gastrointestinal: Negative for nausea, vomiting, abdominal pain, diarrhea, constipation, blood in stool, abdominal distention, anal bleeding and rectal pain.  Genitourinary: Negative for hematuria and difficulty urinating.  Musculoskeletal: Negative for arthralgias.  Skin: Negative for rash and wound.  Neurological: Negative for seizures, syncope, weakness and headaches.  Hematological: Negative for adenopathy. Does not bruise/bleed easily.  Psychiatric/Behavioral: Negative for confusion.      Blood pressure 118/70, pulse 56, temperature 98.3 F (36.8 C), resp. rate 14, height 6\' 1"  (1.854 m), weight 252 lb 12.8 oz (114.669 kg).   Physical Exam  Constitutional: He is oriented to person, place, and time. He appears well-developed and well-nourished. No distress.  BMI 35.  HENT:   Head: Normocephalic.   Nose: Nose normal.   Mouth/Throat: No oropharyngeal exudate.  Eyes: Conjunctivae and EOM are normal. Pupils are equal, round, and reactive to light. Right eye  exhibits no discharge. Left eye exhibits no discharge. No scleral icterus.  Neck: Normal range of motion. Neck supple. No JVD present. No tracheal deviation present. No thyromegaly present.  Cardiovascular: Normal rate, regular rhythm, normal heart sounds and intact distal pulses.    No murmur heard. Pulmonary/Chest: Effort normal and breath sounds normal. No stridor. No respiratory distress. He has no wheezes. He has no rales. He exhibits no tenderness.  Abdominal: Soft. Bowel sounds are normal. He exhibits no distension and no mass. There is no tenderness. There is no rebound and no guarding.  Right lower quadrant scar from appendectomy, well-healed  Musculoskeletal: Normal range of  motion. He exhibits no edema and no tenderness.  Lymphadenopathy:    He has no cervical adenopathy.  Neurological: He is alert and oriented to person, place, and time. He has normal reflexes. Coordination normal.  Skin: Skin is warm and dry. No rash noted. He is not diaphoretic. No erythema. No pallor.  Psychiatric: He has a normal mood and affect. His behavior is normal. Judgment and thought content normal.      Data Reviewed I have reviewed both colonoscopy report, both pathology reports, available office notes.   Assessment    Sessile tubular adenoma of the ascending colon, incompletely restricted despite 2 attempts. Agree with recommendation for elective right colectomy   History congestive heart failure, minimally symptomatic. Cardiology followup scheduled for July 8 routinely.   History of open appendectomy   GERD Hypertension Type 2 diabetes Gout Sleep apnea   BMI 35     Plan    I  had a long discussion with the patient and his wife.[name] He understands and would like to proceed with surgery as soon as possible. He knows this is not an emergency, but is still anxious,  We will try to accomodate him.   We'll schedule for laparoscopic-assisted right colectomy, possible open colectomy in  the near future. We'll do this at [facility] hospital   Will need preoperative cardiac risk assessment with Dr. Johnny Bridge. I told the  patient to call and see if appt. could   be moved up.   He will undergo a one-day bowel prep preop...explained.   Entereg protocol...explained   Continue beta blockers perioperatively           Benjamin [name]. Dalbert Batman, M.D., [facility] General and Minimally invasive Surgery Breast and Colorectal Surgery Office:   [phone] Pager:   [phone]

## 2014-04-16 ENCOUNTER — Encounter (HOSPITAL_COMMUNITY): Payer: BC Managed Care – PPO | Admitting: Vascular Surgery

## 2014-04-16 ENCOUNTER — Encounter (HOSPITAL_COMMUNITY): Payer: Self-pay | Admitting: *Deleted

## 2014-04-16 ENCOUNTER — Inpatient Hospital Stay (HOSPITAL_COMMUNITY)
Admission: RE | Admit: 2014-04-16 | Discharge: 2014-04-20 | DRG: 331 | Disposition: A | Discharge status: Home / Self Care | Payer: BC Managed Care – PPO | Source: Ambulatory Visit | Attending: General Surgery | Admitting: General Surgery

## 2014-04-16 ENCOUNTER — Inpatient Hospital Stay (HOSPITAL_COMMUNITY): Payer: BC Managed Care – PPO | Admitting: Certified Registered Nurse Anesthetist

## 2014-04-16 ENCOUNTER — Encounter (HOSPITAL_COMMUNITY)
Admission: RE | Disposition: A | Discharge status: Home / Self Care | Payer: Self-pay | Source: Ambulatory Visit | Attending: General Surgery

## 2014-04-16 DIAGNOSIS — M109 Gout, unspecified: Secondary | ICD-10-CM | POA: Diagnosis present

## 2014-04-16 DIAGNOSIS — E119 Type 2 diabetes mellitus without complications: Secondary | ICD-10-CM | POA: Diagnosis present

## 2014-04-16 DIAGNOSIS — G4730 Sleep apnea, unspecified: Secondary | ICD-10-CM | POA: Diagnosis present

## 2014-04-16 DIAGNOSIS — I10 Essential (primary) hypertension: Secondary | ICD-10-CM | POA: Diagnosis present

## 2014-04-16 DIAGNOSIS — D126 Benign neoplasm of colon, unspecified: Secondary | ICD-10-CM | POA: Diagnosis present

## 2014-04-16 DIAGNOSIS — E876 Hypokalemia: Secondary | ICD-10-CM | POA: Diagnosis not present

## 2014-04-16 DIAGNOSIS — K219 Gastro-esophageal reflux disease without esophagitis: Secondary | ICD-10-CM | POA: Diagnosis present

## 2014-04-16 DIAGNOSIS — K660 Peritoneal adhesions (postprocedural) (postinfection): Secondary | ICD-10-CM

## 2014-04-16 DIAGNOSIS — Z87891 Personal history of nicotine dependence: Secondary | ICD-10-CM | POA: Diagnosis not present

## 2014-04-16 DIAGNOSIS — E782 Mixed hyperlipidemia: Secondary | ICD-10-CM | POA: Diagnosis present

## 2014-04-16 DIAGNOSIS — I509 Heart failure, unspecified: Secondary | ICD-10-CM | POA: Diagnosis present

## 2014-04-16 DIAGNOSIS — Z6835 Body mass index (BMI) 35.0-35.9, adult: Secondary | ICD-10-CM | POA: Diagnosis not present

## 2014-04-16 HISTORY — PX: LAPAROSCOPIC RIGHT COLECTOMY: SHX5925

## 2014-04-16 LAB — CBC
HCT: 39 % (ref 39.0–52.0)
HEMOGLOBIN: 13.3 g/dL (ref 13.0–17.0)
MCH: 29.3 pg (ref 26.0–34.0)
MCHC: 34.1 g/dL (ref 30.0–36.0)
MCV: 85.9 fL (ref 78.0–100.0)
Platelets: 205 10*3/uL (ref 150–400)
RBC: 4.54 MIL/uL (ref 4.22–5.81)
RDW: 13.6 % (ref 11.5–15.5)
WBC: 26.9 10*3/uL — AB (ref 4.0–10.5)

## 2014-04-16 LAB — GLUCOSE, CAPILLARY
GLUCOSE-CAPILLARY: 159 mg/dL — AB (ref 70–99)
GLUCOSE-CAPILLARY: 211 mg/dL — AB (ref 70–99)
Glucose-Capillary: 210 mg/dL — ABNORMAL HIGH (ref 70–99)
Glucose-Capillary: 174 mg/dL — ABNORMAL HIGH (ref 70–99)

## 2014-04-16 LAB — BASIC METABOLIC PANEL
Anion gap: 15 (ref 5–15)
BUN: 14 mg/dL (ref 6–23)
CHLORIDE: 103 meq/L (ref 96–112)
CO2: 25 mEq/L (ref 19–32)
Calcium: 8.5 mg/dL (ref 8.4–10.5)
Creatinine, Ser: 1.39 mg/dL — ABNORMAL HIGH (ref 0.50–1.35)
GFR calc Af Amer: 60 mL/min — ABNORMAL LOW (ref >90)
GFR, EST NON AFRICAN AMERICAN: 52 mL/min — AB (ref >90)
GLUCOSE: 219 mg/dL — AB (ref 70–99)
POTASSIUM: 3.8 meq/L (ref 3.7–5.3)
Sodium: 143 mEq/L (ref 137–147)

## 2014-04-16 SURGERY — COLECTOMY, RIGHT, LAPAROSCOPIC
Anesthesia: General | Site: Abdomen

## 2014-04-16 MED ORDER — SODIUM CHLORIDE 0.9 % IR SOLN
Status: DC | PRN
  Administered 2014-04-16: 1000 mL

## 2014-04-16 MED ORDER — PANTOPRAZOLE SODIUM 40 MG PO TBEC
40.0000 mg | DELAYED_RELEASE_TABLET | Freq: Every day | ORAL | Status: DC
  Administered 2014-04-17 – 2014-04-19 (×3): 40 mg via ORAL
  Filled 2014-04-16 (×3): qty 1

## 2014-04-16 MED ORDER — DEXTROSE 5 % IV SOLN
2.0000 g | Freq: Two times a day (BID) | INTRAVENOUS | Status: AC
  Administered 2014-04-16: 2 g via INTRAVENOUS
  Filled 2014-04-16: qty 2

## 2014-04-16 MED ORDER — EPHEDRINE SULFATE 50 MG/ML IJ SOLN
INTRAMUSCULAR | Status: DC | PRN
  Administered 2014-04-16: 10 mg via INTRAVENOUS

## 2014-04-16 MED ORDER — INSULIN ASPART 100 UNIT/ML ~~LOC~~ SOLN
SUBCUTANEOUS | Status: AC
Start: 2014-04-16 — End: 2014-04-17
  Administered 2014-04-17: 3 [IU] via SUBCUTANEOUS
  Filled 2014-04-16: qty 7

## 2014-04-16 MED ORDER — ONDANSETRON HCL 4 MG/2ML IJ SOLN: 4.0000 mg | Freq: Four times a day (QID) | INTRAMUSCULAR | Status: DC | PRN

## 2014-04-16 MED ORDER — METOPROLOL TARTRATE 100 MG PO TABS
100.0000 mg | ORAL_TABLET | Freq: Two times a day (BID) | ORAL | Status: DC
  Administered 2014-04-16 – 2014-04-19 (×7): 100 mg via ORAL
  Filled 2014-04-16 (×10): qty 1

## 2014-04-16 MED ORDER — OXYCODONE-ACETAMINOPHEN 5-325 MG PO TABS
ORAL_TABLET | ORAL | Status: AC
  Filled 2014-04-16: qty 2

## 2014-04-16 MED ORDER — HYDROCHLOROTHIAZIDE 25 MG PO TABS
25.0000 mg | ORAL_TABLET | Freq: Every day | ORAL | Status: DC
  Administered 2014-04-17 – 2014-04-19 (×3): 25 mg via ORAL
  Filled 2014-04-16 (×4): qty 1

## 2014-04-16 MED ORDER — LIDOCAINE HCL (CARDIAC) 20 MG/ML IV SOLN
INTRAVENOUS | Status: DC | PRN
  Administered 2014-04-16: 80 mg via INTRAVENOUS

## 2014-04-16 MED ORDER — INSULIN ASPART 100 UNIT/ML ~~LOC~~ SOLN
0.0000 [IU] | SUBCUTANEOUS | Status: DC
  Administered 2014-04-16: 4 [IU] via SUBCUTANEOUS
  Administered 2014-04-16: 7 [IU] via SUBCUTANEOUS
  Administered 2014-04-17 (×4): 3 [IU] via SUBCUTANEOUS
  Administered 2014-04-17 – 2014-04-18 (×2): 4 [IU] via SUBCUTANEOUS
  Administered 2014-04-18: 3 [IU] via SUBCUTANEOUS
  Administered 2014-04-18 (×2): 4 [IU] via SUBCUTANEOUS

## 2014-04-16 MED ORDER — GLYCOPYRROLATE 0.2 MG/ML IJ SOLN
INTRAMUSCULAR | Status: DC | PRN
  Administered 2014-04-16: .8 mg via INTRAVENOUS
  Administered 2014-04-16: .1 mg via INTRAVENOUS

## 2014-04-16 MED ORDER — OXYCODONE HCL 5 MG PO TABS
5.0000 mg | ORAL_TABLET | Freq: Once | ORAL | Status: AC | PRN
  Administered 2014-04-16: 5 mg via ORAL

## 2014-04-16 MED ORDER — ACETAMINOPHEN 325 MG PO TABS: 325.0000 mg | ORAL_TABLET | ORAL | Status: DC | PRN

## 2014-04-16 MED ORDER — LACTATED RINGERS IV SOLN
INTRAVENOUS | Status: DC
  Administered 2014-04-16: 10:00:00 via INTRAVENOUS

## 2014-04-16 MED ORDER — MIDAZOLAM HCL 5 MG/5ML IJ SOLN
INTRAMUSCULAR | Status: DC | PRN
  Administered 2014-04-16: 2 mg via INTRAVENOUS

## 2014-04-16 MED ORDER — MIDAZOLAM HCL 2 MG/2ML IJ SOLN
INTRAMUSCULAR | Status: AC
  Filled 2014-04-16: qty 2

## 2014-04-16 MED ORDER — PROPOFOL 10 MG/ML IV BOLUS
INTRAVENOUS | Status: DC | PRN
  Administered 2014-04-16: 140 mg via INTRAVENOUS

## 2014-04-16 MED ORDER — GLYCOPYRROLATE 0.2 MG/ML IJ SOLN
INTRAMUSCULAR | Status: AC
  Filled 2014-04-16: qty 4

## 2014-04-16 MED ORDER — 0.9 % SODIUM CHLORIDE (POUR BTL) OPTIME
TOPICAL | Status: DC | PRN
  Administered 2014-04-16 (×3): 1000 mL

## 2014-04-16 MED ORDER — LIDOCAINE HCL (CARDIAC) 20 MG/ML IV SOLN
INTRAVENOUS | Status: AC
  Filled 2014-04-16: qty 5

## 2014-04-16 MED ORDER — METFORMIN HCL 500 MG PO TABS
500.0000 mg | ORAL_TABLET | Freq: Two times a day (BID) | ORAL | Status: DC
  Administered 2014-04-17: 500 mg via ORAL
  Filled 2014-04-16 (×3): qty 1

## 2014-04-16 MED ORDER — BUPIVACAINE-EPINEPHRINE 0.25% -1:200000 IJ SOLN
INTRAMUSCULAR | Status: DC | PRN
  Administered 2014-04-16: 20 mL

## 2014-04-16 MED ORDER — LOSARTAN POTASSIUM 50 MG PO TABS
100.0000 mg | ORAL_TABLET | Freq: Every day | ORAL | Status: DC
  Administered 2014-04-17 – 2014-04-19 (×3): 100 mg via ORAL
  Filled 2014-04-16 (×4): qty 2

## 2014-04-16 MED ORDER — HYDROMORPHONE HCL PF 1 MG/ML IJ SOLN
1.0000 mg | INTRAMUSCULAR | Status: DC | PRN
  Administered 2014-04-17 – 2014-04-19 (×14): 1 mg via INTRAVENOUS
  Filled 2014-04-16 (×15): qty 1

## 2014-04-16 MED ORDER — GLYCOPYRROLATE 0.2 MG/ML IJ SOLN
INTRAMUSCULAR | Status: AC
  Filled 2014-04-16: qty 1

## 2014-04-16 MED ORDER — PROPOFOL 10 MG/ML IV BOLUS
INTRAVENOUS | Status: AC
  Filled 2014-04-16: qty 20

## 2014-04-16 MED ORDER — NEOSTIGMINE METHYLSULFATE 10 MG/10ML IV SOLN
INTRAVENOUS | Status: AC
  Filled 2014-04-16: qty 1

## 2014-04-16 MED ORDER — OXYCODONE HCL 5 MG PO TABS
ORAL_TABLET | ORAL | Status: AC
  Filled 2014-04-16: qty 1

## 2014-04-16 MED ORDER — CETYLPYRIDINIUM CHLORIDE 0.05 % MT LIQD
7.0000 mL | Freq: Two times a day (BID) | OROMUCOSAL | Status: DC
  Administered 2014-04-16 – 2014-04-18 (×3): 7 mL via OROMUCOSAL

## 2014-04-16 MED ORDER — LACTATED RINGERS IV SOLN
INTRAVENOUS | Status: DC | PRN
  Administered 2014-04-16 (×3): via INTRAVENOUS

## 2014-04-16 MED ORDER — ENOXAPARIN SODIUM 30 MG/0.3ML ~~LOC~~ SOLN
30.0000 mg | SUBCUTANEOUS | Status: DC
  Administered 2014-04-17 – 2014-04-19 (×3): 30 mg via SUBCUTANEOUS
  Filled 2014-04-16 (×4): qty 0.3

## 2014-04-16 MED ORDER — PHENYLEPHRINE HCL 10 MG/ML IJ SOLN
10.0000 mg | INTRAVENOUS | Status: DC | PRN
  Administered 2014-04-16: 15 ug/min via INTRAVENOUS

## 2014-04-16 MED ORDER — ARTIFICIAL TEARS OP OINT
TOPICAL_OINTMENT | OPHTHALMIC | Status: DC | PRN
  Administered 2014-04-16: 1 via OPHTHALMIC

## 2014-04-16 MED ORDER — POTASSIUM CHLORIDE IN NACL 20-0.9 MEQ/L-% IV SOLN
INTRAVENOUS | Status: DC
  Administered 2014-04-16 – 2014-04-18 (×3): via INTRAVENOUS
  Filled 2014-04-16 (×8): qty 1000

## 2014-04-16 MED ORDER — ALLOPURINOL 100 MG PO TABS
100.0000 mg | ORAL_TABLET | Freq: Every day | ORAL | Status: DC
  Administered 2014-04-17 – 2014-04-19 (×3): 100 mg via ORAL
  Filled 2014-04-16 (×4): qty 1

## 2014-04-16 MED ORDER — HYDROMORPHONE HCL PF 1 MG/ML IJ SOLN
INTRAMUSCULAR | Status: AC
  Filled 2014-04-16: qty 1

## 2014-04-16 MED ORDER — ONDANSETRON HCL 4 MG PO TABS: 4.0000 mg | ORAL_TABLET | Freq: Four times a day (QID) | ORAL | Status: DC | PRN

## 2014-04-16 MED ORDER — ROCURONIUM BROMIDE 50 MG/5ML IV SOLN
INTRAVENOUS | Status: AC
Start: 2014-04-16 — End: 2014-04-16
  Filled 2014-04-16: qty 1

## 2014-04-16 MED ORDER — NEOSTIGMINE METHYLSULFATE 10 MG/10ML IV SOLN
INTRAVENOUS | Status: DC | PRN
  Administered 2014-04-16: 5 mg via INTRAVENOUS

## 2014-04-16 MED ORDER — HYDROMORPHONE HCL PF 1 MG/ML IJ SOLN
INTRAMUSCULAR | Status: AC
  Administered 2014-04-17: 1 mg via INTRAVENOUS
  Filled 2014-04-16: qty 2

## 2014-04-16 MED ORDER — ACETAMINOPHEN 160 MG/5ML PO SOLN
325.0000 mg | ORAL | Status: DC | PRN
  Filled 2014-04-16: qty 20.3

## 2014-04-16 MED ORDER — HYDROMORPHONE HCL PF 1 MG/ML IJ SOLN
0.2500 mg | INTRAMUSCULAR | Status: AC | PRN
Start: 2014-04-16 — End: 2014-04-16
  Administered 2014-04-16 (×8): 0.5 mg via INTRAVENOUS

## 2014-04-16 MED ORDER — EPHEDRINE SULFATE 50 MG/ML IJ SOLN
INTRAMUSCULAR | Status: AC
  Filled 2014-04-16: qty 1

## 2014-04-16 MED ORDER — ALVIMOPAN 12 MG PO CAPS
12.0000 mg | ORAL_CAPSULE | Freq: Two times a day (BID) | ORAL | Status: DC
  Administered 2014-04-17 – 2014-04-18 (×4): 12 mg via ORAL
  Filled 2014-04-16 (×6): qty 1

## 2014-04-16 MED ORDER — OXYCODONE-ACETAMINOPHEN 5-325 MG PO TABS
1.0000 | ORAL_TABLET | ORAL | Status: DC | PRN
  Administered 2014-04-16 – 2014-04-19 (×6): 2 via ORAL
  Filled 2014-04-16 (×6): qty 2

## 2014-04-16 MED ORDER — OXYCODONE HCL 5 MG/5ML PO SOLN: 5.0000 mg | Freq: Once | ORAL | Status: AC | PRN

## 2014-04-16 MED ORDER — GLIMEPIRIDE 4 MG PO TABS
4.0000 mg | ORAL_TABLET | Freq: Two times a day (BID) | ORAL | Status: DC
  Administered 2014-04-16 – 2014-04-19 (×7): 4 mg via ORAL
  Filled 2014-04-16 (×9): qty 1

## 2014-04-16 MED ORDER — FENTANYL CITRATE 0.05 MG/ML IJ SOLN
INTRAMUSCULAR | Status: AC
  Filled 2014-04-16: qty 5

## 2014-04-16 MED ORDER — FENTANYL CITRATE 0.05 MG/ML IJ SOLN
INTRAMUSCULAR | Status: DC | PRN
  Administered 2014-04-16 (×2): 50 ug via INTRAVENOUS
  Administered 2014-04-16: 100 ug via INTRAVENOUS
  Administered 2014-04-16 (×3): 50 ug via INTRAVENOUS
  Administered 2014-04-16: 100 ug via INTRAVENOUS

## 2014-04-16 MED ORDER — ONDANSETRON HCL 4 MG/2ML IJ SOLN
INTRAMUSCULAR | Status: AC
  Filled 2014-04-16: qty 2

## 2014-04-16 MED ORDER — AMLODIPINE BESYLATE 10 MG PO TABS
10.0000 mg | ORAL_TABLET | Freq: Every day | ORAL | Status: DC
  Administered 2014-04-17 – 2014-04-19 (×3): 10 mg via ORAL
  Filled 2014-04-16 (×4): qty 1

## 2014-04-16 MED ORDER — ROCURONIUM BROMIDE 100 MG/10ML IV SOLN
INTRAVENOUS | Status: DC | PRN
  Administered 2014-04-16: 20 mg via INTRAVENOUS
  Administered 2014-04-16: 5 mg via INTRAVENOUS
  Administered 2014-04-16 (×2): 10 mg via INTRAVENOUS
  Administered 2014-04-16: 50 mg via INTRAVENOUS

## 2014-04-16 SURGICAL SUPPLY — 70 items
APPLIER CLIP ROT 10 11.4 M/L (STAPLE)
BLADE SURG ROTATE 9660 (MISCELLANEOUS) ×3 IMPLANT
CANISTER SUCTION 2500CC (MISCELLANEOUS) ×3 IMPLANT
CELLS DAT CNTRL 66122 CELL SVR (MISCELLANEOUS) ×1 IMPLANT
CHLORAPREP W/TINT 26ML (MISCELLANEOUS) ×3 IMPLANT
CLIP APPLIE ROT 10 11.4 M/L (STAPLE) IMPLANT
COVER MAYO STAND STRL (DRAPES) ×3 IMPLANT
COVER SURGICAL LIGHT HANDLE (MISCELLANEOUS) ×6 IMPLANT
DRAPE PROXIMA HALF (DRAPES) ×3 IMPLANT
DRAPE UTILITY 15X26 W/TAPE STR (DRAPE) ×15 IMPLANT
DRAPE WARM FLUID 44X44 (DRAPE) ×3 IMPLANT
DRSG OPSITE POSTOP 4X10 (GAUZE/BANDAGES/DRESSINGS) IMPLANT
DRSG OPSITE POSTOP 4X8 (GAUZE/BANDAGES/DRESSINGS) ×3 IMPLANT
DRSG TEGADERM 2-3/8X2-3/4 SM (GAUZE/BANDAGES/DRESSINGS) ×9 IMPLANT
DRSG TELFA 3X8 NADH (GAUZE/BANDAGES/DRESSINGS) ×3 IMPLANT
ELECT BLADE 6.5 EXT (BLADE) IMPLANT
ELECT CAUTERY BLADE 6.4 (BLADE) ×6 IMPLANT
ELECT REM PT RETURN 9FT ADLT (ELECTROSURGICAL) ×3
ELECTRODE REM PT RTRN 9FT ADLT (ELECTROSURGICAL) ×1 IMPLANT
GEL ULTRASOUND 20GR AQUASONIC (MISCELLANEOUS) IMPLANT
GLOVE BIO SURGEON STRL SZ7.5 (GLOVE) ×6 IMPLANT
GLOVE BIOGEL PI IND STRL 6.5 (GLOVE) ×3 IMPLANT
GLOVE BIOGEL PI IND STRL 7.0 (GLOVE) ×2 IMPLANT
GLOVE BIOGEL PI INDICATOR 6.5 (GLOVE) ×6
GLOVE BIOGEL PI INDICATOR 7.0 (GLOVE) ×4
GLOVE ECLIPSE 6.5 STRL STRAW (GLOVE) ×6 IMPLANT
GLOVE EUDERMIC 7 POWDERFREE (GLOVE) ×6 IMPLANT
GLOVE SS BIOGEL STRL SZ 6.5 (GLOVE) ×2 IMPLANT
GLOVE SUPERSENSE BIOGEL SZ 6.5 (GLOVE) ×4
GOWN STRL REUS W/ TWL LRG LVL3 (GOWN DISPOSABLE) ×6 IMPLANT
GOWN STRL REUS W/ TWL XL LVL3 (GOWN DISPOSABLE) ×2 IMPLANT
GOWN STRL REUS W/TWL LRG LVL3 (GOWN DISPOSABLE) ×12
GOWN STRL REUS W/TWL XL LVL3 (GOWN DISPOSABLE) ×4
KIT BASIN OR (CUSTOM PROCEDURE TRAY) ×3 IMPLANT
LIGASURE IMPACT 36 18CM CVD LR (INSTRUMENTS) ×3 IMPLANT
NS IRRIG 1000ML POUR BTL (IV SOLUTION) ×6 IMPLANT
PAD ARMBOARD 7.5X6 YLW CONV (MISCELLANEOUS) ×6 IMPLANT
PENCIL BUTTON HOLSTER BLD 10FT (ELECTRODE) ×6 IMPLANT
RELOAD PROXIMATE 75MM BLUE (ENDOMECHANICALS) ×6 IMPLANT
RTRCTR WOUND ALEXIS 18CM MED (MISCELLANEOUS) ×3
SCALPEL HARMONIC ACE (MISCELLANEOUS) ×3 IMPLANT
SCISSORS LAP 5X35 DISP (ENDOMECHANICALS) ×3 IMPLANT
SET IRRIG TUBING LAPAROSCOPIC (IRRIGATION / IRRIGATOR) ×3 IMPLANT
SLEEVE ENDOPATH XCEL 5M (ENDOMECHANICALS) ×6 IMPLANT
SPECIMEN JAR LARGE (MISCELLANEOUS) ×3 IMPLANT
SPONGE LAP 18X18 X RAY DECT (DISPOSABLE) ×3 IMPLANT
STAPLER GUN LINEAR PROX 60 (STAPLE) ×3 IMPLANT
STAPLER PROXIMATE 75MM BLUE (STAPLE) ×3 IMPLANT
STAPLER VISISTAT 35W (STAPLE) ×3 IMPLANT
SUCTION POOLE TIP (SUCTIONS) ×3 IMPLANT
SUT PDS AB 1 TP1 96 (SUTURE) ×6 IMPLANT
SUT SILK 2 0 SH CR/8 (SUTURE) ×3 IMPLANT
SUT SILK 2 0 TIES 10X30 (SUTURE) ×6 IMPLANT
SUT SILK 3 0 SH CR/8 (SUTURE) ×6 IMPLANT
SUT SILK 3 0 TIES 10X30 (SUTURE) ×3 IMPLANT
SYR BULB IRRIGATION 50ML (SYRINGE) ×6 IMPLANT
SYS LAPSCP GELPORT 120MM (MISCELLANEOUS)
SYSTEM LAPSCP GELPORT 120MM (MISCELLANEOUS) IMPLANT
TOWEL OR 17X26 10 PK STRL BLUE (TOWEL DISPOSABLE) ×6 IMPLANT
TOWEL OR NON WOVEN STRL DISP B (DISPOSABLE) ×3 IMPLANT
TRAY FOLEY CATH 16FRSI W/METER (SET/KITS/TRAYS/PACK) ×3 IMPLANT
TRAY LAPAROSCOPIC (CUSTOM PROCEDURE TRAY) ×3 IMPLANT
TROCAR XCEL 12X100 BLDLESS (ENDOMECHANICALS) IMPLANT
TROCAR XCEL BLUNT TIP 100MML (ENDOMECHANICALS) ×3 IMPLANT
TROCAR XCEL NON-BLD 11X100MML (ENDOMECHANICALS) IMPLANT
TROCAR XCEL NON-BLD 5MMX100MML (ENDOMECHANICALS) ×3 IMPLANT
TUBE CONNECTING 12'X1/4 (SUCTIONS) ×2
TUBE CONNECTING 12X1/4 (SUCTIONS) ×4 IMPLANT
TUBING FILTER THERMOFLATOR (ELECTROSURGICAL) IMPLANT
YANKAUER SUCT BULB TIP NO VENT (SUCTIONS) ×6 IMPLANT

## 2014-04-16 NOTE — Progress Notes (Signed)
Patient brought up from PACU at 1746pm. Received report from Amparo Bristol, RN. Patient stable lying in bed asleep.

## 2014-04-16 NOTE — Anesthesia Preprocedure Evaluation (Signed)
Anesthesia Evaluation  Patient identified by MRN, date of birth, ID band Patient awake    Reviewed: Allergy & Precautions, H&P , NPO status , Patient's Chart, lab work & pertinent test results, reviewed documented beta blocker date and time   History of Anesthesia Complications Negative for: history of anesthetic complications  Airway Mallampati: IV TM Distance: <3 FB Neck ROM: Full    Dental  (+) Teeth Intact   Pulmonary sleep apnea , neg COPDformer smoker,  breath sounds clear to auscultation        Cardiovascular hypertension, Pt. on medications and Pt. on home beta blockers - angina+CHF - Past MI Rhythm:Regular     Neuro/Psych Anxiety Neck pain, no radiculopathy, no valium negative neurological ROS     GI/Hepatic Neg liver ROS, Colon ca   Endo/Other  diabetes, Type 2Morbid obesity  Renal/GU Renal InsufficiencyRenal disease     Musculoskeletal negative musculoskeletal ROS (+)   Abdominal   Peds  Hematology   Anesthesia Other Findings   Reproductive/Obstetrics                           Anesthesia Physical Anesthesia Plan  ASA: III  Anesthesia Plan: General   Post-op Pain Management:    Induction: Intravenous  Airway Management Planned: Oral ETT  Additional Equipment: None  Intra-op Plan:   Post-operative Plan: Extubation in OR  Informed Consent: I have reviewed the patients History and Physical, chart, labs and discussed the procedure including the risks, benefits and alternatives for the proposed anesthesia with the patient or authorized representative who has indicated his/her understanding and acceptance.   Dental advisory given  Plan Discussed with: CRNA and Surgeon  Anesthesia Plan Comments: (Possible epidural if full laparotomy)        Anesthesia Quick Evaluation

## 2014-04-16 NOTE — Anesthesia Procedure Notes (Signed)
Procedure Name: Intubation Performed by: Trixie Deis A Pre-anesthesia Checklist: Timeout performed, Patient identified, Emergency Drugs available, Suction available and Patient being monitored Patient Re-evaluated:Patient Re-evaluated prior to inductionOxygen Delivery Method: Circle system utilized Preoxygenation: Pre-oxygenation with 100% oxygen Intubation Type: IV induction Ventilation: Two handed mask ventilation required Grade View: Grade I Tube type: Oral Number of attempts: 1 Airway Equipment and Method: Video-laryngoscopy Placement Confirmation: ETT inserted through vocal cords under direct vision,  positive ETCO2,  CO2 detector and breath sounds checked- equal and bilateral Secured at: 22 cm Tube secured with: Tape Dental Injury: Teeth and Oropharynx as per pre-operative assessment  Difficulty Due To: Difficulty was anticipated

## 2014-04-16 NOTE — Transfer of Care (Signed)
Immediate Anesthesia Transfer of Care Note  Patient: Benjamin [name]  Procedure(s) Performed: Procedure(s): LAPAROSCOPIC ASSISTED RIGHT COLECTOMY (N/A)  Patient Location: PACU  Anesthesia Type:General  Level of Consciousness: awake, alert  and oriented  Airway & Oxygen Therapy: Patient Spontanous Breathing and Patient connected to nasal cannula oxygen  Post-op Assessment: Report given to PACU RN, Post -op Vital signs reviewed and stable and Patient moving all extremities  Post vital signs: Reviewed and stable  Complications: No apparent anesthesia complications

## 2014-04-16 NOTE — Interval H&P Note (Signed)
History and Physical Interval Note:  04/16/2014 10:57 AM  Benjamin [name]  has presented today for surgery, with the diagnosis of TUBULAR ADENOMA OF RIGHT COLON  The goals and the various methods of treatment have been discussed with the patient and family. After consideration of risks, benefits and other options for treatment, the patient has consented to  Procedure(s): LAPAROSCOPIC ASSISTED RIGHT COLECTOMY, POSSIBLE OPEN (N/A) as a surgical intervention .  The patient's history has been reviewed, patient examined today,  no change in status, stable for surgery.  I have reviewed the patient's chart and labs.  Questions were answered to the patient's satisfaction.     [name]

## 2014-04-16 NOTE — Op Note (Signed)
Patient Name:           Benjamin [name]   Date of Surgery:        04/16/2014  Note: This dictation was prepared with Dragon/digital dictation along with [facility] technology. Any transcriptional errors that result from this process are unintentional.    Pre op Diagnosis:      Sessile tubular adenoma of the ascending colon, incompletely restricted despite 2 attempts  Post op Diagnosis:    Same  Procedure:                 Laparoscopic-assisted right colectomy  Surgeon:                     Edsel Petrin. Dalbert Batman, M.D., FACS  Assistant:                      Autumn Messing, M.D.  Operative Indications:   Benjamin [name] is a 63 y.o. male. He is referred by Dr. Milton Ferguson in [place] for evaluation and surgical management of a sessile adenomatous polyp of the right colon which cannot be removed colonoscopically. Dr. Johnny Bridge is his cardiologist. Dr. Orson Ape is his PCP.  This patient does not have any GI or abdominal symptoms. No abnormal pain. Alteration of bowel habits. No blood in the stool. No weight loss. No anemia. He went for his first screening colonoscopy in February of this year and Dr. [name]  found a sessile tubular adenoma which he shaved off incompletely. Pathology showed Villous adenoma, but no malignancy or atypia.  He returned for followup colonoscopy on 02/22/2014 and underwent another incomplete debulking of the tumor. The tumor was described as being carpet- like polyp 7-8 cm distal to the ileocecal valve. Dimensions of the polyp were 2 x 3 cm. This was extensively tattooed. He was referred for elective right colectomy because of a precancerous neoplastic polyp of the right colon. The distal colon seems fine.  Comorbidities include management of congestive heart failure by cardiology. Well  Compensated.  Hypertension. Type 2 diabetes. Hyperlipidemia. Gout. Sleep apnea. Status post open appendectomy. GERD well controlled with proton pump inhibitors.  Social history reveals that he had to  retire following his treatment for congestive heart failure. 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   Operative Findings:       There were extensive adhesions, presumed secondary to his prior appendectomy. We resected about 5 inches of terminal ileum and we resected the ascending colon and hepatic flexure and some of the right transverse colon. We preserved the middle colic vessels. When we opened the specimen there was minimal polyp present. The tattoo was within the center of the specimen and we resected at least 8 or 10 cm distal to the tattoo. This was discussed with Dr. Donato Heinz in Pathology.  Procedure in Detail:          Following the induction of general endotracheal anesthesia, a Foley catheter was placed, a nasogastric tube was placed, the abdomen was prepped and draped in a sterile fashion. Intravenous antibiotics were given. Surgical time out was performed. 0.5% Marcaine with epinephrine was used as local infiltration anesthetic.      A short incision was made in the midline above the umbilicus. The fascia was incised in the midline and the abdominal cavity entered under direct vision. An 11 mm Hassan trocar was inserted and secured with the pursestring suture of 0 Vicryl. Pneumoperitoneum was created. Camera was inserted. A 5  mm trocar was placed in the lower midline, a 5 mm trocar in the left abdomen, and a 5 mm trocar in the left epigastrium. We had to spend at least one hour taking down all the adhesions which  was done mostly with sharp scissors but ultimately we were able to completely mobilize the terminal ileum, identify the ileocecal valve, and then began to mobilize the colon. We mobilized the cecum first by dividing its lateral peritoneal attachments. We carried the dissection up the right paracolic gutter. We took down the hepatic flexure. We mobilized this from lateral to medial until it went across the midline. We then divided the gastrocolic and subhepatic  omentum mobilizing the transverse colon and brought it down off of the duodenum. We had good hemostasis. We released the pneumoperitoneum. We made about an 8 cm incision in the midline partly above and partly below the umbilicus. The fascia was incised in the midline. A wound protector was placed. The terminal ileum and right colon were delivered through the wound. There was no palpable mass or adenopathy but we could clearly see the tattoo in the middle of the specimen. We transected the terminal ileum about 5 inches proximal to the ileocecal valve with the GIA stapling device. We transected the proximal transverse colon with a GIA stapling device. The mesentery was taken down conservatively  with the LigaSure device and some larger vessels were clamped divided and ligated with 2-0 silk ties.      I took the specimen to a side table and opened it up. There was minimal polyp. There were a couple of areas around the tattoo which were thickened. I discussed the case with Dr. Donato Heinz in pathology and sent the specimen fresh for pathologic exam.     Anastomosis was created between the terminal ileum and the transverse colon with the GIA stapling device. The common defect looked good. The colon was patent and bled easily on both sides. We closed the defect with a TA 60 stapling device. I placed a few 3-0 silk sutures at critical points to reinforce the staple line. The anastomosis was 2 fingers wide at least. It looked healthy and without any signs of defect.      At this point we changed our gowns,gloves and  instruments and reinforced the drapes. We copiously irrigated the subphrenic space, abdomen, and pelvis and there was minimal blood found in the abdomen. The colon and omentum were returned to their anatomic positions. The midline fascia was closed with a running suture of #1 double-stranded PDS. Pneumoperitoneum was created again and videocamera was inserted. We suctioned out some irrigation fluid from the  subhepatic, subphrenic and right paracolic gutter space. There did not seem to be any bleeding. The pneumoperitoneum was released. The trocars were removed. The wound was irrigated. The midline wounds and trocar sites were closed with skin staples. I placed some Telfa wicks in between the staples. Protocol, honeycomb bandage was placed. The patient tolerated the procedure well and was taken to PACU in stable condition. EBL 100 cc. Counts correct. Complications none.                  Edsel Petrin. Dalbert Batman, M.D., FACS General and Minimally Invasive Surgery Breast and Colorectal Surgery  04/16/2014 1:58 PM

## 2014-04-16 NOTE — Anesthesia Postprocedure Evaluation (Signed)
  Anesthesia Post-op Note  Patient: Benjamin [name]  Procedure(s) Performed: Procedure(s): LAPAROSCOPIC ASSISTED RIGHT COLECTOMY (N/A)  Patient Location: PACU  Anesthesia Type:General  Level of Consciousness: awake  Airway and Oxygen Therapy: Patient Spontanous Breathing and Patient connected to nasal cannula oxygen  Post-op Pain: mild  Post-op Assessment: Post-op Vital signs reviewed, Patient's Cardiovascular Status Stable, Respiratory Function Stable, Patent Airway, No signs of Nausea or vomiting and Pain level controlled  Post-op Vital Signs: Reviewed and stable  Last Vitals:  Filed Vitals:   04/16/14 1600  BP: 154/68  Pulse: 68  Temp:   Resp: 9    Complications: No apparent anesthesia complications

## 2014-04-17 LAB — BASIC METABOLIC PANEL
ANION GAP: 13 (ref 5–15)
BUN: 17 mg/dL (ref 6–23)
CHLORIDE: 103 meq/L (ref 96–112)
CO2: 26 mEq/L (ref 19–32)
CREATININE: 1.8 mg/dL — AB (ref 0.50–1.35)
Calcium: 8.2 mg/dL — ABNORMAL LOW (ref 8.4–10.5)
GFR calc non Af Amer: 38 mL/min — ABNORMAL LOW (ref >90)
GFR, EST AFRICAN AMERICAN: 44 mL/min — AB (ref >90)
Glucose, Bld: 111 mg/dL — ABNORMAL HIGH (ref 70–99)
POTASSIUM: 3.9 meq/L (ref 3.7–5.3)
Sodium: 142 mEq/L (ref 137–147)

## 2014-04-17 LAB — CBC
HCT: 36 % — ABNORMAL LOW (ref 39.0–52.0)
HEMOGLOBIN: 11.7 g/dL — AB (ref 13.0–17.0)
MCH: 28.5 pg (ref 26.0–34.0)
MCHC: 32.5 g/dL (ref 30.0–36.0)
MCV: 87.6 fL (ref 78.0–100.0)
PLATELETS: 186 10*3/uL (ref 150–400)
RBC: 4.11 MIL/uL — ABNORMAL LOW (ref 4.22–5.81)
RDW: 13.8 % (ref 11.5–15.5)
WBC: 16 10*3/uL — ABNORMAL HIGH (ref 4.0–10.5)

## 2014-04-17 LAB — GLUCOSE, CAPILLARY
GLUCOSE-CAPILLARY: 128 mg/dL — AB (ref 70–99)
Glucose-Capillary: 103 mg/dL — ABNORMAL HIGH (ref 70–99)
Glucose-Capillary: 124 mg/dL — ABNORMAL HIGH (ref 70–99)
Glucose-Capillary: 134 mg/dL — ABNORMAL HIGH (ref 70–99)
Glucose-Capillary: 137 mg/dL — ABNORMAL HIGH (ref 70–99)
Glucose-Capillary: 151 mg/dL — ABNORMAL HIGH (ref 70–99)

## 2014-04-17 MED ORDER — SODIUM CHLORIDE 0.9 % IV BOLUS (SEPSIS)
500.0000 mL | Freq: Once | INTRAVENOUS | Status: AC
  Administered 2014-04-17: 500 mL via INTRAVENOUS

## 2014-04-17 NOTE — Progress Notes (Signed)
3545 Patients output was 273ml via foley. Dr. Grandville Silos notified of decrease in output order received. 0529 58ml Normal Saline bolus started. Will continue to monitor.

## 2014-04-17 NOTE — Progress Notes (Signed)
1 Day Post-Op  Subjective: Pain controlled.  No n/v.  Had some low UOP overnight.  Got bolus.  Lots of pale yellow urine in foley now.    Objective: Vital signs in last 24 hours: Temp:  [97.9 F (36.6 C)-100.6 F (38.1 C)] 100.6 F (38.1 C) (08/01 0514) Pulse Rate:  [44-80] 80 (08/01 0514) Resp:  [9-18] 16 (08/01 0514) BP: (102-171)/(61-91) 155/84 mmHg (08/01 0514) SpO2:  [91 %-98 %] 93 % (08/01 0514) Last BM Date: 04/14/14  Intake/Output from previous day: 07/31 0701 - 08/01 0700 In: 3527.2 [I.V.:2977.2; IV Piggyback:550] Out: 2993 [ZJIRC:7893; Blood:100] Intake/Output this shift:    General appearance: cooperative and sleepy, answers questions appropriately.  just got pain meds. Resp: breathing comfortably GI: soft, non distended, approp tender.  dried blood on dressing.  Lab Results:   Recent Labs  04/16/14 1423 04/17/14 0351  WBC 26.9* 16.0*  HGB 13.3 11.7*  HCT 39.0 36.0*  PLT 205 186   BMET  Recent Labs  04/16/14 1423 04/17/14 0351  NA 143 142  K 3.8 3.9  CL 103 103  CO2 25 26  GLUCOSE 219* 111*  BUN 14 17  CREATININE 1.39* 1.80*  CALCIUM 8.5 8.2*   PT/INR No results found for this basename: LABPROT, INR,  in the last 72 hours ABG No results found for this basename: PHART, PCO2, PO2, HCO3,  in the last 72 hours  Studies/Results: No results found.  Anti-infectives: Anti-infectives   Start     Dose/Rate Route Frequency Ordered Stop   04/16/14 2300  cefoTEtan (CEFOTAN) 2 g in dextrose 5 % 50 mL IVPB     2 g 100 mL/hr over 30 Minutes Intravenous Every 12 hours 04/16/14 1804 04/16/14 2252   04/16/14 0600  cefoTEtan (CEFOTAN) 2 g in dextrose 5 % 50 mL IVPB     2 g 100 mL/hr over 30 Minutes Intravenous On call to O.R. 04/15/14 1256 04/16/14 1125      Assessment/Plan: s/p Procedure(s): LAPAROSCOPIC ASSISTED RIGHT COLECTOMY (N/A) Continue foley due to urinary output monitoring Mobilize. Allow clear liquids entereg Incentive spirometry   LOS: 1 day    [facility] 04/17/2014

## 2014-04-18 LAB — BASIC METABOLIC PANEL
ANION GAP: 11 (ref 5–15)
BUN: 10 mg/dL (ref 6–23)
CHLORIDE: 96 meq/L (ref 96–112)
CO2: 27 mEq/L (ref 19–32)
Calcium: 8.4 mg/dL (ref 8.4–10.5)
Creatinine, Ser: 1.15 mg/dL (ref 0.50–1.35)
GFR calc Af Amer: 76 mL/min — ABNORMAL LOW (ref >90)
GFR, EST NON AFRICAN AMERICAN: 65 mL/min — AB (ref >90)
Glucose, Bld: 193 mg/dL — ABNORMAL HIGH (ref 70–99)
POTASSIUM: 3.9 meq/L (ref 3.7–5.3)
Sodium: 134 mEq/L — ABNORMAL LOW (ref 137–147)

## 2014-04-18 LAB — CBC WITH DIFFERENTIAL/PLATELET
BASOS ABS: 0 10*3/uL (ref 0.0–0.1)
Basophils Relative: 0 % (ref 0–1)
EOS ABS: 0.3 10*3/uL (ref 0.0–0.7)
Eosinophils Relative: 2 % (ref 0–5)
HCT: 33.3 % — ABNORMAL LOW (ref 39.0–52.0)
HEMOGLOBIN: 10.8 g/dL — AB (ref 13.0–17.0)
Lymphocytes Relative: 23 % (ref 12–46)
Lymphs Abs: 3.2 10*3/uL (ref 0.7–4.0)
MCH: 29 pg (ref 26.0–34.0)
MCHC: 32.4 g/dL (ref 30.0–36.0)
MCV: 89.3 fL (ref 78.0–100.0)
MONOS PCT: 11 % (ref 3–12)
Monocytes Absolute: 1.6 10*3/uL — ABNORMAL HIGH (ref 0.1–1.0)
NEUTROS ABS: 9 10*3/uL — AB (ref 1.7–7.7)
Neutrophils Relative %: 64 % (ref 43–77)
PLATELETS: 158 10*3/uL (ref 150–400)
RBC: 3.73 MIL/uL — ABNORMAL LOW (ref 4.22–5.81)
RDW: 13.5 % (ref 11.5–15.5)
WBC: 14.1 10*3/uL — ABNORMAL HIGH (ref 4.0–10.5)

## 2014-04-18 LAB — GLUCOSE, CAPILLARY
GLUCOSE-CAPILLARY: 114 mg/dL — AB (ref 70–99)
GLUCOSE-CAPILLARY: 162 mg/dL — AB (ref 70–99)
GLUCOSE-CAPILLARY: 189 mg/dL — AB (ref 70–99)
Glucose-Capillary: 115 mg/dL — ABNORMAL HIGH (ref 70–99)
Glucose-Capillary: 140 mg/dL — ABNORMAL HIGH (ref 70–99)
Glucose-Capillary: 157 mg/dL — ABNORMAL HIGH (ref 70–99)

## 2014-04-18 MED ORDER — SIMETHICONE 80 MG PO CHEW
80.0000 mg | CHEWABLE_TABLET | Freq: Four times a day (QID) | ORAL | Status: DC | PRN
  Administered 2014-04-18: 80 mg via ORAL
  Filled 2014-04-18 (×2): qty 1

## 2014-04-18 MED ORDER — METFORMIN HCL 500 MG PO TABS
500.0000 mg | ORAL_TABLET | Freq: Two times a day (BID) | ORAL | Status: DC
  Administered 2014-04-18 – 2014-04-20 (×4): 500 mg via ORAL
  Filled 2014-04-18 (×5): qty 1

## 2014-04-18 NOTE — Progress Notes (Addendum)
Patient ID: Benjamin [name], male   DOB: November 26, 1950, 63 y.o.   MRN: [id number] 2 Days Post-Op  Subjective: Pain OK.  Belching a lot.  UOP improved.  Having "gas pain."  Wife states that right eye is slightly smaller than left eye.    Objective: Vital signs in last 24 hours: Temp:  [98.3 F (36.8 C)-99.8 F (37.7 C)] 99.8 F (37.7 C) (08/02 0625) Pulse Rate:  [61-73] 65 (08/02 0625) Resp:  [17-20] 19 (08/02 0625) BP: (117-183)/(64-82) 160/74 mmHg (08/02 0625) SpO2:  [94 %-97 %] 96 % (08/02 0625) Last BM Date: 04/14/14  Intake/Output from previous day: 08/01 0701 - 08/02 0700 In: -  Out: 3050 [Urine:3050] Intake/Output this shift: Total I/O In: -  Out: 750 [Urine:750]  General appearance: A&O x 3.  OOB. Neuro:  Sl droop of right eyelid.  EOMI, CN 2-12 grossly intact. Eye looks sl swollen.   Resp: breathing comfortably GI: soft, mildly distended, approp tender.  dried blood on dressing.  Lab Results:   Recent Labs  04/16/14 1423 04/17/14 0351  WBC 26.9* 16.0*  HGB 13.3 11.7*  HCT 39.0 36.0*  PLT 205 186   BMET  Recent Labs  04/16/14 1423 04/17/14 0351  NA 143 142  K 3.8 3.9  CL 103 103  CO2 25 26  GLUCOSE 219* 111*  BUN 14 17  CREATININE 1.39* 1.80*  CALCIUM 8.5 8.2*   PT/INR No results found for this basename: LABPROT, INR,  in the last 72 hours ABG No results found for this basename: PHART, PCO2, PO2, HCO3,  in the last 72 hours  Studies/Results: No results found.  Anti-infectives: Anti-infectives   Start     Dose/Rate Route Frequency Ordered Stop   04/16/14 2300  cefoTEtan (CEFOTAN) 2 g in dextrose 5 % 50 mL IVPB     2 g 100 mL/hr over 30 Minutes Intravenous Every 12 hours 04/16/14 1804 04/16/14 2252   04/16/14 0600  cefoTEtan (CEFOTAN) 2 g in dextrose 5 % 50 mL IVPB     2 g 100 mL/hr over 30 Minutes Intravenous On call to O.R. 04/15/14 1256 04/16/14 1125      Assessment/Plan: s/p Procedure(s): LAPAROSCOPIC ASSISTED RIGHT COLECTOMY (N/A) D/c  foley Mobilize. Clear liquids. entereg Incentive spirometry Recheck labs today and tomorrow to eval Cr, CBC, and lytes.   Ice to right eye.   No evidence of CVA.   LOS: 2 days    [facility] 04/18/2014

## 2014-04-19 ENCOUNTER — Encounter (HOSPITAL_COMMUNITY): Payer: Self-pay | Admitting: General Surgery

## 2014-04-19 DIAGNOSIS — E876 Hypokalemia: Secondary | ICD-10-CM

## 2014-04-19 DIAGNOSIS — E119 Type 2 diabetes mellitus without complications: Secondary | ICD-10-CM

## 2014-04-19 LAB — GLUCOSE, CAPILLARY
GLUCOSE-CAPILLARY: 131 mg/dL — AB (ref 70–99)
GLUCOSE-CAPILLARY: 150 mg/dL — AB (ref 70–99)
Glucose-Capillary: 120 mg/dL — ABNORMAL HIGH (ref 70–99)
Glucose-Capillary: 210 mg/dL — ABNORMAL HIGH (ref 70–99)
Glucose-Capillary: 97 mg/dL (ref 70–99)
Glucose-Capillary: 97 mg/dL (ref 70–99)

## 2014-04-19 LAB — BASIC METABOLIC PANEL
Anion gap: 12 (ref 5–15)
BUN: 8 mg/dL (ref 6–23)
CALCIUM: 9 mg/dL (ref 8.4–10.5)
CO2: 31 meq/L (ref 19–32)
CREATININE: 1.16 mg/dL (ref 0.50–1.35)
Chloride: 99 mEq/L (ref 96–112)
GFR, EST AFRICAN AMERICAN: 75 mL/min — AB (ref >90)
GFR, EST NON AFRICAN AMERICAN: 65 mL/min — AB (ref >90)
Glucose, Bld: 104 mg/dL — ABNORMAL HIGH (ref 70–99)
Potassium: 3.2 mEq/L — ABNORMAL LOW (ref 3.7–5.3)
Sodium: 142 mEq/L (ref 137–147)

## 2014-04-19 LAB — CBC
HCT: 33.1 % — ABNORMAL LOW (ref 39.0–52.0)
Hemoglobin: 10.9 g/dL — ABNORMAL LOW (ref 13.0–17.0)
MCH: 28.7 pg (ref 26.0–34.0)
MCHC: 32.9 g/dL (ref 30.0–36.0)
MCV: 87.1 fL (ref 78.0–100.0)
Platelets: 173 10*3/uL (ref 150–400)
RBC: 3.8 MIL/uL — ABNORMAL LOW (ref 4.22–5.81)
RDW: 13.3 % (ref 11.5–15.5)
WBC: 13.2 10*3/uL — AB (ref 4.0–10.5)

## 2014-04-19 MED ORDER — POTASSIUM CHLORIDE CRYS ER 20 MEQ PO TBCR
40.0000 meq | EXTENDED_RELEASE_TABLET | Freq: Three times a day (TID) | ORAL | Status: DC
  Administered 2014-04-19 (×3): 40 meq via ORAL
  Filled 2014-04-19 (×4): qty 2

## 2014-04-19 MED ORDER — INSULIN ASPART 100 UNIT/ML ~~LOC~~ SOLN
0.0000 [IU] | Freq: Three times a day (TID) | SUBCUTANEOUS | Status: DC
  Administered 2014-04-19: 7 [IU] via SUBCUTANEOUS
  Administered 2014-04-19 – 2014-04-20 (×2): 3 [IU] via SUBCUTANEOUS

## 2014-04-19 NOTE — Clinical Documentation Improvement (Signed)
PLEASE SPECIFY TYPE OF CHF: Possible Clinical Conditions? Chronic Systolic Congestive Heart Failure Chronic Diastolic Congestive Heart Failure Chronic Systolic & Diastolic Congestive Heart Failure Acute Systolic Congestive Heart Failure Acute Diastolic Congestive Heart Failure Acute Systolic & Diastolic Congestive Heart Failure Acute on Chronic Systolic Congestive Heart Failure Acute on Chronic Diastolic Congestive Heart Failure Acute on Chronic Systolic & Diastolic Congestive Heart Failure Other Condition Cannot Clinically Determine  Supporting Information: (As per notes)"Congestive heart failure. Well compensated on current medications"   Thank You, Alessandra Grout, RN, BSN, CCDS,Clinical Documentation Specialist:  [phone]  [phone]=[facility]- Health Information Management

## 2014-04-19 NOTE — Progress Notes (Addendum)
3 Days Post-Op  Subjective: Awake. Stable. Denies shortness of breath or chest pain.SpO2 93% on room air. Heart rate 61. BP 157/80. Afebrile. Diuresing a lot. IV fluids discontinued K. 3.2.  Glucose range 97-189. Tolerating full liquids without nausea. Had 2 loose stools. Passing some flatus. ambulating with Walker some Biggest complaint is incisional pain. Urge to sit up or to get back in bed.  Objective: Vital signs in last 24 hours: Temp:  [97.8 F (36.6 C)-99.2 F (37.3 C)] 98.4 F (36.9 C) (08/03 0611) Pulse Rate:  [61-72] 61 (08/03 0611) Resp:  [18-20] 18 (08/03 0611) BP: (133-157)/(59-80) 157/80 mmHg (08/03 0611) SpO2:  [88 %-97 %] 93 % (08/03 0611) Last BM Date: 04/18/14  Intake/Output from previous day: 08/02 0701 - 08/03 0700 In: -  Out: 8527 [Urine:7250] Intake/Output this shift: Total I/O In: -  Out: 3500 [Urine:3500]  General appearance: aalert. Cooperative. Mental status normal.  Mild distress from incisional pain. Resp: clear to auscultation bilaterally GI: abdomen is soft. Nondistended. Appropriate incisional tenderness. Active bowel sounds. Wounds looked clean. Extr: No ankle edema.  Lab Results:  Results for orders placed during the hospital encounter of 04/16/14 (from the past 24 hour(s))  GLUCOSE, CAPILLARY     Status: Abnormal   Collection Time    04/18/14  7:43 AM      Result Value Ref Range   Glucose-Capillary 114 (*) 70 - 99 mg/dL  BASIC METABOLIC PANEL     Status: Abnormal   Collection Time    04/18/14 10:27 AM      Result Value Ref Range   Sodium 134 (*) 137 - 147 mEq/L   Potassium 3.9  3.7 - 5.3 mEq/L   Chloride 96  96 - 112 mEq/L   CO2 27  19 - 32 mEq/L   Glucose, Bld 193 (*) 70 - 99 mg/dL   BUN 10  6 - 23 mg/dL   Creatinine, Ser 1.15  0.50 - 1.35 mg/dL   Calcium 8.4  8.4 - 10.5 mg/dL   GFR calc non Af Amer 65 (*) >90 mL/min   GFR calc Af Amer 76 (*) >90 mL/min   Anion gap 11  5 - 15  CBC WITH DIFFERENTIAL     Status: Abnormal    Collection Time    04/18/14 10:27 AM      Result Value Ref Range   WBC 14.1 (*) 4.0 - 10.5 K/uL   RBC 3.73 (*) 4.22 - 5.81 MIL/uL   Hemoglobin 10.8 (*) 13.0 - 17.0 g/dL   HCT 33.3 (*) 39.0 - 52.0 %   MCV 89.3  78.0 - 100.0 fL   MCH 29.0  26.0 - 34.0 pg   MCHC 32.4  30.0 - 36.0 g/dL   RDW 13.5  11.5 - 15.5 %   Platelets 158  150 - 400 K/uL   Neutrophils Relative % 64  43 - 77 %   Neutro Abs 9.0 (*) 1.7 - 7.7 K/uL   Lymphocytes Relative 23  12 - 46 %   Lymphs Abs 3.2  0.7 - 4.0 K/uL   Monocytes Relative 11  3 - 12 %   Monocytes Absolute 1.6 (*) 0.1 - 1.0 K/uL   Eosinophils Relative 2  0 - 5 %   Eosinophils Absolute 0.3  0.0 - 0.7 K/uL   Basophils Relative 0  0 - 1 %   Basophils Absolute 0.0  0.0 - 0.1 K/uL  GLUCOSE, CAPILLARY     Status: Abnormal   Collection  Time    04/18/14 11:44 AM      Result Value Ref Range   Glucose-Capillary 157 (*) 70 - 99 mg/dL  GLUCOSE, CAPILLARY     Status: Abnormal   Collection Time    04/18/14  3:35 PM      Result Value Ref Range   Glucose-Capillary 162 (*) 70 - 99 mg/dL  GLUCOSE, CAPILLARY     Status: Abnormal   Collection Time    04/18/14  8:26 PM      Result Value Ref Range   Glucose-Capillary 189 (*) 70 - 99 mg/dL   Comment 1 Notify RN     Comment 2 Documented in Chart    GLUCOSE, CAPILLARY     Status: None   Collection Time    04/19/14 12:04 AM      Result Value Ref Range   Glucose-Capillary 97  70 - 99 mg/dL   Comment 1 Notify RN     Comment 2 Documented in Chart    GLUCOSE, CAPILLARY     Status: None   Collection Time    04/19/14  4:07 AM      Result Value Ref Range   Glucose-Capillary 97  70 - 99 mg/dL   Comment 1 Notify RN     Comment 2 Documented in Chart    BASIC METABOLIC PANEL     Status: Abnormal   Collection Time    04/19/14  5:00 AM      Result Value Ref Range   Sodium 142  137 - 147 mEq/L   Potassium 3.2 (*) 3.7 - 5.3 mEq/L   Chloride 99  96 - 112 mEq/L   CO2 31  19 - 32 mEq/L   Glucose, Bld 104 (*) 70 - 99  mg/dL   BUN 8  6 - 23 mg/dL   Creatinine, Ser 1.16  0.50 - 1.35 mg/dL   Calcium 9.0  8.4 - 10.5 mg/dL   GFR calc non Af Amer 65 (*) >90 mL/min   GFR calc Af Amer 75 (*) >90 mL/min   Anion gap 12  5 - 15  CBC     Status: Abnormal   Collection Time    04/19/14  5:00 AM      Result Value Ref Range   WBC 13.2 (*) 4.0 - 10.5 K/uL   RBC 3.80 (*) 4.22 - 5.81 MIL/uL   Hemoglobin 10.9 (*) 13.0 - 17.0 g/dL   HCT 33.1 (*) 39.0 - 52.0 %   MCV 87.1  78.0 - 100.0 fL   MCH 28.7  26.0 - 34.0 pg   MCHC 32.9  30.0 - 36.0 g/dL   RDW 13.3  11.5 - 15.5 %   Platelets 173  150 - 400 K/uL     Studies/Results: No results found.  [name] allopurinol  100 mg Oral Daily  . alvimopan  12 mg Oral BID  . amLODipine  10 mg Oral Daily  . antiseptic oral rinse  7 mL Mouth Rinse BID  . enoxaparin (LOVENOX) injection  30 mg Subcutaneous Q24H  . glimepiride  4 mg Oral BID  . hydrochlorothiazide  25 mg Oral Daily  . insulin aspart  0-20 Units Subcutaneous TID WC  . losartan  100 mg Oral Daily  . metFORMIN  500 mg Oral BID WC  . metoprolol  100 mg Oral BID  . pantoprazole  40 mg Oral Daily  . potassium chloride  40 mEq Oral TID     Assessment/Plan: s/p Procedure(s): LAPAROSCOPIC  ASSISTED RIGHT COLECTOMY  POD #3. Laparoscopic-assisted right colectomy. Stable.Ileus resolving. Leave IV in for prn Dilaudid. Hope to switch to Percocet Advanced to [place] modified diet Continued ambulating with walker as much as possible Check pathology. Remove honeycomb bandage and Wicks today. Significant spontaneous diuresis, on HCTZ,   IV at [facility]  History of an appendectomy for ruptured appendicitis. Severe adhesions encountered at this surgery.  Hypokalemia. K-Dur prescribed. Check B-Met tomorrow  Type 2 diabetes. Continue oral medications. Change sliding scale to AC and HS.  Hypertension Gout BMI 35 GERD. Asymptomatic   @PROBHOSP @  LOS: 3 days    Benjamin [name] M 04/19/2014  . .prob

## 2014-04-20 LAB — BASIC METABOLIC PANEL
ANION GAP: 13 (ref 5–15)
BUN: 12 mg/dL (ref 6–23)
CHLORIDE: 101 meq/L (ref 96–112)
CO2: 28 mEq/L (ref 19–32)
Calcium: 9 mg/dL (ref 8.4–10.5)
Creatinine, Ser: 1.14 mg/dL (ref 0.50–1.35)
GFR calc Af Amer: 77 mL/min — ABNORMAL LOW (ref >90)
GFR calc non Af Amer: 66 mL/min — ABNORMAL LOW (ref >90)
Glucose, Bld: 111 mg/dL — ABNORMAL HIGH (ref 70–99)
Potassium: 3.6 mEq/L — ABNORMAL LOW (ref 3.7–5.3)
SODIUM: 142 meq/L (ref 137–147)

## 2014-04-20 LAB — GLUCOSE, CAPILLARY: Glucose-Capillary: 123 mg/dL — ABNORMAL HIGH (ref 70–99)

## 2014-04-20 MED ORDER — OXYCODONE-ACETAMINOPHEN 5-325 MG PO TABS: 1.0000 | ORAL_TABLET | ORAL | Status: DC | PRN

## 2014-04-20 NOTE — Progress Notes (Signed)
Discharge instructions and prescription for percocet given and explained to pt and pt's family member.  Both verbalize understanding of all orders/instructions.  Both deny any questions.  IV removed by NT.  Pt in stable condition for discharge.  Pt going to eat breakfast and then leave. Benjamin [name]

## 2014-04-20 NOTE — Discharge Summary (Signed)
Patient ID: Benjamin [name] [id number] 63 y.o. 09-Dec-1950  Admit date: 04/16/2014  Discharge date and time: 04/20/2014  Admitting Physician: [name]  Discharge Physician: [name]  Admission Diagnoses: TUBULAR ADENOMA OF RIGHT COLON  Discharge Diagnoses: Tubular adenoma of right colon History appendectomy with ruptured appendicitis with severe adhesions Postop hypokalemia, mild Type 2 diabetes Hypertension Gout GERD BMI 35  Operations: Procedure(s): LAPAROSCOPIC ASSISTED RIGHT COLECTOMY  Admission Condition: good  Discharged Condition: good  Indication for Admission: [name] is a 63 y.o. male. He is referred by Dr. Milton Ferguson in [place] for evaluation and surgical management of a sessile adenomatous polyp of the right colon which cannot be removed colonoscopically. Dr. Johnny Bridge is his cardiologist. Dr. Orson Ape is his PCP.  This patient does not have any GI or abdominal symptoms. No abnormal pain. Alteration of bowel habits. No blood in the stool. No weight loss. No anemia. He went for his first screening colonoscopy in February of this year and Dr. [name] found a sessile tubular adenoma which he shaved off incompletely. Pathology showed Villous adenoma, but no malignancy or atypia. He returned for followup colonoscopy on 02/22/2014 and underwent another incomplete debulking of the tumor. The tumor was described as being carpet- like polyp 7-8 cm distal to the ileocecal valve. Dimensions of the polyp were 2 x 3 cm. This was extensively tattooed. He was referred for elective right colectomy because of a precancerous neoplastic polyp of the right colon. The distal colon seems fine.  Comorbidities include management of congestive heart failure by cardiology. Well Compensated. Hypertension. Type 2 diabetes. Hyperlipidemia. Gout. Sleep apnea. Status post open appendectomy. GERD well controlled with proton pump inhibitors.  Family history is negative for colon  cancer. He underwent mechanical and antibiotic bowel prep at home the day prior to admission and tolerated this well.   Hospital Course: On the day of admission the patient was taken to the operating room and underwent a laparoscopic-assisted right colectomy. Final pathology showed two villous adenomas without dysplasia and negative mesenteric lymph nodes. There was no sign of malignancy. This was discussed with the patient and his wife.  Postoperatively he progressed reasonably well. Foley catheter was removed on postop day 2 and he voided uneventfully. His diet was advanced as tolerated.Blood sugars were controlled with oral hypoglycemic agents and sliding scale insulin. Hypertension remained well controlled. At the time of discharge he was tolerating a regular diet and had had about 4 loose stools. He was ambulating independently. Pain was controlled. His wounds were clean. Potassium was 3.2 on the day prior to discharge and this was treated with potassium supplementation. Otherwise his usual medications were continued. I discussed the pathology report with him and his wife. Diet and activities were discussed. He was given a prescription for oxycodone for pain. He was asked to return to see me in the office for one week  Consults: None  Significant Diagnostic Studies: Surgical pathology  Treatments: surgery: Laparoscopic-assisted right colectomy. Potassium supplementation.  Disposition: Home  Patient Instructions:    Medication List         allopurinol 100 MG tablet  Commonly known as:  ZYLOPRIM  Take 100 mg by mouth daily.     amLODipine 10 MG tablet  Commonly known as:  NORVASC  Take 1 tablet (10 mg total) by mouth daily.     CINNAMON PO  Take 1 tablet by mouth daily.     diazepam 10 MG tablet  Commonly known as:  VALIUM  Take 10  mg by mouth every 6 (six) hours as needed for anxiety.     docusate sodium 100 MG capsule  Commonly known as:  COLACE  Take 100 mg by mouth daily.      FISH OIL PO  Take 1 capsule by mouth daily.     glimepiride 4 MG tablet  Commonly known as:  AMARYL  Take 4 mg by mouth 2 (two) times daily.     hydrochlorothiazide 25 MG tablet  Commonly known as:  HYDRODIURIL  Take 1 tablet (25 mg total) by mouth daily.     HYDROcodone-acetaminophen 10-325 MG per tablet  Commonly known as:  NORCO  Take 1 tablet by mouth every 6 (six) hours as needed for moderate pain.     ICY HOT BALM EXTRA STRENGTH EX  Apply 1 application topically at bedtime as needed (to shoulder).     losartan 100 MG tablet  Commonly known as:  COZAAR  Take 100 mg by mouth daily.     metFORMIN 500 MG tablet  Commonly known as:  GLUCOPHAGE  Take 500 mg by mouth 2 (two) times daily with a meal.     metoprolol 100 MG tablet  Commonly known as:  LOPRESSOR  Take 1 tablet (100 mg total) by mouth 2 (two) times daily.     NIACIN PO  Take 1 tablet by mouth daily.     oxyCODONE-acetaminophen 5-325 MG per tablet  Commonly known as:  PERCOCET/ROXICET  Take 1-2 tablets by mouth every 4 (four) hours as needed for moderate pain.     pantoprazole 40 MG tablet  Commonly known as:  PROTONIX  Take 1 tablet by mouth daily.     potassium chloride SA 20 MEQ tablet  Commonly known as:  K-DUR,KLOR-CON  Take 20 mEq by mouth daily.     RED YEAST RICE PO  Take 1 tablet by mouth daily.        Activity: no sports or heavy lifting for 5 weeks. No driving for 2 weeks. Diet: diabetic diet Wound Care: ookay to shower, otherwise keep wound lightly dressed with dry gauze bandage  Follow-up:  With Dr. Dalbert Batman in 1 week.  Signed: Edsel Petrin. Dalbert Batman, M.D., FACS General and minimally invasive surgery Breast and Colorectal Surgery  04/20/2014, 6:16 AM

## 2014-04-21 ENCOUNTER — Encounter (INDEPENDENT_AMBULATORY_CARE_PROVIDER_SITE_OTHER): Payer: BC Managed Care – PPO | Admitting: General Surgery

## 2014-04-26 ENCOUNTER — Telehealth: Payer: Self-pay

## 2014-04-26 NOTE — Telephone Encounter (Signed)
Pt need OV in 1 year to set up suveilance TCS Manuela Schwartz, please nic

## 2014-04-27 ENCOUNTER — Encounter (INDEPENDENT_AMBULATORY_CARE_PROVIDER_SITE_OTHER): Payer: Self-pay | Admitting: General Surgery

## 2014-04-27 ENCOUNTER — Ambulatory Visit (INDEPENDENT_AMBULATORY_CARE_PROVIDER_SITE_OTHER): Payer: BC Managed Care – PPO | Admitting: General Surgery

## 2014-04-27 VITALS — BP 130/70 | HR 60 | Temp 98.0°F | Resp 18 | Ht 72.0 in | Wt 255.0 lb

## 2014-04-27 DIAGNOSIS — D126 Benign neoplasm of colon, unspecified: Secondary | ICD-10-CM

## 2014-04-27 NOTE — Patient Instructions (Signed)
You are recovering from your laparoscopic-assisted right colectomy without any obvious surgical complications.  The staples were removed and Steri-Strips were placed on your wound today. The Steri-Strips may be peeled off after another 10 days.  You may shower but do not take a bath  You may start driving your car when you are comfortable  No sports or lifting more than 20 pounds for 5 weeks  Return to see Dr. Dalbert Batman in 5 weeks  Consider followup colonoscopy in one year.

## 2014-04-27 NOTE — Telephone Encounter (Signed)
Reminder in epic °

## 2014-04-27 NOTE — Progress Notes (Signed)
Patient ID: Benjamin [name], male   DOB: [birth year], 63 y.o.   MRN: [id number]  History: This gentleman underwent a laparoscopic-assisted right colectomy on 04/16/2014. Final pathology report showed 2 adenomatous polyps, 2.7 cm and 0.5 cm. No dysplasia.He was discharged on August 4, doing well. We have discussed his pathology report with him. He is doing well. Tolerating diet. Bowel movements are normal color, now solid, no constipation. No abdominal pain or nausea. No wound problems.   Exam: Patient looks well. No distress. Good spirits. Wife is with him Abdomen soft. Nontender. Not distended. Wounds looked good. Staples were removed and Steri-Strips are applied.   Assessment: Tubular adenomas of right colon without dysplasia, recovering satisfactorily in the early postop period following laparoscopic-assisted right colectomy History appendectomy with ruptured appendicitis with severe adhesions  Postop hypokalemia, mild  Type 2 diabetes  Hypertension  Gout  GERD  BMI 35  Plan: Diet and activities discussed. Hydration and fiber emphasized. No sports or heavy lifting for 5 weeks Return to see me in 5 weeks Consider followup colonoscopy in one year.   Edsel Petrin. Dalbert Batman, M.D., [facility] General and Minimally invasive Surgery Breast and Colorectal Surgery Office:   [phone] Pager:   [phone]

## 2014-06-01 ENCOUNTER — Encounter (INDEPENDENT_AMBULATORY_CARE_PROVIDER_SITE_OTHER): Payer: BC Managed Care – PPO | Admitting: General Surgery

## 2014-06-07 ENCOUNTER — Institutional Professional Consult (permissible substitution): Payer: Self-pay | Admitting: Neurology

## 2014-08-26 ENCOUNTER — Other Ambulatory Visit: Payer: Self-pay

## 2014-08-26 MED ORDER — PANTOPRAZOLE SODIUM 40 MG PO TBEC: 40.0000 mg | DELAYED_RELEASE_TABLET | Freq: Every day | ORAL | Status: DC

## 2014-09-24 ENCOUNTER — Other Ambulatory Visit: Payer: Self-pay | Admitting: Adult Health

## 2014-10-05 DIAGNOSIS — I10 Essential (primary) hypertension: Secondary | ICD-10-CM | POA: Diagnosis not present

## 2014-10-05 DIAGNOSIS — Z23 Encounter for immunization: Secondary | ICD-10-CM | POA: Diagnosis not present

## 2014-10-05 DIAGNOSIS — E782 Mixed hyperlipidemia: Secondary | ICD-10-CM | POA: Diagnosis not present

## 2014-10-05 DIAGNOSIS — Z6833 Body mass index (BMI) 33.0-33.9, adult: Secondary | ICD-10-CM | POA: Diagnosis not present

## 2014-10-05 DIAGNOSIS — E1165 Type 2 diabetes mellitus with hyperglycemia: Secondary | ICD-10-CM | POA: Diagnosis not present

## 2015-02-03 ENCOUNTER — Ambulatory Visit: Payer: Self-pay | Admitting: Cardiology

## 2015-02-08 ENCOUNTER — Ambulatory Visit (INDEPENDENT_AMBULATORY_CARE_PROVIDER_SITE_OTHER): Payer: Self-pay | Admitting: Adult Health

## 2015-02-08 ENCOUNTER — Encounter: Payer: Self-pay | Admitting: Adult Health

## 2015-02-08 VITALS — BP 130/86 | HR 51 | Ht 73.0 in | Wt 258.8 lb

## 2015-02-08 DIAGNOSIS — R0600 Dyspnea, unspecified: Secondary | ICD-10-CM | POA: Diagnosis not present

## 2015-02-08 DIAGNOSIS — I10 Essential (primary) hypertension: Secondary | ICD-10-CM

## 2015-02-08 NOTE — Progress Notes (Signed)
Cardiology Office Note   Date:  02/08/2015   ID:  Flay, Ghosh 1951-06-04, MRN [id number]  PCP:  [name], PA-C  Cardiologist:  [place]/ Jory Sims, NP   Chief Complaint  Patient presents with  . Hypertension      History of Present Illness: Benjamin [name] is a 64 y.o. male who presents for ongoing assessment and management of hypertensive heart disease, Grade II diastolic dysfunction, and hyperlipidemia. He was last seen by Dr. Domenic Polite in July of 2015, prior to a laparoscopic-assited right colectomy. He was stable and of low risk for CV event and cleared for surgery.   Since surgery he has felt more tired and short of breath. He states that he had bene on O2, but his insurance company was not accepted at [employer] and he was unable to pay out of pocket for the O2. He had to send it back. As a result he is more fatigued and DOE. He denies chest pain or dizziness. Has some tingling in his arms sometimes.   Past Medical History  Diagnosis Date  . Essential hypertension, benign   . Type 2 diabetes mellitus   . Mixed hyperlipidemia     Statin intolerance  . Gout   . Sleep apnea     Possible, uses oxygen at bedtime at times. no formal sleep study.  . Rotator cuff tear     Chronic pain  . Tubular adenoma   . Hypertensive heart disease     LVH with grade 2 diastolic dysfunction  . Arthritis   . CHF (congestive heart failure)     diastolic CHF    Past Surgical History  Procedure Laterality Date  . Appendectomy    . Colonoscopy N/A 10/29/2013    Dr. [name]- sigmoid polyp status post cold snare removal. large sessile ascending colon polyp. debulked with saline assisted snare polypectomy . pancolonic diverticulosis. inadequate prep. tubular adenoma on bx  . Colonoscopy N/A 02/22/2014    Dr. [name]: Saline-assisted snare polypectomy/biopsy for sprawling carpet polyp in the ascending colon which could not be completely removed. Status post biopsy (tubular  adenoma), tattooed  . Esophagogastroduodenoscopy N/A 02/22/2014    Dr. [name]: Erosive reflux esophagitis. Schatzki ring status post dilation. Gastric and duodenal erosions with benign biopsies.  Azzie Almas dilation N/A 02/22/2014    Procedure: Azzie Almas DILATION;  Surgeon: [name], MD;  [facility];  Service: Endoscopy;  Laterality: N/A;  [name] dilation N/A 02/22/2014    Procedure: [name] DILATION;  Surgeon: [name], MD;  [facility]: [facility];  Service: Endoscopy;  Laterality: N/A;  . Colonoscopy N/A 03/01/2014    Procedure: COLONOSCOPY;  Surgeon: [name], MD;  [facility]: [facility];  Service: Endoscopy;  Laterality: N/A;  . Laparoscopic right colectomy N/A 04/16/2014    Procedure: LAPAROSCOPIC ASSISTED RIGHT COLECTOMY;  Surgeon: [name], MD;  Location: [place];  Service: General;  Laterality: N/A;     Current Outpatient Prescriptions  Medication Sig Dispense Refill  . allopurinol (ZYLOPRIM) 100 MG tablet Take 100 mg by mouth daily.    [name] amLODipine (NORVASC) 10 MG tablet Take 1 tablet (10 mg total) by mouth daily. 30 tablet 6  . CINNAMON PO Take 1 tablet by mouth daily.    . diazepam (VALIUM) 10 MG tablet Take 10 mg by mouth every 6 (six) hours as needed for anxiety.    . docusate sodium (COLACE) 100 MG capsule Take 100 mg by mouth  daily.    . glimepiride (AMARYL) 4 MG tablet Take 4 mg by mouth 2 (two) times daily.    . hydrochlorothiazide (HYDRODIURIL) 25 MG tablet TAKE ONE TABLET BY MOUTH ONCE DAILY 30 tablet 6  . HYDROcodone-acetaminophen (NORCO) 10-325 MG per tablet Take 1 tablet by mouth every 6 (six) hours as needed for moderate pain.     [name] losartan (COZAAR) 100 MG tablet Take 100 mg by mouth daily.    . Menthol-Methyl Salicylate (ICY HOT BALM EXTRA STRENGTH EX) Apply 1 application topically at bedtime as needed (to shoulder).    . metFORMIN (GLUCOPHAGE) 500 MG tablet Take 500 mg by mouth 2 (two) times daily with a meal.     . metoprolol  (LOPRESSOR) 100 MG tablet Take 1 tablet (100 mg total) by mouth 2 (two) times daily. 60 tablet 6  . NIACIN PO Take 1 tablet by mouth daily.    . Omega-3 Fatty Acids (FISH OIL PO) Take 1 capsule by mouth daily.    [name] oxyCODONE-acetaminophen (PERCOCET/ROXICET) 5-325 MG per tablet Take 1-2 tablets by mouth every 4 (four) hours as needed for moderate pain. 30 tablet 0  . pantoprazole (PROTONIX) 40 MG tablet Take 1 tablet (40 mg total) by mouth daily. 30 tablet 11  . potassium chloride SA (K-DUR,KLOR-CON) 20 MEQ tablet Take 20 mEq by mouth daily.    . Red Yeast Rice Extract (RED YEAST RICE PO) Take 1 tablet by mouth daily.     No current facility-administered medications for this visit.    Allergies:   Niacin-lovastatin er; Statins; and Zocor    Social History:  The patient  reports that he quit smoking about 39 years ago. His smoking use included Cigarettes. He started smoking about 53 years ago. He has never used smokeless tobacco. He reports that he does not drink alcohol or use illicit drugs.   Family History:  The patient's family history includes Diabetes type II in his mother. There is no history of Colon cancer.    ROS: .   All other systems are reviewed and negative.Unless otherwise mentioned in  H&P above.   PHYSICAL EXAM: VS:  BP 130/86 mmHg  Pulse 51  Ht 6\' 1"  (1.854 m)  Wt 258 lb 12.8 oz (117.391 kg)  BMI 34.15 kg/m2  SpO2 97% , BMI Body mass index is 34.15 kg/(m^2). GEN: Well nourished, well developed, in no acute distress HEENT: normal Neck: no JVD, carotid bruits, or masses Cardiac: RRR; no murmurs, rubs, or gallops,no edema  Respiratory:  clear to auscultation bilaterally, normal work of breathing GI: soft, nontender, nondistended, + BS MS: no deformity or atrophy Skin: warm and dry, no rash Neuro:  Strength and sensation are intact Psych: euthymic mood, full affect     Recent Labs: 04/12/2014: ALT 31 04/19/2014: Hemoglobin 10.9*; Platelets 173 04/20/2014: BUN 12;  Creatinine 1.14; Potassium 3.6*; Sodium 142    Lipid Panel No results found for: CHOL, TRIG, HDL, CHOLHDL, VLDL, LDLCALC, LDLDIRECT    Wt Readings from Last 3 Encounters:  02/08/15 258 lb 12.8 oz (117.391 kg)  04/27/14 255 lb (115.667 kg)  04/16/14 257 lb 4 oz (116.688 kg)      Other studies Reviewed: Additional studies/ records that were reviewed today include: None Review of the above records demonstrates: N/A   ASSESSMENT AND PLAN:  1. Hypertensive Heart Disease: He is currently stable. BP is well controlled. No changes in his medication regimen.   2. Dyspnea: He is advised to seek his PCP  for reinstitution of O2 support and connection with a provider that will take his insurance. The names, [facility], [facility] have been offered to him to check out.    Current medicines are reviewed at length with the patient today.    Labs/ tests ordered today include: None No orders of the defined types were placed in this encounter.     Disposition:   FU with 6 months.  Signed, Jory Sims, NP  02/08/2015 2:58 PM    [place] [address], [place], [place] [address] Phone: [phone]; Fax: [phone]

## 2015-02-08 NOTE — Progress Notes (Deleted)
Name: Benjamin [name]    DOB: 01/15/1951  Age: 64 y.o.  MR#: [id number]       PCP:  [name], PA-C      Insurance: Payor: [employer] Rossetti MEDICARE / Plan: Why THN/NTSP / Product Type: *No Product type* /   CC:    Chief Complaint  Patient presents with  . Hypertension    VS Filed Vitals:   02/08/15 1334  BP: 130/86  Pulse: 51  Height: 6\' 1"  (1.854 m)  Weight: 258 lb 12.8 oz (117.391 kg)  SpO2: 97%    Weights Current Weight  02/08/15 258 lb 12.8 oz (117.391 kg)  04/27/14 255 lb (115.667 kg)  04/16/14 257 lb 4 oz (116.688 kg)    Blood Pressure  BP Readings from Last 3 Encounters:  02/08/15 130/86  04/27/14 130/70  04/20/14 153/87     Admit date:  (Not on file) Last encounter with RMR:  09/24/2014   Allergy Niacin-lovastatin er; Statins; and Zocor  Current Outpatient Prescriptions  Medication Sig Dispense Refill  . allopurinol (ZYLOPRIM) 100 MG tablet Take 100 mg by mouth daily.    [name] amLODipine (NORVASC) 10 MG tablet Take 1 tablet (10 mg total) by mouth daily. 30 tablet 6  . CINNAMON PO Take 1 tablet by mouth daily.    . diazepam (VALIUM) 10 MG tablet Take 10 mg by mouth every 6 (six) hours as needed for anxiety.    . docusate sodium (COLACE) 100 MG capsule Take 100 mg by mouth daily.    [name] glimepiride (AMARYL) 4 MG tablet Take 4 mg by mouth 2 (two) times daily.    . hydrochlorothiazide (HYDRODIURIL) 25 MG tablet TAKE ONE TABLET BY MOUTH ONCE DAILY 30 tablet 6  . HYDROcodone-acetaminophen (NORCO) 10-325 MG per tablet Take 1 tablet by mouth every 6 (six) hours as needed for moderate pain.     [name] losartan (COZAAR) 100 MG tablet Take 100 mg by mouth daily.    . Menthol-Methyl Salicylate (ICY HOT BALM EXTRA STRENGTH EX) Apply 1 application topically at bedtime as needed (to shoulder).    . metFORMIN (GLUCOPHAGE) 500 MG tablet Take 500 mg by mouth 2 (two) times daily with a meal.     . metoprolol (LOPRESSOR) 100 MG tablet Take 1 tablet (100 mg total) by mouth 2 (two)  times daily. 60 tablet 6  . NIACIN PO Take 1 tablet by mouth daily.    . Omega-3 Fatty Acids (FISH OIL PO) Take 1 capsule by mouth daily.    [name] oxyCODONE-acetaminophen (PERCOCET/ROXICET) 5-325 MG per tablet Take 1-2 tablets by mouth every 4 (four) hours as needed for moderate pain. 30 tablet 0  . pantoprazole (PROTONIX) 40 MG tablet Take 1 tablet (40 mg total) by mouth daily. 30 tablet 11  . potassium chloride SA (K-DUR,KLOR-CON) 20 MEQ tablet Take 20 mEq by mouth daily.    . Red Yeast Rice Extract (RED YEAST RICE PO) Take 1 tablet by mouth daily.     No current facility-administered medications for this visit.    Discontinued Meds:   There are no discontinued medications.  Patient Active Problem List   Diagnosis Date Noted  . Adenomatous colon polyp 04/16/2014  . Preoperative cardiovascular examination 03/24/2014  . Hypertensive heart disease 03/24/2014  . Personal history of colonic polyps 03/04/2014  . Rectal bleeding 03/01/2014  . Diverticulosis of colon 03/01/2014  . Gout 12/26/2012  . Obesity 12/20/2012  . Essential hypertension, benign 10/22/2011  . Sleep apnea 10/22/2011  .  Hyperlipidemia 10/22/2011  . Type II or unspecified type diabetes mellitus without mention of complication, uncontrolled 10/22/2011    LABS    Component Value Date/Time   NA 142 04/20/2014 0508   NA 142 04/19/2014 0500   NA 134* 04/18/2014 1027   K 3.6* 04/20/2014 0508   K 3.2* 04/19/2014 0500   K 3.9 04/18/2014 1027   CL 101 04/20/2014 0508   CL 99 04/19/2014 0500   CL 96 04/18/2014 1027   CO2 28 04/20/2014 0508   CO2 31 04/19/2014 0500   CO2 27 04/18/2014 1027   GLUCOSE 111* 04/20/2014 0508   GLUCOSE 104* 04/19/2014 0500   GLUCOSE 193* 04/18/2014 1027   BUN 12 04/20/2014 0508   BUN 8 04/19/2014 0500   BUN 10 04/18/2014 1027   CREATININE 1.14 04/20/2014 0508   CREATININE 1.16 04/19/2014 0500   CREATININE 1.15 04/18/2014 1027   CREATININE 1.33 02/13/2013 1535   CREATININE 1.35  01/16/2013 1502   CREATININE 1.10 10/22/2011 1050   CALCIUM 9.0 04/20/2014 0508   CALCIUM 9.0 04/19/2014 0500   CALCIUM 8.4 04/18/2014 1027   GFRNONAA 66* 04/20/2014 0508   GFRNONAA 65* 04/19/2014 0500   GFRNONAA 65* 04/18/2014 1027   GFRAA 77* 04/20/2014 0508   GFRAA 75* 04/19/2014 0500   GFRAA 76* 04/18/2014 1027   CMP     Component Value Date/Time   NA 142 04/20/2014 0508   K 3.6* 04/20/2014 0508   CL 101 04/20/2014 0508   CO2 28 04/20/2014 0508   GLUCOSE 111* 04/20/2014 0508   BUN 12 04/20/2014 0508   CREATININE 1.14 04/20/2014 0508   CREATININE 1.33 02/13/2013 1535   CALCIUM 9.0 04/20/2014 0508   PROT 7.8 04/12/2014 1018   ALBUMIN 3.9 04/12/2014 1018   AST 26 04/12/2014 1018   ALT 31 04/12/2014 1018   ALKPHOS 105 04/12/2014 1018   BILITOT 0.3 04/12/2014 1018   GFRNONAA 66* 04/20/2014 0508   GFRAA 77* 04/20/2014 0508       Component Value Date/Time   WBC 13.2* 04/19/2014 0500   WBC 14.1* 04/18/2014 1027   WBC 16.0* 04/17/2014 0351   HGB 10.9* 04/19/2014 0500   HGB 10.8* 04/18/2014 1027   HGB 11.7* 04/17/2014 0351   HCT 33.1* 04/19/2014 0500   HCT 33.3* 04/18/2014 1027   HCT 36.0* 04/17/2014 0351   MCV 87.1 04/19/2014 0500   MCV 89.3 04/18/2014 1027   MCV 87.6 04/17/2014 0351    Lipid Panel  No results found for: CHOL, TRIG, HDL, CHOLHDL, VLDL, LDLCALC, LDLDIRECT  ABG No results found for: PHART, PCO2ART, PO2ART, HCO3, TCO2, ACIDBASEDEF, O2SAT   Lab Results  Component Value Date   TSH 0.799 12/20/2012   BNP (last 3 results) No results for input(s): BNP in the last 8760 hours.  ProBNP (last 3 results) No results for input(s): PROBNP in the last 8760 hours.  Cardiac Panel (last 3 results) No results for input(s): CKTOTAL, CKMB, TROPONINI, RELINDX in the last 72 hours.  Iron/TIBC/Ferritin/ %Sat No results found for: IRON, TIBC, FERRITIN, IRONPCTSAT   EKG Orders placed or performed in visit on 03/24/14  . EKG 12-Lead     Prior Assessment and  Plan Problem List as of 02/08/2015      Cardiovascular and Mediastinum   Essential hypertension, benign   Last Assessment & Plan 03/24/2014 Office Visit Written 03/24/2014 11:38 AM by Satira Sark, MD    Blood pressure is controlled today.      Hypertensive heart disease  Last Assessment & Plan 03/24/2014 Office Visit Written 03/24/2014 11:38 AM by Satira Sark, MD    Normal LVEF with severe LVH and grade 2 diastolic dysfunction in the setting of hypertension. No active heart failure symptoms. Continue medical therapy.        Digestive   Rectal bleeding   Diverticulosis of colon   Adenomatous colon polyp     Endocrine   Type II or unspecified type diabetes mellitus without mention of complication, uncontrolled   Last Assessment & Plan 03/24/2014 Office Visit Written 03/24/2014 11:39 AM by Satira Sark, MD    Followed by Dr. Orson Ape.        Other   Sleep apnea   Last Assessment & Plan 09/22/2013 Office Visit Written 09/22/2013  5:19 PM by Lendon Colonel, NP    Does not tolerate CPAP, this is causing significant fatigue during the day and some insomnia.      Hyperlipidemia   Last Assessment & Plan 03/17/2013 Office Visit Written 03/17/2013  1:24 PM by Lendon Colonel, NP    Followed by PCP.  Continue risk management with low cholesterol diet.      Obesity   Last Assessment & Plan 03/17/2013 Office Visit Written 03/17/2013  1:25 PM by Lendon Colonel, NP    I think some of his symptoms are lack of exercise and wt. He is not motivated to increase his activity. There also appears to be a component of low level depression. PCP should address this at his discretion.      Gout   Last Assessment & Plan 12/26/2012 Office Visit Written 12/26/2012 10:07 AM by Josue Hector, MD    Discussed with Dr Orson Ape  Would start colchicine and tramadol  Try to avoid prednisone as his BS is already poorly controlled. Dr Orson Ape to see today      Personal history of colonic polyps    Preoperative cardiovascular examination   Last Assessment & Plan 03/24/2014 Office Visit Edited 04/08/2014  8:15 AM by Satira Sark, MD    Patient being considered for laparoscopic-assisted right colectomy with Dr. Dalbert Batman as noted above. Blood pressure is very well controlled on the current regimen. He is clinically stable without active heart failure symptoms or chest pain, and is cleared to proceed with surgery at an acceptable perioperative cardiac risk. He should continue his antihypertensive regimen throughout.          Imaging: No results found.

## 2015-02-08 NOTE — Patient Instructions (Signed)
Your physician wants you to follow-up in: 6 months with Jory Sims, NP.  You will receive a reminder letter in the mail two months in advance. If you don't receive a letter, please call our office to schedule the follow-up appointment.  Your physician recommends that you continue on your current medications as directed. Please refer to the Current Medication list given to you today.  Please see you primary care physician to reorder oxygen for Shortness of Breath. [facility], [facility], or [place])   Thank you for choosing [facility]!

## 2015-03-23 ENCOUNTER — Encounter: Payer: Self-pay | Admitting: Internal Medicine

## 2015-04-25 DIAGNOSIS — J209 Acute bronchitis, unspecified: Secondary | ICD-10-CM | POA: Diagnosis not present

## 2015-04-25 DIAGNOSIS — E6609 Other obesity due to excess calories: Secondary | ICD-10-CM | POA: Diagnosis not present

## 2015-04-25 DIAGNOSIS — E1129 Type 2 diabetes mellitus with other diabetic kidney complication: Secondary | ICD-10-CM | POA: Diagnosis not present

## 2015-04-25 DIAGNOSIS — E1165 Type 2 diabetes mellitus with hyperglycemia: Secondary | ICD-10-CM | POA: Diagnosis not present

## 2015-04-25 DIAGNOSIS — Z1389 Encounter for screening for other disorder: Secondary | ICD-10-CM | POA: Diagnosis not present

## 2015-04-25 DIAGNOSIS — Z6833 Body mass index (BMI) 33.0-33.9, adult: Secondary | ICD-10-CM | POA: Diagnosis not present

## 2015-05-11 ENCOUNTER — Encounter: Payer: Self-pay | Admitting: Nurse Practitioner

## 2015-05-11 ENCOUNTER — Other Ambulatory Visit: Payer: Self-pay

## 2015-05-11 ENCOUNTER — Ambulatory Visit (INDEPENDENT_AMBULATORY_CARE_PROVIDER_SITE_OTHER): Payer: Commercial Managed Care - HMO | Admitting: Nurse Practitioner

## 2015-05-11 ENCOUNTER — Ambulatory Visit: Payer: Commercial Managed Care - HMO | Admitting: Gastroenterology

## 2015-05-11 VITALS — BP 142/89 | HR 50 | Temp 98.3°F | Ht 73.0 in | Wt 255.0 lb

## 2015-05-11 DIAGNOSIS — D126 Benign neoplasm of colon, unspecified: Secondary | ICD-10-CM

## 2015-05-11 DIAGNOSIS — Z9049 Acquired absence of other specified parts of digestive tract: Secondary | ICD-10-CM

## 2015-05-11 MED ORDER — PEG 3350-KCL-NA BICARB-NACL 420 G PO SOLR: 4000.0000 mL | Freq: Once | ORAL | Status: DC

## 2015-05-11 NOTE — Progress Notes (Signed)
CC'ED TO PCP 

## 2015-05-11 NOTE — Assessment & Plan Note (Signed)
Patient with a history of adenomatous colon polyps and post-polypectomy bleed. Patient is status post right hemicolectomy for sessile polyp unable to be removed endoscopically. He is doing quite well and is due for one year surveillance colonoscopy. Is asymptomatic from a GI standpoint. At this point we'll proceed with surveillance colonoscopy.  Proceed with TCS with Dr. [name] in near future: the risks, benefits, and alternatives have been discussed with the patient in detail. The patient states understanding and desires to proceed.  The patient is not on any anxiolytics, chronic pain medicines, anticoagulants, antidepressants. Last procedure completed with conscious sedation and this should be adequate for his procedure.

## 2015-05-11 NOTE — Progress Notes (Signed)
Referring Provider: Ginger Organ Primary Care Physician:  [name], PA-C Primary GI:  Dr. [name]  Chief Complaint  Patient presents with  . Follow-up    HPI:   64 year old male presents for follow-up surveillance colonoscopy. He has history of a large sessile polyp which was debulked a couple times endoscopically however was unable to have complete removal. Last colonoscopy 03/01/2014 with GI bleed found fresh blood clot in the lumen the rectum and the cecum and the site of prior ascending colon polypectomy identified with the site ulcer crater having a visible bleeding vessel. 6 clips were employed and epinephrine injection used for hemostasis. Patient subsequently followed up with surgery for an elective right hemicolectomy. Surgery was completed by Dr. Dalbert Batman in [place] on 04/16/2014. Final postop pathology found to adenomatous polyps and the tattooed site was well within the specimen. Surgery was tolerated well.  Today he states he is doing well. He has occasionally cramping in his upper abdomen/chest but when he bends, which resolves when he stands up. See's cardiology last visit 02/08/15 but he did not mention this to his cardiologist. Upon further discussion he states it is not a pain or pressure but a "muscle twitching." Denies any shortness of breath, diaphoresis at the time. Muscle twitching resolved promptly when he stands upright. Doing well otherwise, denies abdominal pain, N/V, diarrhea, hematochezia, melena. Denies dizziness, lightheadedness, syncope, near syncope. Denies any other upper or lower GI symptoms.  Past Medical History  Diagnosis Date  . Essential hypertension, benign   . Type 2 diabetes mellitus   . Mixed hyperlipidemia     Statin intolerance  . Gout   . Sleep apnea     Possible, uses oxygen at bedtime at times. no formal sleep study.  . Rotator cuff tear     Chronic pain  . Tubular adenoma   . Hypertensive heart disease     LVH with grade  2 diastolic dysfunction  . Arthritis   . CHF (congestive heart failure)     diastolic CHF    Past Surgical History  Procedure Laterality Date  . Appendectomy    . Colonoscopy N/A 10/29/2013    Dr. [name]- sigmoid polyp status post cold snare removal. large sessile ascending colon polyp. debulked with saline assisted snare polypectomy . pancolonic diverticulosis. inadequate prep. tubular adenoma on bx  . Colonoscopy N/A 02/22/2014    Dr. [name]: Saline-assisted snare polypectomy/biopsy for sprawling carpet polyp in the ascending colon which could not be completely removed. Status post biopsy (tubular adenoma), tattooed  . Esophagogastroduodenoscopy N/A 02/22/2014    Dr. [name]: Erosive reflux esophagitis. Schatzki ring status post dilation. Gastric and duodenal erosions with benign biopsies.  Azzie Almas dilation N/A 02/22/2014    Procedure: Azzie Almas DILATION;  Surgeon: [name], MD;  [facility];  Service: Endoscopy;  Laterality: N/A;  [name] dilation N/A 02/22/2014    Procedure: [name] DILATION;  Surgeon: [name], MD;  Location: [facility];  Service: Endoscopy;  Laterality: N/A;  . Colonoscopy N/A 03/01/2014    [facility]:CWCB polypectomy hemorrhage s/p bleeding  . Laparoscopic right colectomy N/A 04/16/2014    Procedure: LAPAROSCOPIC ASSISTED RIGHT COLECTOMY;  Surgeon: [name], MD;  Location: [facility];  Service: General;  Laterality: N/A;    Current Outpatient Prescriptions  Medication Sig Dispense Refill  . allopurinol (ZYLOPRIM) 100 MG tablet Take 100 mg by mouth daily.    [name] amLODipine (NORVASC) 10 MG tablet Take 1 tablet (10  mg total) by mouth daily. 30 tablet 6  . CINNAMON PO Take 1 tablet by mouth daily.    . diazepam (VALIUM) 10 MG tablet Take 10 mg by mouth every 6 (six) hours as needed for anxiety.    . docusate sodium (COLACE) 100 MG capsule Take 100 mg by mouth daily.    [name] glimepiride (AMARYL) 4 MG tablet Take 4 mg by mouth 2 (two) times daily.    .  hydrochlorothiazide (HYDRODIURIL) 25 MG tablet TAKE ONE TABLET BY MOUTH ONCE DAILY 30 tablet 6  . losartan (COZAAR) 100 MG tablet Take 100 mg by mouth daily.    . Menthol-Methyl Salicylate (ICY HOT BALM EXTRA STRENGTH EX) Apply 1 application topically at bedtime as needed (to shoulder).    . metFORMIN (GLUCOPHAGE) 500 MG tablet Take 500 mg by mouth 2 (two) times daily with a meal.     . metoprolol (LOPRESSOR) 100 MG tablet Take 1 tablet (100 mg total) by mouth 2 (two) times daily. 60 tablet 6  . NIACIN PO Take 1 tablet by mouth daily.    . Omega-3 Fatty Acids (FISH OIL PO) Take 1 capsule by mouth daily.    . pantoprazole (PROTONIX) 40 MG tablet Take 1 tablet (40 mg total) by mouth daily. 30 tablet 11  . potassium chloride SA (K-DUR,KLOR-CON) 20 MEQ tablet Take 20 mEq by mouth daily.    . Red Yeast Rice Extract (RED YEAST RICE PO) Take 1 tablet by mouth daily.     No current facility-administered medications for this visit.    Allergies as of 05/11/2015 - Review Complete 02/08/2015  Allergen Reaction Noted  . Niacin-lovastatin er Other (See Comments) 10/18/2011  . Statins  04/09/2014  . Zocor [simvastatin - high dose] Other (See Comments) 10/18/2011    Family History  Problem Relation Age of Onset  . Diabetes type II Mother   . Colon cancer Neg Hx     Social History   Social History  . Marital Status: Married    Spouse Name: N/A  . Number of Children: 2  . Years of Education: N/A   Occupational History  . Clinical biochemist   .     Social History Main Topics  . Smoking status: Former Smoker    Types: Cigarettes    Start date: 09/17/1961    Quit date: 09/18/1975  . Smokeless tobacco: Never Used     Comment: quit 35 years ago  . Alcohol Use: No     Comment: None in over a year. prior to that 1-2 beers couple times per week.  . Drug Use: No  . Sexual Activity: Not on file   Other Topics Concern  . Not on file   Social History Narrative    Review of Systems: General:  Negative for anorexia, weight loss, fever, chills, fatigue, weakness. CV: Negative for chest pain, angina, palpitations,  peripheral edema.  Respiratory: Negative for dyspnea at rest, dyspnea on exertion, cough, sputum, wheezing.  GI: See history of present illness. Endo: Negative for unusual weight change.  Heme: Negative for bruising or bleeding.   Physical Exam: BP 142/89 mmHg  Pulse 50  Temp(Src) 98.3 F (36.8 C) (Oral)  Ht 6\' 1"  (1.854 m)  Wt 255 lb (115.667 kg)  BMI 33.65 kg/m2 General:   Alert and oriented. Pleasant and cooperative. Well-nourished and well-developed.  Head:  Normocephalic and atraumatic. Eyes:  Without icterus, sclera clear and conjunctiva pink.  Ears:  Normal auditory acuity. Cardiovascular:  S1, S2 present  without murmurs appreciated. Normal pulses noted. Extremities without clubbing or edema. Respiratory:  Clear to auscultation bilaterally. No wheezes, rales, or rhonchi. No distress.  Gastrointestinal:  +BS, rounded, soft, non-tender and non-distended. Well-healed midline incisional scar noted. No HSM noted. No guarding or rebound. No masses appreciated.  Rectal:  Deferred  Neurologic:  Alert and oriented x4;  grossly normal neurologically. Psych:  Alert and cooperative. Normal mood and affect. Heme/Lymph/Immune: No excessive bruising noted.    05/11/2015 9:14 AM

## 2015-05-11 NOTE — Patient Instructions (Signed)
1. We will schedule your procedure for you. 2. Follow-up based on recommendations after procedure or for any new/worsening GI symptoms.

## 2015-06-08 ENCOUNTER — Encounter (HOSPITAL_COMMUNITY)
Admission: RE | Disposition: A | Discharge status: Home Health | Payer: Self-pay | Source: Ambulatory Visit | Attending: Internal Medicine

## 2015-06-08 ENCOUNTER — Encounter (HOSPITAL_COMMUNITY): Payer: Self-pay | Admitting: *Deleted

## 2015-06-08 ENCOUNTER — Ambulatory Visit (HOSPITAL_COMMUNITY)
Admission: RE | Admit: 2015-06-08 | Discharge: 2015-06-08 | Disposition: A | Discharge status: Home Health | Payer: Commercial Managed Care - HMO | Source: Ambulatory Visit | Attending: Internal Medicine | Admitting: Internal Medicine

## 2015-06-08 DIAGNOSIS — Z79899 Other long term (current) drug therapy: Secondary | ICD-10-CM | POA: Insufficient documentation

## 2015-06-08 DIAGNOSIS — E119 Type 2 diabetes mellitus without complications: Secondary | ICD-10-CM | POA: Diagnosis not present

## 2015-06-08 DIAGNOSIS — I10 Essential (primary) hypertension: Secondary | ICD-10-CM | POA: Insufficient documentation

## 2015-06-08 DIAGNOSIS — Z860100 Personal history of colon polyps, unspecified: Secondary | ICD-10-CM | POA: Insufficient documentation

## 2015-06-08 DIAGNOSIS — E782 Mixed hyperlipidemia: Secondary | ICD-10-CM | POA: Insufficient documentation

## 2015-06-08 DIAGNOSIS — Z87891 Personal history of nicotine dependence: Secondary | ICD-10-CM | POA: Insufficient documentation

## 2015-06-08 DIAGNOSIS — Z1211 Encounter for screening for malignant neoplasm of colon: Secondary | ICD-10-CM | POA: Diagnosis not present

## 2015-06-08 DIAGNOSIS — Z9049 Acquired absence of other specified parts of digestive tract: Secondary | ICD-10-CM | POA: Diagnosis not present

## 2015-06-08 DIAGNOSIS — K5730 Diverticulosis of large intestine without perforation or abscess without bleeding: Secondary | ICD-10-CM | POA: Insufficient documentation

## 2015-06-08 HISTORY — PX: COLONOSCOPY: SHX5424

## 2015-06-08 LAB — GLUCOSE, CAPILLARY: GLUCOSE-CAPILLARY: 196 mg/dL — AB (ref 65–99)

## 2015-06-08 SURGERY — COLONOSCOPY
Anesthesia: Moderate Sedation

## 2015-06-08 MED ORDER — ONDANSETRON HCL 4 MG/2ML IJ SOLN
INTRAMUSCULAR | Status: AC
  Filled 2015-06-08: qty 2

## 2015-06-08 MED ORDER — SODIUM CHLORIDE 0.9 % IV SOLN
INTRAVENOUS | Status: DC
  Administered 2015-06-08: 09:00:00 via INTRAVENOUS

## 2015-06-08 MED ORDER — MEPERIDINE HCL 100 MG/ML IJ SOLN
INTRAMUSCULAR | Status: AC
  Filled 2015-06-08: qty 2

## 2015-06-08 MED ORDER — ONDANSETRON HCL 4 MG/2ML IJ SOLN
INTRAMUSCULAR | Status: DC | PRN
  Administered 2015-06-08: 4 mg via INTRAVENOUS

## 2015-06-08 MED ORDER — MIDAZOLAM HCL 5 MG/5ML IJ SOLN
INTRAMUSCULAR | Status: AC
  Filled 2015-06-08: qty 10

## 2015-06-08 MED ORDER — MIDAZOLAM HCL 5 MG/5ML IJ SOLN
INTRAMUSCULAR | Status: DC | PRN
  Administered 2015-06-08: 2 mg via INTRAVENOUS
  Administered 2015-06-08: 1 mg via INTRAVENOUS
  Administered 2015-06-08: 2 mg via INTRAVENOUS

## 2015-06-08 MED ORDER — STERILE WATER FOR IRRIGATION IR SOLN
Status: DC | PRN
  Administered 2015-06-08: 09:00:00

## 2015-06-08 MED ORDER — MEPERIDINE HCL 100 MG/ML IJ SOLN
INTRAMUSCULAR | Status: DC | PRN
  Administered 2015-06-08: 25 mg via INTRAVENOUS
  Administered 2015-06-08: 50 mg via INTRAVENOUS

## 2015-06-08 NOTE — Discharge Instructions (Signed)
Colonoscopy Discharge Instructions  Read the instructions outlined below and refer to this sheet in the next few weeks. These discharge instructions provide you with general information on caring for yourself after you leave the hospital. Your doctor may also give you specific instructions. While your treatment has been planned according to the most current medical practices available, unavoidable complications occasionally occur. If you have any problems or questions after discharge, call Dr. [name] at [phone]. ACTIVITY  You may resume your regular activity, but move at a slower pace for the next 24 hours.   Take frequent rest periods for the next 24 hours.   Walking will help get rid of the air and reduce the bloated feeling in your belly (abdomen).   No driving for 24 hours (because of the medicine (anesthesia) used during the test).    Do not sign any important legal documents or operate any machinery for 24 hours (because of the anesthesia used during the test).  NUTRITION  Drink plenty of fluids.   You may resume your normal diet as instructed by your doctor.   Begin with a light meal and progress to your normal diet. Heavy or fried foods are harder to digest and may make you feel sick to your stomach (nauseated).   Avoid alcoholic beverages for 24 hours or as instructed.  MEDICATIONS  You may resume your normal medications unless your doctor tells you otherwise.  WHAT YOU CAN EXPECT TODAY  Some feelings of bloating in the abdomen.   Passage of more gas than usual.   Spotting of blood in your stool or on the toilet paper.  IF YOU HAD POLYPS REMOVED DURING THE COLONOSCOPY:  No aspirin products for 7 days or as instructed.   No alcohol for 7 days or as instructed.   Eat a soft diet for the next 24 hours.  FINDING OUT THE RESULTS OF YOUR TEST Not all test results are available during your visit. If your test results are not back during the visit, make an appointment  with your caregiver to find out the results. Do not assume everything is normal if you have not heard from your caregiver or the medical facility. It is important for you to follow up on all of your test results.  SEEK IMMEDIATE MEDICAL ATTENTION IF:  You have more than a spotting of blood in your stool.   Your belly is swollen (abdominal distention).   You are nauseated or vomiting.   You have a temperature over 101.   You have abdominal pain or discomfort that is severe or gets worse throughout the day.    Diverticulosis information provided  Repeat colonoscopy in 5 years  Diverticulosis Diverticulosis is the condition that develops when small pouches (diverticula) form in the wall of your colon. Your colon, or large intestine, is where water is absorbed and stool is formed. The pouches form when the inside layer of your colon pushes through weak spots in the outer layers of your colon. CAUSES  No one knows exactly what causes diverticulosis. RISK FACTORS  Being older than 3. Your risk for this condition increases with age. Diverticulosis is rare in people younger than 40 years. By age 22, almost everyone has it.  Eating a low-fiber diet.  Being frequently constipated.  Being overweight.  Not getting enough exercise.  Smoking.  Taking over-the-counter pain medicines, like aspirin and ibuprofen. SYMPTOMS  Most people with diverticulosis do not have symptoms. DIAGNOSIS  Because diverticulosis often has no symptoms, health  care providers often discover the condition during an exam for other colon problems. In many cases, a health care provider will diagnose diverticulosis while using a flexible scope to examine the colon (colonoscopy). TREATMENT  If you have never developed an infection related to diverticulosis, you may not need treatment. If you have had an infection before, treatment may include:  Eating more fruits, vegetables, and grains.  Taking a fiber  supplement.  Taking a live bacteria supplement (probiotic).  Taking medicine to relax your colon. HOME CARE INSTRUCTIONS   Drink at least 6-8 glasses of water each day to prevent constipation.  Try not to strain when you have a bowel movement.  Keep all follow-up appointments. If you have had an infection before:  Increase the fiber in your diet as directed by your health care provider or dietitian.  Take a dietary fiber supplement if your health care provider approves.  Only take medicines as directed by your health care provider. SEEK MEDICAL CARE IF:   You have abdominal pain.  You have bloating.  You have cramps.  You have not gone to the bathroom in 3 days. SEEK IMMEDIATE MEDICAL CARE IF:   Your pain gets worse.  Yourbloating becomes very bad.  You have a fever or chills, and your symptoms suddenly get worse.  You begin vomiting.  You have bowel movements that are bloody or black. MAKE SURE YOU:  Understand these instructions.  Will watch your condition.  Will get help right away if you are not doing well or get worse.

## 2015-06-08 NOTE — Op Note (Signed)
[facility] [address] [place], [address]   COLONOSCOPY PROCEDURE REPORT  PATIENT: Benjamin [name], Benjamin [name]  MR#: [id number] BIRTHDATE: Mar 01, 1951 , 1  yrs. old GENDER: male ENDOSCOPIST: R.  Garfield Cornea, MD FACP [facility] REFERRED [id number] [name], PA-C PROCEDURE DATE:  2015/06/23 PROCEDURE:   Colonoscopy, surveillance INDICATIONS:history of large adenoma requiring right hemicolectomy last year. MEDICATIONS: Versed 5 mg IV and Demerol 75 mg IV in divided doses. Zofran 4 mg IV. ASA CLASS:       Class II  CONSENT: The risks, benefits, alternatives and imponderables including but not limited to bleeding, perforation as well as the possibility of a missed lesion have been reviewed.  The potential for biopsy, lesion removal, etc. have also been discussed. Questions have been answered.  All parties agreeable.  Please see the history and physical in the medical record for more information.  DESCRIPTION OF PROCEDURE:   After the risks benefits and alternatives of the procedure were thoroughly explained, informed consent was obtained.  The digital rectal exam revealed no abnormalities of the rectum.   The EC-[id number] ([id number])  endoscope was introduced through the anus and advanced to the surgical anastomosis. No adverse events experienced.   The quality of the prep was adequate  The instrument was then slowly withdrawn as the colon was fully examined. Estimated blood loss is zero unless otherwise noted in this procedure report.      COLON FINDINGS: Anal papilla and internal hemorrhoids; otherwise, normal-appearing rectal mucosa.  Residual pancolonic diverticulosis.  Surgical anastomosis identified.  The distal 5 cm of the neoterminal ileum appeared normal as did the residual colonic mucosa side from diverticula.  Retroflexion was performed. .  Withdrawal time=not applicable?"no cecum     .  The scope was withdrawn and the procedure completed. COMPLICATIONS: There were  no immediate complications.  ENDOSCOPIC IMPRESSION: Status post right hemicolectomy. Colonic diverticulosis  RECOMMENDATIONS: repeat colonoscopy in 5 years  eSigned:  R. Garfield Cornea, MD Rosalita Chessman [facility] 23-Jun-2015 9:34 AM   cc:  CPT CODES: ICD CODES:  The ICD and CPT codes recommended by this software are interpretations from the data that the clinical staff has captured with the software.  The verification of the translation of this report to the ICD and CPT codes and modifiers is the sole responsibility of the health care institution and practicing physician where this report was generated.  [employer]. will not be held responsible for the validity of the ICD and CPT codes included on this report.  AMA assumes no liability for data contained or not contained herein. CPT is a Designer, television/film set of the [employer].  PATIENT NAME:  [name], [name] MR#: [id number]

## 2015-06-08 NOTE — H&P (View-Only) (Signed)
  Referring Provider: Mann, Benjamin L, PA-C Primary Care Physician:  MANN, BENJAMIN, PA-C Primary GI:  Dr. Rourk  Chief Complaint  Patient presents with  . Follow-up    HPI:   63-year-old male presents for follow-up surveillance colonoscopy. He has history of a large sessile polyp which was debulked a couple times endoscopically however was unable to have complete removal. Last colonoscopy 03/01/2014 with GI bleed found fresh blood clot in the lumen the rectum and the cecum and the site of prior ascending colon polypectomy identified with the site ulcer crater having a visible bleeding vessel. 6 clips were employed and epinephrine injection used for hemostasis. Patient subsequently followed up with surgery for an elective right hemicolectomy. Surgery was completed by Dr. Ingram in [place] on 04/16/2014. Final postop pathology found to adenomatous polyps and the tattooed site was well within the specimen. Surgery was tolerated well.  Today he states he is doing well. He has occasionally cramping in his upper abdomen/chest but when he bends, which resolves when he stands up. See's cardiology last visit 02/08/15 but he did not mention this to his cardiologist. Upon further discussion he states it is not a pain or pressure but a "muscle twitching." Denies any shortness of breath, diaphoresis at the time. Muscle twitching resolved promptly when he stands upright. Doing well otherwise, denies abdominal pain, N/V, diarrhea, hematochezia, melena. Denies dizziness, lightheadedness, syncope, near syncope. Denies any other upper or lower GI symptoms.  Past Medical History  Diagnosis Date  . Essential hypertension, benign   . Type 2 diabetes mellitus   . Mixed hyperlipidemia     Statin intolerance  . Gout   . Sleep apnea     Possible, uses oxygen at bedtime at times. no formal sleep study.  . Rotator cuff tear     Chronic pain  . Tubular adenoma   . Hypertensive heart disease     LVH with grade  2 diastolic dysfunction  . Arthritis   . CHF (congestive heart failure)     diastolic CHF    Past Surgical History  Procedure Laterality Date  . Appendectomy    . Colonoscopy N/A 10/29/2013    Dr. Rourk- sigmoid polyp status post cold snare removal. large sessile ascending colon polyp. debulked with saline assisted snare polypectomy . pancolonic diverticulosis. inadequate prep. tubular adenoma on bx  . Colonoscopy N/A 02/22/2014    Dr. Rourk: Saline-assisted snare polypectomy/biopsy for sprawling carpet polyp in the ascending colon which could not be completely removed. Status post biopsy (tubular adenoma), tattooed  . Esophagogastroduodenoscopy N/A 02/22/2014    Dr. Rourk: Erosive reflux esophagitis. Schatzki ring status post dilation. Gastric and duodenal erosions with benign biopsies.  . Savory dilation N/A 02/22/2014    Procedure: SAVORY DILATION;  Surgeon: Robert M Rourk, MD;  Location: [facility] ENDO SUITE;  Service: Endoscopy;  Laterality: N/A;  . Maloney dilation N/A 02/22/2014    Procedure: MALONEY DILATION;  Surgeon: Robert M Rourk, MD;  Location: [facility] ENDO SUITE;  Service: Endoscopy;  Laterality: N/A;  . Colonoscopy N/A 03/01/2014    RMR:post polypectomy hemorrhage s/p bleeding  . Laparoscopic right colectomy N/A 04/16/2014    Procedure: LAPAROSCOPIC ASSISTED RIGHT COLECTOMY;  Surgeon: Haywood M Ingram, MD;  Location: [facility] OR;  Service: General;  Laterality: N/A;    Current Outpatient Prescriptions  Medication Sig Dispense Refill  . allopurinol (ZYLOPRIM) 100 MG tablet Take 100 mg by mouth daily.    . amLODipine (NORVASC) 10 MG tablet Take 1 tablet (10   mg total) by mouth daily. 30 tablet 6  . CINNAMON PO Take 1 tablet by mouth daily.    . diazepam (VALIUM) 10 MG tablet Take 10 mg by mouth every 6 (six) hours as needed for anxiety.    . docusate sodium (COLACE) 100 MG capsule Take 100 mg by mouth daily.    . glimepiride (AMARYL) 4 MG tablet Take 4 mg by mouth 2 (two) times daily.    .  hydrochlorothiazide (HYDRODIURIL) 25 MG tablet TAKE ONE TABLET BY MOUTH ONCE DAILY 30 tablet 6  . losartan (COZAAR) 100 MG tablet Take 100 mg by mouth daily.    . Menthol-Methyl Salicylate (ICY HOT BALM EXTRA STRENGTH EX) Apply 1 application topically at bedtime as needed (to shoulder).    . metFORMIN (GLUCOPHAGE) 500 MG tablet Take 500 mg by mouth 2 (two) times daily with a meal.     . metoprolol (LOPRESSOR) 100 MG tablet Take 1 tablet (100 mg total) by mouth 2 (two) times daily. 60 tablet 6  . NIACIN PO Take 1 tablet by mouth daily.    . Omega-3 Fatty Acids (FISH OIL PO) Take 1 capsule by mouth daily.    . pantoprazole (PROTONIX) 40 MG tablet Take 1 tablet (40 mg total) by mouth daily. 30 tablet 11  . potassium chloride SA (K-DUR,KLOR-CON) 20 MEQ tablet Take 20 mEq by mouth daily.    . Red Yeast Rice Extract (RED YEAST RICE PO) Take 1 tablet by mouth daily.     No current facility-administered medications for this visit.    Allergies as of 05/11/2015 - Review Complete 02/08/2015  Allergen Reaction Noted  . Niacin-lovastatin er Other (See Comments) 10/18/2011  . Statins  04/09/2014  . Zocor [simvastatin - high dose] Other (See Comments) 10/18/2011    Family History  Problem Relation Age of Onset  . Diabetes type II Mother   . Colon cancer Neg Hx     Social History   Social History  . Marital Status: Married    Spouse Name: N/A  . Number of Children: 2  . Years of Education: N/A   Occupational History  . Electrician   .     Social History Main Topics  . Smoking status: Former Smoker    Types: Cigarettes    Start date: 09/17/1961    Quit date: 09/18/1975  . Smokeless tobacco: Never Used     Comment: quit 35 years ago  . Alcohol Use: No     Comment: None in over a year. prior to that 1-2 beers couple times per week.  . Drug Use: No  . Sexual Activity: Not on file   Other Topics Concern  . Not on file   Social History Narrative    Review of Systems: General:  Negative for anorexia, weight loss, fever, chills, fatigue, weakness. CV: Negative for chest pain, angina, palpitations,  peripheral edema.  Respiratory: Negative for dyspnea at rest, dyspnea on exertion, cough, sputum, wheezing.  GI: See history of present illness. Endo: Negative for unusual weight change.  Heme: Negative for bruising or bleeding.   Physical Exam: BP 142/89 mmHg  Pulse 50  Temp(Src) 98.3 F (36.8 C) (Oral)  Ht 6' 1" (1.854 m)  Wt 255 lb (115.667 kg)  BMI 33.65 kg/m2 General:   Alert and oriented. Pleasant and cooperative. Well-nourished and well-developed.  Head:  Normocephalic and atraumatic. Eyes:  Without icterus, sclera clear and conjunctiva pink.  Ears:  Normal auditory acuity. Cardiovascular:  S1, S2 present   without murmurs appreciated. Normal pulses noted. Extremities without clubbing or edema. Respiratory:  Clear to auscultation bilaterally. No wheezes, rales, or rhonchi. No distress.  Gastrointestinal:  +BS, rounded, soft, non-tender and non-distended. Well-healed midline incisional scar noted. No HSM noted. No guarding or rebound. No masses appreciated.  Rectal:  Deferred  Neurologic:  Alert and oriented x4;  grossly normal neurologically. Psych:  Alert and cooperative. Normal mood and affect. Heme/Lymph/Immune: No excessive bruising noted.    05/11/2015 9:14 AM  

## 2015-06-08 NOTE — Interval H&P Note (Signed)
History and Physical Interval Note:  06/08/2015 9:02 AM  Benjamin [name]  has presented today for surgery, with the diagnosis of follow up status post right hemicolectomy  The various methods of treatment have been discussed with the patient and family. After consideration of risks, benefits and other options for treatment, the patient has consented to  Procedure(s) with comments: COLONOSCOPY (N/A) - 0900 as a surgical intervention .  The patient's history has been reviewed, patient examined, no change in status, stable for surgery.  I have reviewed the patient's chart and labs.  Questions were answered to the patient's satisfaction.     Benjamin [name]  No change. Surveillance colonoscopy per plan.The risks, benefits, limitations, alternatives and imponderables have been reviewed with the patient. Questions have been answered. All parties are agreeable.

## 2015-06-15 ENCOUNTER — Encounter (HOSPITAL_COMMUNITY): Payer: Self-pay | Admitting: Internal Medicine

## 2015-07-28 DIAGNOSIS — Z6833 Body mass index (BMI) 33.0-33.9, adult: Secondary | ICD-10-CM | POA: Diagnosis not present

## 2015-07-28 DIAGNOSIS — Z1389 Encounter for screening for other disorder: Secondary | ICD-10-CM | POA: Diagnosis not present

## 2015-07-28 DIAGNOSIS — E6609 Other obesity due to excess calories: Secondary | ICD-10-CM | POA: Diagnosis not present

## 2015-07-28 DIAGNOSIS — E782 Mixed hyperlipidemia: Secondary | ICD-10-CM | POA: Diagnosis not present

## 2015-07-28 DIAGNOSIS — E1129 Type 2 diabetes mellitus with other diabetic kidney complication: Secondary | ICD-10-CM | POA: Diagnosis not present

## 2015-08-09 ENCOUNTER — Encounter: Payer: Self-pay | Admitting: Adult Health

## 2015-08-09 ENCOUNTER — Ambulatory Visit (INDEPENDENT_AMBULATORY_CARE_PROVIDER_SITE_OTHER): Payer: Commercial Managed Care - HMO | Admitting: Adult Health

## 2015-08-09 VITALS — BP 124/74 | HR 71 | Ht 74.0 in | Wt 264.4 lb

## 2015-08-09 DIAGNOSIS — I10 Essential (primary) hypertension: Secondary | ICD-10-CM | POA: Diagnosis not present

## 2015-08-09 DIAGNOSIS — E7800 Pure hypercholesterolemia, unspecified: Secondary | ICD-10-CM | POA: Diagnosis not present

## 2015-08-09 NOTE — Progress Notes (Deleted)
Name: Benjamin [name]    DOB: 08/18/1951  Age: 64 y.o.  MR#: [id number]       PCP:  [name], PA-C      Insurance: Payor: Mcarthur Rossetti MEDICARE / Plan: [place] THN/NTSP / Product Type: *No Product type* /   CC:   No chief complaint on file.   VS Filed Vitals:   08/09/15 1434  BP: 124/74  Pulse: 71  Height: 6\' 2"  (1.88 m)  Weight: 264 lb 6.4 oz (119.931 kg)  SpO2: 95%    Weights Current Weight  08/09/15 264 lb 6.4 oz (119.931 kg)  06/08/15 250 lb (113.399 kg)  05/11/15 255 lb (115.667 kg)    Blood Pressure  BP Readings from Last 3 Encounters:  08/09/15 124/74  06/08/15 124/67  05/11/15 142/89     Admit date:  (Not on file) Last encounter with RMR:  Visit date not found   Allergy Niacin-lovastatin er; Statins; and Zocor  Current Outpatient Prescriptions  Medication Sig Dispense Refill  . allopurinol (ZYLOPRIM) 100 MG tablet Take 100 mg by mouth daily.    [name] amLODipine (NORVASC) 10 MG tablet Take 1 tablet (10 mg total) by mouth daily. 30 tablet 6  . CINNAMON PO Take 1 tablet by mouth daily.    . diazepam (VALIUM) 10 MG tablet Take 10 mg by mouth every 6 (six) hours as needed for anxiety.    [name] glimepiride (AMARYL) 4 MG tablet Take 4 mg by mouth 2 (two) times daily.    . hydrochlorothiazide (HYDRODIURIL) 25 MG tablet TAKE ONE TABLET BY MOUTH ONCE DAILY 30 tablet 6  . losartan (COZAAR) 100 MG tablet Take 100 mg by mouth daily.    . metoprolol (LOPRESSOR) 100 MG tablet Take 1 tablet (100 mg total) by mouth 2 (two) times daily. 60 tablet 6  . NIACIN PO Take 1 tablet by mouth daily.    . Omega-3 Fatty Acids (FISH OIL PO) Take 1 capsule by mouth daily.    . pantoprazole (PROTONIX) 40 MG tablet Take 1 tablet (40 mg total) by mouth daily. 30 tablet 11  . potassium chloride SA (K-DUR,KLOR-CON) 20 MEQ tablet Take 20 mEq by mouth daily.    . Red Yeast Rice Extract (RED YEAST RICE PO) Take 1 tablet by mouth daily.    . Menthol-Methyl Salicylate (ICY HOT BALM EXTRA STRENGTH  EX) Apply 1 application topically at bedtime as needed (to shoulder).    . ONE TOUCH ULTRA TEST test strip     . TRULICITY [id number] SOPN      No current facility-administered medications for this visit.    Discontinued Meds:    Medications Discontinued During This Encounter  Medication Reason  . docusate sodium (COLACE) 100 MG capsule Error  . metFORMIN (GLUCOPHAGE) 500 MG tablet Error  . polyethylene glycol-electrolytes (NULYTELY/GOLYTELY) 420 G solution Error    Patient Active Problem List   Diagnosis Date Noted  . History of colonic polyps   . Diverticulosis of colon without hemorrhage   . Adenomatous colon polyp 04/16/2014  . Preoperative cardiovascular examination 03/24/2014  . Hypertensive heart disease 03/24/2014  . Personal history of colonic polyps 03/04/2014  . Rectal bleeding 03/01/2014  . Diverticulosis of colon 03/01/2014  . Gout 12/26/2012  . Obesity 12/20/2012  . Essential hypertension, benign 10/22/2011  . Sleep apnea 10/22/2011  . Hyperlipidemia 10/22/2011  . Type II or unspecified type diabetes mellitus without mention of complication, uncontrolled 10/22/2011    LABS  Component Value Date/Time   NA 142 04/20/2014 0508   NA 142 04/19/2014 0500   NA 134* 04/18/2014 1027   K 3.6* 04/20/2014 0508   K 3.2* 04/19/2014 0500   K 3.9 04/18/2014 1027   CL 101 04/20/2014 0508   CL 99 04/19/2014 0500   CL 96 04/18/2014 1027   CO2 28 04/20/2014 0508   CO2 31 04/19/2014 0500   CO2 27 04/18/2014 1027   GLUCOSE 111* 04/20/2014 0508   GLUCOSE 104* 04/19/2014 0500   GLUCOSE 193* 04/18/2014 1027   BUN 12 04/20/2014 0508   BUN 8 04/19/2014 0500   BUN 10 04/18/2014 1027   CREATININE 1.14 04/20/2014 0508   CREATININE 1.16 04/19/2014 0500   CREATININE 1.15 04/18/2014 1027   CREATININE 1.33 02/13/2013 1535   CREATININE 1.35 01/16/2013 1502   CREATININE 1.10 10/22/2011 1050   CALCIUM 9.0 04/20/2014 0508   CALCIUM 9.0 04/19/2014 0500   CALCIUM 8.4  04/18/2014 1027   GFRNONAA 66* 04/20/2014 0508   GFRNONAA 65* 04/19/2014 0500   GFRNONAA 65* 04/18/2014 1027   GFRAA 77* 04/20/2014 0508   GFRAA 75* 04/19/2014 0500   GFRAA 76* 04/18/2014 1027   CMP     Component Value Date/Time   NA 142 04/20/2014 0508   K 3.6* 04/20/2014 0508   CL 101 04/20/2014 0508   CO2 28 04/20/2014 0508   GLUCOSE 111* 04/20/2014 0508   BUN 12 04/20/2014 0508   CREATININE 1.14 04/20/2014 0508   CREATININE 1.33 02/13/2013 1535   CALCIUM 9.0 04/20/2014 0508   PROT 7.8 04/12/2014 1018   ALBUMIN 3.9 04/12/2014 1018   AST 26 04/12/2014 1018   ALT 31 04/12/2014 1018   ALKPHOS 105 04/12/2014 1018   BILITOT 0.3 04/12/2014 1018   GFRNONAA 66* 04/20/2014 0508   GFRAA 77* 04/20/2014 0508       Component Value Date/Time   WBC 13.2* 04/19/2014 0500   WBC 14.1* 04/18/2014 1027   WBC 16.0* 04/17/2014 0351   HGB 10.9* 04/19/2014 0500   HGB 10.8* 04/18/2014 1027   HGB 11.7* 04/17/2014 0351   HCT 33.1* 04/19/2014 0500   HCT 33.3* 04/18/2014 1027   HCT 36.0* 04/17/2014 0351   MCV 87.1 04/19/2014 0500   MCV 89.3 04/18/2014 1027   MCV 87.6 04/17/2014 0351    Lipid Panel  No results found for: CHOL, TRIG, HDL, CHOLHDL, VLDL, LDLCALC, LDLDIRECT  ABG No results found for: PHART, PCO2ART, PO2ART, HCO3, TCO2, ACIDBASEDEF, O2SAT   Lab Results  Component Value Date   TSH 0.799 12/20/2012   BNP (last 3 results) No results for input(s): BNP in the last 8760 hours.  ProBNP (last 3 results) No results for input(s): PROBNP in the last 8760 hours.  Cardiac Panel (last 3 results) No results for input(s): CKTOTAL, CKMB, TROPONINI, RELINDX in the last 72 hours.  Iron/TIBC/Ferritin/ %Sat No results found for: IRON, TIBC, FERRITIN, IRONPCTSAT   EKG Orders placed or performed in visit on 08/09/15  . EKG 12-Lead     Prior Assessment and Plan Problem List as of 08/09/2015      Cardiovascular and Mediastinum   Essential hypertension, benign   Last Assessment &  Plan 03/24/2014 Office Visit Written 03/24/2014 11:38 AM by Satira Sark, MD    Blood pressure is controlled today.      Hypertensive heart disease   Last Assessment & Plan 03/24/2014 Office Visit Written 03/24/2014 11:38 AM by Satira Sark, MD    Normal LVEF with severe  LVH and grade 2 diastolic dysfunction in the setting of hypertension. No active heart failure symptoms. Continue medical therapy.        Digestive   Rectal bleeding   Diverticulosis of colon   Adenomatous colon polyp   Last Assessment & Plan 05/11/2015 Office Visit Written 05/11/2015  9:16 AM by Carlis Stable, NP    Patient with a history of adenomatous colon polyps and post-polypectomy bleed. Patient is status post right hemicolectomy for sessile polyp unable to be removed endoscopically. He is doing quite well and is due for one year surveillance colonoscopy. Is asymptomatic from a GI standpoint. At this point we'll proceed with surveillance colonoscopy.  Proceed with TCS with Dr. [name] in near future: the risks, benefits, and alternatives have been discussed with the patient in detail. The patient states understanding and desires to proceed.  The patient is not on any anxiolytics, chronic pain medicines, anticoagulants, antidepressants. Last procedure completed with conscious sedation and this should be adequate for his procedure.      Diverticulosis of colon without hemorrhage     Endocrine   Type II or unspecified type diabetes mellitus without mention of complication, uncontrolled   Last Assessment & Plan 03/24/2014 Office Visit Written 03/24/2014 11:39 AM by Satira Sark, MD    Followed by Dr. Orson Ape.        Other   Sleep apnea   Last Assessment & Plan 09/22/2013 Office Visit Written 09/22/2013  5:19 PM by Lendon Colonel, NP    Does not tolerate CPAP, this is causing significant fatigue during the day and some insomnia.      Hyperlipidemia   Last Assessment & Plan 03/17/2013 Office Visit Written 03/17/2013   1:24 PM by Lendon Colonel, NP    Followed by PCP.  Continue risk management with low cholesterol diet.      Obesity   Last Assessment & Plan 03/17/2013 Office Visit Written 03/17/2013  1:25 PM by Lendon Colonel, NP    I think some of his symptoms are lack of exercise and wt. He is not motivated to increase his activity. There also appears to be a component of low level depression. PCP should address this at his discretion.      Gout   Last Assessment & Plan 12/26/2012 Office Visit Written 12/26/2012 10:07 AM by Josue Hector, MD    Discussed with Dr Orson Ape  Would start colchicine and tramadol  Try to avoid prednisone as his BS is already poorly controlled. Dr Orson Ape to see today      Personal history of colonic polyps   Preoperative cardiovascular examination   Last Assessment & Plan 03/24/2014 Office Visit Edited 04/08/2014  8:15 AM by Satira Sark, MD    Patient being considered for laparoscopic-assisted right colectomy with Dr. Dalbert Batman as noted above. Blood pressure is very well controlled on the current regimen. He is clinically stable without active heart failure symptoms or chest pain, and is cleared to proceed with surgery at an acceptable perioperative cardiac risk. He should continue his antihypertensive regimen throughout.      History of colonic polyps       Imaging: No results found.

## 2015-08-09 NOTE — Progress Notes (Signed)
Cardiology Office Note   Date:  08/09/2015   ID:  Olive, Chakrabarti 04-17-1951, MRN [id number]  PCP:  [name], PA-C  Cardiologist:  [place]/  Jory Sims, NP   Chief Complaint  Patient presents with  . Hypertension      History of Present Illness: Benjamin [name] is a 64 y.o. male who presents for ongoing assessment and management of hypertension, chronic diastolic CHF, grade 2 diastolic dysfunction, mixed hyperlipidemia, and history of obstructive sleep apnea but cannot tolerate CPAP.[name]    He comes today without cardiac complaints. Continues generalized weakness and trouble sleeping. He states he is under stress and is depressed. He has gained about 14 lbs since being seen last, but denies fluid retention or dyspnea.   Past Medical History  Diagnosis Date  . Essential hypertension, benign   . Type 2 diabetes mellitus ([place])   . Mixed hyperlipidemia     Statin intolerance  . Gout   . Sleep apnea     Possible, uses oxygen at bedtime at times. no formal sleep study.  . Rotator cuff tear     Chronic pain  . Tubular adenoma   . Hypertensive heart disease     LVH with grade 2 diastolic dysfunction  . Arthritis   . CHF (congestive heart failure) (HCC)     diastolic CHF    Past Surgical History  Procedure Laterality Date  . Appendectomy    . Colonoscopy N/A 10/29/2013    Dr. [name]- sigmoid polyp status post cold snare removal. large sessile ascending colon polyp. debulked with saline assisted snare polypectomy . pancolonic diverticulosis. inadequate prep. tubular adenoma on bx  . Colonoscopy N/A 02/22/2014    Dr. [name]: Saline-assisted snare polypectomy/biopsy for sprawling carpet polyp in the ascending colon which could not be completely removed. Status post biopsy (tubular adenoma), tattooed  . Esophagogastroduodenoscopy N/A 02/22/2014    Dr. [name]: Erosive reflux esophagitis. Schatzki ring status post dilation. Gastric and duodenal erosions with benign biopsies.  Azzie Almas dilation N/A 02/22/2014    Procedure: Azzie Almas DILATION;  Surgeon: [name], MD;  [facility];  Service: Endoscopy;  Laterality: N/A;  [name] dilation N/A 02/22/2014    Procedure: [name] DILATION;  Surgeon: [name], MD;  [facility];  Service: Endoscopy;  Laterality: N/A;  . Colonoscopy N/A 03/01/2014    [id number] polypectomy hemorrhage s/p bleeding  . Laparoscopic right colectomy N/A 04/16/2014    Procedure: LAPAROSCOPIC ASSISTED RIGHT COLECTOMY;  Surgeon: [name], MD;  Location: [facility];  Service: General;  Laterality: N/A;  . Colonoscopy N/A 06/08/2015    Procedure: COLONOSCOPY;  Surgeon: [name], MD;  [facility];  Service: Endoscopy;  Laterality: N/A;  0900     Current Outpatient Prescriptions  Medication Sig Dispense Refill  . allopurinol (ZYLOPRIM) 100 MG tablet Take 100 mg by mouth daily.    [name] amLODipine (NORVASC) 10 MG tablet Take 1 tablet (10 mg total) by mouth daily. 30 tablet 6  . CINNAMON PO Take 1 tablet by mouth daily.    . diazepam (VALIUM) 10 MG tablet Take 10 mg by mouth every 6 (six) hours as needed for anxiety.    [name] glimepiride (AMARYL) 4 MG tablet Take 4 mg by mouth 2 (two) times daily.    . hydrochlorothiazide (HYDRODIURIL) 25 MG tablet TAKE ONE TABLET BY MOUTH ONCE DAILY 30 tablet 6  . losartan (COZAAR) 100 MG tablet Take 100 mg by  mouth daily.    . metoprolol (LOPRESSOR) 100 MG tablet Take 1 tablet (100 mg total) by mouth 2 (two) times daily. 60 tablet 6  . NIACIN PO Take 1 tablet by mouth daily.    . Omega-3 Fatty Acids (FISH OIL PO) Take 1 capsule by mouth daily.    . pantoprazole (PROTONIX) 40 MG tablet Take 1 tablet (40 mg total) by mouth daily. 30 tablet 11  . potassium chloride SA (K-DUR,KLOR-CON) 20 MEQ tablet Take 20 mEq by mouth daily.    . Red Yeast Rice Extract (RED YEAST RICE PO) Take 1 tablet by mouth daily.    . Menthol-Methyl Salicylate (ICY HOT BALM EXTRA STRENGTH EX) Apply 1  application topically at bedtime as needed (to shoulder).    . ONE TOUCH ULTRA TEST test strip     . TRULICITY [id number] 0000000 SOPN      No current facility-administered medications for this visit.    Allergies:   Niacin-lovastatin er; Statins; and Zocor    Social History:  The patient  reports that he quit smoking about 39 years ago. His smoking use included Cigarettes. He started smoking about 53 years ago. He has never used smokeless tobacco. He reports that he does not drink alcohol or use illicit drugs.   Family History:  The patient's family history includes Diabetes type II in his mother. There is no history of Colon cancer.    ROS: All other systems are reviewed and negative. Unless otherwise mentioned in H&P    PHYSICAL EXAM: VS:  BP 124/74 mmHg  Pulse 71  Ht 6\' 2"  (1.88 m)  Wt 264 lb 6.4 oz (119.931 kg)  BMI 33.93 kg/m2  SpO2 95% , BMI Body mass index is 33.93 kg/(m^2). GEN: Well nourished, well developed, in no acute distress HEENT: normal Neck: no JVD, carotid bruits, or masses Cardiac: RRR; occasional extra systole, no murmurs, rubs, or gallops,no edema  Respiratory:  clear to auscultation bilaterally, normal work of breathing GI: soft, nontender, nondistended, + BS MS: no deformity or atrophy Skin: warm and dry, no rash Neuro:  Strength and sensation are intact Psych: euthymic mood, full affect   EKG:  SR with short PR interval, PVC's and incomplete RBBB   Recent Labs: No results found for requested labs within last 365 days.    Lipid Panel No results found for: CHOL, TRIG, HDL, CHOLHDL, VLDL, LDLCALC, LDLDIRECT    Wt Readings from Last 3 Encounters:  08/09/15 264 lb 6.4 oz (119.931 kg)  06/08/15 250 lb (113.399 kg)  05/11/15 255 lb (115.667 kg)     ASSESSMENT AND PLAN:  1. Hypertension: Controlled. No changes in medications. He is asked to increase his activity and walk daily to assist in weight loss and overall well being. Low sodium diet.   2.  Hypercholesterolemia: He is to watch his intake of cholesterol rich foods. He is intolerant of statins due to significant myalgias. He is on Red Yeast Rice, Fish OIl and Niacin. Labs have just been drawn by PCP.      Current medicines are reviewed at length with the patient today.    Labs/ tests ordered today include: None Orders Placed This Encounter  Procedures  . EKG 12-Lead     Disposition:   FU with 6 months.  Signed, Jory Sims, NP  08/09/2015 3:44 PM    [facility] [address] [place], [place] [address] Phone: [phone]; Fax: [phone]

## 2015-08-09 NOTE — Patient Instructions (Signed)
Your physician wants you to follow-up in: 6 months with K lawrence NP You will receive a reminder letter in the mail two months in advance. If you don't receive a letter, please call our office to schedule the follow-up appointment.    Your physician recommends that you continue on your current medications as directed. Please refer to the Current Medication list given to you today.     If you need a refill on your cardiac medications before your next appointment, please call your pharmacy.     Thank you for choosing [facility] !        

## 2015-08-31 DIAGNOSIS — E781 Pure hyperglyceridemia: Secondary | ICD-10-CM | POA: Diagnosis not present

## 2015-09-13 ENCOUNTER — Telehealth: Payer: Self-pay | Admitting: Internal Medicine

## 2015-09-13 MED ORDER — PANTOPRAZOLE SODIUM 40 MG PO TBEC: 40.0000 mg | DELAYED_RELEASE_TABLET | Freq: Every day | ORAL | Status: DC

## 2015-09-13 NOTE — Telephone Encounter (Signed)
Pt needs refills on his protonix and uses [facility] in [place], He got the prescription while he was in the hospital with 12 refills, but [facility]MR wasn't the prescribing doctor and they weren't sure who to call to get refills. Please advise. [id number]

## 2015-09-13 NOTE — Telephone Encounter (Signed)
Completed.

## 2015-09-13 NOTE — Telephone Encounter (Signed)
Routing to the refill box. 

## 2015-09-13 NOTE — Addendum Note (Signed)
Addended by: Orvil Feil on: 09/13/2015 03:44 PM   Modules accepted: Orders

## 2015-11-01 DIAGNOSIS — I10 Essential (primary) hypertension: Secondary | ICD-10-CM | POA: Diagnosis not present

## 2015-11-01 DIAGNOSIS — Z0000 Encounter for general adult medical examination without abnormal findings: Secondary | ICD-10-CM | POA: Diagnosis not present

## 2015-11-01 DIAGNOSIS — E1129 Type 2 diabetes mellitus with other diabetic kidney complication: Secondary | ICD-10-CM | POA: Diagnosis not present

## 2015-11-01 DIAGNOSIS — Z6833 Body mass index (BMI) 33.0-33.9, adult: Secondary | ICD-10-CM | POA: Diagnosis not present

## 2015-11-01 DIAGNOSIS — Z1389 Encounter for screening for other disorder: Secondary | ICD-10-CM | POA: Diagnosis not present

## 2015-11-01 DIAGNOSIS — E6609 Other obesity due to excess calories: Secondary | ICD-10-CM | POA: Diagnosis not present

## 2016-02-09 ENCOUNTER — Ambulatory Visit (INDEPENDENT_AMBULATORY_CARE_PROVIDER_SITE_OTHER): Payer: Commercial Managed Care - HMO | Admitting: Adult Health

## 2016-02-09 ENCOUNTER — Encounter: Payer: Self-pay | Admitting: Adult Health

## 2016-02-09 VITALS — BP 150/90 | HR 57 | Ht 74.0 in | Wt 265.0 lb

## 2016-02-09 DIAGNOSIS — I5032 Chronic diastolic (congestive) heart failure: Secondary | ICD-10-CM

## 2016-02-09 DIAGNOSIS — I10 Essential (primary) hypertension: Secondary | ICD-10-CM

## 2016-02-09 MED ORDER — AMLODIPINE BESYLATE 10 MG PO TABS: 10.0000 mg | ORAL_TABLET | Freq: Every day | ORAL | Status: DC

## 2016-02-09 MED ORDER — POTASSIUM CHLORIDE CRYS ER 20 MEQ PO TBCR: 20.0000 meq | EXTENDED_RELEASE_TABLET | Freq: Every day | ORAL | Status: DC

## 2016-02-09 MED ORDER — LOSARTAN POTASSIUM 100 MG PO TABS: 100.0000 mg | ORAL_TABLET | Freq: Every day | ORAL | Status: DC

## 2016-02-09 MED ORDER — METOPROLOL TARTRATE 100 MG PO TABS: 100.0000 mg | ORAL_TABLET | Freq: Two times a day (BID) | ORAL | Status: DC

## 2016-02-09 NOTE — Progress Notes (Deleted)
Name: Benjamin [name]    DOB: 1951/03/01  Age: 65 y.o.  MR#: [id number]       PCP:  [name], PA-C      Insurance: Payor: Mcarthur Rossetti MEDICARE / Plan: [place] THN/NTSP / Product Type: *No Product type* /   CC:   No chief complaint on file.   VS Filed Vitals:   02/09/16 1256  BP: 150/90  Pulse: 57  Height: 6\' 2"  (1.88 m)  Weight: 265 lb (120.203 kg)  SpO2: 93%    Weights Current Weight  02/09/16 265 lb (120.203 kg)  08/09/15 264 lb 6.4 oz (119.931 kg)  06/08/15 250 lb (113.399 kg)    Blood Pressure  BP Readings from Last 3 Encounters:  02/09/16 150/90  08/09/15 124/74  06/08/15 124/67     Admit date:  (Not on file) Last encounter with RMR:  Visit date not found   Allergy Niacin-lovastatin er; Statins; and Zocor  Current Outpatient Prescriptions  Medication Sig Dispense Refill  . allopurinol (ZYLOPRIM) 100 MG tablet Take 100 mg by mouth daily.    [name] amLODipine (NORVASC) 10 MG tablet Take 1 tablet (10 mg total) by mouth daily. 30 tablet 6  . CINNAMON PO Take 1 tablet by mouth daily.    . diazepam (VALIUM) 10 MG tablet Take 10 mg by mouth every 6 (six) hours as needed for anxiety.    . hydrochlorothiazide (HYDRODIURIL) 25 MG tablet TAKE ONE TABLET BY MOUTH ONCE DAILY 30 tablet 6  . insulin glargine (LANTUS) 100 UNIT/ML injection Inject 45 Units into the skin 2 (two) times daily.    [name] losartan (COZAAR) 100 MG tablet Take 100 mg by mouth daily.    . Menthol-Methyl Salicylate (ICY HOT BALM EXTRA STRENGTH EX) Apply 1 application topically at bedtime as needed (to shoulder).    . metoprolol (LOPRESSOR) 100 MG tablet Take 1 tablet (100 mg total) by mouth 2 (two) times daily. 60 tablet 6  . NIACIN PO Take 1 tablet by mouth daily.    . Omega-3 Fatty Acids (FISH OIL PO) Take 1 capsule by mouth daily.    . ONE TOUCH ULTRA TEST test strip     . pantoprazole (PROTONIX) 40 MG tablet Take 1 tablet (40 mg total) by mouth daily. 30 tablet 11  . potassium chloride SA  (K-DUR,KLOR-CON) 20 MEQ tablet Take 20 mEq by mouth daily.    . Red Yeast Rice Extract (RED YEAST RICE PO) Take 1 tablet by mouth daily.     No current facility-administered medications for this visit.    Discontinued Meds:    Medications Discontinued During This Encounter  Medication Reason  . TRULICITY A999333 0000000 SOPN Error  . glimepiride (AMARYL) 4 MG tablet Error    Patient Active Problem List   Diagnosis Date Noted  . History of colonic polyps   . Diverticulosis of colon without hemorrhage   . Adenomatous colon polyp 04/16/2014  . Preoperative cardiovascular examination 03/24/2014  . Hypertensive heart disease 03/24/2014  . Personal history of colonic polyps 03/04/2014  . Rectal bleeding 03/01/2014  . Diverticulosis of colon 03/01/2014  . Gout 12/26/2012  . Obesity 12/20/2012  . Essential hypertension, benign 10/22/2011  . Sleep apnea 10/22/2011  . Hyperlipidemia 10/22/2011  . Type II or unspecified type diabetes mellitus without mention of complication, uncontrolled 10/22/2011    LABS    Component Value Date/Time   NA 142 04/20/2014 0508   NA 142 04/19/2014 0500   NA 134* 04/18/2014  1027   K 3.6* 04/20/2014 0508   K 3.2* 04/19/2014 0500   K 3.9 04/18/2014 1027   CL 101 04/20/2014 0508   CL 99 04/19/2014 0500   CL 96 04/18/2014 1027   CO2 28 04/20/2014 0508   CO2 31 04/19/2014 0500   CO2 27 04/18/2014 1027   GLUCOSE 111* 04/20/2014 0508   GLUCOSE 104* 04/19/2014 0500   GLUCOSE 193* 04/18/2014 1027   BUN 12 04/20/2014 0508   BUN 8 04/19/2014 0500   BUN 10 04/18/2014 1027   CREATININE 1.14 04/20/2014 0508   CREATININE 1.16 04/19/2014 0500   CREATININE 1.15 04/18/2014 1027   CREATININE 1.33 02/13/2013 1535   CREATININE 1.35 01/16/2013 1502   CREATININE 1.10 10/22/2011 1050   CALCIUM 9.0 04/20/2014 0508   CALCIUM 9.0 04/19/2014 0500   CALCIUM 8.4 04/18/2014 1027   GFRNONAA 66* 04/20/2014 0508   GFRNONAA 65* 04/19/2014 0500   GFRNONAA 65* 04/18/2014  1027   GFRAA 77* 04/20/2014 0508   GFRAA 75* 04/19/2014 0500   GFRAA 76* 04/18/2014 1027   CMP     Component Value Date/Time   NA 142 04/20/2014 0508   K 3.6* 04/20/2014 0508   CL 101 04/20/2014 0508   CO2 28 04/20/2014 0508   GLUCOSE 111* 04/20/2014 0508   BUN 12 04/20/2014 0508   CREATININE 1.14 04/20/2014 0508   CREATININE 1.33 02/13/2013 1535   CALCIUM 9.0 04/20/2014 0508   PROT 7.8 04/12/2014 1018   ALBUMIN 3.9 04/12/2014 1018   AST 26 04/12/2014 1018   ALT 31 04/12/2014 1018   ALKPHOS 105 04/12/2014 1018   BILITOT 0.3 04/12/2014 1018   GFRNONAA 66* 04/20/2014 0508   GFRAA 77* 04/20/2014 0508       Component Value Date/Time   WBC 13.2* 04/19/2014 0500   WBC 14.1* 04/18/2014 1027   WBC 16.0* 04/17/2014 0351   HGB 10.9* 04/19/2014 0500   HGB 10.8* 04/18/2014 1027   HGB 11.7* 04/17/2014 0351   HCT 33.1* 04/19/2014 0500   HCT 33.3* 04/18/2014 1027   HCT 36.0* 04/17/2014 0351   MCV 87.1 04/19/2014 0500   MCV 89.3 04/18/2014 1027   MCV 87.6 04/17/2014 0351    Lipid Panel  No results found for: CHOL, TRIG, HDL, CHOLHDL, VLDL, LDLCALC, LDLDIRECT  ABG No results found for: PHART, PCO2ART, PO2ART, HCO3, TCO2, ACIDBASEDEF, O2SAT   Lab Results  Component Value Date   TSH 0.799 12/20/2012   BNP (last 3 results) No results for input(s): BNP in the last 8760 hours.  ProBNP (last 3 results) No results for input(s): PROBNP in the last 8760 hours.  Cardiac Panel (last 3 results) No results for input(s): CKTOTAL, CKMB, TROPONINI, RELINDX in the last 72 hours.  Iron/TIBC/Ferritin/ %Sat No results found for: IRON, TIBC, FERRITIN, IRONPCTSAT   EKG Orders placed or performed in visit on 08/09/15  . EKG 12-Lead     Prior Assessment and Plan Problem List as of 02/09/2016      Cardiovascular and Mediastinum   Essential hypertension, benign   Last Assessment & Plan 03/24/2014 Office Visit Written 03/24/2014 11:38 AM by Satira Sark, MD    Blood pressure is  controlled today.      Hypertensive heart disease   Last Assessment & Plan 03/24/2014 Office Visit Written 03/24/2014 11:38 AM by Satira Sark, MD    Normal LVEF with severe LVH and grade 2 diastolic dysfunction in the setting of hypertension. No active heart failure symptoms. Continue medical therapy.  Digestive   Rectal bleeding   Diverticulosis of colon   Adenomatous colon polyp   Last Assessment & Plan 05/11/2015 Office Visit Written 05/11/2015  9:16 AM by Carlis Stable, NP    Patient with a history of adenomatous colon polyps and post-polypectomy bleed. Patient is status post right hemicolectomy for sessile polyp unable to be removed endoscopically. He is doing quite well and is due for one year surveillance colonoscopy. Is asymptomatic from a GI standpoint. At this point we'll proceed with surveillance colonoscopy.  Proceed with TCS with Dr. [name] in near future: the risks, benefits, and alternatives have been discussed with the patient in detail. The patient states understanding and desires to proceed.  The patient is not on any anxiolytics, chronic pain medicines, anticoagulants, antidepressants. Last procedure completed with conscious sedation and this should be adequate for his procedure.      Diverticulosis of colon without hemorrhage     Endocrine   Type II or unspecified type diabetes mellitus without mention of complication, uncontrolled   Last Assessment & Plan 03/24/2014 Office Visit Written 03/24/2014 11:39 AM by Satira Sark, MD    Followed by Dr. Orson Ape.        Other   Sleep apnea   Last Assessment & Plan 09/22/2013 Office Visit Written 09/22/2013  5:19 PM by Lendon Colonel, NP    Does not tolerate CPAP, this is causing significant fatigue during the day and some insomnia.      Hyperlipidemia   Last Assessment & Plan 03/17/2013 Office Visit Written 03/17/2013  1:24 PM by Lendon Colonel, NP    Followed by PCP.  Continue risk management with low cholesterol  diet.      Obesity   Last Assessment & Plan 03/17/2013 Office Visit Written 03/17/2013  1:25 PM by Lendon Colonel, NP    I think some of his symptoms are lack of exercise and wt. He is not motivated to increase his activity. There also appears to be a component of low level depression. PCP should address this at his discretion.      Gout   Last Assessment & Plan 12/26/2012 Office Visit Written 12/26/2012 10:07 AM by Josue Hector, MD    Discussed with Dr Orson Ape  Would start colchicine and tramadol  Try to avoid prednisone as his BS is already poorly controlled. Dr Orson Ape to see today      Personal history of colonic polyps   Preoperative cardiovascular examination   Last Assessment & Plan 03/24/2014 Office Visit Edited 04/08/2014  8:15 AM by Satira Sark, MD    Patient being considered for laparoscopic-assisted right colectomy with Dr. Dalbert Batman as noted above. Blood pressure is very well controlled on the current regimen. He is clinically stable without active heart failure symptoms or chest pain, and is cleared to proceed with surgery at an acceptable perioperative cardiac risk. He should continue his antihypertensive regimen throughout.      History of colonic polyps       Imaging: No results found.

## 2016-02-09 NOTE — Patient Instructions (Signed)
Your physician wants you to follow-up in: 6 Months with Dr. Domenic Polite. You will receive a reminder letter in the mail two months in advance. If you don't receive a letter, please call our office to schedule the follow-up appointment.  Your physician recommends that you continue on your current medications as directed. Please refer to the Current Medication list given to you today.  If you need a refill on your cardiac medications before your next appointment, please call your pharmacy.  Have Echo and Sleep Study done at the [facility].   Thank you for choosing [facility]!

## 2016-02-09 NOTE — Progress Notes (Signed)
Cardiology Office Note   Date:  02/09/2016   ID:  Benjamin [name] [birth year], MRN [id number]  PCP:  [name], PA-C  Cardiologist: [place]/  Jory Sims, NP   No chief complaint on file.     History of Present Illness: Benjamin [name] is a 65 y.o. male who presents for ongoing assessment and management of hypertension, chronic diastolic CHF, grade 2 diastolic dysfunction, mixed hyperlipidemia, and history of obstructive sleep apnea but cannot tolerate CPAP.  He comes today without cardiac complaints. He is being followed by the [facility] and has labs completed. He had HCTZ discontinued and started on Chlorthalidone. He does not remember the dose. He is otherwise stable but under some stress.   Past Medical History  Diagnosis Date  . Essential hypertension, benign   . Type 2 diabetes mellitus ([place])   . Mixed hyperlipidemia     Statin intolerance  . Gout   . Sleep apnea     Possible, uses oxygen at bedtime at times. no formal sleep study.  . Rotator cuff tear     Chronic pain  . Tubular adenoma   . Hypertensive heart disease     LVH with grade 2 diastolic dysfunction  . Arthritis   . CHF (congestive heart failure) (HCC)     diastolic CHF    Past Surgical History  Procedure Laterality Date  . Appendectomy    . Colonoscopy N/A 10/29/2013    Dr. [name]- sigmoid polyp status post cold snare removal. large sessile ascending colon polyp. debulked with saline assisted snare polypectomy . pancolonic diverticulosis. inadequate prep. tubular adenoma on bx  . Colonoscopy N/A 02/22/2014    Dr. [name]: Saline-assisted snare polypectomy/biopsy for sprawling carpet polyp in the ascending colon which could not be completely removed. Status post biopsy (tubular adenoma), tattooed  . Esophagogastroduodenoscopy N/A 02/22/2014    Dr. [name]: Erosive reflux esophagitis. Schatzki ring status post dilation. Gastric and duodenal erosions with benign biopsies.  Azzie Almas dilation N/A 02/22/2014   Procedure: Azzie Almas DILATION;  Surgeon: [name], MD;  [facility];  Service: Endoscopy;  Laterality: N/A;  [name] dilation N/A 02/22/2014    Procedure: [name] DILATION;  Surgeon: [name], MD;  [facility];  Service: Endoscopy;  Laterality: N/A;  . Colonoscopy N/A 03/01/2014    [id number] polypectomy hemorrhage s/p bleeding  . Laparoscopic right colectomy N/A 04/16/2014    Procedure: LAPAROSCOPIC ASSISTED RIGHT COLECTOMY;  Surgeon: [name], MD;  Location: [facility];  Service: General;  Laterality: N/A;  . Colonoscopy N/A 06/08/2015    Procedure: COLONOSCOPY;  Surgeon: [name], MD;  [facility];  Service: Endoscopy;  Laterality: N/A;  0900     Current Outpatient Prescriptions  Medication Sig Dispense Refill  . allopurinol (ZYLOPRIM) 100 MG tablet Take 100 mg by mouth daily.    [name] amLODipine (NORVASC) 10 MG tablet Take 1 tablet (10 mg total) by mouth daily. 30 tablet 6  . CINNAMON PO Take 1 tablet by mouth daily.    . diazepam (VALIUM) 10 MG tablet Take 10 mg by mouth every 6 (six) hours as needed for anxiety.    . hydrochlorothiazide (HYDRODIURIL) 25 MG tablet TAKE ONE TABLET BY MOUTH ONCE DAILY 30 tablet 6  . insulin glargine (LANTUS) 100 UNIT/ML injection Inject 45 Units into the skin 2 (two) times daily.    [name] losartan (COZAAR) 100 MG tablet Take 100 mg by mouth daily.    [name]  Menthol-Methyl Salicylate (ICY HOT BALM EXTRA STRENGTH EX) Apply 1 application topically at bedtime as needed (to shoulder).    . metoprolol (LOPRESSOR) 100 MG tablet Take 1 tablet (100 mg total) by mouth 2 (two) times daily. 60 tablet 6  . NIACIN PO Take 1 tablet by mouth daily.    . Omega-3 Fatty Acids (FISH OIL PO) Take 1 capsule by mouth daily.    . ONE TOUCH ULTRA TEST test strip     . pantoprazole (PROTONIX) 40 MG tablet Take 1 tablet (40 mg total) by mouth daily. 30 tablet 11  . potassium chloride SA (K-DUR,KLOR-CON) 20 MEQ tablet Take 20 mEq by mouth daily.     . Red Yeast Rice Extract (RED YEAST RICE PO) Take 1 tablet by mouth daily.     No current facility-administered medications for this visit.    Allergies:   Niacin-lovastatin er; Statins; and Zocor    Social History:  The patient  reports that he quit smoking about 40 years ago. His smoking use included Cigarettes. He started smoking about 54 years ago. He has never used smokeless tobacco. He reports that he does not drink alcohol or use illicit drugs.   Family History:  The patient's family history includes Diabetes type II in his mother. There is no history of Colon cancer.    ROS: All other systems are reviewed and negative. Unless otherwise mentioned in H&P    PHYSICAL EXAM: VS:  BP 150/90 mmHg  Pulse 57  Ht 6\' 2"  (1.88 m)  Wt 265 lb (120.203 kg)  BMI 34.01 kg/m2  SpO2 93% , BMI Body mass index is 34.01 kg/(m^2). GEN: Well nourished, well developed, in no acute distress HEENT: normal Neck: no JVD, carotid bruits, or masses Cardiac: RRR; 1/6 systolic murmur, rubs, or gallops,no edema  Respiratory:  clear to auscultation bilaterally, normal work of breathing GI: soft, nontender, nondistended, + BS MS: no deformity or atrophy Skin: warm and dry, no rash Neuro:  Strength and sensation are intact Psych: euthymic mood, flat affect.   Lipid Panel No results found for: CHOL, TRIG, HDL, CHOLHDL, VLDL, LDLCALC, LDLDIRECT    Wt Readings from Last 3 Encounters:  02/09/16 265 lb (120.203 kg)  08/09/15 264 lb 6.4 oz (119.931 kg)  06/08/15 250 lb (113.399 kg)     ASSESSMENT AND PLAN:  1. Chronic  Diastolic CHF: Weight is maintained, no evidence of fluid overload. He is being followed by the [facility] and has had HCTZ changed to Chlrothalidone but he is uncertain of dose. He has labs completed by the [facility]. He will need a follow up echocardiogram. He wishes to have this done at the [facility] as he will not have to pay for this. I have asked him to have a copy sent to our office.   2.  Hypertension: BP is elevated today. He continues to eat salted foods. I have asked him to be more mindful of the salt. I have asked him to increase his activity by walking daily to decrease weight and manage BP. Refills are provided on antihypertensives.      Current medicines are reviewed at length with the patient today.    Labs/ tests ordered today include:  No orders of the defined types were placed in this encounter.     Disposition:   FU with 6 months.   Signed, Jory Sims, NP  02/09/2016 1:01 PM    [facility] [address], [place], [place] [address] Phone: [phone]; Fax: [phone]

## 2016-11-14 DIAGNOSIS — E1129 Type 2 diabetes mellitus with other diabetic kidney complication: Secondary | ICD-10-CM | POA: Diagnosis not present

## 2016-11-14 DIAGNOSIS — I10 Essential (primary) hypertension: Secondary | ICD-10-CM | POA: Diagnosis not present

## 2016-11-14 DIAGNOSIS — Z0001 Encounter for general adult medical examination with abnormal findings: Secondary | ICD-10-CM | POA: Diagnosis not present

## 2016-11-14 DIAGNOSIS — E1165 Type 2 diabetes mellitus with hyperglycemia: Secondary | ICD-10-CM | POA: Diagnosis not present

## 2016-11-14 DIAGNOSIS — Z1389 Encounter for screening for other disorder: Secondary | ICD-10-CM | POA: Diagnosis not present

## 2016-11-14 DIAGNOSIS — Z6836 Body mass index (BMI) 36.0-36.9, adult: Secondary | ICD-10-CM | POA: Diagnosis not present

## 2016-11-14 DIAGNOSIS — E782 Mixed hyperlipidemia: Secondary | ICD-10-CM | POA: Diagnosis not present

## 2016-11-15 DIAGNOSIS — E1165 Type 2 diabetes mellitus with hyperglycemia: Secondary | ICD-10-CM | POA: Diagnosis not present

## 2016-11-19 DIAGNOSIS — Z0001 Encounter for general adult medical examination with abnormal findings: Secondary | ICD-10-CM | POA: Diagnosis not present

## 2016-11-19 DIAGNOSIS — Z1389 Encounter for screening for other disorder: Secondary | ICD-10-CM | POA: Diagnosis not present

## 2016-11-19 DIAGNOSIS — E782 Mixed hyperlipidemia: Secondary | ICD-10-CM | POA: Diagnosis not present

## 2016-11-19 DIAGNOSIS — Z0000 Encounter for general adult medical examination without abnormal findings: Secondary | ICD-10-CM | POA: Diagnosis not present

## 2017-01-21 ENCOUNTER — Emergency Department (HOSPITAL_COMMUNITY): Payer: Medicare HMO

## 2017-01-21 ENCOUNTER — Inpatient Hospital Stay (HOSPITAL_COMMUNITY)
Admission: EM | Admit: 2017-01-21 | Discharge: 2017-01-23 | DRG: 065 | Disposition: A | Discharge status: Home / Self Care | Payer: Medicare HMO | Attending: Family Medicine | Admitting: Family Medicine

## 2017-01-21 DIAGNOSIS — I679 Cerebrovascular disease, unspecified: Secondary | ICD-10-CM | POA: Diagnosis not present

## 2017-01-21 DIAGNOSIS — E1159 Type 2 diabetes mellitus with other circulatory complications: Secondary | ICD-10-CM

## 2017-01-21 DIAGNOSIS — I639 Cerebral infarction, unspecified: Secondary | ICD-10-CM | POA: Diagnosis not present

## 2017-01-21 DIAGNOSIS — R4701 Aphasia: Secondary | ICD-10-CM

## 2017-01-21 DIAGNOSIS — Z794 Long term (current) use of insulin: Secondary | ICD-10-CM

## 2017-01-21 DIAGNOSIS — Z833 Family history of diabetes mellitus: Secondary | ICD-10-CM | POA: Diagnosis not present

## 2017-01-21 DIAGNOSIS — N183 Chronic kidney disease, stage 3 unspecified: Secondary | ICD-10-CM | POA: Diagnosis present

## 2017-01-21 DIAGNOSIS — D72829 Elevated white blood cell count, unspecified: Secondary | ICD-10-CM | POA: Diagnosis present

## 2017-01-21 DIAGNOSIS — R42 Dizziness and giddiness: Secondary | ICD-10-CM | POA: Diagnosis not present

## 2017-01-21 DIAGNOSIS — N182 Chronic kidney disease, stage 2 (mild): Secondary | ICD-10-CM | POA: Diagnosis present

## 2017-01-21 DIAGNOSIS — R9431 Abnormal electrocardiogram [ECG] [EKG]: Secondary | ICD-10-CM | POA: Diagnosis not present

## 2017-01-21 DIAGNOSIS — E118 Type 2 diabetes mellitus with unspecified complications: Secondary | ICD-10-CM | POA: Diagnosis not present

## 2017-01-21 DIAGNOSIS — E785 Hyperlipidemia, unspecified: Secondary | ICD-10-CM | POA: Diagnosis present

## 2017-01-21 DIAGNOSIS — R404 Transient alteration of awareness: Secondary | ICD-10-CM | POA: Diagnosis not present

## 2017-01-21 DIAGNOSIS — R4781 Slurred speech: Secondary | ICD-10-CM | POA: Diagnosis present

## 2017-01-21 DIAGNOSIS — E1165 Type 2 diabetes mellitus with hyperglycemia: Secondary | ICD-10-CM | POA: Diagnosis present

## 2017-01-21 DIAGNOSIS — E669 Obesity, unspecified: Secondary | ICD-10-CM | POA: Diagnosis present

## 2017-01-21 DIAGNOSIS — I10 Essential (primary) hypertension: Secondary | ICD-10-CM | POA: Diagnosis present

## 2017-01-21 DIAGNOSIS — E119 Type 2 diabetes mellitus without complications: Secondary | ICD-10-CM

## 2017-01-21 DIAGNOSIS — I5032 Chronic diastolic (congestive) heart failure: Secondary | ICD-10-CM | POA: Diagnosis present

## 2017-01-21 DIAGNOSIS — M109 Gout, unspecified: Secondary | ICD-10-CM | POA: Diagnosis present

## 2017-01-21 DIAGNOSIS — E782 Mixed hyperlipidemia: Secondary | ICD-10-CM | POA: Diagnosis present

## 2017-01-21 DIAGNOSIS — N189 Chronic kidney disease, unspecified: Secondary | ICD-10-CM | POA: Diagnosis present

## 2017-01-21 DIAGNOSIS — R29810 Facial weakness: Secondary | ICD-10-CM | POA: Diagnosis not present

## 2017-01-21 DIAGNOSIS — E1122 Type 2 diabetes mellitus with diabetic chronic kidney disease: Secondary | ICD-10-CM | POA: Diagnosis present

## 2017-01-21 DIAGNOSIS — N179 Acute kidney failure, unspecified: Secondary | ICD-10-CM | POA: Diagnosis present

## 2017-01-21 DIAGNOSIS — I6523 Occlusion and stenosis of bilateral carotid arteries: Secondary | ICD-10-CM | POA: Diagnosis not present

## 2017-01-21 DIAGNOSIS — Z87891 Personal history of nicotine dependence: Secondary | ICD-10-CM | POA: Diagnosis not present

## 2017-01-21 DIAGNOSIS — I130 Hypertensive heart and chronic kidney disease with heart failure and stage 1 through stage 4 chronic kidney disease, or unspecified chronic kidney disease: Secondary | ICD-10-CM | POA: Diagnosis not present

## 2017-01-21 DIAGNOSIS — R29818 Other symptoms and signs involving the nervous system: Secondary | ICD-10-CM | POA: Diagnosis not present

## 2017-01-21 LAB — COMPREHENSIVE METABOLIC PANEL
ALBUMIN: 4.1 g/dL (ref 3.5–5.0)
ALK PHOS: 106 U/L (ref 38–126)
ALT: 43 U/L (ref 17–63)
ANION GAP: 9 (ref 5–15)
AST: 35 U/L (ref 15–41)
BUN: 37 mg/dL — ABNORMAL HIGH (ref 6–20)
CALCIUM: 8.8 mg/dL — AB (ref 8.9–10.3)
CO2: 28 mmol/L (ref 22–32)
Chloride: 101 mmol/L (ref 101–111)
Creatinine, Ser: 1.86 mg/dL — ABNORMAL HIGH (ref 0.61–1.24)
GFR calc non Af Amer: 36 mL/min — ABNORMAL LOW (ref >60)
GFR, EST AFRICAN AMERICAN: 42 mL/min — AB (ref >60)
Glucose, Bld: 175 mg/dL — ABNORMAL HIGH (ref 65–99)
Potassium: 3.4 mmol/L — ABNORMAL LOW (ref 3.5–5.1)
SODIUM: 138 mmol/L (ref 135–145)
Total Bilirubin: 0.4 mg/dL (ref 0.3–1.2)
Total Protein: 7.8 g/dL (ref 6.5–8.1)

## 2017-01-21 LAB — DIFFERENTIAL
BASOS ABS: 0 10*3/uL (ref 0.0–0.1)
Basophils Relative: 0 %
EOS PCT: 2 %
Eosinophils Absolute: 0.3 10*3/uL (ref 0.0–0.7)
LYMPHS PCT: 32 %
Lymphs Abs: 4 10*3/uL (ref 0.7–4.0)
Monocytes Absolute: 1.1 10*3/uL — ABNORMAL HIGH (ref 0.1–1.0)
Monocytes Relative: 9 %
NEUTROS ABS: 6.8 10*3/uL (ref 1.7–7.7)
NEUTROS PCT: 57 %

## 2017-01-21 LAB — I-STAT CHEM 8, ED
BUN: 37 mg/dL — ABNORMAL HIGH (ref 6–20)
CHLORIDE: 99 mmol/L — AB (ref 101–111)
Calcium, Ion: 1.11 mmol/L — ABNORMAL LOW (ref 1.15–1.40)
Creatinine, Ser: 1.9 mg/dL — ABNORMAL HIGH (ref 0.61–1.24)
GLUCOSE: 175 mg/dL — AB (ref 65–99)
HCT: 42 % (ref 39.0–52.0)
HEMOGLOBIN: 14.3 g/dL (ref 13.0–17.0)
POTASSIUM: 3.4 mmol/L — AB (ref 3.5–5.1)
Sodium: 141 mmol/L (ref 135–145)
TCO2: 30 mmol/L (ref 0–100)

## 2017-01-21 LAB — I-STAT TROPONIN, ED: TROPONIN I, POC: 0.01 ng/mL (ref 0.00–0.08)

## 2017-01-21 LAB — APTT: APTT: 27 s (ref 24–36)

## 2017-01-21 LAB — PROTIME-INR
INR: 0.97
PROTHROMBIN TIME: 12.9 s (ref 11.4–15.2)

## 2017-01-21 LAB — CBC
HCT: 40.7 % (ref 39.0–52.0)
HEMOGLOBIN: 13.7 g/dL (ref 13.0–17.0)
MCH: 29.8 pg (ref 26.0–34.0)
MCHC: 33.7 g/dL (ref 30.0–36.0)
MCV: 88.5 fL (ref 78.0–100.0)
PLATELETS: 213 10*3/uL (ref 150–400)
RBC: 4.6 MIL/uL (ref 4.22–5.81)
RDW: 13.9 % (ref 11.5–15.5)
WBC: 12.2 10*3/uL — AB (ref 4.0–10.5)

## 2017-01-21 LAB — ETHANOL

## 2017-01-21 LAB — BRAIN NATRIURETIC PEPTIDE: B NATRIURETIC PEPTIDE 5: 33 pg/mL (ref 0.0–100.0)

## 2017-01-21 MED ORDER — IOPAMIDOL (ISOVUE-370) INJECTION 76%: 75.0000 mL | Freq: Once | INTRAVENOUS | Status: DC | PRN

## 2017-01-21 MED ORDER — ASPIRIN 81 MG PO CHEW
324.0000 mg | CHEWABLE_TABLET | Freq: Once | ORAL | Status: AC
  Administered 2017-01-21: 324 mg via ORAL
  Filled 2017-01-21: qty 4

## 2017-01-21 NOTE — ED Notes (Signed)
[place] paged @ [phone] notified @ [phone]

## 2017-01-21 NOTE — ED Notes (Signed)
Waiting on [facility] to call back at this time. Delay d/t technical difficulties.

## 2017-01-21 NOTE — ED Notes (Signed)
To X-ray

## 2017-01-21 NOTE — ED Notes (Signed)
Code Stroke called per EDP. 

## 2017-01-21 NOTE — ED Notes (Signed)
EDP made aware of patient and to room at this time.

## 2017-01-21 NOTE — ED Notes (Signed)
Soc tele machine having technical issues and has lost connections at this time.

## 2017-01-21 NOTE — ED Notes (Signed)
[employer] tele reports having difficulty with machine and writer had to reboot machine twice.

## 2017-01-21 NOTE — ED Notes (Signed)
Patient to CT with nurse Juleen China, RN.

## 2017-01-21 NOTE — ED Notes (Signed)
SOC In progress.

## 2017-01-21 NOTE — ED Notes (Signed)
Patient has completed swallow evaluation, passed. Pt denies any numbness at this time, no other weakness noted. Pt did have some slurring of his speech which has improved since this RN assumed care at 2300. No headache or pain

## 2017-01-21 NOTE — ED Notes (Signed)
ED Provider at bedside. 

## 2017-01-21 NOTE — ED Notes (Signed)
Lab called

## 2017-01-21 NOTE — ED Provider Notes (Addendum)
[facility] DEPT Provider Note   CSN: [id number] Arrival date & time: 01/21/17  2209   By signing my name below, I, Eunice Blase, attest that this documentation has been prepared under the direction and in the presence of Fredia Sorrow, MD. Electronically signed, Eunice Blase, ED Scribe. 01/21/17. 10:45 PM.   History   Chief Complaint Chief Complaint  Patient presents with  . Dizziness   LEVEL 5 CAVEAT: HPI and ROS limited due to pt condition.   Patient with witnessed onset of slurred speech difficulty speaking some right-sided facial droop somewhere around 8:15 PM this evening. Patient was with wife when it occurred. Patient also had dizziness. Did not pass out. No complaint of headache. Patient states symptoms are improving but speech still seems to be off seems still seems to be some right-sided facial weakness. No history of any strokes in the past. Patient does have diabetes. Patient does have a history of heart problems but not coronary artery disease. No stents no bypass surgery. Patient without any known allergies.   The history is provided by the patient and medical records. The history is limited by the condition of the patient. No language interpreter was used.    Benjamin [name] is a 66 y.o. male T2DM, CHF, HTN and HLD, who presents to the Emergency Department with concern for sudden onset dizziness onset ~8 PM this evening while eating dinner. Associated slurred speech, neck pain, R sided numbness and facial droop noted. Past Medical History:  Diagnosis Date  . Arthritis   . CHF (congestive heart failure) (HCC)    diastolic CHF  . Essential hypertension, benign   . Gout   . Hypertensive heart disease    LVH with grade 2 diastolic dysfunction  . Mixed hyperlipidemia    Statin intolerance  . Rotator cuff tear    Chronic pain  . Sleep apnea    Possible, uses oxygen at bedtime at times. no formal sleep study.  . Tubular adenoma   . Type 2 diabetes mellitus  [facility])     Patient Active Problem List   Diagnosis Date Noted  . History of colonic polyps   . Diverticulosis of colon without hemorrhage   . Adenomatous colon polyp 04/16/2014  . Preoperative cardiovascular examination 03/24/2014  . Hypertensive heart disease 03/24/2014  . Personal history of colonic polyps 03/04/2014  . Rectal bleeding 03/01/2014  . Diverticulosis of colon 03/01/2014  . Gout 12/26/2012  . Obesity 12/20/2012  . Essential hypertension, benign 10/22/2011  . Hyperlipidemia 10/22/2011  . Type II or unspecified type diabetes mellitus without mention of complication, uncontrolled 10/22/2011    Past Surgical History:  Procedure Laterality Date  . APPENDECTOMY    . COLONOSCOPY N/A 10/29/2013   Dr. [name]- sigmoid polyp status post cold snare removal. large sessile ascending colon polyp. debulked with saline assisted snare polypectomy . pancolonic diverticulosis. inadequate prep. tubular adenoma on bx  . COLONOSCOPY N/A 02/22/2014   Dr. [name]: Saline-assisted snare polypectomy/biopsy for sprawling carpet polyp in the ascending colon which could not be completely removed. Status post biopsy (tubular adenoma), tattooed  . COLONOSCOPY N/A 03/01/2014   GQQ:PYPP polypectomy hemorrhage s/p bleeding  . COLONOSCOPY N/A 06/08/2015   Procedure: COLONOSCOPY;  Surgeon: [name], MD;  [facility];  Service: Endoscopy;  Laterality: N/A;  0900  . ESOPHAGOGASTRODUODENOSCOPY N/A 02/22/2014   Dr. [name]: Erosive reflux esophagitis. Schatzki ring status post dilation. Gastric and duodenal erosions with benign biopsies.  [name] LAPAROSCOPIC RIGHT COLECTOMY N/A 04/16/2014  Procedure: LAPAROSCOPIC ASSISTED RIGHT COLECTOMY;  Surgeon: [name], MD;  Location: [facility];  Service: General;  Laterality: N/A;  . [name] DILATION N/A 02/22/2014   Procedure: [name];  Surgeon: [name], MD;  Location[facility];  Service: Endoscopy;  Laterality: N/A;  . SAVORY DILATION N/A  02/22/2014   Procedure: SAVORY DILATION;  Surgeon: [name], MD;  Location: [facility];  Service: Endoscopy;  Laterality: N/A;       Home Medications    Prior to Admission medications   Medication Sig Start Date End Date Taking? Authorizing Provider  allopurinol (ZYLOPRIM) 100 MG tablet Take 100 mg by mouth daily.   Yes [provider]  amLODipine (NORVASC) 10 MG tablet Take 1 tablet (10 mg total) by mouth daily. 02/09/16  Yes Lendon Colonel, NP  chlorthalidone (HYGROTON) 25 MG tablet Take 25 mg by mouth daily.   Yes [provider]  insulin aspart (NOVOLOG FLEXPEN) 100 UNIT/ML FlexPen Inject into the skin 3 (three) times daily with meals.   Yes [provider]  losartan (COZAAR) 100 MG tablet Take 1 tablet (100 mg total) by mouth daily. 02/09/16  Yes Lendon Colonel, NP  metoprolol (LOPRESSOR) 100 MG tablet Take 1 tablet (100 mg total) by mouth 2 (two) times daily. 02/09/16  Yes Lendon Colonel, NP  pantoprazole (PROTONIX) 40 MG tablet Take 1 tablet (40 mg total) by mouth daily. 09/13/15  Yes Annitta Needs, NP  potassium chloride SA (K-DUR,KLOR-CON) 20 MEQ tablet Take 1 tablet (20 mEq total) by mouth daily. 02/09/16  Yes Lendon Colonel, NP  CINNAMON PO Take 1 tablet by mouth daily.    [provider]  diazepam (VALIUM) 10 MG tablet Take 10 mg by mouth every 6 (six) hours as needed for anxiety.    [provider]  insulin glargine (LANTUS) 100 UNIT/ML injection Inject 45 Units into the skin 2 (two) times daily.    [provider]  Menthol-Methyl Salicylate (ICY HOT BALM EXTRA STRENGTH EX) Apply 1 application topically at bedtime as needed (to shoulder).    [provider]  NIACIN PO Take 1 tablet by mouth daily.    [provider]  Omega-3 Fatty Acids (FISH OIL PO) Take 1 capsule by mouth daily.    [provider]  ONE TOUCH ULTRA TEST test strip  07/28/15   [provider]  Red  Yeast Rice Extract (RED YEAST RICE PO) Take 1 tablet by mouth daily.    [provider]    Family History Family History  Problem Relation Age of Onset  . Diabetes type II Mother   . Colon cancer Neg Hx     Social History Social History  Substance Use Topics  . Smoking status: Former Smoker    Types: Cigarettes    Start date: 09/17/1961    Quit date: 09/18/1975  . Smokeless tobacco: Never Used     Comment: quit 35 years ago  . Alcohol use No     Comment: None in over a year. prior to that 1-2 beers couple times per week.     Allergies   Niacin-lovastatin er; Statins; and Zocor [simvastatin - high dose]   Review of Systems Review of Systems  Unable to perform ROS: Acuity of condition     Physical Exam Updated Vital Signs BP (!) 154/84   Pulse 60   Ht 6\' 2"  (1.88 m)   Wt 128.8 kg   SpO2  95%   BMI 36.46 kg/m   Physical Exam  Constitutional: He appears well-developed and well-nourished. No distress.  HENT:  Head: Normocephalic.  Mouth/Throat: Oropharynx is clear and moist and mucous membranes are normal.  Eyes: Conjunctivae and EOM are normal. Pupils are equal, round, and reactive to light. No scleral icterus.  Neck: Neck supple.  Cardiovascular: Normal rate, regular rhythm and normal heart sounds.   Pulmonary/Chest: Effort normal and breath sounds normal. No respiratory distress. He has no wheezes. He has no rales.  Abdominal: Soft. Bowel sounds are normal. There is no tenderness. There is no rebound and no guarding.  Musculoskeletal: Normal range of motion. He exhibits no edema.  Negative leg swelling  Neurological: He is alert. A cranial nerve deficit is present. No sensory deficit. He exhibits normal muscle tone. Coordination normal.  Speech seems to be slurred. Does some questionable right-sided facial weakness. Motor strength intact.  Skin: Skin is warm and dry.  Psychiatric: He has a normal mood and affect.  Nursing note and vitals  reviewed.    ED Treatments / Results  DIAGNOSTIC STUDIES: Oxygen Saturation is 95% on RA by my interpretation.   COORDINATION OF CARE: 10:45 PM-Discussed next steps with pt. Pt verbalized understanding and is agreeable with the plan.    Labs (all labs ordered are listed, but only abnormal results are displayed) Labs Reviewed  CBC - Abnormal; Notable for the following:       Result Value   WBC 12.2 (*)    All other components within normal limits  DIFFERENTIAL - Abnormal; Notable for the following:    Monocytes Absolute 1.1 (*)    All other components within normal limits  I-STAT CHEM 8, ED - Abnormal; Notable for the following:    Potassium 3.4 (*)    Chloride 99 (*)    BUN 37 (*)    Creatinine, Ser 1.90 (*)    Glucose, Bld 175 (*)    Calcium, Ion 1.11 (*)    All other components within normal limits  ETHANOL  PROTIME-INR  APTT  COMPREHENSIVE METABOLIC PANEL  RAPID URINE DRUG SCREEN, HOSP PERFORMED  URINALYSIS, ROUTINE W REFLEX MICROSCOPIC  BRAIN NATRIURETIC PEPTIDE  I-STAT TROPOININ, ED   Results for orders placed or performed during the hospital encounter of 01/21/17  Protime-INR  Result Value Ref Range   Prothrombin Time 12.9 11.4 - 15.2 seconds   INR 0.97   APTT  Result Value Ref Range   aPTT 27 24 - 36 seconds  CBC  Result Value Ref Range   WBC 12.2 (H) 4.0 - 10.5 K/uL   RBC 4.60 4.22 - 5.81 MIL/uL   Hemoglobin 13.7 13.0 - 17.0 g/dL   HCT 40.7 39.0 - 52.0 %   MCV 88.5 78.0 - 100.0 fL   MCH 29.8 26.0 - 34.0 pg   MCHC 33.7 30.0 - 36.0 g/dL   RDW 13.9 11.5 - 15.5 %   Platelets 213 150 - 400 K/uL  Differential  Result Value Ref Range   Neutrophils Relative % 57 %   Neutro Abs 6.8 1.7 - 7.7 K/uL   Lymphocytes Relative 32 %   Lymphs Abs 4.0 0.7 - 4.0 K/uL   Monocytes Relative 9 %   Monocytes Absolute 1.1 (H) 0.1 - 1.0 K/uL   Eosinophils Relative 2 %   Eosinophils Absolute 0.3 0.0 - 0.7 K/uL   Basophils Relative 0 %   Basophils Absolute 0.0 0.0 - 0.1  K/uL  I-Stat Chem 8, ED  (  not at [facility])  Result Value Ref Range   Sodium 141 135 - 145 mmol/L   Potassium 3.4 (L) 3.5 - 5.1 mmol/L   Chloride 99 (L) 101 - 111 mmol/L   BUN 37 (H) 6 - 20 mg/dL   Creatinine, Ser 1.90 (H) 0.61 - 1.24 mg/dL   Glucose, Bld 175 (H) 65 - 99 mg/dL   Calcium, Ion 1.11 (L) 1.15 - 1.40 mmol/L   TCO2 30 0 - 100 mmol/L   Hemoglobin 14.3 13.0 - 17.0 g/dL   HCT 42.0 39.0 - 52.0 %     EKG  EKG Interpretation None       Radiology Ct Head Code Stroke W/o Cm  Result Date: 01/21/2017 CLINICAL DATA:  Code stroke. Slurred speech and dizziness began earlier this evening. RIGHT-sided numbness. Facial droop. EXAM: CT HEAD WITHOUT CONTRAST TECHNIQUE: Contiguous axial images were obtained from the base of the skull through the vertex without intravenous contrast. COMPARISON:  None. FINDINGS: Brain: No evidence of acute infarction, hemorrhage, hydrocephalus, extra-axial collection or mass lesion/mass effect. Normal for age cerebral volume. Fairly extensive hypoattenuation of white matter consistent with small vessel disease, likely combination of hypertension and diabetes. Vascular: No signs of emergent large vessel occlusion. Artifactual proximal basilar artery hyperattenuation appears to be secondary to streak artifact. Skull: Normal. Negative for fracture or focal lesion. Sinuses/Orbits: No acute finding. Other: None. ASPECTS [facility] Stroke Program Early CT Score) - Ganglionic level infarction (caudate, lentiform nuclei, internal capsule, insula, M1-M3 cortex): 7 - Supraganglionic infarction (M4-M6 cortex): 3 Total score (0-10 with 10 being normal): 10 IMPRESSION: 1. Extensive hypoattenuation of the white matter consistent with small vessel disease. No acute cortical infarct is evident. 2. ASPECTS is 10. These results were called by telephone at the time of interpretation on 01/21/2017 at 10:30 pm to Dr. Fredia Sorrow , who verbally acknowledged these results. Electronically  Signed   By: Staci Righter M.D.   On: 01/21/2017 22:36    Procedures Procedures (including critical care time)   CRITICAL CARE Performed by: Fredia Sorrow Total critical care time: 30 minutes Critical care time was exclusive of separately billable procedures and treating other patients. Critical care was necessary to treat or prevent imminent or life-threatening deterioration. Critical care was time spent personally by me on the following activities: development of treatment plan with patient and/or surrogate as well as nursing, discussions with consultants, evaluation of patient's response to treatment, examination of patient, obtaining history from patient or surrogate, ordering and performing treatments and interventions, ordering and review of laboratory studies, ordering and review of radiographic studies, pulse oximetry and re-evaluation of patient's condition.  Medications Ordered in ED Medications - No data to display   Initial Impression / Assessment and Plan / ED Course  I have reviewed the triage vital signs and the nursing notes.  Pertinent labs & imaging results that were available during my care of the patient were reviewed by me and considered in my medical decision making (see chart for details).    Code stroke was activated upon arrival to the emergency department. Head CT reported by neuroradiology no evidence of any head bleed no acute findings.  Based on his symptoms of the dizziness will consider CT angiogram of head and neck.  Right neurology interview him. and get their advice.   last symptoms definitely very concerning for an acute CVA process. Patient is in the foreign half hour window for TPA if necessary. But based on his NIH scale and the current presentation  may not be a candidate for TPA.  Patients creatinine is elevated at 1.9. Patient blood pressure systolic around 286. The having difficulty getting the telemetry interview set up with  neurology.  Patient without any worsening symptoms.   Final Clinic Some basical Impressions(s) / ED Diagnoses   Final diagnoses:  Cerebrovascular accident (CVA), unspecified mechanism ([place])  Dizziness    New Prescriptions New Prescriptions   No medications on file  I personally performed the services described in this documentation, which was scribed in my presence. The recorded information has been reviewed and is accurate.      Fredia Sorrow, MD 01/21/17 2257   Discussed with the tele neurologist. Patient not candidate for TPA. Recommends aspirin MR in the morning. Due to his creatinine also not recommending CTA at this time.  Patient showing signs of improvement. Will contact the hospitalist for admission.   Fredia Sorrow, MD 01/21/17 2315  Discussed with Dr. Shanon Brow patient will be admitted here.   Fredia Sorrow, MD 01/21/17 2322    Fredia Sorrow, MD 01/24/17 1229

## 2017-01-21 NOTE — ED Triage Notes (Addendum)
Pt states he was eating supper tonight at 2000 and began to have slurred speech and dizziness; pt states he has some right sided numbness and his wife states he had some facial droop; Code stroke called at 2214; pt on the way to CT; EDP to evalutate

## 2017-01-22 ENCOUNTER — Encounter (HOSPITAL_COMMUNITY): Payer: Self-pay

## 2017-01-22 ENCOUNTER — Observation Stay (HOSPITAL_COMMUNITY): Payer: Medicare HMO

## 2017-01-22 DIAGNOSIS — I10 Essential (primary) hypertension: Secondary | ICD-10-CM | POA: Diagnosis not present

## 2017-01-22 DIAGNOSIS — R42 Dizziness and giddiness: Secondary | ICD-10-CM | POA: Diagnosis present

## 2017-01-22 DIAGNOSIS — M109 Gout, unspecified: Secondary | ICD-10-CM | POA: Diagnosis present

## 2017-01-22 DIAGNOSIS — N179 Acute kidney failure, unspecified: Secondary | ICD-10-CM | POA: Diagnosis present

## 2017-01-22 DIAGNOSIS — I130 Hypertensive heart and chronic kidney disease with heart failure and stage 1 through stage 4 chronic kidney disease, or unspecified chronic kidney disease: Secondary | ICD-10-CM | POA: Diagnosis present

## 2017-01-22 DIAGNOSIS — Z87891 Personal history of nicotine dependence: Secondary | ICD-10-CM | POA: Diagnosis not present

## 2017-01-22 DIAGNOSIS — E1165 Type 2 diabetes mellitus with hyperglycemia: Secondary | ICD-10-CM | POA: Diagnosis present

## 2017-01-22 DIAGNOSIS — I6523 Occlusion and stenosis of bilateral carotid arteries: Secondary | ICD-10-CM | POA: Diagnosis not present

## 2017-01-22 DIAGNOSIS — E669 Obesity, unspecified: Secondary | ICD-10-CM | POA: Diagnosis present

## 2017-01-22 DIAGNOSIS — R4701 Aphasia: Secondary | ICD-10-CM | POA: Diagnosis present

## 2017-01-22 DIAGNOSIS — N182 Chronic kidney disease, stage 2 (mild): Secondary | ICD-10-CM | POA: Diagnosis present

## 2017-01-22 DIAGNOSIS — E785 Hyperlipidemia, unspecified: Secondary | ICD-10-CM | POA: Diagnosis not present

## 2017-01-22 DIAGNOSIS — I639 Cerebral infarction, unspecified: Secondary | ICD-10-CM | POA: Diagnosis present

## 2017-01-22 DIAGNOSIS — Z794 Long term (current) use of insulin: Secondary | ICD-10-CM | POA: Diagnosis not present

## 2017-01-22 DIAGNOSIS — D72829 Elevated white blood cell count, unspecified: Secondary | ICD-10-CM | POA: Diagnosis present

## 2017-01-22 DIAGNOSIS — R9431 Abnormal electrocardiogram [ECG] [EKG]: Secondary | ICD-10-CM | POA: Diagnosis not present

## 2017-01-22 DIAGNOSIS — E782 Mixed hyperlipidemia: Secondary | ICD-10-CM | POA: Diagnosis present

## 2017-01-22 DIAGNOSIS — E118 Type 2 diabetes mellitus with unspecified complications: Secondary | ICD-10-CM | POA: Diagnosis not present

## 2017-01-22 DIAGNOSIS — E1122 Type 2 diabetes mellitus with diabetic chronic kidney disease: Secondary | ICD-10-CM | POA: Diagnosis present

## 2017-01-22 DIAGNOSIS — Z833 Family history of diabetes mellitus: Secondary | ICD-10-CM | POA: Diagnosis not present

## 2017-01-22 DIAGNOSIS — R29810 Facial weakness: Secondary | ICD-10-CM | POA: Diagnosis present

## 2017-01-22 DIAGNOSIS — I5032 Chronic diastolic (congestive) heart failure: Secondary | ICD-10-CM | POA: Diagnosis present

## 2017-01-22 DIAGNOSIS — R4781 Slurred speech: Secondary | ICD-10-CM | POA: Diagnosis present

## 2017-01-22 LAB — URINALYSIS, ROUTINE W REFLEX MICROSCOPIC
BILIRUBIN URINE: NEGATIVE
Glucose, UA: 50 mg/dL — AB
HGB URINE DIPSTICK: NEGATIVE
Ketones, ur: NEGATIVE mg/dL
Leukocytes, UA: NEGATIVE
NITRITE: NEGATIVE
PH: 5 (ref 5.0–8.0)
Protein, ur: NEGATIVE mg/dL
SPECIFIC GRAVITY, URINE: 1.014 (ref 1.005–1.030)

## 2017-01-22 LAB — LIPID PANEL
Cholesterol: 180 mg/dL (ref 0–200)
HDL: 25 mg/dL — AB (ref >40)
LDL Cholesterol: 77 mg/dL (ref 0–99)
Total CHOL/HDL Ratio: 7.2 RATIO
Triglycerides: 391 mg/dL — ABNORMAL HIGH (ref <150)
VLDL: 78 mg/dL — ABNORMAL HIGH (ref 0–40)

## 2017-01-22 LAB — RAPID URINE DRUG SCREEN, HOSP PERFORMED
AMPHETAMINES: NOT DETECTED
BENZODIAZEPINES: POSITIVE — AB
Barbiturates: NOT DETECTED
Cocaine: NOT DETECTED
OPIATES: NOT DETECTED
Tetrahydrocannabinol: NOT DETECTED

## 2017-01-22 LAB — GLUCOSE, CAPILLARY
GLUCOSE-CAPILLARY: 188 mg/dL — AB (ref 65–99)
GLUCOSE-CAPILLARY: 212 mg/dL — AB (ref 65–99)
GLUCOSE-CAPILLARY: 236 mg/dL — AB (ref 65–99)
Glucose-Capillary: 155 mg/dL — ABNORMAL HIGH (ref 65–99)
Glucose-Capillary: 223 mg/dL — ABNORMAL HIGH (ref 65–99)

## 2017-01-22 MED ORDER — METOPROLOL TARTRATE 50 MG PO TABS
100.0000 mg | ORAL_TABLET | Freq: Two times a day (BID) | ORAL | Status: DC
Start: 2017-01-22 — End: 2017-01-23
  Administered 2017-01-22 – 2017-01-23 (×3): 100 mg via ORAL
  Filled 2017-01-22 (×3): qty 2

## 2017-01-22 MED ORDER — POTASSIUM CHLORIDE CRYS ER 20 MEQ PO TBCR
20.0000 meq | EXTENDED_RELEASE_TABLET | Freq: Every day | ORAL | Status: DC
  Administered 2017-01-22 – 2017-01-23 (×2): 20 meq via ORAL
  Filled 2017-01-22 (×2): qty 1

## 2017-01-22 MED ORDER — STROKE: EARLY STAGES OF RECOVERY BOOK
Freq: Once | Status: AC
  Administered 2017-01-22: 08:00:00
  Filled 2017-01-22: qty 1

## 2017-01-22 MED ORDER — DIPHENHYDRAMINE HCL 50 MG/ML IJ SOLN
25.0000 mg | Freq: Once | INTRAMUSCULAR | Status: AC
  Administered 2017-01-22: 25 mg via INTRAVENOUS
  Filled 2017-01-22: qty 1

## 2017-01-22 MED ORDER — METOCLOPRAMIDE HCL 5 MG/ML IJ SOLN
10.0000 mg | Freq: Once | INTRAMUSCULAR | Status: AC
  Administered 2017-01-22: 10 mg via INTRAVENOUS
  Filled 2017-01-22: qty 2

## 2017-01-22 MED ORDER — INSULIN ASPART 100 UNIT/ML ~~LOC~~ SOLN
4.0000 [IU] | Freq: Three times a day (TID) | SUBCUTANEOUS | Status: DC
  Administered 2017-01-23: 4 [IU] via SUBCUTANEOUS

## 2017-01-22 MED ORDER — ALLOPURINOL 100 MG PO TABS
100.0000 mg | ORAL_TABLET | Freq: Every day | ORAL | Status: DC
  Administered 2017-01-22 – 2017-01-23 (×2): 100 mg via ORAL
  Filled 2017-01-22 (×2): qty 1

## 2017-01-22 MED ORDER — DIAZEPAM 5 MG PO TABS
10.0000 mg | ORAL_TABLET | Freq: Four times a day (QID) | ORAL | Status: DC | PRN
  Administered 2017-01-22: 10 mg via ORAL
  Filled 2017-01-22: qty 2

## 2017-01-22 MED ORDER — AMLODIPINE BESYLATE 5 MG PO TABS
10.0000 mg | ORAL_TABLET | Freq: Every day | ORAL | Status: DC
Start: 2017-01-22 — End: 2017-01-23
  Administered 2017-01-22 – 2017-01-23 (×2): 10 mg via ORAL
  Filled 2017-01-22 (×2): qty 2

## 2017-01-22 MED ORDER — LOSARTAN POTASSIUM 50 MG PO TABS
100.0000 mg | ORAL_TABLET | Freq: Every day | ORAL | Status: DC
  Administered 2017-01-22 – 2017-01-23 (×2): 100 mg via ORAL
  Filled 2017-01-22 (×2): qty 2

## 2017-01-22 MED ORDER — INSULIN ASPART 100 UNIT/ML ~~LOC~~ SOLN
0.0000 [IU] | Freq: Three times a day (TID) | SUBCUTANEOUS | Status: DC
  Administered 2017-01-22 (×2): 2 [IU] via SUBCUTANEOUS
  Administered 2017-01-22: 3 [IU] via SUBCUTANEOUS
  Administered 2017-01-23: 2 [IU] via SUBCUTANEOUS

## 2017-01-22 MED ORDER — ONDANSETRON HCL 4 MG/2ML IJ SOLN: 4.0000 mg | Freq: Four times a day (QID) | INTRAMUSCULAR | Status: DC | PRN

## 2017-01-22 MED ORDER — ASPIRIN 325 MG PO TABS
325.0000 mg | ORAL_TABLET | Freq: Every day | ORAL | Status: DC
  Administered 2017-01-22 – 2017-01-23 (×2): 325 mg via ORAL
  Filled 2017-01-22 (×2): qty 1

## 2017-01-22 MED ORDER — PANTOPRAZOLE SODIUM 40 MG PO TBEC
40.0000 mg | DELAYED_RELEASE_TABLET | Freq: Every day | ORAL | Status: DC
  Administered 2017-01-22 – 2017-01-23 (×2): 40 mg via ORAL
  Filled 2017-01-22 (×2): qty 1

## 2017-01-22 MED ORDER — INSULIN ASPART 100 UNIT/ML FLEXPEN: 5.0000 [IU] | PEN_INJECTOR | Freq: Three times a day (TID) | SUBCUTANEOUS | Status: DC

## 2017-01-22 MED ORDER — ASPIRIN 300 MG RE SUPP
300.0000 mg | Freq: Every day | RECTAL | Status: DC
  Filled 2017-01-22: qty 1

## 2017-01-22 MED ORDER — PRAVASTATIN SODIUM 10 MG PO TABS
10.0000 mg | ORAL_TABLET | Freq: Every day | ORAL | Status: DC
  Administered 2017-01-22: 10 mg via ORAL

## 2017-01-22 MED ORDER — CHLORTHALIDONE 25 MG PO TABS
25.0000 mg | ORAL_TABLET | Freq: Every day | ORAL | Status: DC
  Administered 2017-01-22 – 2017-01-23 (×2): 25 mg via ORAL
  Filled 2017-01-22 (×4): qty 1

## 2017-01-22 MED ORDER — INSULIN GLARGINE 100 UNIT/ML ~~LOC~~ SOLN
50.0000 [IU] | Freq: Two times a day (BID) | SUBCUTANEOUS | Status: DC
  Administered 2017-01-22 – 2017-01-23 (×3): 50 [IU] via SUBCUTANEOUS
  Filled 2017-01-22 (×6): qty 0.5

## 2017-01-22 MED ORDER — ACETAMINOPHEN 325 MG PO TABS
650.0000 mg | ORAL_TABLET | Freq: Four times a day (QID) | ORAL | Status: DC | PRN
  Administered 2017-01-22: 650 mg via ORAL
  Filled 2017-01-22 (×2): qty 2

## 2017-01-22 NOTE — Progress Notes (Signed)
Code stroke  ER called radiology and they called me about code stroke on the way  1015pm  Beeper    1025 Pt. In CT   1020 Out of CT   1025 [facility]    1025 Radiologist   1028

## 2017-01-22 NOTE — ACP (Advance Care Planning) (Signed)
Gave patient Advance Directives information and began discussion about completing it. Will follow up tomorrow.

## 2017-01-22 NOTE — Evaluation (Signed)
Physical Therapy Evaluation Patient Details Name: Benjamin [name] MRN: [id number] DOB: [birth year] Today's Date: 01/22/2017   History of Present Illness   Benjamin [name] is a 66 y.o. male with medical history significant of DM, HTN, CHF, HLD comes in withsudden onset of slurred speech/aphasia at around 8pm with associated right facial droop per wife who witnessed event.  Pt reports he got all of a sudden dizzy and could hardly speak.  Felt numbness in his lips, he did not really note any weakness anywhere but his wife felt his right face was drooping down.  This lasted for a couple of hours and spontaneously resolved in the ED.  Code stroke was called, cta was entertained by teleneuro but due to resolution of his symptoms he was felt not to be a tpa candidate and the risk of contrast due to his CKD was not worth pursuing.  Pt is back to normal.  He takes no aspirin products daily.  Pt referred for admission for possible TIA.    Clinical Impression  Pt received in bed, and was agreeable to PT evaluation.  Pt states he is normally independent with ambulation "all over the farm," independent with ADL's, and IADL's, still driving, and is a retired Clinical biochemist.  Pt was able to ambulate 457ft with no device, and does not demonstrate any overt LOB during normal gait, however he scored a 49/56 on the BERG balance test, which places him at moderate risk for falling.  Pt states that these balance deficits are nothing new, and they are things he had been struggling with for awhile due to chronic back pain, and arthritis.  No follow up PT recommended at this time, but encouraged pt to increase his activity level at home.     Follow Up Recommendations No PT follow up    Equipment Recommendations  None recommended by PT    Recommendations for Other Services       Precautions / Restrictions Precautions Precautions: None Restrictions Weight Bearing Restrictions: No      Mobility  Bed Mobility Overal bed  mobility: Independent                Transfers Overall transfer level: Independent Equipment used: None                Ambulation/Gait Ambulation/Gait assistance: Independent Ambulation Distance (Feet): 400 Feet Assistive device: None Gait Pattern/deviations: WFL(Within Functional Limits)     General Gait Details: No LOB  Stairs            Wheelchair Mobility    Modified Rankin (Stroke Patients Only)       Balance Overall balance assessment: Independent                               Standardized Balance Assessment Standardized Balance Assessment : Berg Balance Test Berg Balance Test Sit to Stand: Able to stand  independently using hands Standing Unsupported: Able to stand safely 2 minutes Sitting with Back Unsupported but Feet Supported on Floor or Stool: Able to sit safely and securely 2 minutes Stand to Sit: Controls descent by using hands Transfers: Able to transfer safely, minor use of hands Standing Unsupported with Eyes Closed: Able to stand 10 seconds safely Standing Ubsupported with Feet Together: Able to place feet together independently and stand 1 minute safely From Standing, Reach Forward with Outstretched Arm: Can reach forward >12 cm safely (5") From Standing Position, Pick up Object  from Floor: Able to pick up shoe, needs supervision From Standing Position, Turn to Look Behind Over each Shoulder: Looks behind one side only/other side shows less weight shift Turn 360 Degrees: Able to turn 360 degrees safely in 4 seconds or less Standing Unsupported, Alternately Place Feet on Step/Stool: Able to stand independently and safely and complete 8 steps in 20 seconds Standing Unsupported, One Foot in Front: Able to plae foot ahead of the other independently and hold 30 seconds Standing on One Leg: Able to lift leg independently and hold 5-10 seconds Total Score: 49         Pertinent Vitals/Pain Pain Assessment: 0-10 Pain Score:  4  Pain Location: Headache Pain Descriptors / Indicators: Aching Pain Intervention(s): Limited activity within patient's tolerance;Monitored during session;RN gave pain meds during session;Repositioned;Patient requesting pain meds-RN notified    Home Living Family/patient expects to be discharged to:: Private residence Living Arrangements: Spouse/significant other   Type of Home: House Home Access: Stairs to enter Entrance Stairs-Rails: None Entrance Stairs-Number of Steps: 5. Handicapped ramp in the back of the house Home Layout: [place] - single point      Prior Function Level of Independence: Needs assistance   Gait / Transfers Assistance Needed: Pt used no device for ambulation.  ADL's / Homemaking Assistance Needed: Pt reports that wife would help with LBD occassionally due to chronic back issues and arthritis.  Comments: Driving, retired Clinical biochemist due to CHF.      Hand Dominance   Dominant Hand: Right    Extremity/Trunk Assessment   Upper Extremity Assessment Upper Extremity Assessment: Overall WFL for tasks assessed    Lower Extremity Assessment Lower Extremity Assessment: Overall WFL for tasks assessed       Communication   Communication: No difficulties  Cognition Arousal/Alertness: Awake/alert Behavior During Therapy: WFL for tasks assessed/performed Overall Cognitive Status: Within Functional Limits for tasks assessed                                        General Comments      Exercises     Assessment/Plan    PT Assessment Patent does not need any further PT services  PT Problem List         PT Treatment Interventions      PT Goals (Current goals can be found in the Care Plan section)  Acute Rehab PT Goals Patient Stated Goal: To go home PT Goal Formulation: All assessment and education complete, DC therapy    Frequency     Barriers to discharge        Co-evaluation                AM-PAC PT "6 Clicks" Daily Activity  Outcome Measure Difficulty turning over in bed (including adjusting bedclothes, sheets and blankets)?: None Difficulty moving from lying on back to sitting on the side of the bed? : None Difficulty sitting down on and standing up from a chair with arms (e.g., wheelchair, bedside commode, etc,.)?: None Help needed moving to and from a bed to chair (including a wheelchair)?: None Help needed walking in hospital room?: None Help needed climbing 3-5 steps with a railing? : None 6 Click Score: 24    End of Session Equipment Utilized During Treatment: Gait belt Activity Tolerance: Patient tolerated treatment well Patient left: with call bell/phone within reach;with family/visitor present (sitting on the EOB) Nurse  Communication: Mobility status (mobility sheet left up in the room.  ) PT Visit Diagnosis: Other symptoms and signs involving the nervous system (R29.898);Other abnormalities of gait and mobility (R26.89)    Time: 1607-3710 PT Time Calculation (min) (ACUTE ONLY): 21 min   Charges:   PT Evaluation $PT Eval Low Complexity: 1 Procedure     PT G Codes:   PT G-Codes **NOT FOR INPATIENT CLASS** Functional Assessment Tool Used: AM-PAC 6 Clicks Basic Mobility;Clinical judgement Functional Limitation: Mobility: Walking and moving around Mobility: Walking and Moving Around Current Status (G2694): 0 percent impaired, limited or restricted Mobility: Walking and Moving Around Goal Status (W5462): 0 percent impaired, limited or restricted Mobility: Walking and Moving Around Discharge Status (V0350): 0 percent impaired, limited or restricted    Beth Lc Joynt, PT, DPT X: [id number]

## 2017-01-22 NOTE — H&P (Signed)
History and Physical    [name] MGQ:[id number] DOB: 20-Nov-1950 DOA: 01/21/2017  PCP: Cory Munch, PA-C  Patient coming from:  home  Chief Complaint:   Slurred speech  HPI: Benjamin [name] [name] is a 66 y.o. male with medical history significant of DM, HTN, CHF, HLD comes in withsudden onset of slurred speech/aphasia at around 8pm with associated right facial droop per wife who witnessed event.  Pt reports he got all of a sudden dizzy and could hardly speak.  Felt numbness in his lips, he did not really note any weakness anywhere but his wife felt his right face was drooping down.  This lasted for a couple of hours and spontaneously resolved in the ED.  Code stroke was called, cta was entertained by teleneuro but due to resolution of his symptoms he was felt not to be a tpa candidate and the risk of contrast due to his CKD was not worth pursuing.  Pt is back to normal.  He takes no aspirin products daily.  Pt referred for admission for possible TIA.   Review of Systems: As per HPI otherwise 10 point review of systems negative.   Past Medical History:  Diagnosis Date  . Arthritis   . CHF (congestive heart failure) (HCC)    diastolic CHF  . Essential hypertension, benign   . Gout   . Hypertensive heart disease    LVH with grade 2 diastolic dysfunction  . Mixed hyperlipidemia    Statin intolerance  . Rotator cuff tear    Chronic pain  . Sleep apnea    Possible, uses oxygen at bedtime at times. no formal sleep study.  . Tubular adenoma   . Type 2 diabetes mellitus ([place])     Past Surgical History:  Procedure Laterality Date  . APPENDECTOMY    . COLONOSCOPY N/A 10/29/2013   Dr. [name]- sigmoid polyp status post cold snare removal. large sessile ascending colon polyp. debulked with saline assisted snare polypectomy . pancolonic diverticulosis. inadequate prep. tubular adenoma on bx  . COLONOSCOPY N/A 02/22/2014   Dr. [name]: Saline-assisted snare polypectomy/biopsy for sprawling carpet  polyp in the ascending colon which could not be completely removed. Status post biopsy (tubular adenoma), tattooed  . COLONOSCOPY N/A 03/01/2014   OIZ:TIWP polypectomy hemorrhage s/p bleeding  . COLONOSCOPY N/A 06/08/2015   Procedure: COLONOSCOPY;  Surgeon: [name], MD;  [facility];  Service: Endoscopy;  Laterality: N/A;  0900  . ESOPHAGOGASTRODUODENOSCOPY N/A 02/22/2014   Dr. [name]: Erosive reflux esophagitis. Schatzki ring status post dilation. Gastric and duodenal erosions with benign biopsies.  [name] LAPAROSCOPIC RIGHT COLECTOMY N/A 04/16/2014   Procedure: LAPAROSCOPIC ASSISTED RIGHT COLECTOMY;  Surgeon: [name], MD;  Location: [facility];  Service: General;  Laterality: N/A;  . [name] DILATION N/A 02/22/2014   Procedure: [name];  Surgeon: [name], MD;  [facility]: [facility];  Service: Endoscopy;  Laterality: N/A;  . SAVORY DILATION N/A 02/22/2014   Procedure: SAVORY DILATION;  Surgeon: [name], MD;  Location: [facility];  Service: Endoscopy;  Laterality: N/A;     reports that he quit smoking about 41 years ago. His smoking use included Cigarettes. He started smoking about 55 years ago. He has never used smokeless tobacco. He reports that he does not drink alcohol or use drugs.  Allergies  Allergen Reactions  . Niacin-Lovastatin Er Other (See Comments)    ADVICOR= Body aches  . Statins     Body aches  .  Zocor [Simvastatin - High Dose] Other (See Comments)    Body aches.    Family History  Problem Relation Age of Onset  . Diabetes type II Mother   . Colon cancer Neg Hx     Prior to Admission medications   Medication Sig Start Date End Date Taking? Authorizing Provider  acetaminophen (TYLENOL) 650 MG CR tablet Take 650 mg by mouth every 8 (eight) hours as needed for pain.   Yes [provider]  allopurinol (ZYLOPRIM) 100 MG tablet Take 100 mg by mouth daily.   Yes [provider]  amLODipine (NORVASC) 10 MG tablet Take  1 tablet (10 mg total) by mouth daily. 02/09/16  Yes Lendon Colonel, NP  chlorthalidone (HYGROTON) 25 MG tablet Take 25 mg by mouth daily.   Yes [provider]  diazepam (VALIUM) 10 MG tablet Take 10 mg by mouth every 6 (six) hours as needed for anxiety.   Yes [provider]  insulin aspart (NOVOLOG FLEXPEN) 100 UNIT/ML FlexPen Inject 5-15 Units into the skin 3 (three) times daily with meals. Sliding scale   Yes [provider]  insulin glargine (LANTUS) 100 UNIT/ML injection Inject 50 Units into the skin 2 (two) times daily.    Yes [provider]  losartan (COZAAR) 100 MG tablet Take 1 tablet (100 mg total) by mouth daily. 02/09/16  Yes Lendon Colonel, NP  metoprolol (LOPRESSOR) 100 MG tablet Take 1 tablet (100 mg total) by mouth 2 (two) times daily. 02/09/16  Yes Lendon Colonel, NP  pantoprazole (PROTONIX) 40 MG tablet Take 1 tablet (40 mg total) by mouth daily. 09/13/15  Yes Annitta Needs, NP  potassium chloride SA (K-DUR,KLOR-CON) 20 MEQ tablet Take 1 tablet (20 mEq total) by mouth daily. 02/09/16  Yes Lendon Colonel, NP    Physical Exam: Vitals:   01/21/17 2317 01/21/17 2330 01/21/17 2331 01/21/17 2345  BP: 134/66 133/80  (!) 156/94  Pulse: 67 65  (!) 57  Resp: (!) 21 14  18   Temp:   97.6 F (36.4 C)   SpO2: 97% 98%  97%  Weight:      Height:        Constitutional: NAD, calm, comfortable Vitals:   01/21/17 2317 01/21/17 2330 01/21/17 2331 01/21/17 2345  BP: 134/66 133/80  (!) 156/94  Pulse: 67 65  (!) 57  Resp: (!) 21 14  18   Temp:   97.6 F (36.4 C)   SpO2: 97% 98%  97%  Weight:      Height:       Eyes: PERRL, lids and conjunctivae normal ENMT: Mucous membranes are moist. Posterior pharynx clear of any exudate or lesions.Normal dentition.  Neck: normal, supple, no masses, no thyromegaly Respiratory: clear to auscultation bilaterally, no wheezing, no crackles. Normal respiratory effort. No accessory muscle use.    Cardiovascular: Regular rate and rhythm, no murmurs / rubs / gallops. No extremity edema. 2+ pedal pulses. No carotid bruits.  Abdomen: no tenderness, no masses palpated. No hepatosplenomegaly. Bowel sounds positive.  Musculoskeletal: no clubbing / cyanosis. No joint deformity upper and lower extremities. Good ROM, no contractures. Normal muscle tone.  Skin: no rashes, lesions, ulcers. No induration Neurologic: CN 2-12 grossly intact. Sensation intact, DTR normal. Strength 5/5 in all 4.  Psychiatric: Normal judgment and insight. Alert and oriented x 3. Normal mood.    Labs on Admission: I have personally reviewed following labs and imaging studies  CBC:  Recent Labs Lab 01/21/17 2232  01/21/17 2243  WBC 12.2*  --   NEUTROABS 6.8  --   HGB 13.7 14.3  HCT 40.7 42.0  MCV 88.5  --   PLT 213  --    Basic Metabolic Panel:  Recent Labs Lab 01/21/17 2232 01/21/17 2243  NA 138 141  K 3.4* 3.4*  CL 101 99*  CO2 28  --   GLUCOSE 175* 175*  BUN 37* 37*  CREATININE 1.86* 1.90*  CALCIUM 8.8*  --    GFR: Estimated Creatinine Clearance: 53.8 mL/min (A) (by C-G formula based on SCr of 1.9 mg/dL (H)). Liver Function Tests:  Recent Labs Lab 01/21/17 2232  AST 35  ALT 43  ALKPHOS 106  BILITOT 0.4  PROT 7.8  ALBUMIN 4.1   Coagulation Profile:  Recent Labs Lab 01/21/17 2232  INR 0.97    Radiological Exams on Admission: Dg Chest 2 View  Result Date: 01/21/2017 CLINICAL DATA:  Slurred speech and dizziness EXAM: CHEST  2 VIEW COMPARISON:  Chest radiograph 04/12/2014 FINDINGS: Unchanged cardiomegaly. No overt pulmonary edema. No focal consolidation, pleural effusion or pneumothorax. IMPRESSION: Cardiomegaly without active cardiopulmonary disease. Electronically Signed   By: Ulyses Jarred M.D.   On: 01/21/2017 22:43   Ct Head Code Stroke W/o Cm  Result Date: 01/21/2017 CLINICAL DATA:  Code stroke. Slurred speech and dizziness began earlier this evening. RIGHT-sided numbness.  Facial droop. EXAM: CT HEAD WITHOUT CONTRAST TECHNIQUE: Contiguous axial images were obtained from the base of the skull through the vertex without intravenous contrast. COMPARISON:  None. FINDINGS: Brain: No evidence of acute infarction, hemorrhage, hydrocephalus, extra-axial collection or mass lesion/mass effect. Normal for age cerebral volume. Fairly extensive hypoattenuation of white matter consistent with small vessel disease, likely combination of hypertension and diabetes. Vascular: No signs of emergent large vessel occlusion. Artifactual proximal basilar artery hyperattenuation appears to be secondary to streak artifact. Skull: Normal. Negative for fracture or focal lesion. Sinuses/Orbits: No acute finding. Other: None. ASPECTS [facility] Stroke Program Early CT Score) - Ganglionic level infarction (caudate, lentiform nuclei, internal capsule, insula, M1-M3 cortex): 7 - Supraganglionic infarction (M4-M6 cortex): 3 Total score (0-10 with 10 being normal): 10 IMPRESSION: 1. Extensive hypoattenuation of the white matter consistent with small vessel disease. No acute cortical infarct is evident. 2. ASPECTS is 10. These results were called by telephone at the time of interpretation on 01/21/2017 at 10:30 pm to Dr. Fredia Sorrow , who verbally acknowledged these results. Electronically Signed   By: Staci Righter M.D.   On: 01/21/2017 22:36    EKG: Independently reviewed. nsr no acute issues cxr reviewed no edema or infiltrate Old chart reviewed Case discussed with edp  Assessment/Plan 66 yo male with multiple risk factors comes in with symptoms concerning for TIA  Principal Problem:   CVA (cerebral vascular accident) (HCC)/ TIA- obtain mri brain, carotid dopplers and cardiac echo all of which have been ordered.  Place on aspirin daily.  Obtain hga1c, and FLP in the am.  freq neuro checks overnight.  Tele monitoring also overnight.  Symptoms resolved at this time, asked pt to inform us if symptoms return.      Active Problems:   Essential hypertension, benign- stable, allow some permissive htn   Hyperlipidemia- flp in am   DM (diabetes mellitus) ([place])- cont home insulin , place on SSI and qac/qhs sugar checks   Obesity- noted   CKD (chronic kidney disease)- at baseline, cr around 1.9     DVT prophylaxis:  scds Code Status:  full  Family Communication: wife Disposition Plan:  Per day team Consults called:  Teleneuro in ED for code stroke Admission status:  observation   DAVID,RACHAL A MD [facility]  If 7PM-7AM, please contact night-coverage [email or web address] Password [email or web address]  01/22/2017, 12:01 AM

## 2017-01-22 NOTE — Evaluation (Signed)
Occupational Therapy Evaluation Patient Details Name: Benjamin [name] MRN: [id number] DOB: 20-Jul-1951 Today's Date: 01/22/2017    History of Present Illness  Benjamin [name] is a 66 y.o. male with medical history significant of DM, HTN, CHF, HLD comes in withsudden onset of slurred speech/aphasia at around 8pm with associated right facial droop per wife who witnessed event.  Pt reports he got all of a sudden dizzy and could hardly speak.  Felt numbness in his lips, he did not really note any weakness anywhere but his wife felt his right face was drooping down.  This lasted for a couple of hours and spontaneously resolved in the ED.  Code stroke was called, cta was entertained by teleneuro but due to resolution of his symptoms he was felt not to be a tpa candidate and the risk of contrast due to his CKD was not worth pursuing.  Pt is back to normal.  He takes no aspirin products daily.  Pt referred for admission for possible TIA.   Clinical Impression   Pt in bed upon therapy arrival with wife present. Pt was agreeable to participate in OT evaluation. Pt presents with no deficits with strength, ROM, or coordination. Patient is functioning at baseline with all ADL tasks with wife providing assistance for LBD dressing. No further OT needs at this time.     Follow Up Recommendations  No OT follow up    Equipment Recommendations  None recommended by OT       Precautions / Restrictions Precautions Precautions: None Restrictions Weight Bearing Restrictions: No      Mobility Bed Mobility Overal bed mobility: Independent                           ADL either performed or assessed with clinical judgement   ADL Overall ADL's : At baseline                                       General ADL Comments: Patient is mainly independent with assistance as needed for LBD during due to chronic back issues and arthritis.                  Pertinent Vitals/Pain Pain  Assessment: 0-10 Pain Score: 3  Pain Location: Headache Pain Descriptors / Indicators: Aching Pain Intervention(s): Monitored during session;Patient requesting pain meds-RN notified     Hand Dominance Right   Extremity/Trunk Assessment Upper Extremity Assessment Upper Extremity Assessment: Overall WFL for tasks assessed   Lower Extremity Assessment Lower Extremity Assessment: Defer to PT evaluation       Communication Communication Communication: No difficulties   Cognition Arousal/Alertness: Awake/alert Behavior During Therapy: WFL for tasks assessed/performed Overall Cognitive Status: Within Functional Limits for tasks assessed                    Home Living Family/patient expects to be discharged to:: Private residence Living Arrangements: Spouse/significant other   Type of Home: House Home Access: Stairs to enter [employer] of Steps: 5. Handicapped ramp in the back of the house Entrance Stairs-Rails: None Home Layout: Multi-level Alternate Level Stairs-Number of Steps: 20 going upstairs Alternate Level Stairs-Rails: Right Bathroom Shower/Tub: Occupational psychologist: Handicapped height     Home Equipment: Cane - single point          Prior Functioning/Environment Level of Independence:  Needs assistance  Gait / Transfers Assistance Needed: Pt used no device for ambulation. ADL's / Homemaking Assistance Needed: Pt reports that wife would help with LBD occassionally due to chronic back issues and arthritis.   Comments: Retired.                       Barriers to D/C:  None             AM-PAC PT "6 Clicks" Daily Activity     Outcome Measure Help from another person eating meals?: None Help from another person taking care of personal grooming?: None Help from another person toileting, which includes using toliet, bedpan, or urinal?: None Help from another person bathing (including washing, rinsing, drying)?: None Help  from another person to put on and taking off regular upper body clothing?: None Help from another person to put on and taking off regular lower body clothing?: A Little 6 Click Score: 23   End of Session Nurse Communication: Patient requests pain meds  Activity Tolerance: Patient tolerated treatment well Patient left: in bed;with call bell/phone within reach;with family/visitor present  OT Visit Diagnosis: Muscle weakness (generalized) (M62.81)                Time: 0383-3383 OT Time Calculation (min): 15 min Charges:  OT General Charges $OT Visit: 1 Procedure OT Evaluation $OT Eval Low Complexity: 1 Procedure G-Codes: OT G-codes **NOT FOR INPATIENT CLASS** Functional Limitation: Self care Self Care Current Status (A9191): At least 1 percent but less than 20 percent impaired, limited or restricted Self Care Goal Status (Y6060): At least 1 percent but less than 20 percent impaired, limited or restricted Self Care Discharge Status [phone]): At least 1 percent but less than 20 percent impaired, limited or restricted   [name], OTR/L,CBIS  [phone]   [name], [name] 01/22/2017, 9:07 AM

## 2017-01-22 NOTE — Progress Notes (Signed)
SLP Cancellation Note  Patient Details Name: Benjamin [name] MRN: [id number] DOB: 1950/09/26   Cancelled treatment:       Reason Eval/Treat Not Completed: SLP screened, no needs identified, will sign off; SLP screened Pt in room. Pt denies any changes in swallowing, speech, language, or cognition. MRI does show acute occipital stroke, however Pt only notes nausea at this time.[name] SLE will be deferred at this time. Reconsult if indicated. SLP will sign off.   Thank you,  Genene Churn, [place]  [place] 01/22/2017, 12:37 PM

## 2017-01-22 NOTE — Care Management Note (Signed)
Case Management Note  Patient Details  Name: Benjamin [name] MRN: [id number] Date of Birth: April 13, 1951  Subjective/Objective:                  Pt admitted with CVA. Pt is from home, lives with family and ind with ALD's. He has PCP, transportation and insurance with drug coverage. He drives, he has no DME or HH needs. No needs communicated.   Action/Plan: Pt will DC home with self care.   Expected Discharge Date:      01/23/2017            Expected Discharge Plan:  Home/Self Care  In-House Referral:  NA  Discharge planning Services  CM Consult  Post Acute Care Choice:  NA Choice offered to:  NA  Status of Service:  Completed, signed off  Sherald Barge, RN 01/22/2017, 2:10 PM

## 2017-01-22 NOTE — Progress Notes (Signed)
Patient admitted to the hospital earlier this morning by Dr. Shanon Brow.  Patient seen and examined. He is feeling better. Speech appears to have improved. Strength is equal bilaterally.  Patient was found to have acute infarct in right occipital region, confirmed on MRI. He does have multiple risk factors. He does have hyperlipidemia, but reports that he has been intolerant of statins in the past due to development of myalgias. He has not tried pravastatin yet and is willing to try a low-dose of pravastatin. Since this is usually tolerated better than other statins, this would likely be beneficial for him. He does not take a daily aspirin and has been advised to start on a daily aspirin for secondary prevention. Diabetes has been difficult to control, and his insulin needs further adjustment. Dietitian has been asked to see the patient. Carotid Dopplers do not show any significant stenosis bilaterally. Echocardiogram is currently in process. Once this is complete, he can likely be discharged home. Physical therapy and speech therapy as the patient and he does not need any further therapy.  Latricia Cerrito

## 2017-01-23 ENCOUNTER — Inpatient Hospital Stay (HOSPITAL_COMMUNITY): Payer: Medicare HMO

## 2017-01-23 DIAGNOSIS — R9431 Abnormal electrocardiogram [ECG] [EKG]: Secondary | ICD-10-CM

## 2017-01-23 DIAGNOSIS — Z794 Long term (current) use of insulin: Secondary | ICD-10-CM

## 2017-01-23 DIAGNOSIS — E118 Type 2 diabetes mellitus with unspecified complications: Secondary | ICD-10-CM

## 2017-01-23 DIAGNOSIS — N182 Chronic kidney disease, stage 2 (mild): Secondary | ICD-10-CM

## 2017-01-23 LAB — ECHOCARDIOGRAM COMPLETE
E decel time: 169 msec
FS: 35 % (ref 28–44)
HEIGHTINCHES: 74 in
IV/PV OW: 1.22
LA diam index: 1.93 cm/m2
LA vol: 47.1 mL
LASIZE: 51 mm
LAVOLA4C: 48.6 mL
LAVOLIN: 17.9 mL/m2
LDCA: 3.14 cm2
LEFT ATRIUM END SYS DIAM: 51 mm
LV PW d: 12.5 mm — AB (ref 0.6–1.1)
LVOTD: 20 mm
MV Dec: 169
MV Peak grad: 5 mmHg
MV pk A vel: 56.6 m/s
MV pk E vel: 113 m/s
RV LATERAL S' VELOCITY: 16.3 cm/s
TAPSE: 28.1 mm
Weight: 4544 oz

## 2017-01-23 LAB — CBC
HCT: 43.3 % (ref 39.0–52.0)
HEMOGLOBIN: 14.5 g/dL (ref 13.0–17.0)
MCH: 29.5 pg (ref 26.0–34.0)
MCHC: 33.5 g/dL (ref 30.0–36.0)
MCV: 88 fL (ref 78.0–100.0)
PLATELETS: 203 10*3/uL (ref 150–400)
RBC: 4.92 MIL/uL (ref 4.22–5.81)
RDW: 13.7 % (ref 11.5–15.5)
WBC: 14.6 10*3/uL — AB (ref 4.0–10.5)

## 2017-01-23 LAB — BASIC METABOLIC PANEL
ANION GAP: 9 (ref 5–15)
BUN: 21 mg/dL — ABNORMAL HIGH (ref 6–20)
CALCIUM: 8.9 mg/dL (ref 8.9–10.3)
CO2: 30 mmol/L (ref 22–32)
CREATININE: 1.56 mg/dL — AB (ref 0.61–1.24)
Chloride: 98 mmol/L — ABNORMAL LOW (ref 101–111)
GFR, EST AFRICAN AMERICAN: 51 mL/min — AB (ref >60)
GFR, EST NON AFRICAN AMERICAN: 44 mL/min — AB (ref >60)
Glucose, Bld: 153 mg/dL — ABNORMAL HIGH (ref 65–99)
Potassium: 3.1 mmol/L — ABNORMAL LOW (ref 3.5–5.1)
SODIUM: 137 mmol/L (ref 135–145)

## 2017-01-23 LAB — GLUCOSE, CAPILLARY: Glucose-Capillary: 151 mg/dL — ABNORMAL HIGH (ref 65–99)

## 2017-01-23 LAB — HEMOGLOBIN A1C
Hgb A1c MFr Bld: 9.7 % — ABNORMAL HIGH (ref 4.8–5.6)
MEAN PLASMA GLUCOSE: 232 mg/dL

## 2017-01-23 MED ORDER — PRAVASTATIN SODIUM 10 MG PO TABS: 10.0000 mg | ORAL_TABLET | Freq: Every day | ORAL | 0 refills | Status: DC

## 2017-01-23 MED ORDER — ASPIRIN 325 MG PO TABS: 325.0000 mg | ORAL_TABLET | Freq: Every day | ORAL | Status: DC

## 2017-01-23 NOTE — Progress Notes (Signed)
Patient discharged with instructions, prescription, and care notes.  Verbalized understanding via teach back.  IV was removed and the site was WNL. Patient voiced no further complaints or concerns at the time of discharge.  Appointments scheduled per instructions.  Patient left the floor via w/c family  And staff in stable condition. 

## 2017-01-23 NOTE — Discharge Summary (Signed)
Physician Discharge Summary  Benjamin [name]:[id number] DOB: March 05, 1951 DOA: 01/21/2017  PCP: Raquel James, MD at [facility] # [phone] Fax number "unavailable" on website   Admit date: 01/21/2017 Discharge date: 01/23/2017  Recommendations for Outpatient Follow-up:  1. Follow-up stroke care. Started on aspirin, low-dose pravastatin. Titrate up statin if tolerated. Recommend outpatient follow-up with neurology in 2 months, will defer to PCP. 2. Consider repeat basic metabolic panel as an outpatient to assess for chronic kidney disease progression versus resolution of AKI. 3. Ongoing care diabetes mellitus, poorly controlled with hemoglobin A1c 9.7.  4. Chronic leukocytosis dating back to at least 2014. Consider further evaluation as an outpatient.   I have recommended he call his PCP and follow-up in 2 weeks. Follow-up Information    Heyat, Perviz. Schedule an appointment as soon as possible for a visit in 2 week(s).   Specialty:  Internal Medicine         Discharge Diagnoses:  1. Acute right occipital infarct/CVA 2. Essential hypertension 3. Diabetes mellitus type 2 4. Hyperlipidemia 5. Acute kidney injury versus chronic kidney disease stage II  Discharge Condition: improved Disposition: home  Diet recommendation: heart healthy, diabetic diet  Filed Weights   01/21/17 2231  Weight: 128.8 kg (284 lb)    History of present illness:  66 year old man PMH diabetes mellitus type 2, hypertension, hyperlipidemia, presented to the emergency department with sudden onset of slurred speech, right facial droop, dizziness, lip numbness. Code stroke activated, CT head negative, CT angiogram entertained but not done secondary to elevated creatinine and resolved symptoms. Not a candidate for TPA given resolution of symptoms. Admitted for further evaluation for suspected stroke.   Hospital Course:  Patient was admitted for further evaluation for stroke. MRI confirmed acute  infarct right occipital region.Symptoms completely resolved. Bilateral carotid ultrasound was unremarkable. Evaluated by physical and occupational therapy: No needs identified. No further therapy indicated. Screened by SLP, no needs identified. Started on aspirin, low-dose pravastatin. Recommend close outpatient follow-up.   Acute infarct right occipital region. Risk factors hyperlipidemia, diabetes mellitus, essential hypertension. Not previously on aspirin. LDL 77. Bilateral carotid ultrasound unremarkable. Started on aspirin.  Possible acute kidney injury superimposed on possible chronic kidney disease stage II. BUN and creatinine trending down. Follow-up in the outpatient setting.  Diabetes mellitus type 2. Continue NovoLog meal coverage, Lantus without change. Hemoglobin A1c 9.7. Patient reports A1c has been trending downwards.  Essential hypertension. Stable. Continue chlorthalidone, amlodipine, beta blocker.  Mixed hyperlipidemia with statin intolerance (myalgias). Started on low-dose pravastatin trial. Patient will monitor for symptoms and follow-up with his primary care physician.  Grade 2 diastolic dysfunction. Continue losartan, metoprolol.  Consults: None  Procedures: 2-D echocardiogram Study Conclusions  - Left ventricle: The cavity size was normal. Wall thickness was   increased in a pattern of mild LVH. Systolic function was normal.   The estimated ejection fraction was in the range of 60% to 65%.   Wall motion was normal; there were no regional wall motion   abnormalities. The study is not technically sufficient to allow   evaluation of LV diastolic function. - Aortic valve: Moderately calcified annulus. Trileaflet;   moderately thickened leaflets. Valve area (VTI): 2.45 cm^2. Valve   area (Vmax): 2.29 cm^2. - Technically difficult study.  Today's assessment: S: No new issues. No difficulty speaking. All neurologic symptoms completely resolved. O: Vitals:    01/23/17 0000 01/23/17 0400  BP: 130/65 (!) 118/52  Pulse: 84 67  Resp: 18 18  Temp:  98.9 F (37.2 C) 100 F (37.8 C)   Appears calm, comfortable. Cardiovascular regular rate and rhythm. No murmur, rub or gallop. Respiratory clear to auscultation bilaterally. No wheezes, rales or rhonchi. Normal respiratory effort. Neurologic cranial nerves II-12 appear intact. No pronator drift. Sensation grossly intact all extremities. Musculoskeletal grossly normal tone and strength all extremity. Ambulates without difficulty.  Labs:  Capillary blood sugars stable.  BMP: Potassium 3.1. BUN trending down, 21. Creatinine trending down, 1.56  LFTs unremarkable. BNP and troponin unremarkable.  CBC: Chronic leukocytosis dating back to at least 2014.  Urine drug screen positive for benzodiazepines  Imaging studies:   Chest x-ray no acute disease, independently reviewed.  MRI brain: Moderate acute infarct right occipital cortex.  CT head small vessel disease. No acute cortical infarct evident.  Carotid ultrasound mild plaque noted, less than 50% stenosis. Antegrade bilateral vertebral artery flow.  Medical tests:   Echocardiogram reviewed  EKG independently reviewed: Sinus rhythm, PVCs, first-degree AV block (old)  Discharge Instructions  Discharge Instructions    Diet - low sodium heart healthy    Complete by:  As directed    Diet Carb Modified    Complete by:  As directed    Discharge instructions    Complete by:  As directed    Call your physician or seek immediate medical attention for weakness, numbness, difficulty speaking or swallowing or worsening of condition.     Allergies as of 01/23/2017      Reactions   Niacin-lovastatin Er Other (See Comments)   ADVICOR= Body aches   Statins    Body aches   Zocor [simvastatin - High Dose] Other (See Comments)   Body aches.      Medication List    TAKE these medications   acetaminophen 650 MG CR tablet Commonly known as:   TYLENOL Take 650 mg by mouth every 8 (eight) hours as needed for pain.   allopurinol 100 MG tablet Commonly known as:  ZYLOPRIM Take 100 mg by mouth daily.   amLODipine 10 MG tablet Commonly known as:  NORVASC Take 1 tablet (10 mg total) by mouth daily.   aspirin 325 MG tablet Take 1 tablet (325 mg total) by mouth daily. Start taking on:  01/24/2017   chlorthalidone 25 MG tablet Commonly known as:  HYGROTON Take 25 mg by mouth daily.   diazepam 10 MG tablet Commonly known as:  VALIUM Take 10 mg by mouth every 6 (six) hours as needed for anxiety.   insulin glargine 100 UNIT/ML injection Commonly known as:  LANTUS Inject 50 Units into the skin 2 (two) times daily.   losartan 100 MG tablet Commonly known as:  COZAAR Take 1 tablet (100 mg total) by mouth daily.   metoprolol 100 MG tablet Commonly known as:  LOPRESSOR Take 1 tablet (100 mg total) by mouth 2 (two) times daily.   NOVOLOG FLEXPEN 100 UNIT/ML FlexPen Generic drug:  insulin aspart Inject 5-15 Units into the skin 3 (three) times daily with meals. Sliding scale   pantoprazole 40 MG tablet Commonly known as:  PROTONIX Take 1 tablet (40 mg total) by mouth daily.   potassium chloride SA 20 MEQ tablet Commonly known as:  K-DUR,KLOR-CON Take 1 tablet (20 mEq total) by mouth daily.   pravastatin 10 MG tablet Commonly known as:  PRAVACHOL Take 1 tablet (10 mg total) by mouth daily at 6 PM.      Allergies  Allergen Reactions  . Niacin-Lovastatin Er Other (See Comments)  ADVICOR= Body aches  . Statins     Body aches  . Zocor [Simvastatin - High Dose] Other (See Comments)    Body aches.    The results of significant diagnostics from this hospitalization (including imaging, microbiology, ancillary and laboratory) are listed below for reference.    Significant Diagnostic Studies: Dg Chest 2 View  Result Date: 01/21/2017 CLINICAL DATA:  Slurred speech and dizziness EXAM: CHEST  2 VIEW COMPARISON:  Chest  radiograph 04/12/2014 FINDINGS: Unchanged cardiomegaly. No overt pulmonary edema. No focal consolidation, pleural effusion or pneumothorax. IMPRESSION: Cardiomegaly without active cardiopulmonary disease. Electronically Signed   By: Ulyses Jarred M.D.   On: 01/21/2017 22:43   Mr Jodene Nam Head Wo Contrast  Result Date: 01/22/2017 CLINICAL DATA:  Slurred speech, headache, and confusion for 1 day. EXAM: MRI HEAD WITHOUT CONTRAST MRA HEAD WITHOUT CONTRAST TECHNIQUE: Multiplanar, multiecho pulse sequences of the brain and surrounding structures were obtained without intravenous contrast. Angiographic images of the head were obtained using MRA technique without contrast. COMPARISON:  None. FINDINGS: MRI HEAD FINDINGS Brain: Moderate area of restricted diffusion in the right inferior occipital cortex. No superimposed hemorrhage. Confluent FLAIR hyperintensity in the cerebral white matter from chronic small vessel ischemia in this patient with multiple vascular risk factors. Remote small infarct in the upper left cerebellum. Remote lacunar infarct in the right caudate head. No hydrocephalus, mass, or shift. No generalized chronic hemorrhagic foci. Vascular: Arterial findings below. Normal dural venous sinus flow voids Skull and upper cervical spine: Negative Sinuses/Orbits: Negative MRA HEAD FINDINGS Symmetric vertebral arteries. The vertebral and basilar arteries are smooth and widely patent. No flow seen beyond the mid right P2 segment. The left PCA is well-seen. Limited at the level of the M2 branches and A2 branches due to motion artifact. No branch occlusion or proximal stenosis noted in the anterior circulation. No evidence of aneurysm. IMPRESSION: 1. Moderate acute infarct in the inferior right occipital cortex. 2. Right P2 segment occlusion, more proximal than expected for size of infarct. 3. Confluent chronic microvascular ischemia. Electronically Signed   By: Monte Fantasia M.D.   On: 01/22/2017 10:30   Mr Brain  Wo Contrast  Result Date: 01/22/2017 CLINICAL DATA:  Slurred speech, headache, and confusion for 1 day. EXAM: MRI HEAD WITHOUT CONTRAST MRA HEAD WITHOUT CONTRAST TECHNIQUE: Multiplanar, multiecho pulse sequences of the brain and surrounding structures were obtained without intravenous contrast. Angiographic images of the head were obtained using MRA technique without contrast. COMPARISON:  None. FINDINGS: MRI HEAD FINDINGS Brain: Moderate area of restricted diffusion in the right inferior occipital cortex. No superimposed hemorrhage. Confluent FLAIR hyperintensity in the cerebral white matter from chronic small vessel ischemia in this patient with multiple vascular risk factors. Remote small infarct in the upper left cerebellum. Remote lacunar infarct in the right caudate head. No hydrocephalus, mass, or shift. No generalized chronic hemorrhagic foci. Vascular: Arterial findings below. Normal dural venous sinus flow voids Skull and upper cervical spine: Negative Sinuses/Orbits: Negative MRA HEAD FINDINGS Symmetric vertebral arteries. The vertebral and basilar arteries are smooth and widely patent. No flow seen beyond the mid right P2 segment. The left PCA is well-seen. Limited at the level of the M2 branches and A2 branches due to motion artifact. No branch occlusion or proximal stenosis noted in the anterior circulation. No evidence of aneurysm. IMPRESSION: 1. Moderate acute infarct in the inferior right occipital cortex. 2. Right P2 segment occlusion, more proximal than expected for size of infarct. 3. Confluent chronic microvascular ischemia. Electronically  Signed   By: Monte Fantasia M.D.   On: 01/22/2017 10:30   US Carotid Bilateral (at [facility] And [facility] Only)  Result Date: 01/22/2017 CLINICAL DATA:  Previous stroke, right-sided numbness and dizziness. Hypertension, syncope, diabetes, previous tobacco abuse. EXAM: BILATERAL CAROTID DUPLEX ULTRASOUND TECHNIQUE: Pearline Cables scale imaging, color Doppler and duplex  ultrasound was performed of bilateral carotid and vertebral arteries in the neck. COMPARISON:  None. TECHNIQUE: Quantification of carotid stenosis is based on velocity parameters that correlate the residual internal carotid diameter with NASCET-based stenosis levels, using the diameter of the distal internal carotid lumen as the denominator for stenosis measurement. The following velocity measurements were obtained: PEAK SYSTOLIC/END DIASTOLIC RIGHT ICA:                     64/20cm/sec CCA:                     10/27OZ/DGU SYSTOLIC ICA/CCA RATIO:  0.7 DIASTOLIC ICA/CCA RATIO: 0.9 ECA:                     84cm/sec LEFT ICA:                     72/19cm/sec CCA:                     440/34VQ/QVZ SYSTOLIC ICA/CCA RATIO:  0.5 DIASTOLIC ICA/CCA RATIO: 1.0 ECA:                     114Cm/sec FINDINGS: RIGHT CAROTID ARTERY: Mild nonocclusive plaque in the proximal internal and external carotid arteries. No high-grade stenosis. Normal waveforms and color Doppler signal. A nonspecific cardiac arrhythmias noted. RIGHT VERTEBRAL ARTERY:  Normal flow direction and waveform. LEFT CAROTID ARTERY: Mild plaque in the carotid bulb and proximal ICA. No high-grade stenosis. Normal waveforms and color Doppler signal. LEFT VERTEBRAL ARTERY: Normal flow direction and waveform. IMPRESSION: 1. Mild bilateral carotid bifurcation plaque resulting in less than 50% diameter stenosis. 2.  Antegrade bilateral vertebral arterial flow. Electronically Signed   By: Lucrezia Europe M.D.   On: 01/22/2017 13:16   Ct Head Code Stroke W/o Cm  Result Date: 01/21/2017 CLINICAL DATA:  Code stroke. Slurred speech and dizziness began earlier this evening. RIGHT-sided numbness. Facial droop. EXAM: CT HEAD WITHOUT CONTRAST TECHNIQUE: Contiguous axial images were obtained from the base of the skull through the vertex without intravenous contrast. COMPARISON:  None. FINDINGS: Brain: No evidence of acute infarction, hemorrhage, hydrocephalus, extra-axial collection or  mass lesion/mass effect. Normal for age cerebral volume. Fairly extensive hypoattenuation of white matter consistent with small vessel disease, likely combination of hypertension and diabetes. Vascular: No signs of emergent large vessel occlusion. Artifactual proximal basilar artery hyperattenuation appears to be secondary to streak artifact. Skull: Normal. Negative for fracture or focal lesion. Sinuses/Orbits: No acute finding. Other: None. ASPECTS [facility] Stroke Program Early CT Score) - Ganglionic level infarction (caudate, lentiform nuclei, internal capsule, insula, M1-M3 cortex): 7 - Supraganglionic infarction (M4-M6 cortex): 3 Total score (0-10 with 10 being normal): 10 IMPRESSION: 1. Extensive hypoattenuation of the white matter consistent with small vessel disease. No acute cortical infarct is evident. 2. ASPECTS is 10. These results were called by telephone at the time of interpretation on 01/21/2017 at 10:30 pm to Dr. Fredia Sorrow , who verbally acknowledged these results. Electronically Signed   By: Staci Righter M.D.   On: 01/21/2017 22:36    Labs: Basic Metabolic  Panel:  Recent Labs Lab 01/21/17 2232 01/21/17 2243 01/23/17 0442  NA 138 141 137  K 3.4* 3.4* 3.1*  CL 101 99* 98*  CO2 28  --  30  GLUCOSE 175* 175* 153*  BUN 37* 37* 21*  CREATININE 1.86* 1.90* 1.56*  CALCIUM 8.8*  --  8.9   Liver Function Tests:  Recent Labs Lab 01/21/17 2232  AST 35  ALT 43  ALKPHOS 106  BILITOT 0.4  PROT 7.8  ALBUMIN 4.1   CBC:  Recent Labs Lab 01/21/17 2232 01/21/17 2243 01/23/17 0442  WBC 12.2*  --  14.6*  NEUTROABS 6.8  --   --   HGB 13.7 14.3 14.5  HCT 40.7 42.0 43.3  MCV 88.5  --  88.0  PLT 213  --  203     Recent Labs  01/21/17 2232  BNP 33.0   CBG:  Recent Labs Lab 01/22/17 0746 01/22/17 1128 01/22/17 1703 01/22/17 2036 01/23/17 0815  GLUCAP 155* 236* 188* 223* 151*    Principal Problem:   CVA (cerebral vascular accident) ([place]) Active Problems:    Essential hypertension, benign   Hyperlipidemia   DM (diabetes mellitus) ([place])   Obesity   CKD (chronic kidney disease)   Time coordinating discharge: 40 minutes  Signed:  Murray Hodgkins, MD [facility] 01/23/2017, 11:07 AM

## 2017-01-23 NOTE — Progress Notes (Signed)
*  PRELIMINARY RESULTS* Echocardiogram 2D Echocardiogram has been performed by Leavy Cella, RDCS.  Samuel Germany 01/23/2017, 10:43 AM

## 2017-03-19 ENCOUNTER — Encounter: Payer: Self-pay | Admitting: Cardiology

## 2017-03-19 NOTE — Progress Notes (Deleted)
Cardiology Office Note  Date: 03/19/2017   ID: Densil, Ottey 1951-02-04, MRN [id number]  PCP: Ginger Organ  Primary Cardiologist: Rozann Lesches, MD   No chief complaint on file.   History of Present Illness: Benjamin [name] is a 66 y.o. male last seen by Ms. Lawrence DNP in May 2017, I have not seen him since 2015. Records indicate hospitalization in May of this year with a right occipital distribution stroke confirmed by brain MRI. He had no cardiac arrhythmia at that time, no obstructive carotid artery disease by Dopplers. He was placed on aspirin as well as Pravachol, previous history of statin intolerance. Follow-up echocardiogram from May is outlined below.  Past Medical History:  Diagnosis Date  . Arthritis   . Essential hypertension   . Gout   . History of stroke    Right occipital May 2018  . Hypertensive heart disease    LVH with grade 2 diastolic dysfunction  . Mixed hyperlipidemia    Statin intolerance  . Rotator cuff tear    Chronic pain  . Sleep apnea    Possible, uses oxygen at bedtime at times. no formal sleep study.  . Tubular adenoma   . Type 2 diabetes mellitus ([place])     Past Surgical History:  Procedure Laterality Date  . APPENDECTOMY    . COLONOSCOPY N/A 10/29/2013   Dr. [name]- sigmoid polyp status post cold snare removal. large sessile ascending colon polyp. debulked with saline assisted snare polypectomy . pancolonic diverticulosis. inadequate prep. tubular adenoma on bx  . COLONOSCOPY N/A 02/22/2014   Dr. [name]: Saline-assisted snare polypectomy/biopsy for sprawling carpet polyp in the ascending colon which could not be completely removed. Status post biopsy (tubular adenoma), tattooed  . COLONOSCOPY N/A 03/01/2014   ZHG:DJME polypectomy hemorrhage s/p bleeding  . COLONOSCOPY N/A 06/08/2015   Procedure: COLONOSCOPY;  Surgeon: [name], MD;  [facility];  Service: Endoscopy;  Laterality: N/A;  0900  .  ESOPHAGOGASTRODUODENOSCOPY N/A 02/22/2014   Dr. [name]: Erosive reflux esophagitis. Schatzki ring status post dilation. Gastric and duodenal erosions with benign biopsies.  [name] LAPAROSCOPIC RIGHT COLECTOMY N/A 04/16/2014   Procedure: LAPAROSCOPIC ASSISTED RIGHT COLECTOMY;  Surgeon: [name], MD;  Location: [place];  Service: General;  Laterality: N/A;  . [name] DILATION N/A 02/22/2014   Procedure: [name];  Surgeon: [name], MD;  [facility];  Service: Endoscopy;  Laterality: N/A;  . SAVORY DILATION N/A 02/22/2014   Procedure: SAVORY DILATION;  Surgeon: [name], MD;  [facility];  Service: Endoscopy;  Laterality: N/A;    Current Outpatient Prescriptions  Medication Sig Dispense Refill  . acetaminophen (TYLENOL) 650 MG CR tablet Take 650 mg by mouth every 8 (eight) hours as needed for pain.    [name] allopurinol (ZYLOPRIM) 100 MG tablet Take 100 mg by mouth daily.    [name] amLODipine (NORVASC) 10 MG tablet Take 1 tablet (10 mg total) by mouth daily. 30 tablet 6  . aspirin 325 MG tablet Take 1 tablet (325 mg total) by mouth daily.    . chlorthalidone (HYGROTON) 25 MG tablet Take 25 mg by mouth daily.    . diazepam (VALIUM) 10 MG tablet Take 10 mg by mouth every 6 (six) hours as needed for anxiety.    . insulin aspart (NOVOLOG FLEXPEN) 100 UNIT/ML FlexPen Inject 5-15 Units into the skin 3 (three) times daily with meals. Sliding scale    .  insulin glargine (LANTUS) 100 UNIT/ML injection Inject 50 Units into the skin 2 (two) times daily.     [name] losartan (COZAAR) 100 MG tablet Take 1 tablet (100 mg total) by mouth daily. 30 tablet 6  . metoprolol (LOPRESSOR) 100 MG tablet Take 1 tablet (100 mg total) by mouth 2 (two) times daily. 60 tablet 6  . pantoprazole (PROTONIX) 40 MG tablet Take 1 tablet (40 mg total) by mouth daily. 30 tablet 11  . potassium chloride SA (K-DUR,KLOR-CON) 20 MEQ tablet Take 1 tablet (20 mEq total) by mouth daily. 30 tablet 6  . pravastatin  (PRAVACHOL) 10 MG tablet Take 1 tablet (10 mg total) by mouth daily at 6 PM. 30 tablet 0   No current facility-administered medications for this visit.    Allergies:  Niacin-lovastatin er; Statins; and Zocor [simvastatin - high dose]   Social History: The patient  reports that he quit smoking about 41 years ago. His smoking use included Cigarettes. He started smoking about 55 years ago. He has never used smokeless tobacco. He reports that he does not drink alcohol or use drugs.   Family History: The patient's family history includes Diabetes type II in his mother.   ROS:  Please see the history of present illness. Otherwise, complete review of systems is positive for {NONE DEFAULTED:18576::"none"}.  All other systems are reviewed and negative.   Physical Exam: VS:  There were no vitals taken for this visit., BMI There is no height or weight on file to calculate BMI.  Wt Readings from Last 3 Encounters:  01/21/17 284 lb (128.8 kg)  02/09/16 265 lb (120.2 kg)  08/09/15 264 lb 6.4 oz (119.9 kg)    General: Patient appears comfortable at rest. HEENT: Conjunctiva and lids normal, oropharynx clear with moist mucosa. Neck: Supple, no elevated JVP or carotid bruits, no thyromegaly. Lungs: Clear to auscultation, nonlabored breathing at rest. Cardiac: Regular rate and rhythm, no S3 or significant systolic murmur, no pericardial rub. Abdomen: Soft, nontender, no hepatomegaly, bowel sounds present, no guarding or rebound. Extremities: No pitting edema, distal pulses 2+. Skin: Warm and dry. Musculoskeletal: No kyphosis. Neuropsychiatric: Alert and oriented x3, affect grossly appropriate.  ECG: I personally reviewed the tracing from 01/21/2017 showed sinus rhythm with IVCD, PVCs, prolonged PR interval, R' in lead V1.  Recent Labwork: 01/21/2017: ALT 43; AST 35; B Natriuretic Peptide 33.0 01/23/2017: BUN 21; Creatinine, Ser 1.56; Hemoglobin 14.5; Platelets 203; Potassium 3.1; Sodium 137     Component  Value Date/Time   CHOL 180 01/22/2017 0506   TRIG 391 (H) 01/22/2017 0506   HDL 25 (L) 01/22/2017 0506   CHOLHDL 7.2 01/22/2017 0506   VLDL 78 (H) 01/22/2017 0506   LDLCALC 77 01/22/2017 0506    Other Studies Reviewed Today:  Echocardiogram 01/23/2017: Study Conclusions  - Left ventricle: The cavity size was normal. Wall thickness was   increased in a pattern of mild LVH. Systolic function was normal.   The estimated ejection fraction was in the range of 60% to 65%.   Wall motion was normal; there were no regional wall motion   abnormalities. The study is not technically sufficient to allow   evaluation of LV diastolic function. - Aortic valve: Moderately calcified annulus. Trileaflet;   moderately thickened leaflets. Valve area (VTI): 2.45 cm^2. Valve   area (Vmax): 2.29 cm^2. - Technically difficult study.  Carotid Dopplers 01/22/2017: IMPRESSION: 1. Mild bilateral carotid bifurcation plaque resulting in less than 50% diameter stenosis. 2.  Antegrade bilateral vertebral  arterial flow.  Assessment and Plan:    Current medicines were reviewed with the patient today.  No orders of the defined types were placed in this encounter.   Disposition:  Signed, Satira Sark, MD, [facility] 03/19/2017 9:03 AM    [facility] [place], [place], [place] [address] Phone: [phone]; Fax: [phone]

## 2017-03-21 ENCOUNTER — Ambulatory Visit: Payer: Medicare HMO | Admitting: Cardiology

## 2017-04-10 NOTE — Progress Notes (Signed)
Cardiology Office Note  Date: 04/12/2017   ID: Anil, Havard Dec 01, 1950, MRN [id number]  PCP: [facility]  Primary Cardiologist: Rozann Lesches, MD   Chief Complaint  Patient presents with  . Follow-up hypertension    History of Present Illness: Benjamin [name] is a 66 y.o. male that I have not seen in the office since 2015. Most recent visit was with Ms. Lawrence DNP in May 2017. Records indicate hospitalization in May of this year with an acute right occipital region stroke confirmed by brain MRI. Carotid Dopplers did not demonstrate obstructive plaque, he was treated with aspirin. No cardiac arrhythmias were noted.  He is here today with his wife for a follow-up visit. States that he just had a visit through the [facility] clinic yesterday and a 14 day heart monitor was placed. He has had some intermittent episodes of lightheadedness that sound orthostatic, sometimes a vague feeling of palpitations. These two symptoms are not necessarily associated however.  I reviewed his medications which are outlined below. He is on four antihypertensives. We did discuss possibly cutting back his Norvasc if blood pressure remains low normal range with intermittent lightheadedness.  Follow-up echocardiogram from May is outlined below. LVEF 60-65% with no major valvular abnormalities.  Past Medical History:  Diagnosis Date  . Arthritis   . Essential hypertension   . Gout   . History of stroke    Right occipital May 2018  . Hypertensive heart disease    LVH with grade 2 diastolic dysfunction  . Mixed hyperlipidemia    Statin intolerance  . Rotator cuff tear    Chronic pain  . Sleep apnea    Possible, uses oxygen at bedtime at times. no formal sleep study.  . Tubular adenoma   . Type 2 diabetes mellitus ([place])     Past Surgical History:  Procedure Laterality Date  . APPENDECTOMY    . COLONOSCOPY N/A 10/29/2013   Dr. [name]- sigmoid polyp status post cold  snare removal. large sessile ascending colon polyp. debulked with saline assisted snare polypectomy . pancolonic diverticulosis. inadequate prep. tubular adenoma on bx  . COLONOSCOPY N/A 02/22/2014   Dr. [name]: Saline-assisted snare polypectomy/biopsy for sprawling carpet polyp in the ascending colon which could not be completely removed. Status post biopsy (tubular adenoma), tattooed  . COLONOSCOPY N/A 03/01/2014   TTS:VXBL polypectomy hemorrhage s/p bleeding  . COLONOSCOPY N/A 06/08/2015   Procedure: COLONOSCOPY;  Surgeon: [name], MD;  [facility];  Service: Endoscopy;  Laterality: N/A;  0900  . ESOPHAGOGASTRODUODENOSCOPY N/A 02/22/2014   Dr. [name]: Erosive reflux esophagitis. Schatzki ring status post dilation. Gastric and duodenal erosions with benign biopsies.  [name] LAPAROSCOPIC RIGHT COLECTOMY N/A 04/16/2014   Procedure: LAPAROSCOPIC ASSISTED RIGHT COLECTOMY;  Surgeon: [name], MD;  Location: [facility];  Service: General;  Laterality: N/A;  . [name] DILATION N/A 02/22/2014   Procedure: [name];  Surgeon: [name], MD;  [facility];  Service: Endoscopy;  Laterality: N/A;  . SAVORY DILATION N/A 02/22/2014   Procedure: SAVORY DILATION;  Surgeon: [name], MD;  [facility];  Service: Endoscopy;  Laterality: N/A;    Current Outpatient Prescriptions  Medication Sig Dispense Refill  . acetaminophen (TYLENOL) 650 MG CR tablet Take 650 mg by mouth every 8 (eight) hours as needed for pain.    [name] allopurinol (ZYLOPRIM) 100 MG tablet Take 100 mg by mouth daily.    [name]  amLODipine (NORVASC) 10 MG tablet Take 1 tablet (10 mg total) by mouth daily. 30 tablet 6  . aspirin 325 MG tablet Take 1 tablet (325 mg total) by mouth daily.    . chlorthalidone (HYGROTON) 25 MG tablet Take 25 mg by mouth daily.    . diazepam (VALIUM) 10 MG tablet Take 10 mg by mouth every 6 (six) hours as needed for anxiety.    . insulin aspart (NOVOLOG FLEXPEN) 100 UNIT/ML  FlexPen Inject 5-15 Units into the skin 3 (three) times daily with meals. Sliding scale    . insulin glargine (LANTUS) 100 UNIT/ML injection Inject 50 Units into the skin 2 (two) times daily.     [name] losartan (COZAAR) 100 MG tablet Take 1 tablet (100 mg total) by mouth daily. 30 tablet 6  . metoprolol (LOPRESSOR) 100 MG tablet Take 1 tablet (100 mg total) by mouth 2 (two) times daily. 60 tablet 6  . pantoprazole (PROTONIX) 40 MG tablet Take 1 tablet (40 mg total) by mouth daily. 30 tablet 11  . potassium chloride SA (K-DUR,KLOR-CON) 20 MEQ tablet Take 1 tablet (20 mEq total) by mouth daily. 30 tablet 6  . pravastatin (PRAVACHOL) 10 MG tablet Take 1 tablet (10 mg total) by mouth daily at 6 PM. 30 tablet 0   No current facility-administered medications for this visit.    Allergies:  Niacin-lovastatin er; Statins; and Zocor [simvastatin - high dose]   Social History: The patient  reports that he quit smoking about 41 years ago. His smoking use included Cigarettes. He started smoking about 55 years ago. He has never used smokeless tobacco. He reports that he does not drink alcohol or use drugs.   ROS:  Please see the history of present illness. Otherwise, complete review of systems is positive for intermittent lightheadedness.  All other systems are reviewed and negative.   Physical Exam: VS:  BP 106/62   Pulse (!) 56   Ht 6\' 2"  (1.88 m)   Wt 270 lb (122.5 kg)   SpO2 96%   BMI 34.67 kg/m , BMI Body mass index is 34.67 kg/m.  Wt Readings from Last 3 Encounters:  04/12/17 270 lb (122.5 kg)  01/21/17 284 lb (128.8 kg)  02/09/16 265 lb (120.2 kg)    Obese male in NAD.  HEENT: Conjunctiva and lids normal, oropharynx clear.  Neck: Supple, no elevated JVP or carotid bruits, no thyromegaly.  Lungs: Clear to auscultation, nonlabored breathing at rest.  Cardiac: Regular rate and rhythm, no S3, 2/6 systolic murmur, no pericardial rub.  Abdomen: Soft, nontender, bowel sounds present, no guarding or  rebound.  Extremities: No pitting edema, distal pulses 2+.  Skin: Warm and dry.  Musculoskeletal: No kyphosis.  Neuropsychiatric: Alert and oriented x3, affect grossly appropriate.  ECG: I personally reviewed the tracing from 01/21/2014 which showed sinus rhythm with prolonged PR interval, IVCD, and occasional PVCs.  Recent Labwork: 01/21/2017: ALT 43; AST 35; B Natriuretic Peptide 33.0 01/23/2017: BUN 21; Creatinine, Ser 1.56; Hemoglobin 14.5; Platelets 203; Potassium 3.1; Sodium 137     Component Value Date/Time   CHOL 180 01/22/2017 0506   TRIG 391 (H) 01/22/2017 0506   HDL 25 (L) 01/22/2017 0506   CHOLHDL 7.2 01/22/2017 0506   VLDL 78 (H) 01/22/2017 0506   LDLCALC 77 01/22/2017 0506    Other Studies Reviewed Today:  Echocardiogram 01/23/2017: Study Conclusions  - Left ventricle: The cavity size was normal. Wall thickness was   increased in a pattern of mild LVH.  Systolic function was normal.   The estimated ejection fraction was in the range of 60% to 65%.   Wall motion was normal; there were no regional wall motion   abnormalities. The study is not technically sufficient to allow   evaluation of LV diastolic function. - Aortic valve: Moderately calcified annulus. Trileaflet;   moderately thickened leaflets. Valve area (VTI): 2.45 cm^2. Valve   area (Vmax): 2.29 cm^2. - Technically difficult study.  Carotid Dopplers 01/22/2017: IMPRESSION: 1. Mild bilateral carotid bifurcation plaque resulting in less than 50% diameter stenosis. 2.  Antegrade bilateral vertebral arterial flow.  Assessment and Plan:  1. Essential hypertension. Blood pressure is low normal today. He is on Lopressor, Cozaar, chlorthalidone, and Norvasc. Follows with La[facility] Pickett and also the [facility] system. If blood pressure remains low normal range and he is having intermittent lightheadedness, may need to consider cutting back Norvasc to 5 mg daily.  2. Vague history of palpitations, no arrhythmias uncovered  with his stroke back in May of this year. He is now wearing a 14 day event recorder through the [facility] system.  3. Hyperlipidemia, on Pravachol.  4. Nonobstructive carotid atherosclerosis, less than 50% stenoses by carotid Dopplers in May. He is on aspirin and statin.  5. History of right occipital region stroke in May of this year.  Current medicines were reviewed with the patient today.  Disposition: Follow-up in 6 months.  Signed, Satira Sark, MD, [facility] 04/12/2017 9:04 AM    [facility] at [facility] [address] [place], [place] [address] Phone: [phone]; Fax: [phone]

## 2017-04-12 ENCOUNTER — Ambulatory Visit (INDEPENDENT_AMBULATORY_CARE_PROVIDER_SITE_OTHER): Payer: Medicare HMO | Admitting: Cardiology

## 2017-04-12 ENCOUNTER — Encounter: Payer: Self-pay | Admitting: Cardiology

## 2017-04-12 VITALS — BP 106/62 | HR 56 | Ht 74.0 in | Wt 270.0 lb

## 2017-04-12 DIAGNOSIS — I10 Essential (primary) hypertension: Secondary | ICD-10-CM

## 2017-04-12 DIAGNOSIS — I119 Hypertensive heart disease without heart failure: Secondary | ICD-10-CM

## 2017-04-12 DIAGNOSIS — Z8673 Personal history of transient ischemic attack (TIA), and cerebral infarction without residual deficits: Secondary | ICD-10-CM | POA: Diagnosis not present

## 2017-04-12 DIAGNOSIS — I779 Disorder of arteries and arterioles, unspecified: Secondary | ICD-10-CM | POA: Diagnosis not present

## 2017-04-12 DIAGNOSIS — R002 Palpitations: Secondary | ICD-10-CM | POA: Diagnosis not present

## 2017-04-12 DIAGNOSIS — I739 Peripheral vascular disease, unspecified: Secondary | ICD-10-CM

## 2017-04-12 NOTE — Patient Instructions (Signed)
Your physician wants you to follow-up in: 6 months Dr Ferne Reus will receive a reminder letter in the mail two months in advance. If you don't receive a letter, please call our office to schedule the follow-up appointment.   Your physician recommends that you continue on your current medications as directed. Please refer to the Current Medication list given to you today.    Please end [place] results of her heart monitor from the [place]    We signed your DMV form today     Thank you for choosing [facility] !

## 2017-10-02 DIAGNOSIS — E1129 Type 2 diabetes mellitus with other diabetic kidney complication: Secondary | ICD-10-CM | POA: Diagnosis not present

## 2017-11-05 DIAGNOSIS — E1165 Type 2 diabetes mellitus with hyperglycemia: Secondary | ICD-10-CM | POA: Diagnosis not present

## 2017-12-16 NOTE — Progress Notes (Signed)
Cardiology Office Note    Date:  12/17/2017   ID:  Benjamin [name], Benjamin [name] [birth year], MRN [id number]  PCP:  [facility]  Cardiologist: Rozann Lesches, MD    Chief Complaint  Patient presents with  . Follow-up    6 month visit    History of Present Illness:    Benjamin [name] is a 67 y.o. male with past medical history of carotid artery stenosis, HTN, HLD, Type 2 DM,  palpitations, Stage 3 CKD and prior CVA who presents to the office today for 1-month follow-up.   He was last examined by Dr. Domenic Polite in 03/2017 and reported intermittent palpitations, with an event monitor having been placed by the [facility]. He denied any chest pain or dyspnea on exertion. Was continued on his current medication regimen at that time.   In talking with the patient today, he reports overall doing well from a cardiac perspective since his last office visit. He does not exercise regularly but is active in performing household chores and taking care of the yard. He denies any episodes of exertional chest pain or dyspnea on exertion. He does experience an occasional twinge of pain along his left pectoral region which lasts for 30 seconds to a minute. This usually occurs at rest and spontaneously resolves. No association with exertion or positional changes.  He denies any recent dyspnea on exertion, orthopnea, PND, or lower extremity edema.  Reports weight has been stable on his home scales.  He does have known Stage 3 CKD and this is followed by Nephrology at the [facility].    Past Medical History:  Diagnosis Date  . Arthritis   . Essential hypertension   . Gout   . History of stroke    Right occipital May 2018  . Hypertensive heart disease    LVH with grade 2 diastolic dysfunction  . Mixed hyperlipidemia    Statin intolerance  . Rotator cuff tear    Chronic pain  . Sleep apnea    a. intolerant to CPAP  . Tubular adenoma   . Type 2 diabetes mellitus ([place])     Past Surgical  History:  Procedure Laterality Date  . APPENDECTOMY    . COLONOSCOPY N/A 10/29/2013   Dr. [name]- sigmoid polyp status post cold snare removal. large sessile ascending colon polyp. debulked with saline assisted snare polypectomy . pancolonic diverticulosis. inadequate prep. tubular adenoma on bx  . COLONOSCOPY N/A 02/22/2014   Dr. [name]: Saline-assisted snare polypectomy/biopsy for sprawling carpet polyp in the ascending colon which could not be completely removed. Status post biopsy (tubular adenoma), tattooed  . COLONOSCOPY N/A 03/01/2014   XIP:JASN polypectomy hemorrhage s/p bleeding  . COLONOSCOPY N/A 06/08/2015   Procedure: COLONOSCOPY;  Surgeon: [name], MD;  [facility];  Service: Endoscopy;  Laterality: N/A;  0900  . ESOPHAGOGASTRODUODENOSCOPY N/A 02/22/2014   Dr. [name]: Erosive reflux esophagitis. Schatzki ring status post dilation. Gastric and duodenal erosions with benign biopsies.  [name] LAPAROSCOPIC RIGHT COLECTOMY N/A 04/16/2014   Procedure: LAPAROSCOPIC ASSISTED RIGHT COLECTOMY;  Surgeon: [name], MD;  Location: [place];  Service: General;  Laterality: N/A;  . [name] DILATION N/A 02/22/2014   Procedure: [name];  Surgeon: [name], MD;  [facility];  Service: Endoscopy;  Laterality: N/A;  . SAVORY DILATION N/A 02/22/2014   Procedure: SAVORY DILATION;  Surgeon: [name], MD;  [facility];  Service: Endoscopy;  Laterality: N/A;  Current Medications: Outpatient Medications Prior to Visit  Medication Sig Dispense Refill  . acetaminophen (TYLENOL) 650 MG CR tablet Take 650 mg by mouth every 8 (eight) hours as needed for pain.    [name] allopurinol (ZYLOPRIM) 100 MG tablet Take 100 mg by mouth daily.    [name] amLODipine (NORVASC) 10 MG tablet Take 1 tablet (10 mg total) by mouth daily. 30 tablet 6  . aspirin 325 MG tablet Take 1 tablet (325 mg total) by mouth daily.    . chlorthalidone (HYGROTON) 25 MG tablet Take 25 mg by mouth  daily.    . diazepam (VALIUM) 10 MG tablet Take 10 mg by mouth every 6 (six) hours as needed for anxiety.    . insulin glargine (LANTUS) 100 UNIT/ML injection Inject 50 Units into the skin 2 (two) times daily.     [name] losartan (COZAAR) 100 MG tablet Take 1 tablet (100 mg total) by mouth daily. 30 tablet 6  . metoprolol (LOPRESSOR) 100 MG tablet Take 1 tablet (100 mg total) by mouth 2 (two) times daily. 60 tablet 6  . pantoprazole (PROTONIX) 40 MG tablet Take 1 tablet (40 mg total) by mouth daily. 30 tablet 11  . potassium chloride SA (K-DUR,KLOR-CON) 20 MEQ tablet Take 1 tablet (20 mEq total) by mouth daily. 30 tablet 6  . pravastatin (PRAVACHOL) 10 MG tablet Take 1 tablet (10 mg total) by mouth daily at 6 PM. 30 tablet 0  . Semaglutide (OZEMPIC) 1 MG/DOSE SOPN Inject 1 mg into the skin once a week.    . insulin aspart (NOVOLOG FLEXPEN) 100 UNIT/ML FlexPen Inject 5-15 Units into the skin 3 (three) times daily with meals. Sliding scale     No facility-administered medications prior to visit.      Allergies:   Niacin-lovastatin er; Statins; and Zocor [simvastatin - high dose]   Social History   Socioeconomic History  . Marital status: Married    Spouse name: Not on file  . Number of children: 2  . Years of education: Not on file  . Highest education level: Not on file  Occupational History  . Occupation: Programmer, systems: [employer]   Social Needs  . Financial resource strain: Not on file  . Food insecurity:    Worry: Not on file    Inability: Not on file  . Transportation needs:    Medical: Not on file    Non-medical: Not on file  Tobacco Use  . Smoking status: Former Smoker    Types: Cigarettes    Start date: 09/17/1961    Last attempt to quit: 09/18/1975    Years since quitting: 42.2  . Smokeless tobacco: Never Used  . Tobacco comment: quit 35 years ago  Substance and Sexual Activity  . Alcohol use: No    Alcohol/week: 0.0 oz    Comment: None in over a year. prior to  that 1-2 beers couple times per week.  . Drug use: No  . Sexual activity: Not on file  Lifestyle  . Physical activity:    Days per week: Not on file    Minutes per session: Not on file  . Stress: Not on file  Relationships  . Social connections:    Talks on phone: Not on file    Gets together: Not on file    Attends religious service: Not on file    Active member of club or organization: Not on file    Attends meetings of clubs or organizations: Not  on file    Relationship status: Not on file  Other Topics Concern  . Not on file  Social History Narrative  . Not on file     Family History:  The patient's family history includes Diabetes type II in his mother.   Review of Systems:   Please see the history of present illness.     General:  No chills, fever, night sweats or weight changes.  Cardiovascular:  No  dyspnea on exertion, edema, orthopnea, palpitations, paroxysmal nocturnal dyspnea. Positive for chest pain.  Dermatological: No rash, lesions/masses Respiratory: No cough, dyspnea Urologic: No hematuria, dysuria Abdominal:   No nausea, vomiting, diarrhea, bright red blood per rectum, melena, or hematemesis Neurologic:  No visual changes, wkns, changes in mental status.  All other systems reviewed and are otherwise negative except as noted above.   Physical Exam:    VS:  BP 120/72   Pulse 64   Ht 6\' 1"  (1.854 m)   Wt 274 lb (124.3 kg)   SpO2 93%   BMI 36.15 kg/m    General: Well developed, overweight Caucasian male appearing in no acute distress. Head: Normocephalic, atraumatic, sclera non-icteric, no xanthomas, nares are without discharge.  Neck: No carotid bruits. JVD not elevated.  Lungs: Respirations regular and unlabored, without wheezes or rales.  Heart: Regular rate and rhythm with occasional PAC's. No S3 or S4.  No murmur, no rubs, or gallops appreciated. Abdomen: Soft, non-tender, non-distended with normoactive bowel sounds. No hepatomegaly. No  rebound/guarding. No obvious abdominal masses. Msk:  Strength and tone appear normal for age. No joint deformities or effusions. Extremities: No clubbing or cyanosis. No lower extremity edema.  Distal pedal pulses are 2+ bilaterally. Neuro: Alert and oriented X 3. Moves all extremities spontaneously. No focal deficits noted. Psych:  Responds to questions appropriately with a normal affect. Skin: No rashes or lesions noted  Wt Readings from Last 3 Encounters:  12/17/17 274 lb (124.3 kg)  04/12/17 270 lb (122.5 kg)  01/21/17 284 lb (128.8 kg)     Studies/Labs Reviewed:   EKG:  EKG is ordered today. The ekg ordered today demonstrates sinus bradycardia, HR 51 with 1st degree AV block and frequent PAC's. RBBB.   Recent Labs: 01/21/2017: ALT 43; B Natriuretic Peptide 33.0 01/23/2017: BUN 21; Creatinine, Ser 1.56; Hemoglobin 14.5; Platelets 203; Potassium 3.1; Sodium 137   Lipid Panel    Component Value Date/Time   CHOL 180 01/22/2017 0506   TRIG 391 (H) 01/22/2017 0506   HDL 25 (L) 01/22/2017 0506   CHOLHDL 7.2 01/22/2017 0506   VLDL 78 (H) 01/22/2017 0506   LDLCALC 77 01/22/2017 0506    Additional studies/ records that were reviewed today include:   Echocardiogram: 01/2017 Study Conclusions  - Left ventricle: The cavity size was normal. Wall thickness was   increased in a pattern of mild LVH. Systolic function was normal.   The estimated ejection fraction was in the range of 60% to 65%.   Wall motion was normal; there were no regional wall motion   abnormalities. The study is not technically sufficient to allow   evaluation of LV diastolic function. - Aortic valve: Moderately calcified annulus. Trileaflet;   moderately thickened leaflets. Valve area (VTI): 2.45 cm^2. Valve   area (Vmax): 2.29 cm^2. - Technically difficult study.  Assessment:    1. Hypertensive heart disease without heart failure   2. Essential hypertension   3. Atypical chest pain   4. Palpitations   5.  Hyperlipidemia, unspecified  hyperlipidemia type   6. OSA (obstructive sleep apnea)      Plan:   In order of problems listed above:  1. Hypertensive Heart Disease/ Essential HTN - The patient denies any recent dyspnea on exertion, orthopnea, or PND and volume status appears at baseline on physical examination. Recent echo showed a preserved EF of 60 to 65% with no regional wall motion abnormalities. - BP remains well controlled at 120/72 during today's visit. - Continue current medication regimen with Amlodipine 10 mg daily, Chlorthalidone 25 mg daily, Losartan 100 mg daily, and Lopressor 100 mg twice daily. Recent BMET checked by the [facility] per his report and showed stable kidney function (has known Stage 3 CKD).   2. Atypical Chest Pain - reports occasional twinges of pain along his left pectoral region which lasts for 30 seconds to a minute and spontaneously resolves.  No association with exertion or positional changes. EKG shows no acute ischemic changes when compared to prior tracings.  - Overall, this seems atypical for a cardiac etiology. Due to his multiple cardiac risk factors, we discussed ischemic evaluation with a stress test but he declines at this time as he reports his symptoms are overall not bothersome. I informed him to make [place] aware if this intensifies or changes in duration.   3. Palpitations - Had an event monitor placed last year by the [facility] system which showed no acute findings per his report. He is noted to have PAC's on his EKG today but denies any symptoms with this. - He does not consume caffeine or alcohol regularly. Labs recently checked by his PCP (will request these records).  - Continue Lopressor 100 mg twice daily.  4. HLD - Followed by PCP. Remains on Pravastatin 10 mg daily. Was previously intolerant to Atorvastatin and Zocor.   5. OSA - diagnosed by prior sleep study. He has been intolerant to CPAP due to claustrophobia.   Medication Adjustments/Labs  and Tests Ordered: Current medicines are reviewed at length with the patient today.  Concerns regarding medicines are outlined above.  Medication changes, Labs and Tests ordered today are listed in the Patient Instructions below. Patient Instructions  Medication Instructions:  Your physician recommends that you continue on your current medications as directed. Please refer to the Current Medication list given to you today.  Labwork: NONE   Testing/Procedures: NONE   Follow-Up: Your physician wants you to follow-up in: 6 Months with Dr. Domenic Polite.  You will receive a reminder letter in the mail two months in advance. If you don't receive a letter, please call our office to schedule the follow-up appointment.  Any Other Special Instructions Will Be Listed Below (If Applicable).  If you need a refill on your cardiac medications before your next appointment, please call your pharmacy. Thank you for choosing [facility]!     Signed, Erma Heritage, PA-C  12/17/2017 3:18 PM    [address] [place], [place] [address] Phone: [phone]

## 2017-12-17 ENCOUNTER — Encounter: Payer: Self-pay | Admitting: Student

## 2017-12-17 ENCOUNTER — Ambulatory Visit (INDEPENDENT_AMBULATORY_CARE_PROVIDER_SITE_OTHER): Payer: Medicare HMO | Admitting: Student

## 2017-12-17 VITALS — BP 120/72 | HR 64 | Ht 73.0 in | Wt 274.0 lb

## 2017-12-17 DIAGNOSIS — I10 Essential (primary) hypertension: Secondary | ICD-10-CM | POA: Diagnosis not present

## 2017-12-17 DIAGNOSIS — E785 Hyperlipidemia, unspecified: Secondary | ICD-10-CM | POA: Diagnosis not present

## 2017-12-17 DIAGNOSIS — I119 Hypertensive heart disease without heart failure: Secondary | ICD-10-CM | POA: Diagnosis not present

## 2017-12-17 DIAGNOSIS — G4733 Obstructive sleep apnea (adult) (pediatric): Secondary | ICD-10-CM | POA: Diagnosis not present

## 2017-12-17 DIAGNOSIS — R0789 Other chest pain: Secondary | ICD-10-CM | POA: Diagnosis not present

## 2017-12-17 DIAGNOSIS — R002 Palpitations: Secondary | ICD-10-CM

## 2017-12-17 NOTE — Patient Instructions (Signed)
Medication Instructions:  Your physician recommends that you continue on your current medications as directed. Please refer to the Current Medication list given to you today.   Labwork: NONE  Testing/Procedures: NONE  Follow-Up: Your physician wants you to follow-up in: 6 Months with Dr. McDowell. You will receive a reminder letter in the mail two months in advance. If you don't receive a letter, please call our office to schedule the follow-up appointment.   Any Other Special Instructions Will Be Listed Below (If Applicable).     If you need a refill on your cardiac medications before your next appointment, please call your pharmacy. Thank you for choosing [facility]!    

## 2018-01-08 DIAGNOSIS — Z0000 Encounter for general adult medical examination without abnormal findings: Secondary | ICD-10-CM | POA: Diagnosis not present

## 2018-01-08 DIAGNOSIS — E6609 Other obesity due to excess calories: Secondary | ICD-10-CM | POA: Diagnosis not present

## 2018-01-08 DIAGNOSIS — Z6834 Body mass index (BMI) 34.0-34.9, adult: Secondary | ICD-10-CM | POA: Diagnosis not present

## 2018-01-08 DIAGNOSIS — Z1389 Encounter for screening for other disorder: Secondary | ICD-10-CM | POA: Diagnosis not present

## 2018-05-13 ENCOUNTER — Encounter (HOSPITAL_COMMUNITY): Payer: Self-pay | Admitting: *Deleted

## 2018-05-13 ENCOUNTER — Emergency Department (HOSPITAL_COMMUNITY)
Admission: EM | Admit: 2018-05-13 | Discharge: 2018-05-13 | Disposition: A | Discharge status: Home / Self Care | Payer: Medicare HMO | Attending: Emergency Medicine | Admitting: Emergency Medicine

## 2018-05-13 ENCOUNTER — Emergency Department (HOSPITAL_COMMUNITY): Payer: Medicare HMO

## 2018-05-13 DIAGNOSIS — M79672 Pain in left foot: Secondary | ICD-10-CM | POA: Diagnosis present

## 2018-05-13 DIAGNOSIS — Z7902 Long term (current) use of antithrombotics/antiplatelets: Secondary | ICD-10-CM | POA: Insufficient documentation

## 2018-05-13 DIAGNOSIS — E6609 Other obesity due to excess calories: Secondary | ICD-10-CM | POA: Diagnosis not present

## 2018-05-13 DIAGNOSIS — S91102A Unspecified open wound of left great toe without damage to nail, initial encounter: Secondary | ICD-10-CM | POA: Diagnosis not present

## 2018-05-13 DIAGNOSIS — E1122 Type 2 diabetes mellitus with diabetic chronic kidney disease: Secondary | ICD-10-CM | POA: Diagnosis not present

## 2018-05-13 DIAGNOSIS — Z794 Long term (current) use of insulin: Secondary | ICD-10-CM | POA: Diagnosis not present

## 2018-05-13 DIAGNOSIS — Z87891 Personal history of nicotine dependence: Secondary | ICD-10-CM | POA: Diagnosis not present

## 2018-05-13 DIAGNOSIS — N189 Chronic kidney disease, unspecified: Secondary | ICD-10-CM | POA: Insufficient documentation

## 2018-05-13 DIAGNOSIS — L089 Local infection of the skin and subcutaneous tissue, unspecified: Secondary | ICD-10-CM

## 2018-05-13 DIAGNOSIS — Z79899 Other long term (current) drug therapy: Secondary | ICD-10-CM | POA: Insufficient documentation

## 2018-05-13 DIAGNOSIS — Z6833 Body mass index (BMI) 33.0-33.9, adult: Secondary | ICD-10-CM | POA: Diagnosis not present

## 2018-05-13 DIAGNOSIS — Z1389 Encounter for screening for other disorder: Secondary | ICD-10-CM | POA: Diagnosis not present

## 2018-05-13 DIAGNOSIS — I129 Hypertensive chronic kidney disease with stage 1 through stage 4 chronic kidney disease, or unspecified chronic kidney disease: Secondary | ICD-10-CM | POA: Insufficient documentation

## 2018-05-13 DIAGNOSIS — Z7982 Long term (current) use of aspirin: Secondary | ICD-10-CM | POA: Diagnosis not present

## 2018-05-13 DIAGNOSIS — L03032 Cellulitis of left toe: Secondary | ICD-10-CM | POA: Insufficient documentation

## 2018-05-13 LAB — CBC WITH DIFFERENTIAL/PLATELET
Basophils Absolute: 0 10*3/uL (ref 0.0–0.1)
Basophils Relative: 0 %
Eosinophils Absolute: 0.3 10*3/uL (ref 0.0–0.7)
Eosinophils Relative: 2 %
HCT: 42.8 % (ref 39.0–52.0)
Hemoglobin: 14.4 g/dL (ref 13.0–17.0)
Lymphocytes Relative: 33 %
Lymphs Abs: 4.5 10*3/uL — ABNORMAL HIGH (ref 0.7–4.0)
MCH: 29.7 pg (ref 26.0–34.0)
MCHC: 33.6 g/dL (ref 30.0–36.0)
MCV: 88.2 fL (ref 78.0–100.0)
Monocytes Absolute: 1.5 10*3/uL — ABNORMAL HIGH (ref 0.1–1.0)
Monocytes Relative: 11 %
Neutro Abs: 7.2 10*3/uL (ref 1.7–7.7)
Neutrophils Relative %: 54 %
Platelets: 217 10*3/uL (ref 150–400)
RBC: 4.85 MIL/uL (ref 4.22–5.81)
RDW: 14.4 % (ref 11.5–15.5)
WBC: 13.4 10*3/uL — ABNORMAL HIGH (ref 4.0–10.5)

## 2018-05-13 LAB — BASIC METABOLIC PANEL
Anion gap: 7 (ref 5–15)
BUN: 26 mg/dL — ABNORMAL HIGH (ref 8–23)
CO2: 31 mmol/L (ref 22–32)
Calcium: 8.9 mg/dL (ref 8.9–10.3)
Chloride: 101 mmol/L (ref 98–111)
Creatinine, Ser: 1.74 mg/dL — ABNORMAL HIGH (ref 0.61–1.24)
GFR calc Af Amer: 45 mL/min — ABNORMAL LOW (ref >60)
GFR calc non Af Amer: 39 mL/min — ABNORMAL LOW (ref >60)
Glucose, Bld: 130 mg/dL — ABNORMAL HIGH (ref 70–99)
Potassium: 4 mmol/L (ref 3.5–5.1)
Sodium: 139 mmol/L (ref 135–145)

## 2018-05-13 LAB — CBG MONITORING, ED: Glucose-Capillary: 136 mg/dL — ABNORMAL HIGH (ref 70–99)

## 2018-05-13 MED ORDER — POVIDONE-IODINE 10 % EX SOLN
CUTANEOUS | Status: AC
  Filled 2018-05-13: qty 15

## 2018-05-13 MED ORDER — CLINDAMYCIN HCL 300 MG PO CAPS: 300.0000 mg | ORAL_CAPSULE | Freq: Three times a day (TID) | ORAL | 0 refills | Status: DC

## 2018-05-13 MED ORDER — CLINDAMYCIN HCL 150 MG PO CAPS
600.0000 mg | ORAL_CAPSULE | Freq: Once | ORAL | Status: AC
  Administered 2018-05-13: 600 mg via ORAL
  Filled 2018-05-13: qty 4

## 2018-05-13 NOTE — ED Triage Notes (Signed)
Pt with wound to left great toe and left second toe, pt states he was burning some limbs recently while bare foot, pt has diabetes.  cbg 100 earlier today per pt.

## 2018-05-13 NOTE — Discharge Instructions (Signed)
Clean wound daily with mild soap and warm water. Normal bathing is fine but not prolonged soaking (beach, pool, etc). Apply ""wet-to-dry" dressing to big toe and simple gauze to the other two. Dressing changes daily or if they get soiled. Wear closed toe shoes. I would like to you follow-up to have your wounds re-checked in 2-3 days. Return sooner if you develop fever, pain, increasing redness, etc.
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
## 2018-05-20 DIAGNOSIS — Z23 Encounter for immunization: Secondary | ICD-10-CM | POA: Diagnosis not present

## 2018-05-20 DIAGNOSIS — E6609 Other obesity due to excess calories: Secondary | ICD-10-CM | POA: Diagnosis not present

## 2018-05-20 DIAGNOSIS — Z6833 Body mass index (BMI) 33.0-33.9, adult: Secondary | ICD-10-CM | POA: Diagnosis not present

## 2018-05-20 DIAGNOSIS — L89892 Pressure ulcer of other site, stage 2: Secondary | ICD-10-CM | POA: Diagnosis not present

## 2018-05-26 ENCOUNTER — Ambulatory Visit (HOSPITAL_COMMUNITY): Payer: No Typology Code available for payment source | Attending: Physician Assistant | Admitting: Physical Therapy

## 2018-05-26 ENCOUNTER — Other Ambulatory Visit: Payer: Self-pay

## 2018-05-26 DIAGNOSIS — R262 Difficulty in walking, not elsewhere classified: Secondary | ICD-10-CM | POA: Insufficient documentation

## 2018-05-26 DIAGNOSIS — T25332D Burn of third degree of left toe(s) (nail), subsequent encounter: Secondary | ICD-10-CM | POA: Diagnosis present

## 2018-05-26 DIAGNOSIS — T25232D Burn of second degree of left toe(s) (nail), subsequent encounter: Secondary | ICD-10-CM | POA: Diagnosis present
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
## 2018-05-27 ENCOUNTER — Telehealth (HOSPITAL_COMMUNITY): Payer: Self-pay

## 2018-05-27 NOTE — Telephone Encounter (Signed)
05/27/18  Wife called to change the 9/11 appt because she has a 10:45 appt and wanted something later.  I told her I would have to add him to the wait list because at the time there is nothing available to move him to and she said that was ok

## 2018-05-28 ENCOUNTER — Ambulatory Visit (HOSPITAL_COMMUNITY): Payer: No Typology Code available for payment source | Admitting: Physical Therapy

## 2018-05-28 DIAGNOSIS — T25332D Burn of third degree of left toe(s) (nail), subsequent encounter: Secondary | ICD-10-CM | POA: Diagnosis not present

## 2018-05-28 DIAGNOSIS — T25232D Burn of second degree of left toe(s) (nail), subsequent encounter: Secondary | ICD-10-CM

## 2018-05-28 DIAGNOSIS — R262 Difficulty in walking, not elsewhere classified: Secondary | ICD-10-CM

## 2018-05-28 NOTE — Therapy (Deleted)
[place], [name], [address] Phone: [phone]   Fax:  [phone]  Physical Therapy Treatment  Patient Details  Name: Benjamin [name] MRN: [id number] Date of Birth: [birth year] Referring Provider: [name]   Encounter Date: 05/28/2018  PT End of Session - 05/28/18 1212    Visit Number  2    Number of Visits  13    Date for PT Re-Evaluation  06/25/18    PT Start Time  0952    PT Stop Time  1030    PT Time Calculation (min)  38 min    Activity Tolerance  Patient tolerated treatment well    Behavior During Therapy  [facility] for tasks assessed/performed       Past Medical History:  Diagnosis Date  . Arthritis   . Essential hypertension   . Gout   . History of stroke    Right occipital May 2018  . Hypertensive heart disease    LVH with grade 2 diastolic dysfunction  . Mixed hyperlipidemia    Statin intolerance  . Rotator cuff tear    Chronic pain  . Sleep apnea    a. intolerant to CPAP  . Tubular adenoma   . Type 2 diabetes mellitus ([place])     Past Surgical History:  Procedure Laterality Date  . APPENDECTOMY    . COLONOSCOPY N/A 10/29/2013   Dr. [name]- sigmoid polyp status post cold snare removal. large sessile ascending colon polyp. debulked with saline assisted snare polypectomy . pancolonic diverticulosis. inadequate prep. tubular adenoma on bx  . COLONOSCOPY N/A 02/22/2014   Dr. [name]: Saline-assisted snare polypectomy/biopsy for sprawling carpet polyp in the ascending colon which could not be completely removed. Status post biopsy (tubular adenoma), tattooed  . COLONOSCOPY N/A 03/01/2014   IRC:VELF polypectomy hemorrhage s/p bleeding  . COLONOSCOPY N/A 06/08/2015   Procedure: COLONOSCOPY;  Surgeon: [name], MD;  [facility];  Service: Endoscopy;  Laterality: N/A;  0900  . ESOPHAGOGASTRODUODENOSCOPY N/A 02/22/2014   Dr. [name]: Erosive reflux esophagitis. Schatzki ring status post  dilation. Gastric and duodenal erosions with benign biopsies.  [name] LAPAROSCOPIC RIGHT COLECTOMY N/A 04/16/2014   Procedure: LAPAROSCOPIC ASSISTED RIGHT COLECTOMY;  Surgeon: [name], MD;  Location: [place];  Service: General;  Laterality: N/A;  . [name] DILATION N/A 02/22/2014   Procedure: [name];  Surgeon: [name], MD;  [facility];  Service: Endoscopy;  Laterality: N/A;  . SAVORY DILATION N/A 02/22/2014   Procedure: SAVORY DILATION;  Surgeon: [name], MD;  [facility];  Service: Endoscopy;  Laterality: N/A;    There were no vitals filed for this visit.                  Wound Therapy - 05/28/18 1207    Subjective  Pt states that he is getting a little more feeling back in his toes.      Patient and Family Stated Goals  burns to heal.     Date of Onset  05/12/18    Prior Treatments  ER, Self care, MD     Pain Scale  0-10    Pain Score  0-No pain    Evaluation and Treatment Procedures Explained to Patient/Family  Yes    Evaluation and Treatment Procedures  agreed to    Wound Properties Date First Assessed: 05/26/18 Time First Assessed: 0820 Wound Type: Burn Location: Toe (Comment  which one)  Location Orientation: Left Wound Description (Comments): 1st   Dressing Type  Gauze (Comment)    Dressing Changed  Changed    Dressing Status  Old drainage    Dressing Change Frequency  PRN    Site / Wound Assessment  Dry;Black    % Wound base Red or Granulating  0%    % Wound base Yellow/Fibrinous Exudate  10%    % Wound base Black/Eschar  90%    Peri-wound Assessment  Intact    Drainage Amount  None    Treatment  Cleansed;Debridement (Selective)    Wound Properties Date First Assessed: 05/26/18 Time First Assessed: 0830 Wound Type: Burn Location: Toe (Comment  which one) Location Orientation: Left Wound Description (Comments): 2nd Present on Admission: Yes   Dressing Type  Gauze (Comment);Impregnated gauze (bismuth)    Dressing  Changed  Changed    Dressing Status  Old drainage    Dressing Change Frequency  PRN    Site / Wound Assessment  Dusky;Red    % Wound base Red or Granulating  95%    % Wound base Yellow/Fibrinous Exudate  5%    Peri-wound Assessment  Intact    Drainage Amount  Minimal    Drainage Description  Serous    Treatment  Cleansed;Debridement (Selective)    Wound Properties Date First Assessed: 05/26/18 Time First Assessed: 0840 Wound Type: Burn Location: Toe (Comment  which one) Location Orientation: Left Wound Description (Comments): 3rd Present on Admission: Yes   Dressing Type  Gauze (Comment)    Dressing Status  Old drainage    Dressing Change Frequency  PRN    Site / Wound Assessment  Dusky;Yellow    % Wound base Red or Granulating  90%    % Wound base Yellow/Fibrinous Exudate  10%    Peri-wound Assessment  Intact    Drainage Amount  Minimal    Drainage Description  Serosanguineous    Treatment  Cleansed;Debridement (Selective)    Selective Debridement - Location  First three toes of Lt foot.     Selective Debridement - Tools Used  Forceps;Scalpel;Scissors    Selective Debridement - Tissue Removed  devitalized tissue, pus, callous    Wound Therapy - Clinical Statement  Second and thrid toe have significant improved granulation.  Black Eschar on Big toe remains very dry and adherent.  Pt will continue to benefit from skilled physical therapy to remove devitalized tissue to ensure a healing environment is maintained.     Wound Therapy - Functional Problem List  diffficulty donning and doffing shoes.     Factors Delaying/Impairing Wound Healing  Altered sensation;Diabetes Mellitus;Polypharmacy;Vascular compromise    Hydrotherapy Plan  Debridement;Dressing change;Patient/family education    Wound Therapy - Frequency  3X / week   Three times a week for one week then 2x a week for 3 weeks.    Wound Therapy - Current Recommendations  PT    Wound Plan  PT to be educated on prevention of wounds  with DM, educated on self care, wounds to be debrided and dressed to maintain a healing enviornment.     Dressing   1st toe silvadene 2x2 and gauze.    Dressing  2nd and 3rd toe xeroform, gauze netting to hold dressing in place.                  PT Short Term Goals - 05/28/18 1213      PT SHORT TERM GOAL #1   Title  Pt to verbalize  the importance of wearing shoes at all time.    Time  1    Period  Weeks    Status  Achieved      PT SHORT TERM GOAL #2   Title  PT Lt  second and third toes to be healed     Time  2    Period  Weeks    Status  On-going        PT Long Term Goals - 05/28/18 1213      PT LONG TERM GOAL #1   Title  PT Lt great toe to have no eschar to prevent infection.     Time  4    Period  Weeks    Status  On-going      PT LONG TERM GOAL #2   Title  PT LT great toe burn to have approximated to no greater than 2 x 2 to allow pt and wife to be confident in self caring until wound heals.     Time  4    Period  Weeks    Status  On-going            Plan - 05/28/18 1212    Clinical Impression Statement  see above     Rehab Potential  Good    PT Frequency  3x / week    PT Duration  4 weeks    PT Treatment/Interventions  ADLs/Self Care Home Management   sharp debridement and dressing change.    PT Next Visit Plan  Continue with sharp debridement and dressing change to promote a healthy environment for wound healing.     Consulted and Agree with Plan of Care  Patient;Family member/caregiver       Patient will benefit from skilled therapeutic intervention in order to improve the following deficits and impairments:  Decreased balance, Decreased skin integrity  Visit Diagnosis: Deep full thickness burn of toe of left foot, subsequent encounter  Partial thickness burn of left second toe, subsequent encounter  Partial thickness burn of left third toe, subsequent encounter  Difficulty in walking, not elsewhere classified     Problem  List Patient Active Problem List   Diagnosis Date Noted  . CVA (cerebral vascular accident) ([place]) 01/21/2017  . CKD (chronic kidney disease) 01/21/2017  . History of colonic polyps   . Diverticulosis of colon without hemorrhage   . Adenomatous colon polyp 04/16/2014  . Preoperative cardiovascular examination 03/24/2014  . Hypertensive heart disease 03/24/2014  . Personal history of colonic polyps 03/04/2014  . Rectal bleeding 03/01/2014  . Diverticulosis of colon 03/01/2014  . Gout 12/26/2012  . Obesity 12/20/2012  . Essential hypertension, benign 10/22/2011  . Hyperlipidemia 10/22/2011  . DM (diabetes mellitus) ([place]) 10/22/2011    Lanyiah Brix,CINDY 05/28/2018, 12:14 PM  [facility] [address] [place], [name], [address] Phone: [phone]   Fax:  [phone]  Name: Benjamin [name] MRN: [id number] Date of Birth: [birth year]

## 2018-05-28 NOTE — Therapy (Signed)
[place], [name], [address] Phone: [phone]   Fax:  [phone]  Wound Care Therapy  Patient Details  Name: Benjamin [name] MRN: [id number] Date of Birth: 1951/06/26 Referring Provider: [name]   Encounter Date: 05/28/2018  PT End of Session - 05/28/18 1212    Visit Number  2    Number of Visits  13    Date for PT Re-Evaluation  06/25/18    PT Start Time  0952    PT Stop Time  1030    PT Time Calculation (min)  38 min    Activity Tolerance  Patient tolerated treatment well    Behavior During Therapy  [facility] for tasks assessed/performed       Past Medical History:  Diagnosis Date  . Arthritis   . Essential hypertension   . Gout   . History of stroke    Right occipital May 2018  . Hypertensive heart disease    LVH with grade 2 diastolic dysfunction  . Mixed hyperlipidemia    Statin intolerance  . Rotator cuff tear    Chronic pain  . Sleep apnea    a. intolerant to CPAP  . Tubular adenoma   . Type 2 diabetes mellitus ([place])     Past Surgical History:  Procedure Laterality Date  . APPENDECTOMY    . COLONOSCOPY N/A 10/29/2013   Dr. [name]- sigmoid polyp status post cold snare removal. large sessile ascending colon polyp. debulked with saline assisted snare polypectomy . pancolonic diverticulosis. inadequate prep. tubular adenoma on bx  . COLONOSCOPY N/A 02/22/2014   Dr. [name]: Saline-assisted snare polypectomy/biopsy for sprawling carpet polyp in the ascending colon which could not be completely removed. Status post biopsy (tubular adenoma), tattooed  . COLONOSCOPY N/A 03/01/2014   HCW:CBJS polypectomy hemorrhage s/p bleeding  . COLONOSCOPY N/A 06/08/2015   Procedure: COLONOSCOPY;  Surgeon: [name], MD;  [facility];  Service: Endoscopy;  Laterality: N/A;  0900  . ESOPHAGOGASTRODUODENOSCOPY N/A 02/22/2014   Dr. [name]: Erosive reflux esophagitis. Schatzki ring status post dilation.  Gastric and duodenal erosions with benign biopsies.  [name] LAPAROSCOPIC RIGHT COLECTOMY N/A 04/16/2014   Procedure: LAPAROSCOPIC ASSISTED RIGHT COLECTOMY;  Surgeon: [name], MD;  Location: [place];  Service: General;  Laterality: N/A;  . [name] DILATION N/A 02/22/2014   Procedure: [name];  Surgeon: [name], MD;  [facility];  Service: Endoscopy;  Laterality: N/A;  . SAVORY DILATION N/A 02/22/2014   Procedure: SAVORY DILATION;  Surgeon: [name], MD;  [facility];  Service: Endoscopy;  Laterality: N/A;    There were no vitals filed for this visit.              Wound Therapy - 05/28/18 1207    Subjective  Pt states that he is getting a little more feeling back in his toes.      Patient and Family Stated Goals  burns to heal.     Date of Onset  05/12/18    Prior Treatments  ER, Self care, MD     Pain Scale  0-10    Pain Score  0-No pain    Evaluation and Treatment Procedures Explained to Patient/Family  Yes    Evaluation and Treatment Procedures  agreed to    Wound Properties Date First Assessed: 05/26/18 Time First Assessed: 0820 Wound Type: Burn Location: Toe (Comment  which one) Location Orientation: Left Wound  Description (Comments): 1st   Dressing Type  Gauze (Comment)    Dressing Changed  Changed    Dressing Status  Old drainage    Dressing Change Frequency  PRN    Site / Wound Assessment  Dry;Black    % Wound base Red or Granulating  0%    % Wound base Yellow/Fibrinous Exudate  10%    % Wound base Black/Eschar  90%    Peri-wound Assessment  Intact    Drainage Amount  None    Treatment  Cleansed;Debridement (Selective)    Wound Properties Date First Assessed: 05/26/18 Time First Assessed: 0830 Wound Type: Burn Location: Toe (Comment  which one) Location Orientation: Left Wound Description (Comments): 2nd Present on Admission: Yes   Dressing Type  Gauze (Comment);Impregnated gauze (bismuth)    Dressing Changed  Changed     Dressing Status  Old drainage    Dressing Change Frequency  PRN    Site / Wound Assessment  Dusky;Red    % Wound base Red or Granulating  95%    % Wound base Yellow/Fibrinous Exudate  5%    Peri-wound Assessment  Intact    Drainage Amount  Minimal    Drainage Description  Serous    Treatment  Cleansed;Debridement (Selective)    Wound Properties Date First Assessed: 05/26/18 Time First Assessed: 0840 Wound Type: Burn Location: Toe (Comment  which one) Location Orientation: Left Wound Description (Comments): 3rd Present on Admission: Yes   Dressing Type  Gauze (Comment)    Dressing Status  Old drainage    Dressing Change Frequency  PRN    Site / Wound Assessment  Dusky;Yellow    % Wound base Red or Granulating  90%    % Wound base Yellow/Fibrinous Exudate  10%    Peri-wound Assessment  Intact    Drainage Amount  Minimal    Drainage Description  Serosanguineous    Treatment  Cleansed;Debridement (Selective)    Selective Debridement - Location  First three toes of Lt foot.     Selective Debridement - Tools Used  Forceps;Scalpel;Scissors    Selective Debridement - Tissue Removed  devitalized tissue, pus, callous    Wound Therapy - Clinical Statement  Second and thrid toe have significant improved granulation.  Black Eschar on Big toe remains very dry and adherent.  Pt will continue to benefit from skilled physical therapy to remove devitalized tissue to ensure a healing environment is maintained.     Wound Therapy - Functional Problem List  diffficulty donning and doffing shoes.     Factors Delaying/Impairing Wound Healing  Altered sensation;Diabetes Mellitus;Polypharmacy;Vascular compromise    Hydrotherapy Plan  Debridement;Dressing change;Patient/family education    Wound Therapy - Frequency  3X / week   Three times a week for one week then 2x a week for 3 weeks.    Wound Therapy - Current Recommendations  PT    Wound Plan  PT to be educated on prevention of wounds with DM, educated on  self care, wounds to be debrided and dressed to maintain a healing enviornment.     Dressing   1st toe silvadene 2x2 and gauze.    Dressing  2nd and 3rd toe xeroform, gauze netting to hold dressing in place.                 PT Short Term Goals - 05/28/18 1213      PT SHORT TERM GOAL #1   Title  Pt to verbalize the importance of wearing shoes  at all time.    Time  1    Period  Weeks    Status  Achieved      PT SHORT TERM GOAL #2   Title  PT Lt  second and third toes to be healed     Time  2    Period  Weeks    Status  On-going        PT Long Term Goals - 05/28/18 1213      PT LONG TERM GOAL #1   Title  PT Lt great toe to have no eschar to prevent infection.     Time  4    Period  Weeks    Status  On-going      PT LONG TERM GOAL #2   Title  PT LT great toe burn to have approximated to no greater than 2 x 2 to allow pt and wife to be confident in self caring until wound heals.     Time  4    Period  Weeks    Status  On-going            Plan - 05/28/18 1212    Clinical Impression Statement  see above     Rehab Potential  Good    PT Frequency  3x / week    PT Duration  4 weeks    PT Treatment/Interventions  ADLs/Self Care Home Management   sharp debridement and dressing change.    PT Next Visit Plan  Continue with sharp debridement and dressing change to promote a healthy environment for wound healing.     Consulted and Agree with Plan of Care  Patient;Family member/caregiver       Patient will benefit from skilled therapeutic intervention in order to improve the following deficits and impairments:  Decreased balance, Decreased skin integrity  Visit Diagnosis: Deep full thickness burn of toe of left foot, subsequent encounter  Partial thickness burn of left second toe, subsequent encounter  Partial thickness burn of left third toe, subsequent encounter  Difficulty in walking, not elsewhere classified     Problem List Patient Active Problem  List   Diagnosis Date Noted  . CVA (cerebral vascular accident) ([place]) 01/21/2017  . CKD (chronic kidney disease) 01/21/2017  . History of colonic polyps   . Diverticulosis of colon without hemorrhage   . Adenomatous colon polyp 04/16/2014  . Preoperative cardiovascular examination 03/24/2014  . Hypertensive heart disease 03/24/2014  . Personal history of colonic polyps 03/04/2014  . Rectal bleeding 03/01/2014  . Diverticulosis of colon 03/01/2014  . Gout 12/26/2012  . Obesity 12/20/2012  . Essential hypertension, benign 10/22/2011  . Hyperlipidemia 10/22/2011  . DM (diabetes mellitus) ([place]) 10/22/2011   Rayetta Humphrey, PT CLT [phone] 05/28/2018, 12:16 PM  [facility] [address] [place], [name], [address] Phone: [phone]   Fax:  [phone]  Name: Benjamin [name] MRN: [id number] Date of Birth: 17-Apr-1951

## 2018-05-30 ENCOUNTER — Encounter

## 2018-05-30 ENCOUNTER — Ambulatory Visit (HOSPITAL_COMMUNITY): Payer: No Typology Code available for payment source | Admitting: Physical Therapy

## 2018-05-30 DIAGNOSIS — T25332D Burn of third degree of left toe(s) (nail), subsequent encounter: Secondary | ICD-10-CM

## 2018-05-30 DIAGNOSIS — T25232D Burn of second degree of left toe(s) (nail), subsequent encounter: Secondary | ICD-10-CM

## 2018-05-30 DIAGNOSIS — R262 Difficulty in walking, not elsewhere classified: Secondary | ICD-10-CM

## 2018-05-30 NOTE — Therapy (Signed)
[place], [name], [address] Phone: [phone]   Fax:  [phone]  Wound Care Therapy  Patient Details  Name: Benjamin [name] MRN: [id number] Date of Birth: 08-27-1951 Referring Provider: [name]   Encounter Date: 05/30/2018  PT End of Session - 05/30/18 0936    Visit Number  3    Number of Visits  13    Date for PT Re-Evaluation  06/25/18    PT Start Time  0815    PT Stop Time  0900    PT Time Calculation (min)  45 min    Activity Tolerance  Patient tolerated treatment well    Behavior During Therapy  [facility] for tasks assessed/performed       Past Medical History:  Diagnosis Date  . Arthritis   . Essential hypertension   . Gout   . History of stroke    Right occipital May 2018  . Hypertensive heart disease    LVH with grade 2 diastolic dysfunction  . Mixed hyperlipidemia    Statin intolerance  . Rotator cuff tear    Chronic pain  . Sleep apnea    a. intolerant to CPAP  . Tubular adenoma   . Type 2 diabetes mellitus ([place])     Past Surgical History:  Procedure Laterality Date  . APPENDECTOMY    . COLONOSCOPY N/A 10/29/2013   Dr. [name]- sigmoid polyp status post cold snare removal. large sessile ascending colon polyp. debulked with saline assisted snare polypectomy . pancolonic diverticulosis. inadequate prep. tubular adenoma on bx  . COLONOSCOPY N/A 02/22/2014   Dr. [name]: Saline-assisted snare polypectomy/biopsy for sprawling carpet polyp in the ascending colon which could not be completely removed. Status post biopsy (tubular adenoma), tattooed  . COLONOSCOPY N/A 03/01/2014   CZY:SAYT polypectomy hemorrhage s/p bleeding  . COLONOSCOPY N/A 06/08/2015   Procedure: COLONOSCOPY;  Surgeon: [name], MD;  [facility];  Service: Endoscopy;  Laterality: N/A;  0900  . ESOPHAGOGASTRODUODENOSCOPY N/A 02/22/2014   Dr. [name]: Erosive reflux esophagitis. Schatzki ring status post dilation.  Gastric and duodenal erosions with benign biopsies.  [name] LAPAROSCOPIC RIGHT COLECTOMY N/A 04/16/2014   Procedure: LAPAROSCOPIC ASSISTED RIGHT COLECTOMY;  Surgeon: [name], MD;  Location: [place];  Service: General;  Laterality: N/A;  . [name] DILATION N/A 02/22/2014   Procedure: [name];  Surgeon: [name], MD;  [facility];  Service: Endoscopy;  Laterality: N/A;  . SAVORY DILATION N/A 02/22/2014   Procedure: SAVORY DILATION;  Surgeon: [name], MD;  [facility];  Service: Endoscopy;  Laterality: N/A;    There were no vitals filed for this visit.              Wound Therapy - 05/30/18 0929    Subjective  Pt states that he is wearing shoes at all times.  States that  he does not have pain except when therapist is debriding.     Patient and Family Stated Goals  burns to heal.     Date of Onset  05/12/18    Prior Treatments  ER, Self care, MD     Pain Scale  0-10    Pain Score  0-No pain    Evaluation and Treatment Procedures Explained to Patient/Family  Yes    Evaluation and Treatment Procedures  agreed to    Wound Properties Date First Assessed: 05/26/18 Time First Assessed: 0820 Wound Type: Burn Location:  Toe (Comment  which one) Location Orientation: Left Wound Description (Comments): 1st   Dressing Type  Gauze (Comment)    Dressing Changed  Changed    Dressing Status  Old drainage    Dressing Change Frequency  PRN    Site / Wound Assessment  Dry;Black    % Wound base Red or Granulating  0%    % Wound base Yellow/Fibrinous Exudate  50%    % Wound base Black/Eschar  50%    Peri-wound Assessment  Intact    Drainage Amount  Scant    Treatment  Cleansed;Debridement (Selective)    Wound Properties Date First Assessed: 05/26/18 Time First Assessed: 0830 Wound Type: Burn Location: Toe (Comment  which one) Location Orientation: Left Wound Description (Comments): 2nd Present on Admission: Yes   Dressing Type  Gauze  (Comment);Impregnated gauze (bismuth)    Dressing Changed  Changed    Dressing Status  Old drainage    Dressing Change Frequency  PRN    Site / Wound Assessment  Pale;Red    % Wound base Red or Granulating  95%    % Wound base Yellow/Fibrinous Exudate  5%    Peri-wound Assessment  Intact    Drainage Amount  Minimal    Drainage Description  Serous    Treatment  Cleansed;Debridement (Selective)    Wound Properties Date First Assessed: 05/26/18 Time First Assessed: 0840 Wound Type: Burn Location: Toe (Comment  which one) Location Orientation: Left Wound Description (Comments): 3rd Present on Admission: Yes   Dressing Type  Gauze (Comment)    Dressing Changed  Changed    Dressing Status  Old drainage    Dressing Change Frequency  PRN    Site / Wound Assessment  Pale;Yellow    % Wound base Red or Granulating  90%    % Wound base Yellow/Fibrinous Exudate  10%    Peri-wound Assessment  Intact    Drainage Amount  Minimal    Drainage Description  Serosanguineous    Treatment  Cleansed;Debridement (Selective)    Selective Debridement - Location  Great toe eschar is no longer adherent; therapist was able to remove 50% with yellow slough underneath.  Second and thirdt toes burns with slough initially that is readily removed.     Selective Debridement - Tools Used  Forceps;Scalpel;Scissors    Selective Debridement - Tissue Removed  devitalized tissue, pus, callous    Wound Therapy - Clinical Statement  Big toe eschar no longer adherent.  Able to debride 50% of eschar off with yellow slough noted underneath.  Therapist explained to pt and wife that they can expect increased drainage from the great toe and if they notice drainage coming through the bandages they should remove, cleanse and redress.  Pt vocalized understanding.     Wound Therapy - Functional Problem List  diffficulty donning and doffing shoes.     Factors Delaying/Impairing Wound Healing  Altered sensation;Diabetes  Mellitus;Polypharmacy;Vascular compromise    Hydrotherapy Plan  Debridement;Dressing change;Patient/family education    Wound Therapy - Frequency  3X / week   Three times a week for one week then 2x a week for 3 weeks.    Wound Therapy - Current Recommendations  PT    Wound Plan  PT to be educated on prevention of wounds with DM, educated on self care, wounds to be debrided and dressed to maintain a healing enviornment.     Dressing   1st toe silvadene 2x2 and gauze.    Dressing  2nd and  3rd toe xeroform, gauze netting to hold dressing in place.                 PT Short Term Goals - 05/28/18 1213      PT SHORT TERM GOAL #1   Title  Pt to verbalize the importance of wearing shoes at all time.    Time  1    Period  Weeks    Status  Achieved      PT SHORT TERM GOAL #2   Title  PT Lt  second and third toes to be healed     Time  2    Period  Weeks    Status  On-going        PT Long Term Goals - 05/28/18 1213      PT LONG TERM GOAL #1   Title  PT Lt great toe to have no eschar to prevent infection.     Time  4    Period  Weeks    Status  On-going      PT LONG TERM GOAL #2   Title  PT LT great toe burn to have approximated to no greater than 2 x 2 to allow pt and wife to be confident in self caring until wound heals.     Time  4    Period  Weeks    Status  On-going            Plan - 05/30/18 0936    Clinical Impression Statement  see above     Rehab Potential  Good    PT Frequency  3x / week    PT Duration  4 weeks    PT Treatment/Interventions  ADLs/Self Care Home Management   sharp debridement and dressing change.    PT Next Visit Plan  Continue with sharp debridement and dressing change to promote a healthy environment for wound healing.     Consulted and Agree with Plan of Care  Patient;Family member/caregiver       Patient will benefit from skilled therapeutic intervention in order to improve the following deficits and impairments:  Decreased  balance, Decreased skin integrity  Visit Diagnosis: Deep full thickness burn of toe of left foot, subsequent encounter  Partial thickness burn of left second toe, subsequent encounter  Partial thickness burn of left third toe, subsequent encounter  Difficulty in walking, not elsewhere classified     Problem List Patient Active Problem List   Diagnosis Date Noted  . CVA (cerebral vascular accident) ([place]) 01/21/2017  . CKD (chronic kidney disease) 01/21/2017  . History of colonic polyps   . Diverticulosis of colon without hemorrhage   . Adenomatous colon polyp 04/16/2014  . Preoperative cardiovascular examination 03/24/2014  . Hypertensive heart disease 03/24/2014  . Personal history of colonic polyps 03/04/2014  . Rectal bleeding 03/01/2014  . Diverticulosis of colon 03/01/2014  . Gout 12/26/2012  . Obesity 12/20/2012  . Essential hypertension, benign 10/22/2011  . Hyperlipidemia 10/22/2011  . DM (diabetes mellitus) ([place]) 10/22/2011    Rayetta Humphrey, PT CLT [phone] 05/30/2018, 9:37 AM  [place] [address], [name], [address] Phone: [phone]   Fax:  [phone]  Name: Benjamin [name] MRN: [id number] Date of Birth: [birth year]

## 2018-06-03 ENCOUNTER — Encounter (HOSPITAL_COMMUNITY): Payer: Self-pay

## 2018-06-03 ENCOUNTER — Other Ambulatory Visit: Payer: Self-pay

## 2018-06-03 ENCOUNTER — Ambulatory Visit (HOSPITAL_COMMUNITY): Payer: No Typology Code available for payment source

## 2018-06-03 DIAGNOSIS — T25332D Burn of third degree of left toe(s) (nail), subsequent encounter: Secondary | ICD-10-CM | POA: Diagnosis not present

## 2018-06-03 DIAGNOSIS — R262 Difficulty in walking, not elsewhere classified: Secondary | ICD-10-CM

## 2018-06-03 DIAGNOSIS — T25232D Burn of second degree of left toe(s) (nail), subsequent encounter: Secondary | ICD-10-CM

## 2018-06-03 NOTE — Therapy (Signed)
[place], [name], [address] Phone: [phone]   Fax:  [phone]  Wound Care Therapy  Patient Details  Name: Benjamin [name] MRN: [id number] Date of Birth: September 21, 1950 Referring Provider: [name]   Encounter Date: 06/03/2018  PT End of Session - 06/03/18 1624    Visit Number  4    Number of Visits  13    Date for PT Re-Evaluation  06/25/18    Authorization Type  [employer] (no visit limit, no auth) patient is working on Systems analyst - Visit Number  4    Authorization - Number of Visits  10    PT Start Time  1519    PT Stop Time  1605    PT Time Calculation (min)  46 min    Activity Tolerance  Patient tolerated treatment well    Behavior During Therapy  [facility] for tasks assessed/performed       Past Medical History:  Diagnosis Date  . Arthritis   . Essential hypertension   . Gout   . History of stroke    Right occipital May 2018  . Hypertensive heart disease    LVH with grade 2 diastolic dysfunction  . Mixed hyperlipidemia    Statin intolerance  . Rotator cuff tear    Chronic pain  . Sleep apnea    a. intolerant to CPAP  . Tubular adenoma   . Type 2 diabetes mellitus ([place])     Past Surgical History:  Procedure Laterality Date  . APPENDECTOMY    . COLONOSCOPY N/A 10/29/2013   Dr. [name]- sigmoid polyp status post cold snare removal. large sessile ascending colon polyp. debulked with saline assisted snare polypectomy . pancolonic diverticulosis. inadequate prep. tubular adenoma on bx  . COLONOSCOPY N/A 02/22/2014   Dr. [name]: Saline-assisted snare polypectomy/biopsy for sprawling carpet polyp in the ascending colon which could not be completely removed. Status post biopsy (tubular adenoma), tattooed  . COLONOSCOPY N/A 03/01/2014   FUX:NATF polypectomy hemorrhage s/p bleeding  . COLONOSCOPY N/A 06/08/2015   Procedure: COLONOSCOPY;  Surgeon: [name], MD;  [facility];  Service: Endoscopy;  Laterality: N/A;  0900  . ESOPHAGOGASTRODUODENOSCOPY N/A 02/22/2014   Dr. [name]: Erosive reflux esophagitis. Schatzki ring status post dilation. Gastric and duodenal erosions with benign biopsies.  [name] LAPAROSCOPIC RIGHT COLECTOMY N/A 04/16/2014   Procedure: LAPAROSCOPIC ASSISTED RIGHT COLECTOMY;  Surgeon: [name], MD;  Location: [place];  Service: General;  Laterality: N/A;  . [name] DILATION N/A 02/22/2014   Procedure: [name];  Surgeon: [name], MD;  [facility];  Service: Endoscopy;  Laterality: N/A;  . SAVORY DILATION N/A 02/22/2014   Procedure: SAVORY DILATION;  Surgeon: [name], MD;  [facility];  Service: Endoscopy;  Laterality: N/A;    There were no vitals filed for this visit.     Wound Therapy - 06/03/18 1610    Subjective  Patient arrives with sife, with dressing dry and intact. He denies pain at start and reports he jsut started to get some sensation in his 2nd and 3rd digit last treatment session.    Patient and Family Stated Goals  burns to heal.     Date of Onset  05/12/18    Prior Treatments  ER, Self care, MD     Pain Scale  0-10    Pain Score  0-No pain  Evaluation and Treatment Procedures Explained to Patient/Family  Yes    Evaluation and Treatment Procedures  agreed to    Wound Properties Date First Assessed: 05/26/18 Time First Assessed: 0820 Wound Type: Burn Location: Toe (Comment  which one) Location Orientation: Left Wound Description (Comments): 1st   Dressing Type  Gauze (Comment)    Dressing Changed  Changed    Dressing Status  Old drainage    Dressing Change Frequency  PRN    Site / Wound Assessment  Dry;Yellow    % Wound base Red or Granulating  0%    % Wound base Yellow/Fibrinous Exudate  95%    % Wound base Black/Eschar  0%    % Wound base Other/Granulation Tissue (Comment)  5%    Peri-wound Assessment  Intact   pale tissue   Wound Length (cm)  2.7 cm   3.4    Wound Width (cm)  2.1 cm   3   Wound Depth (cm)  0.2 cm    Wound Volume (cm^3)  1.13 cm^3    Wound Surface Area (cm^2)  5.67 cm^2    Drainage Amount  Scant    Treatment  Cleansed;Debridement (Selective)    Wound Properties Date First Assessed: 05/26/18 Time First Assessed: 0830 Wound Type: Burn Location: Toe (Comment  which one) Location Orientation: Left Wound Description (Comments): 2nd Present on Admission: Yes   Dressing Type  Gauze (Comment);Impregnated gauze (bismuth)    Dressing Changed  Changed    Dressing Status  Old drainage    Dressing Change Frequency  PRN    Site / Wound Assessment  Pale;Red    % Wound base Red or Granulating  95%    % Wound base Yellow/Fibrinous Exudate  5%    Peri-wound Assessment  Intact    Wound Length (cm)  1.2 cm   1.2   Wound Width (cm)  0.8 cm   1.0   Wound Surface Area (cm^2)  0.96 cm^2    Drainage Amount  Minimal    Drainage Description  Serous    Treatment  Cleansed;Debridement (Selective)    Wound Properties Date First Assessed: 05/26/18 Time First Assessed: 0840 Wound Type: Burn Location: Toe (Comment  which one) Location Orientation: Left Wound Description (Comments): 3rd Present on Admission: Yes   Dressing Type  Gauze (Comment)    Dressing Changed  Changed    Dressing Status  Old drainage    Dressing Change Frequency  PRN    Site / Wound Assessment  Pale;Yellow    % Wound base Red or Granulating  95%    % Wound base Yellow/Fibrinous Exudate  5%    Peri-wound Assessment  Intact    Wound Length (cm)  0.7 cm   1.4   Wound Width (cm)  0.5 cm   1.0   Wound Surface Area (cm^2)  0.35 cm^2    Drainage Amount  Minimal    Drainage Description  Serosanguineous    Treatment  Cleansed;Debridement (Selective)    Selective Debridement - Location  Great toe: debrided 100% of adherent eschar during session with yellow fibrous slough underneath remaining. 2nd toe: adherent dry slough "scab" over wound and around edges. 3rd toe: slough from wound  bed and edges debrided easily.    Selective Debridement - Tools Used  Forceps;Scalpel;Scissors    Selective Debridement - Tissue Removed  devitalized tissue, slough, callous    Wound Therapy - Clinical Statement  Successful debridement of 100% of eschar from great toe, now with adherent  yellow slough remaining. Continued with silvadene and gauze on great toe for dressing and xeroform on 2nd/3rd toes. Continued with wrapping to keep dressings intact. Patient and his wife verbalized there understanding to keep dressing dry and remove and re-wrap if it becomes wet or drainage comes through. Patient will continue to benefit from skilled wound care to remove necrotic/devitalized tissue and promote healing environment.    Wound Therapy - Functional Problem List  diffficulty donning and doffing shoes.     Factors Delaying/Impairing Wound Healing  Altered sensation;Diabetes Mellitus;Polypharmacy;Vascular compromise    Hydrotherapy Plan  Debridement;Dressing change;Patient/family education    Wound Therapy - Frequency  3X / week   Three times a week for one week then 2x a week for 3 weeks.    Wound Therapy - Current Recommendations  PT    Wound Plan  PT to be educated on prevention of wounds with DM, educated on self care, wounds to be debrided and dressed to maintain a healing enviornment.     Dressing   1st toe silvadene 2x2 and gauze.    Dressing  2nd and 3rd toe xeroform, gauze netting to hold dressing in place.         PT Short Term Goals - 05/28/18 1213      PT SHORT TERM GOAL #1   Title  Pt to verbalize the importance of wearing shoes at all time.    Time  1    Period  Weeks    Status  Achieved      PT SHORT TERM GOAL #2   Title  PT Lt  second and third toes to be healed     Time  2    Period  Weeks    Status  On-going        PT Long Term Goals - 05/28/18 1213      PT LONG TERM GOAL #1   Title  PT Lt great toe to have no eschar to prevent infection.     Time  4    Period  Weeks     Status  On-going      PT LONG TERM GOAL #2   Title  PT LT great toe burn to have approximated to no greater than 2 x 2 to allow pt and wife to be confident in self caring until wound heals.     Time  4    Period  Weeks    Status  On-going        Plan - 06/03/18 1626    Clinical Impression Statement  see above    Rehab Potential  Good    PT Frequency  3x / week    PT Duration  4 weeks    PT Treatment/Interventions  ADLs/Self Care Home Management   sharp debridement and dressing change.    PT Next Visit Plan  Continue with sharp debridement and dressing change to promote a healthy environment for wound healing.     Consulted and Agree with Plan of Care  Patient;Family member/caregiver       Patient will benefit from skilled therapeutic intervention in order to improve the following deficits and impairments:  Decreased balance, Decreased skin integrity  Visit Diagnosis: Deep full thickness burn of toe of left foot, subsequent encounter  Partial thickness burn of left second toe, subsequent encounter  Partial thickness burn of left third toe, subsequent encounter  Difficulty in walking, not elsewhere classified     Problem List Patient Active Problem List  Diagnosis Date Noted  . CVA (cerebral vascular accident) ([place]) 01/21/2017  . CKD (chronic kidney disease) 01/21/2017  . History of colonic polyps   . Diverticulosis of colon without hemorrhage   . Adenomatous colon polyp 04/16/2014  . Preoperative cardiovascular examination 03/24/2014  . Hypertensive heart disease 03/24/2014  . Personal history of colonic polyps 03/04/2014  . Rectal bleeding 03/01/2014  . Diverticulosis of colon 03/01/2014  . Gout 12/26/2012  . Obesity 12/20/2012  . Essential hypertension, benign 10/22/2011  . Hyperlipidemia 10/22/2011  . DM (diabetes mellitus) ([place]) 10/22/2011    [name], PT, DPT [facility] Therap[facility]  06/03/2018 4:26  PM    [place], [name], [address] Phone: [phone]   Fax:  [phone]  Name: Benjamin [name] MRN: [id number] Date of Birth: 04-Jan-1951

## 2018-06-05 ENCOUNTER — Encounter (HOSPITAL_COMMUNITY): Payer: Self-pay | Admitting: Physical Therapy

## 2018-06-05 ENCOUNTER — Ambulatory Visit (HOSPITAL_COMMUNITY): Payer: No Typology Code available for payment source | Admitting: Physical Therapy

## 2018-06-05 DIAGNOSIS — T25232D Burn of second degree of left toe(s) (nail), subsequent encounter: Secondary | ICD-10-CM

## 2018-06-05 DIAGNOSIS — R262 Difficulty in walking, not elsewhere classified: Secondary | ICD-10-CM

## 2018-06-05 DIAGNOSIS — T25332D Burn of third degree of left toe(s) (nail), subsequent encounter: Secondary | ICD-10-CM | POA: Diagnosis not present

## 2018-06-05 NOTE — Therapy (Signed)
[place], [name], [address] Phone: [phone]   Fax:  [phone]  Wound Care Therapy  Patient Details  Name: Benjamin [name] MRN: [id number] Date of Birth: 25-Mar-1951 Referring Provider: [name]   Encounter Date: 06/05/2018  PT End of Session - 06/05/18 1429    Visit Number  5    Number of Visits  13    Date for PT Re-Evaluation  06/25/18    Authorization Type  [employer] (no visit limit, no auth) patient is working on Systems analyst - Visit Number  5    Authorization - Number of Visits  10    PT Start Time  1338    PT Stop Time  1420    PT Time Calculation (min)  42 min    Activity Tolerance  Patient tolerated treatment well    Behavior During Therapy  [facility] for tasks assessed/performed       Past Medical History:  Diagnosis Date  . Arthritis   . Essential hypertension   . Gout   . History of stroke    Right occipital May 2018  . Hypertensive heart disease    LVH with grade 2 diastolic dysfunction  . Mixed hyperlipidemia    Statin intolerance  . Rotator cuff tear    Chronic pain  . Sleep apnea    a. intolerant to CPAP  . Tubular adenoma   . Type 2 diabetes mellitus ([place])     Past Surgical History:  Procedure Laterality Date  . APPENDECTOMY    . COLONOSCOPY N/A 10/29/2013   Dr. [name]- sigmoid polyp status post cold snare removal. large sessile ascending colon polyp. debulked with saline assisted snare polypectomy . pancolonic diverticulosis. inadequate prep. tubular adenoma on bx  . COLONOSCOPY N/A 02/22/2014   Dr. [name]: Saline-assisted snare polypectomy/biopsy for sprawling carpet polyp in the ascending colon which could not be completely removed. Status post biopsy (tubular adenoma), tattooed  . COLONOSCOPY N/A 03/01/2014   [id number] polypectomy hemorrhage s/p bleeding  . COLONOSCOPY N/A 06/08/2015   Procedure: COLONOSCOPY;  Surgeon: [name], MD;  [facility];  Service: Endoscopy;  Laterality: N/A;  0900  . ESOPHAGOGASTRODUODENOSCOPY N/A 02/22/2014   Dr. [name]: Erosive reflux esophagitis. Schatzki ring status post dilation. Gastric and duodenal erosions with benign biopsies.  [name] LAPAROSCOPIC RIGHT COLECTOMY N/A 04/16/2014   Procedure: LAPAROSCOPIC ASSISTED RIGHT COLECTOMY;  Surgeon: [name], MD;  Location: [place];  Service: General;  Laterality: N/A;  . [name] DILATION N/A 02/22/2014   Procedure: [name];  Surgeon: [name], MD;  [facility];  Service: Endoscopy;  Laterality: N/A;  . SAVORY DILATION N/A 02/22/2014   Procedure: SAVORY DILATION;  Surgeon: [name], MD;  [facility];  Service: Endoscopy;  Laterality: N/A;    There were no vitals filed for this visit.              Wound Therapy - 06/05/18 1421    Subjective  PT states that his foot is feeling better each time.     Patient and Family Stated Goals  burns to heal.     Date of Onset  05/12/18    Prior Treatments  ER, Self care, MD     Pain Score  0-No pain    Evaluation and Treatment Procedures Explained to Patient/Family  Yes    Evaluation and Treatment Procedures  agreed to    Wound Properties Date First Assessed: 05/26/18 Time First Assessed: 0820 Wound Type: Burn Location: Toe (Comment  which one) Location Orientation: Left Wound Description (Comments): 1st   Dressing Type  Gauze (Comment)    Dressing Changed  Changed    Dressing Status  Old drainage    Dressing Change Frequency  PRN    Site / Wound Assessment  Yellow    % Wound base Red or Granulating  0%    % Wound base Yellow/Fibrinous Exudate  95%    % Wound base Black/Eschar  0%    % Wound base Other/Granulation Tissue (Comment)  5%    Peri-wound Assessment  Intact   pale tissue   Drainage Amount  Minimal    Treatment  Cleansed;Debridement (Selective)    Wound Properties Date First Assessed: 05/26/18 Time First Assessed: 0830 Wound Type: Burn  Location: Toe (Comment  which one) Location Orientation: Left Wound Description (Comments): 2nd Present on Admission: Yes Final Assessment Date: 06/05/18 Final Assessment Time: 1400   Dressing Type  Gauze (Comment);Impregnated gauze (bismuth)    Dressing Status  Old drainage    Dressing Change Frequency  PRN    Site / Wound Assessment  Pale;Red    % Wound base Red or Granulating  100%    % Wound base Yellow/Fibrinous Exudate  0%    Peri-wound Assessment  Intact    Drainage Amount  --    Drainage Description  --    Treatment  --   none healed    Wound Properties Date First Assessed: 05/26/18 Time First Assessed: 0840 Wound Type: Burn Location: Toe (Comment  which one) Location Orientation: Left Wound Description (Comments): 3rd Present on Admission: Yes   Dressing Type  Gauze (Comment)    Dressing Changed  Changed    Dressing Status  Old drainage    Dressing Change Frequency  PRN    Site / Wound Assessment  Red    % Wound base Red or Granulating  --   97   % Wound base Yellow/Fibrinous Exudate  --   3   Peri-wound Assessment  Intact    Drainage Amount  Minimal    Drainage Description  Serous    Treatment  Cleansed;Debridement (Selective)    Selective Debridement - Location  Great toe and third toe.    Selective Debridement - Tools Used  Forceps;Scalpel;Scissors    Selective Debridement - Tissue Removed  slough, devitalized tissue, slough, callous    Wound Therapy - Clinical Statement  PT able to be reduced to two times a week now.  Second toe wound is now healed.  PT third toe vastly improved.  Big toe continues to have adherent slough.  Very small black bugs were crawling on pt foot.  Therapist killed 23.  When questioned pt was out cutting wood.  Therapist explained that he should refrain from doing activities like that until wounds are healed.     Wound Therapy - Functional Problem List  diffficulty donning and doffing shoes.     Factors Delaying/Impairing Wound Healing  Altered  sensation;Diabetes Mellitus;Polypharmacy;Vascular compromise    Hydrotherapy Plan  Debridement;Dressing change;Patient/family education    Wound Therapy - Frequency  2X / week   Three times a week for one week then 2x a week for 3 weeks.    Wound Therapy - Current Recommendations  PT    Wound Plan  PT to be educated on prevention of wounds with DM, educated on self care,  wounds to be debrided and dressed to maintain a healing enviornment.     Dressing   1st toe silvadene 2x2 and gauze.    Dressing   3rd toe xeroform, gauze netting to hold dressing in place.                 PT Short Term Goals - 06/05/18 1430      PT SHORT TERM GOAL #1   Title  Pt to verbalize the importance of wearing shoes at all time.    Time  1    Period  Weeks    Status  Achieved      PT SHORT TERM GOAL #2   Title  PT Lt  second and third toes to be healed     Time  2    Period  Weeks    Status  Partially Met        PT Long Term Goals - 06/05/18 1430      PT LONG TERM GOAL #1   Title  PT Lt great toe to have no eschar to prevent infection.     Time  4    Period  Weeks    Status  Achieved      PT LONG TERM GOAL #2   Title  PT LT great toe burn to have approximated to no greater than 2 x 2 to allow pt and wife to be confident in self caring until wound heals.     Time  4    Period  Weeks    Status  On-going            Plan - 06/05/18 1429    Clinical Impression Statement  as above     Rehab Potential  Good    PT Frequency  3x / week    PT Duration  4 weeks    PT Treatment/Interventions  ADLs/Self Care Home Management   sharp debridement and dressing change.    PT Next Visit Plan  Continue with sharp debridement and dressing change to promote a healthy environment for wound healing.     Consulted and Agree with Plan of Care  Patient;Family member/caregiver       Patient will benefit from skilled therapeutic intervention in order to improve the following deficits and impairments:   Decreased balance, Decreased skin integrity  Visit Diagnosis: Deep full thickness burn of toe of left foot, subsequent encounter  Partial thickness burn of left second toe, subsequent encounter  Partial thickness burn of left third toe, subsequent encounter  Difficulty in walking, not elsewhere classified     Problem List Patient Active Problem List   Diagnosis Date Noted  . CVA (cerebral vascular accident) ([place]) 01/21/2017  . CKD (chronic kidney disease) 01/21/2017  . History of colonic polyps   . Diverticulosis of colon without hemorrhage   . Adenomatous colon polyp 04/16/2014  . Preoperative cardiovascular examination 03/24/2014  . Hypertensive heart disease 03/24/2014  . Personal history of colonic polyps 03/04/2014  . Rectal bleeding 03/01/2014  . Diverticulosis of colon 03/01/2014  . Gout 12/26/2012  . Obesity 12/20/2012  . Essential hypertension, benign 10/22/2011  . Hyperlipidemia 10/22/2011  . DM (diabetes mellitus) ([place]) 10/22/2011    Rayetta Humphrey, PT CLT [phone] 06/05/2018, 2:30 PM  [place] [address] [place], [name], [address] Phone: [phone]   Fax:  [phone]  Name: TRAVAUGHN VUE MRN: [id number] Date of Birth: [birth year]

## 2018-06-06 ENCOUNTER — Ambulatory Visit (HOSPITAL_COMMUNITY): Payer: No Typology Code available for payment source

## 2018-06-10 ENCOUNTER — Other Ambulatory Visit: Payer: Self-pay

## 2018-06-10 ENCOUNTER — Ambulatory Visit (HOSPITAL_COMMUNITY): Payer: No Typology Code available for payment source | Admitting: Physical Therapy

## 2018-06-10 DIAGNOSIS — T25232D Burn of second degree of left toe(s) (nail), subsequent encounter: Secondary | ICD-10-CM

## 2018-06-10 DIAGNOSIS — R262 Difficulty in walking, not elsewhere classified: Secondary | ICD-10-CM

## 2018-06-10 DIAGNOSIS — T25332D Burn of third degree of left toe(s) (nail), subsequent encounter: Secondary | ICD-10-CM | POA: Diagnosis not present

## 2018-06-10 NOTE — Therapy (Signed)
[place], [name], [address] Phone: [phone]   Fax:  [phone]  Wound Care Therapy  Patient Details  Name: Benjamin [name] MRN: [id number] Date of Birth: [birth year] Referring Provider: [name]   Encounter Date: 06/10/2018  PT End of Session - 06/10/18 1342    Visit Number  6    Number of Visits  13    Date for PT Re-Evaluation  06/25/18    Authorization Type  [employer] (no visit limit, no auth) patient is working on Systems analyst - Visit Number  5    Authorization - Number of Visits  10    PT Start Time  1305    PT Stop Time  1340    PT Time Calculation (min)  35 min    Activity Tolerance  Patient tolerated treatment well    Behavior During Therapy  [facility] for tasks assessed/performed       Past Medical History:  Diagnosis Date  . Arthritis   . Essential hypertension   . Gout   . History of stroke    Right occipital May 2018  . Hypertensive heart disease    LVH with grade 2 diastolic dysfunction  . Mixed hyperlipidemia    Statin intolerance  . Rotator cuff tear    Chronic pain  . Sleep apnea    a. intolerant to CPAP  . Tubular adenoma   . Type 2 diabetes mellitus ([place])     Past Surgical History:  Procedure Laterality Date  . APPENDECTOMY    . COLONOSCOPY N/A 10/29/2013   Dr. [name]- sigmoid polyp status post cold snare removal. large sessile ascending colon polyp. debulked with saline assisted snare polypectomy . pancolonic diverticulosis. inadequate prep. tubular adenoma on bx  . COLONOSCOPY N/A 02/22/2014   Dr. [name]: Saline-assisted snare polypectomy/biopsy for sprawling carpet polyp in the ascending colon which could not be completely removed. Status post biopsy (tubular adenoma), tattooed  . COLONOSCOPY N/A 03/01/2014   [id number] polypectomy hemorrhage s/p bleeding  . COLONOSCOPY N/A 06/08/2015   Procedure: COLONOSCOPY;  Surgeon: [name], MD;  [facility];  Service: Endoscopy;  Laterality: N/A;  0900  . ESOPHAGOGASTRODUODENOSCOPY N/A 02/22/2014   Dr. [name]: Erosive reflux esophagitis. Schatzki ring status post dilation. Gastric and duodenal erosions with benign biopsies.  [name] LAPAROSCOPIC RIGHT COLECTOMY N/A 04/16/2014   Procedure: LAPAROSCOPIC ASSISTED RIGHT COLECTOMY;  Surgeon: [name], MD;  Location: [place];  Service: General;  Laterality: N/A;  . [name] DILATION N/A 02/22/2014   Procedure: [name];  Surgeon: [name], MD;  [facility];  Service: Endoscopy;  Laterality: N/A;  . SAVORY DILATION N/A 02/22/2014   Procedure: SAVORY DILATION;  Surgeon: [name], MD;  [facility];  Service: Endoscopy;  Laterality: N/A;    There were no vitals filed for this visit.              Wound Therapy - 06/10/18 1336    Subjective  Pt states that he is not having any pain     Patient and Family Stated Goals  burns to heal.     Date of Onset  05/12/18    Prior Treatments  ER, Self care, MD     Pain Scale  0-10    Pain Score  0-No pain    Evaluation and Treatment Procedures Explained to Patient/Family  Yes  Evaluation and Treatment Procedures  agreed to    Wound Properties Date First Assessed: 05/26/18 Time First Assessed: 0820 Wound Type: Burn Location: Toe (Comment  which one) Location Orientation: Left Wound Description (Comments): 1st   Dressing Type  Gauze (Comment)    Dressing Status  Old drainage    Dressing Change Frequency  PRN    Site / Wound Assessment  Yellow    % Wound base Red or Granulating  0%    % Wound base Yellow/Fibrinous Exudate  65%    % Wound base Black/Eschar  0%    % Wound base Other/Granulation Tissue (Comment)  35%    Peri-wound Assessment  Intact   pale tissue   Wound Length (cm)  2.9 cm    Wound Width (cm)  1.9 cm    Wound Depth (cm)  0.1 cm    Wound Volume (cm^3)  0.55 cm^3    Wound Surface Area (cm^2)  5.51 cm^2    Margins  Unattached edges  (unapproximated)   debrided thus wound appears longer in length.    Drainage Amount  Minimal    Treatment  Cleansed;Debridement (Selective)    Wound Properties Date First Assessed: 05/26/18 Time First Assessed: 0840 Wound Type: Burn Location: Toe (Comment  which one) Location Orientation: Left Wound Description (Comments): 3rd Present on Admission: Yes   Dressing Type  Gauze (Comment)    Dressing Changed  Changed    Dressing Status  Old drainage    Dressing Change Frequency  PRN    Site / Wound Assessment  Red    % Wound base Red or Granulating  100%    % Wound base Yellow/Fibrinous Exudate  --    Peri-wound Assessment  Intact    Wound Length (cm)  0.8 cm    Wound Width (cm)  0.3 cm    Wound Depth (cm)  0.15 cm    Wound Volume (cm^3)  0.04 cm^3    Wound Surface Area (cm^2)  0.24 cm^2    Drainage Amount  Minimal    Drainage Description  Serous    Treatment  Cleansed;Debridement (Selective)    Selective Debridement - Location  Great toe and third toe.    Selective Debridement - Tools Used  Forceps;Scalpel;Scissors    Selective Debridement - Tissue Removed  slough, devitalized tissue, slough, callous    Wound Therapy - Clinical Statement  PT great toe edges were unattached on lower 1/2 therapist debrided area away.  Decreased yellow slough noted on great toe.  Middle toe completely granualted at this time but continues to have depth.     Wound Therapy - Functional Problem List  diffficulty donning and doffing shoes.     Factors Delaying/Impairing Wound Healing  Altered sensation;Diabetes Mellitus;Polypharmacy;Vascular compromise    Hydrotherapy Plan  Debridement;Dressing change;Patient/family education    Wound Therapy - Frequency  2X / week   Three times a week for one week then 2x a week for 3 weeks.    Wound Therapy - Current Recommendations  PT    Wound Plan  PT to be educated on prevention of wounds with DM, educated on self care, wounds to be debrided and dressed to maintain a  healing enviornment.     Dressing   1st toe silvadene 2x2 and gauze.    Dressing   3rd toe xeroform, gauze netting to hold dressing in place.                 PT Short Term Goals -  06/05/18 1430      PT SHORT TERM GOAL #1   Title  Pt to verbalize the importance of wearing shoes at all time.    Time  1    Period  Weeks    Status  Achieved      PT SHORT TERM GOAL #2   Title  PT Lt  second and third toes to be healed     Time  2    Period  Weeks    Status  Partially Met        PT Long Term Goals - 06/05/18 1430      PT LONG TERM GOAL #1   Title  PT Lt great toe to have no eschar to prevent infection.     Time  4    Period  Weeks    Status  Achieved      PT LONG TERM GOAL #2   Title  PT LT great toe burn to have approximated to no greater than 2 x 2 to allow pt and wife to be confident in self caring until wound heals.     Time  4    Period  Weeks    Status  On-going            Plan - 06/10/18 1343    Clinical Impression Statement  see above     Rehab Potential  Good    PT Frequency  2x / week    PT Duration  4 weeks    PT Treatment/Interventions  ADLs/Self Care Home Management   sharp debridement and dressing change.    PT Next Visit Plan  Continue with sharp debridement and dressing change to promote a healthy environment for wound healing.     Consulted and Agree with Plan of Care  Patient;Family member/caregiver       Patient will benefit from skilled therapeutic intervention in order to improve the following deficits and impairments:  Decreased balance, Decreased skin integrity  Visit Diagnosis: Deep full thickness burn of toe of left foot, subsequent encounter  Partial thickness burn of left third toe, subsequent encounter  Difficulty in walking, not elsewhere classified     Problem List Patient Active Problem List   Diagnosis Date Noted  . CVA (cerebral vascular accident) ([place]) 01/21/2017  . CKD (chronic kidney disease) 01/21/2017  .  History of colonic polyps   . Diverticulosis of colon without hemorrhage   . Adenomatous colon polyp 04/16/2014  . Preoperative cardiovascular examination 03/24/2014  . Hypertensive heart disease 03/24/2014  . Personal history of colonic polyps 03/04/2014  . Rectal bleeding 03/01/2014  . Diverticulosis of colon 03/01/2014  . Gout 12/26/2012  . Obesity 12/20/2012  . Essential hypertension, benign 10/22/2011  . Hyperlipidemia 10/22/2011  . DM (diabetes mellitus) ([place]) 10/22/2011    Rayetta Humphrey, PT CLT [phone] 06/10/2018, 1:44 PM  [place] [address] [place], [name], [address] Phone: [phone]   Fax:  [phone]  Name: Benjamin [name] MRN: [id number] Date of Birth: November 19, 1950

## 2018-06-13 ENCOUNTER — Telehealth (HOSPITAL_COMMUNITY): Payer: Self-pay

## 2018-06-13 ENCOUNTER — Ambulatory Visit (HOSPITAL_COMMUNITY): Payer: No Typology Code available for payment source

## 2018-06-13 NOTE — Telephone Encounter (Signed)
06/13/18  wife called to cx said they had a message that the person he was scheduled for couldn't see him... wondered why we waited so long and so late to figure that out....  already had plans for later today and couldn't come any other time

## 2018-06-16 ENCOUNTER — Other Ambulatory Visit: Payer: Self-pay

## 2018-06-16 ENCOUNTER — Ambulatory Visit (HOSPITAL_COMMUNITY): Payer: No Typology Code available for payment source

## 2018-06-16 ENCOUNTER — Encounter (HOSPITAL_COMMUNITY): Payer: Self-pay

## 2018-06-16 DIAGNOSIS — T25332D Burn of third degree of left toe(s) (nail), subsequent encounter: Secondary | ICD-10-CM | POA: Diagnosis not present

## 2018-06-16 DIAGNOSIS — R262 Difficulty in walking, not elsewhere classified: Secondary | ICD-10-CM

## 2018-06-16 DIAGNOSIS — T25232D Burn of second degree of left toe(s) (nail), subsequent encounter: Secondary | ICD-10-CM

## 2018-06-16 NOTE — Therapy (Signed)
Benjamin [name], [name], [address] Phone: [phone]   Fax:  [phone]  Wound Care Therapy  Patient Details  Name: Benjamin [name] MRN: [id number] Date of Birth: 11/08/1950 Referring Provider (PT): [name]   Encounter Date: 06/16/2018   PT End of Session - 06/16/18 1215    Visit Number  7    Number of Visits  13    Date for PT Re-Evaluation  06/25/18    Authorization Type  [employer] (no visit limit, no auth) patient is working on Systems analyst - Visit Number  6    Authorization - Number of Visits  10    PT Start Time  1120    PT Stop Time  1200    PT Time Calculation (min)  40 min    Activity Tolerance  Patient tolerated treatment well    Behavior During Therapy  [facility] for tasks assessed/performed       Past Medical History:  Diagnosis Date  . Arthritis   . Essential hypertension   . Gout   . History of stroke    Right occipital May 2018  . Hypertensive heart disease    LVH with grade 2 diastolic dysfunction  . Mixed hyperlipidemia    Statin intolerance  . Rotator cuff tear    Chronic pain  . Sleep apnea    a. intolerant to CPAP  . Tubular adenoma   . Type 2 diabetes mellitus ([place])     Past Surgical History:  Procedure Laterality Date  . APPENDECTOMY    . COLONOSCOPY N/A 10/29/2013   Dr. [name]- sigmoid polyp status post cold snare removal. large sessile ascending colon polyp. debulked with saline assisted snare polypectomy . pancolonic diverticulosis. inadequate prep. tubular adenoma on bx  . COLONOSCOPY N/A 02/22/2014   Dr. [name]: Saline-assisted snare polypectomy/biopsy for sprawling carpet polyp in the ascending colon which could not be completely removed. Status post biopsy (tubular adenoma), tattooed  . COLONOSCOPY N/A 03/01/2014   EXH:BZJI polypectomy hemorrhage s/p bleeding  . COLONOSCOPY N/A 06/08/2015   Procedure: COLONOSCOPY;  Surgeon: [name], MD;   [facility];  Service: Endoscopy;  Laterality: N/A;  0900  . ESOPHAGOGASTRODUODENOSCOPY N/A 02/22/2014   Dr. [name]: Erosive reflux esophagitis. Schatzki ring status post dilation. Gastric and duodenal erosions with benign biopsies.  [name] LAPAROSCOPIC RIGHT COLECTOMY N/A 04/16/2014   Procedure: LAPAROSCOPIC ASSISTED RIGHT COLECTOMY;  Surgeon: [name], MD;  Location: [facility];  Service: General;  Laterality: N/A;  . [name] DILATION N/A 02/22/2014   Procedure: [name];  Surgeon: [name], MD;  [facility];  Service: Endoscopy;  Laterality: N/A;  . SAVORY DILATION N/A 02/22/2014   Procedure: SAVORY DILATION;  Surgeon: [name], MD;  [facility];  Service: Endoscopy;  Laterality: N/A;    There were no vitals filed for this visit.    Wound Therapy - 06/16/18 1216    Subjective  Patient denies pain today and reports he is still wearing shoes durign all activities now.     Patient and Family Stated Goals  burns to heal.     Date of Onset  05/12/18    Prior Treatments  ER, Self care, MD     Pain Scale  0-10    Pain Score  0-No pain    Evaluation and Treatment Procedures Explained to Patient/Family  Yes    Evaluation  and Treatment Procedures  agreed to    Wound Properties Date First Assessed: 05/26/18 Time First Assessed: 0820 Wound Type: Burn Location: Toe (Comment  which one) Location Orientation: Left Wound Description (Comments): 1st   Dressing Type  Gauze (Comment)    Dressing Changed  Changed    Dressing Status  Old drainage    Dressing Change Frequency  PRN    Site / Wound Assessment  Yellow    % Wound base Red or Granulating  0%    % Wound base Yellow/Fibrinous Exudate  60%    % Wound base Black/Eschar  0%    % Wound base Other/Granulation Tissue (Comment)  40%    Peri-wound Assessment  Intact   pale tissue   Margins  Unattached edges (unapproximated)   debrided thus wound appears longer in length.    Drainage Amount  Minimal     Treatment  Cleansed;Debridement (Selective)    Wound Properties Date First Assessed: 05/26/18 Time First Assessed: 0840 Wound Type: Burn Location: Toe (Comment  which one) Location Orientation: Left Wound Description (Comments): 3rd Present on Admission: Yes   Dressing Type  Gauze (Comment)    Dressing Changed  Changed    Dressing Status  Old drainage    Dressing Change Frequency  PRN    Site / Wound Assessment  Red    % Wound base Red or Granulating  100%    Peri-wound Assessment  Intact    Drainage Amount  None    Drainage Description  --    Treatment  Cleansed;Debridement (Selective)    Selective Debridement - Location  Great toe and third toe.    Selective Debridement - Tools Used  Forceps;Scalpel;Scissors    Selective Debridement - Tissue Removed  slough, devitalized tissue, slough, callous    Wound Therapy - Clinical Statement  Great toe wound has some improved granulation tissue and decreased slough. Slough that is present is firmly adhered to wound bed. Margins are unattached and calloused and selective debridement performed to promote approximation. Middle toe wound is 100% granulated and edges were calloused. Removed callous to promote healing environment and continued with xeroform.     Wound Therapy - Functional Problem List  diffficulty donning and doffing shoes.     Factors Delaying/Impairing Wound Healing  Altered sensation;Diabetes Mellitus;Polypharmacy;Vascular compromise    Hydrotherapy Plan  Debridement;Dressing change;Patient/family education    Wound Therapy - Frequency  2X / week   Three times a week for one week then 2x a week for 3 weeks.    Wound Therapy - Current Recommendations  PT    Wound Plan  PT to be educated on prevention of wounds with DM, educated on self care, wounds to be debrided and dressed to maintain a healing enviornment.     Dressing   1st toe silvadene 2x2 and gauze.    Dressing   3rd toe xeroform, gauze netting to hold dressing in place.           PT Short Term Goals - 06/05/18 1430      PT SHORT TERM GOAL #1   Title  Pt to verbalize the importance of wearing shoes at all time.    Time  1    Period  Weeks    Status  Achieved      PT SHORT TERM GOAL #2   Title  PT Lt  second and third toes to be healed     Time  2    Period  Weeks  Status  Partially Met        PT Long Term Goals - 06/05/18 1430      PT LONG TERM GOAL #1   Title  PT Lt great toe to have no eschar to prevent infection.     Time  4    Period  Weeks    Status  Achieved      PT LONG TERM GOAL #2   Title  PT LT great toe burn to have approximated to no greater than 2 x 2 to allow pt and wife to be confident in self caring until wound heals.     Time  4    Period  Weeks    Status  On-going         Plan - 06/16/18 1216    Clinical Impression Statement  see above    Rehab Potential  Good    PT Frequency  2x / week    PT Duration  4 weeks    PT Treatment/Interventions  ADLs/Self Care Home Management   sharp debridement and dressing change.    PT Next Visit Plan  Continue with sharp debridement and dressing change to promote a healthy environment for wound healing.     Consulted and Agree with Plan of Care  Patient;Family member/caregiver       Patient will benefit from skilled therapeutic intervention in order to improve the following deficits and impairments:  Decreased balance, Decreased skin integrity  Visit Diagnosis: Deep full thickness burn of toe of left foot, subsequent encounter  Partial thickness burn of left third toe, subsequent encounter  Difficulty in walking, not elsewhere classified  Partial thickness burn of left second toe, subsequent encounter     Problem List Patient Active Problem List   Diagnosis Date Noted  . CVA (cerebral vascular accident) ([place]) 01/21/2017  . CKD (chronic kidney disease) 01/21/2017  . History of colonic polyps   . Diverticulosis of colon without hemorrhage   . Adenomatous colon polyp  04/16/2014  . Preoperative cardiovascular examination 03/24/2014  . Hypertensive heart disease 03/24/2014  . Personal history of colonic polyps 03/04/2014  . Rectal bleeding 03/01/2014  . Diverticulosis of colon 03/01/2014  . Gout 12/26/2012  . Obesity 12/20/2012  . Essential hypertension, benign 10/22/2011  . Hyperlipidemia 10/22/2011  . DM (diabetes mellitus) ([place]) 10/22/2011    [name], PT, DPT Physical Therapist with [facility]  06/16/2018 12:26 PM    [facility] [address] [place], [name], [address] Phone: [phone]   Fax:  [phone]  Name: GIRARD KOONTZ MRN: [id number] Date of Birth: [birth year]

## 2018-06-18 ENCOUNTER — Ambulatory Visit (HOSPITAL_COMMUNITY): Payer: Medicare HMO | Attending: Physician Assistant | Admitting: Physical Therapy

## 2018-06-18 ENCOUNTER — Other Ambulatory Visit: Payer: Self-pay

## 2018-06-18 DIAGNOSIS — T25232D Burn of second degree of left toe(s) (nail), subsequent encounter: Secondary | ICD-10-CM | POA: Insufficient documentation

## 2018-06-18 DIAGNOSIS — T25232S Burn of second degree of left toe(s) (nail), sequela: Secondary | ICD-10-CM | POA: Diagnosis not present

## 2018-06-18 DIAGNOSIS — R262 Difficulty in walking, not elsewhere classified: Secondary | ICD-10-CM | POA: Diagnosis not present

## 2018-06-18 DIAGNOSIS — T25332D Burn of third degree of left toe(s) (nail), subsequent encounter: Secondary | ICD-10-CM | POA: Insufficient documentation

## 2018-06-18 NOTE — Therapy (Addendum)
[facility] [place], [name], [address] Phone: [phone]   Fax:  [phone]  Wound Care Therapy  Patient Details  Name: Benjamin [name] MRN: [id number] Date of Birth: [birth year] Referring Provider (PT): [name]   Encounter Date: 06/18/2018  PT End of Session - 06/18/18 0943    Visit Number  8    Number of Visits  13    Date for PT Re-Evaluation  06/25/18    Authorization Type  [employer] (no visit limit, no auth) patient is working on Systems analyst - Visit Number  6    Authorization - Number of Visits  10    PT Start Time  0900    PT Stop Time  0940    PT Time Calculation (min)  40 min    Activity Tolerance  Patient tolerated treatment well    Behavior During Therapy  [facility] for tasks assessed/performed       Past Medical History:  Diagnosis Date  . Arthritis   . Essential hypertension   . Gout   . History of stroke    Right occipital May 2018  . Hypertensive heart disease    LVH with grade 2 diastolic dysfunction  . Mixed hyperlipidemia    Statin intolerance  . Rotator cuff tear    Chronic pain  . Sleep apnea    a. intolerant to CPAP  . Tubular adenoma   . Type 2 diabetes mellitus ([place])     Past Surgical History:  Procedure Laterality Date  . APPENDECTOMY    . COLONOSCOPY N/A 10/29/2013   Dr. [name]- sigmoid polyp status post cold snare removal. large sessile ascending colon polyp. debulked with saline assisted snare polypectomy . pancolonic diverticulosis. inadequate prep. tubular adenoma on bx  . COLONOSCOPY N/A 02/22/2014   Dr. [name]: Saline-assisted snare polypectomy/biopsy for sprawling carpet polyp in the ascending colon which could not be completely removed. Status post biopsy (tubular adenoma), tattooed  . COLONOSCOPY N/A 03/01/2014   QPR:FFMB polypectomy hemorrhage s/p bleeding  . COLONOSCOPY N/A 06/08/2015   Procedure: COLONOSCOPY;  Surgeon: [name], MD;   [facility];  Service: Endoscopy;  Laterality: N/A;  0900  . ESOPHAGOGASTRODUODENOSCOPY N/A 02/22/2014   Dr. [name]: Erosive reflux esophagitis. Schatzki ring status post dilation. Gastric and duodenal erosions with benign biopsies.  [name] LAPAROSCOPIC RIGHT COLECTOMY N/A 04/16/2014   Procedure: LAPAROSCOPIC ASSISTED RIGHT COLECTOMY;  Surgeon: [name], MD;  Location: [facility];  Service: General;  Laterality: N/A;  . [name] DILATION N/A 02/22/2014   Procedure: [name];  Surgeon: [name], MD;  [facility];  Service: Endoscopy;  Laterality: N/A;  . SAVORY DILATION N/A 02/22/2014   Procedure: SAVORY DILATION;  Surgeon: [name], MD;  [facility];  Service: Endoscopy;  Laterality: N/A;    There were no vitals filed for this visit.              Wound Therapy - 06/18/18 0939    Subjective  Pt states that he tried to drive his truck with a clutch and was unable do to increased pain when he went to push on the clutch.     Patient and Family Stated Goals  burns to heal.     Date of Onset  05/12/18    Prior Treatments  ER, Self care, MD     Pain Score  0-No pain  Evaluation and Treatment Procedures Explained to Patient/Family  Yes    Evaluation and Treatment Procedures  agreed to    Wound Properties Date First Assessed: 05/26/18 Time First Assessed: 0820 Wound Type: Burn Location: Toe (Comment  which one) Location Orientation: Left Wound Description (Comments): 1st   Dressing Type  Gauze (Comment)    Dressing Status  Old drainage    Dressing Change Frequency  PRN    Site / Wound Assessment  Yellow    % Wound base Red or Granulating  0%    % Wound base Yellow/Fibrinous Exudate  50%    % Wound base Black/Eschar  0%    % Wound base Other/Granulation Tissue (Comment)  50%    Peri-wound Assessment  Intact   pale tissue   Wound Length (cm)  2.5 cm    Wound Width (cm)  1.8 cm    Wound Depth (cm)  0.1 cm    Wound Volume (cm^3)  0.45  cm^3    Wound Surface Area (cm^2)  4.5 cm^2    Margins  Unattached edges (unapproximated)   debrided thus wound appears longer in length.    Drainage Amount  Minimal    Treatment  Cleansed;Debridement (Selective)    Wound Properties Date First Assessed: 05/26/18 Time First Assessed: 0840 Wound Type: Burn Location: Toe (Comment  which one) Location Orientation: Left Wound Description (Comments): 3rd Present on Admission: Yes   Dressing Type  Gauze (Comment)    Dressing Changed  Changed    Dressing Status  Old drainage    Dressing Change Frequency  PRN    Site / Wound Assessment  Red    % Wound base Red or Granulating  100%    Peri-wound Assessment  Intact    Wound Length (cm)  0.5 cm    Wound Width (cm)  0.2 cm    Wound Surface Area (cm^2)  0.1 cm^2    Drainage Amount  None    Treatment  Cleansed;Debridement (Selective)    Selective Debridement - Location  Great toe and third toe.    Selective Debridement - Tools Used  Forceps;Scalpel;Scissors    Selective Debridement - Tissue Removed  slough, devitalized tissue, slough, callous    Wound Therapy - Clinical Statement  Great toe continues to improve in granulation; therefore d/c silvadene and changed to honey.  Anticipate total healing of third toe by next treatment.  Pt continues to need skilled therapy for debridement of the slough from his great toe     Wound Therapy - Functional Problem List  diffficulty donning and doffing shoes.     Factors Delaying/Impairing Wound Healing  Altered sensation;Diabetes Mellitus;Polypharmacy;Vascular compromise    Hydrotherapy Plan  Debridement;Dressing change;Patient/family education    Wound Therapy - Frequency  2X / week   Three times a week for one week then 2x a week for 7 weeks.    Wound Therapy - Current Recommendations  PT    Wound Plan  PT to be educated on prevention of wounds with DM, educated on self care, wounds to be debrided and dressed to maintain a healing enviornment.     Dressing    1st toe honey  2x2 and gauze.    Dressing   3rd toe , gauze netting to hold dressing in place.                 PT Short Term Goals - 06/05/18 1430      PT SHORT TERM GOAL #1   Title  Pt to verbalize the importance of wearing shoes at all time.    Time  1    Period  Weeks    Status  Achieved      PT SHORT TERM GOAL #2   Title  PT Lt  second and third toes to be healed     Time  2    Period  Weeks    Status  Partially Met        PT Long Term Goals - 06/05/18 1430      PT LONG TERM GOAL #1   Title  PT Lt great toe to have no eschar to prevent infection.     Time  4    Period  Weeks    Status  Achieved      PT LONG TERM GOAL #2   Title  PT LT great toe burn to have approximated to no greater than 2 x 2 to allow pt and wife to be confident in self caring until wound heals.     Time  4    Period  Weeks    Status  On-going            Plan - 06/18/18 0944    Clinical Impression Statement  see above.     Rehab Potential  Good    PT Frequency  2x / week    PT Duration  4 weeks    PT Treatment/Interventions  ADLs/Self Care Home Management   sharp debridement and dressing change.    PT Next Visit Plan  Continue with sharp debridement and dressing change to promote a healthy environment for wound healing.     Consulted and Agree with Plan of Care  Patient;Family member/caregiver       Patient will benefit from skilled therapeutic intervention in order to improve the following deficits and impairments:  Decreased balance, Decreased skin integrity  Visit Diagnosis: Difficulty in walking, not elsewhere classified  Second degree burn of great toe of foot, left, sequela     Problem List Patient Active Problem List   Diagnosis Date Noted  . CVA (cerebral vascular accident) ([place]) 01/21/2017  . CKD (chronic kidney disease) 01/21/2017  . History of colonic polyps   . Diverticulosis of colon without hemorrhage   . Adenomatous colon polyp 04/16/2014  .  Preoperative cardiovascular examination 03/24/2014  . Hypertensive heart disease 03/24/2014  . Personal history of colonic polyps 03/04/2014  . Rectal bleeding 03/01/2014  . Diverticulosis of colon 03/01/2014  . Gout 12/26/2012  . Obesity 12/20/2012  . Essential hypertension, benign 10/22/2011  . Hyperlipidemia 10/22/2011  . DM (diabetes mellitus) ([place]) 10/22/2011    Rayetta Humphrey, PT CLT [phone] 06/18/2018, 9:47 AM  [facility] [address] [place], [name], [address] Phone: [phone]   Fax:  [phone]  Name: Benjamin [name] MRN: [id number] Date of Birth: 1950-10-13

## 2018-06-20 ENCOUNTER — Ambulatory Visit (HOSPITAL_COMMUNITY): Payer: Medicare HMO | Admitting: Physical Therapy

## 2018-06-23 ENCOUNTER — Other Ambulatory Visit: Payer: Self-pay

## 2018-06-23 ENCOUNTER — Ambulatory Visit (HOSPITAL_COMMUNITY): Payer: Medicare HMO

## 2018-06-23 ENCOUNTER — Encounter (HOSPITAL_COMMUNITY): Payer: Self-pay

## 2018-06-23 DIAGNOSIS — T25232S Burn of second degree of left toe(s) (nail), sequela: Secondary | ICD-10-CM | POA: Diagnosis not present

## 2018-06-23 DIAGNOSIS — R262 Difficulty in walking, not elsewhere classified: Secondary | ICD-10-CM | POA: Diagnosis not present

## 2018-06-23 DIAGNOSIS — T25232D Burn of second degree of left toe(s) (nail), subsequent encounter: Secondary | ICD-10-CM

## 2018-06-23 DIAGNOSIS — T25332D Burn of third degree of left toe(s) (nail), subsequent encounter: Secondary | ICD-10-CM | POA: Diagnosis not present

## 2018-06-23 NOTE — Therapy (Signed)
[place], [name], [address] Phone: [phone]   Fax:  [phone]  Wound Care Therapy  Patient Details  Name: Benjamin [name] MRN: [id number] Date of Birth: 27-Mar-1951 Referring Provider (PT): [name]   Encounter Date: 06/23/2018  PT End of Session - 06/23/18 1121    Visit Number  9    Number of Visits  13    Date for PT Re-Evaluation  06/25/18    Authorization Type  [employer] (no visit limit, no auth) patient is working on Systems analyst - Visit Number  7    Authorization - Number of Visits  10    PT Start Time  1040    PT Stop Time  1115    PT Time Calculation (min)  35 min    Activity Tolerance  Patient tolerated treatment well    Behavior During Therapy  [facility] for tasks assessed/performed       Past Medical History:  Diagnosis Date  . Arthritis   . Essential hypertension   . Gout   . History of stroke    Right occipital May 2018  . Hypertensive heart disease    LVH with grade 2 diastolic dysfunction  . Mixed hyperlipidemia    Statin intolerance  . Rotator cuff tear    Chronic pain  . Sleep apnea    a. intolerant to CPAP  . Tubular adenoma   . Type 2 diabetes mellitus ([place])     Past Surgical History:  Procedure Laterality Date  . APPENDECTOMY    . COLONOSCOPY N/A 10/29/2013   Dr. [name]- sigmoid polyp status post cold snare removal. large sessile ascending colon polyp. debulked with saline assisted snare polypectomy . pancolonic diverticulosis. inadequate prep. tubular adenoma on bx  . COLONOSCOPY N/A 02/22/2014   Dr. [name]: Saline-assisted snare polypectomy/biopsy for sprawling carpet polyp in the ascending colon which could not be completely removed. Status post biopsy (tubular adenoma), tattooed  . COLONOSCOPY N/A 03/01/2014   XBD:ZHGD polypectomy hemorrhage s/p bleeding  . COLONOSCOPY N/A 06/08/2015   Procedure: COLONOSCOPY;  Surgeon: [name], MD;   [facility];  Service: Endoscopy;  Laterality: N/A;  0900  . ESOPHAGOGASTRODUODENOSCOPY N/A 02/22/2014   Dr. [name]: Erosive reflux esophagitis. Schatzki ring status post dilation. Gastric and duodenal erosions with benign biopsies.  [name] LAPAROSCOPIC RIGHT COLECTOMY N/A 04/16/2014   Procedure: LAPAROSCOPIC ASSISTED RIGHT COLECTOMY;  Surgeon: [name], MD;  Location: [facility];  Service: General;  Laterality: N/A;  . [name] DILATION N/A 02/22/2014   Procedure: [name];  Surgeon: [name], MD;  [facility];  Service: Endoscopy;  Laterality: N/A;  . SAVORY DILATION N/A 02/22/2014   Procedure: SAVORY DILATION;  Surgeon: [name], MD;  [facility];  Service: Endoscopy;  Laterality: N/A;    There were no vitals filed for this visit.   Wound Therapy - 06/23/18 1118    Subjective  Patient denies pain upon arrival. He and his wife have to go to the hospital for an appointment for his wife after this treatment sessoin.    Patient and Family Stated Goals  burns to heal.     Date of Onset  05/12/18    Prior Treatments  ER, Self care, MD     Pain Scale  0-10    Pain Score  0-No pain    Evaluation and Treatment Procedures Explained to  Patient/Family  Yes    Evaluation and Treatment Procedures  agreed to    Wound Properties Date First Assessed: 05/26/18 Time First Assessed: 0820 Wound Type: Burn Location: Toe (Comment  which one) Location Orientation: Left Wound Description (Comments): 1st   Dressing Type  Gauze (Comment)    Dressing Changed  Changed    Dressing Status  Old drainage    Dressing Change Frequency  PRN    Site / Wound Assessment  Yellow;Pink    % Wound base Red or Granulating  0%    % Wound base Yellow/Fibrinous Exudate  40%    % Wound base Black/Eschar  0%    % Wound base Other/Granulation Tissue (Comment)  60%    Peri-wound Assessment  Intact   pale tissue   Margins  Unattached edges (unapproximated)   debrided thus wound  appears longer in length.    Drainage Amount  Minimal    Treatment  Cleansed;Debridement (Selective)    Wound Properties Date First Assessed: 05/26/18 Time First Assessed: 0840 Wound Type: Burn Location: Toe (Comment  which one) Location Orientation: Left Wound Description (Comments): 3rd Present on Admission: Yes   Dressing Type  Gauze (Comment)    Dressing Changed  Changed    Dressing Status  Old drainage    Dressing Change Frequency  PRN    Site / Wound Assessment  Red    % Wound base Red or Granulating  100%    Peri-wound Assessment  Intact    Drainage Amount  None    Treatment  Cleansed    Selective Debridement - Location  Great toe    Selective Debridement - Tools Used  Forceps;Scissors    Selective Debridement - Tissue Removed  slough, devitalized tissue, slough, callous    Wound Therapy - Clinical Statement  Patient's wounds are progressing well and granulation is improving on great toe. Third toe has approximated and no debridement needed at this time. Third toe is dry with a scab and petroleum jelly was applied to promote scar healing. Selective debridement continued to great toe and medihoney applied to wound bed. He will continue to benefit from skilled therapy to promote optimal healing of remaining great to wound. He was educated on benefit sof darco shoe to unweight wound on great toe and will discuss this again next session as we do not have the correct size available for him today.    Wound Therapy - Functional Problem List  diffficulty donning and doffing shoes.     Factors Delaying/Impairing Wound Healing  Altered sensation;Diabetes Mellitus;Polypharmacy;Vascular compromise    Hydrotherapy Plan  Debridement;Dressing change;Patient/family education    Wound Therapy - Frequency  2X / week   Three times a week for one week then 2x a week for 7 weeks.    Wound Therapy - Current Recommendations  PT    Wound Plan  PT to be educated on prevention of wounds with DM, educated on self  care, wounds to be debrided and dressed to maintain a healing enviornment.     Dressing   1st toe, medihoney, 2x2 and gauze.    Dressing   3rd toe , petroleum jelly, gauze netting to hold dressing in place.         PT Short Term Goals - 06/05/18 1430      PT SHORT TERM GOAL #1   Title  Pt to verbalize the importance of wearing shoes at all time.    Time  1    Period  Weeks  Status  Achieved      PT SHORT TERM GOAL #2   Title  PT Lt  second and third toes to be healed     Time  2    Period  Weeks    Status  Partially Met        PT Long Term Goals - 06/05/18 1430      PT LONG TERM GOAL #1   Title  PT Lt great toe to have no eschar to prevent infection.     Time  4    Period  Weeks    Status  Achieved      PT LONG TERM GOAL #2   Title  PT LT great toe burn to have approximated to no greater than 2 x 2 to allow pt and wife to be confident in self caring until wound heals.     Time  4    Period  Weeks    Status  On-going        Plan - 06/23/18 1122    Clinical Impression Statement  see above    Rehab Potential  Good    PT Frequency  2x / week    PT Duration  4 weeks    PT Treatment/Interventions  ADLs/Self Care Home Management   sharp debridement and dressing change.    PT Next Visit Plan  Continue with sharp debridement and dressing change to promote a healthy environment for wound healing.     Consulted and Agree with Plan of Care  Patient;Family member/caregiver       Patient will benefit from skilled therapeutic intervention in order to improve the following deficits and impairments:  Decreased balance, Decreased skin integrity  Visit Diagnosis: Difficulty in walking, not elsewhere classified  Second degree burn of great toe of foot, left, sequela  Deep full thickness burn of toe of left foot, subsequent encounter  Partial thickness burn of left third toe, subsequent encounter  Partial thickness burn of left second toe, subsequent  encounter     Problem List Patient Active Problem List   Diagnosis Date Noted  . CVA (cerebral vascular accident) ([place]) 01/21/2017  . CKD (chronic kidney disease) 01/21/2017  . History of colonic polyps   . Diverticulosis of colon without hemorrhage   . Adenomatous colon polyp 04/16/2014  . Preoperative cardiovascular examination 03/24/2014  . Hypertensive heart disease 03/24/2014  . Personal history of colonic polyps 03/04/2014  . Rectal bleeding 03/01/2014  . Diverticulosis of colon 03/01/2014  . Gout 12/26/2012  . Obesity 12/20/2012  . Essential hypertension, benign 10/22/2011  . Hyperlipidemia 10/22/2011  . DM (diabetes mellitus) ([place]) 10/22/2011    [name], PT, DPT Physical Therapist with [facility]  06/23/2018 11:23 AM    [place], [name], [address] Phone: [phone]   Fax:  [phone]  Name: Benjamin [name] MRN: [id number] Date of Birth: 09-09-1951

## 2018-06-26 ENCOUNTER — Ambulatory Visit (HOSPITAL_COMMUNITY): Payer: Medicare HMO | Admitting: Physical Therapy

## 2018-06-26 ENCOUNTER — Encounter (HOSPITAL_COMMUNITY): Payer: Self-pay | Admitting: Physical Therapy

## 2018-06-26 DIAGNOSIS — T25232D Burn of second degree of left toe(s) (nail), subsequent encounter: Secondary | ICD-10-CM | POA: Diagnosis not present

## 2018-06-26 DIAGNOSIS — T25232S Burn of second degree of left toe(s) (nail), sequela: Secondary | ICD-10-CM

## 2018-06-26 DIAGNOSIS — R262 Difficulty in walking, not elsewhere classified: Secondary | ICD-10-CM | POA: Diagnosis not present

## 2018-06-26 DIAGNOSIS — T25332D Burn of third degree of left toe(s) (nail), subsequent encounter: Secondary | ICD-10-CM | POA: Diagnosis not present

## 2018-06-26 NOTE — Therapy (Signed)
[place], [name], [address] Phone: [phone]   Fax:  [phone]  Wound Care Therapy  Patient Details  Name: Benjamin [name] MRN: [id number] Date of Birth: 1951/02/19 Referring Provider (PT): [name]   Encounter Date: 06/26/2018   Progress Note Reporting Period 05/26/2018  to 06/26/2018  See note below for Objective Data and Assessment of Progress/Goals.      PT End of Session - 06/26/18 1201    Visit Number  10    Number of Visits  13    Date for PT Re-Evaluation  06/25/18    Authorization Type  [employer] (no visit limit, no auth) patient is working on getting [employer] coverage    Authorization Time Period  cert runs out on 10/22/971    Authorization - Visit Number  10    Authorization - Number of Visits  18    PT Start Time  5329    PT Stop Time  1149    PT Time Calculation (min)  28 min    Activity Tolerance  Patient tolerated treatment well    Behavior During Therapy  WFL for tasks assessed/performed       Past Medical History:  Diagnosis Date  . Arthritis   . Essential hypertension   . Gout   . History of stroke    Right occipital May 2018  . Hypertensive heart disease    LVH with grade 2 diastolic dysfunction  . Mixed hyperlipidemia    Statin intolerance  . Rotator cuff tear    Chronic pain  . Sleep apnea    a. intolerant to CPAP  . Tubular adenoma   . Type 2 diabetes mellitus ([place])     Past Surgical History:  Procedure Laterality Date  . APPENDECTOMY    . COLONOSCOPY N/A 10/29/2013   Dr. [name]- sigmoid polyp status post cold snare removal. large sessile ascending colon polyp. debulked with saline assisted snare polypectomy . pancolonic diverticulosis. inadequate prep. tubular adenoma on bx  . COLONOSCOPY N/A 02/22/2014   Dr. [name]: Saline-assisted snare polypectomy/biopsy for sprawling carpet polyp in the ascending colon which could not be completely removed. Status post biopsy  (tubular adenoma), tattooed  . COLONOSCOPY N/A 03/01/2014   JME:QAST polypectomy hemorrhage s/p bleeding  . COLONOSCOPY N/A 06/08/2015   Procedure: COLONOSCOPY;  Surgeon: [name], MD;  [facility];  Service: Endoscopy;  Laterality: N/A;  0900  . ESOPHAGOGASTRODUODENOSCOPY N/A 02/22/2014   Dr. [name]: Erosive reflux esophagitis. Schatzki ring status post dilation. Gastric and duodenal erosions with benign biopsies.  [name] LAPAROSCOPIC RIGHT COLECTOMY N/A 04/16/2014   Procedure: LAPAROSCOPIC ASSISTED RIGHT COLECTOMY;  Surgeon: [name], MD;  Location: [facility];  Service: General;  Laterality: N/A;  . [name] DILATION N/A 02/22/2014   Procedure: [name];  Surgeon: [name], MD;  [facility];  Service: Endoscopy;  Laterality: N/A;  . SAVORY DILATION N/A 02/22/2014   Procedure: SAVORY DILATION;  Surgeon: [name], MD;  [facility];  Service: Endoscopy;  Laterality: N/A;    There were no vitals filed for this visit.              Wound Therapy - 06/26/18 1152    Subjective  Patient denies pain upon arrival. He and his wife have to go to the hospital for an appointment for his wife after this treatment sessoin.    Patient and Family Stated Goals  burns to heal.     Date of Onset  05/12/18    Prior Treatments  ER, Self care, MD     Pain Scale  0-10    Pain Score  0-No pain    Evaluation and Treatment Procedures Explained to Patient/Family  Yes    Evaluation and Treatment Procedures  agreed to    Wound Properties Date First Assessed: 05/26/18 Time First Assessed: 0820 Wound Type: Burn Location: Toe (Comment  which one) Location Orientation: Left Wound Description (Comments): 1st   Dressing Type  Gauze (Comment)    Dressing Changed  Changed    Dressing Status  Old drainage    Dressing Change Frequency  PRN    Site / Wound Assessment  Yellow;Pink    % Wound base Red or Granulating  0%    % Wound base Yellow/Fibrinous Exudate  10%    after debridement prior = 30%;  initally 100% black eschar.    % Wound base Black/Eschar  0%    % Wound base Other/Granulation Tissue (Comment)  90%   after debridement   Peri-wound Assessment  Intact;Maceration   pale tissue   Wound Length (cm)  2.3 cm   initally 3.4    Wound Width (cm)  1.7 cm   initally 3.0   Wound Depth (cm)  0.2 cm   was black eschar unknown depth.    Wound Volume (cm^3)  0.78 cm^3    Wound Surface Area (cm^2)  3.91 cm^2    Margins  Epibole (rolled edges)    Drainage Amount  Minimal    Treatment  Cleansed;Debridement (Selective)    Wound Properties Date First Assessed: 05/26/18 Time First Assessed: 0840 Wound Type: Burn Location: Toe (Comment  which one) Location Orientation: Left Wound Description (Comments): 3rd Present on Admission: Yes Final Assessment Date: 06/26/18 Final Assessment Time: 1156   Dressing Type  Gauze (Comment)    Dressing Status  Old drainage    Dressing Change Frequency  PRN    Site / Wound Assessment  Red    % Wound base Red or Granulating  100%   initally 0%    Peri-wound Assessment  Intact    Wound Length (cm)  0.3 cm   initally 1.4   Wound Width (cm)  0.1 cm   initally 1.0    Wound Surface Area (cm^2)  0.03 cm^2    Drainage Amount  None    Selective Debridement - Location  Great toe    Selective Debridement - Tools Used  Forceps;Scissors    Selective Debridement - Tissue Removed  slough, devitalized tissue, slough, and epiboled edges    Wound Therapy - Clinical Statement  Therapist discontinued treatment of 3rd toe as it no longer needs skilled treatment.  PT burn on plantar aspect of second toe is improving in granulation but noted maceration today so will change to xeroform dressing.  Noted epiboled edges which were almost completely debrided.  Pt will continue to benefit from skilled physical therapy to promote a healing environment for pt great toe.     Wound Therapy - Functional Problem List  diffficulty donning and doffing  shoes.     Factors Delaying/Impairing Wound Healing  Altered sensation;Diabetes Mellitus;Polypharmacy;Vascular compromise    Hydrotherapy Plan  Debridement;Dressing change;Patient/family education    Wound Therapy - Frequency  2X / week   Three times a week for one week then 2x a week for 7 weeks.    Wound Therapy - Current Recommendations  PT  Wound Plan  PT to be educated on prevention of wounds with DM, educated on self care, wounds to be debrided and dressed to maintain a healing enviornment.     Dressing   1st toe, xeroform  gauze.    Dressing   3rd toe bandaid                 PT Short Term Goals - 06/26/18 1204      PT SHORT TERM GOAL #1   Title  Pt to verbalize the importance of wearing shoes at all time.    Time  1    Period  Weeks    Status  Achieved      PT SHORT TERM GOAL #2   Title  PT Lt  second and third toes to be healed     Time  2    Period  Weeks    Status  Achieved        PT Long Term Goals - 06/26/18 1205      PT LONG TERM GOAL #1   Title  PT Lt great toe to have no eschar to prevent infection.     Time  4    Period  Weeks    Status  Achieved      PT LONG TERM GOAL #2   Title  PT LT great toe burn to have approximated to no greater than 2 x 2 to allow pt and wife to be confident in self caring until wound heals.     Time  4    Period  Weeks    Status  Partially Met            Plan - 06/26/18 1203    Clinical Impression Statement  as above     Rehab Potential  Good    PT Frequency  2x / week    PT Duration  8 weeks   additional 4 weeks for 8 weeks total    PT Treatment/Interventions  ADLs/Self Care Home Management   sharp debridement and dressing change.    PT Next Visit Plan  Continue with sharp debridement and dressing change to promote a healthy environment for wound healing.     Consulted and Agree with Plan of Care  Patient;Family member/caregiver       Patient will benefit from skilled therapeutic intervention in order  to improve the following deficits and impairments:  Decreased balance, Decreased skin integrity  Visit Diagnosis: Second degree burn of great toe of foot, left, sequela  Partial thickness burn of left third toe, subsequent encounter     Problem List Patient Active Problem List   Diagnosis Date Noted  . CVA (cerebral vascular accident) ([place]) 01/21/2017  . CKD (chronic kidney disease) 01/21/2017  . History of colonic polyps   . Diverticulosis of colon without hemorrhage   . Adenomatous colon polyp 04/16/2014  . Preoperative cardiovascular examination 03/24/2014  . Hypertensive heart disease 03/24/2014  . Personal history of colonic polyps 03/04/2014  . Rectal bleeding 03/01/2014  . Diverticulosis of colon 03/01/2014  . Gout 12/26/2012  . Obesity 12/20/2012  . Essential hypertension, benign 10/22/2011  . Hyperlipidemia 10/22/2011  . DM (diabetes mellitus) ([place]) 10/22/2011    Rayetta Humphrey, PT CLT [phone] 06/26/2018, 12:05 PM  [place] [address] [place], [name], [address] Phone: [phone]   Fax:  [phone]  Name: Benjamin [name] MRN: [id number] Date of Birth: 1951/09/02

## 2018-06-30 ENCOUNTER — Ambulatory Visit (HOSPITAL_COMMUNITY): Payer: Medicare HMO

## 2018-06-30 ENCOUNTER — Other Ambulatory Visit: Payer: Self-pay

## 2018-06-30 ENCOUNTER — Encounter (HOSPITAL_COMMUNITY): Payer: Self-pay

## 2018-06-30 DIAGNOSIS — R262 Difficulty in walking, not elsewhere classified: Secondary | ICD-10-CM | POA: Diagnosis not present

## 2018-06-30 DIAGNOSIS — T25332D Burn of third degree of left toe(s) (nail), subsequent encounter: Secondary | ICD-10-CM

## 2018-06-30 DIAGNOSIS — T25232S Burn of second degree of left toe(s) (nail), sequela: Secondary | ICD-10-CM

## 2018-06-30 DIAGNOSIS — T25232D Burn of second degree of left toe(s) (nail), subsequent encounter: Secondary | ICD-10-CM | POA: Diagnosis not present

## 2018-06-30 NOTE — Therapy (Signed)
[name], [name], [address] Phone: [phone]   Fax:  [phone]  Wound Care Therapy  Patient Details  Name: Benjamin [name] MRN: [id number] Date of Birth: Apr 02, 1951 Referring Provider (PT): [name]   Encounter Date: 06/30/2018  PT End of Session - 06/30/18 1217    Visit Number  11    Number of Visits  18    Date for PT Re-Evaluation  06/25/18    Authorization Type  [employer] (no visit limit, no auth) patient is working on getting VA coverage    Authorization Time Period  cert runs out on 90/10/4095    Authorization - Visit Number  11    Authorization - Number of Visits  18    PT Start Time  1128    PT Stop Time  1203    PT Time Calculation (min)  35 min    Activity Tolerance  Patient tolerated treatment well    Behavior During Therapy  [facility] for tasks assessed/performed       Past Medical History:  Diagnosis Date  . Arthritis   . Essential hypertension   . Gout   . History of stroke    Right occipital May 2018  . Hypertensive heart disease    LVH with grade 2 diastolic dysfunction  . Mixed hyperlipidemia    Statin intolerance  . Rotator cuff tear    Chronic pain  . Sleep apnea    a. intolerant to CPAP  . Tubular adenoma   . Type 2 diabetes mellitus ([place])     Past Surgical History:  Procedure Laterality Date  . APPENDECTOMY    . COLONOSCOPY N/A 10/29/2013   Dr. [name]- sigmoid polyp status post cold snare removal. large sessile ascending colon polyp. debulked with saline assisted snare polypectomy . pancolonic diverticulosis. inadequate prep. tubular adenoma on bx  . COLONOSCOPY N/A 02/22/2014   Dr. [name]: Saline-assisted snare polypectomy/biopsy for sprawling carpet polyp in the ascending colon which could not be completely removed. Status post biopsy (tubular adenoma), tattooed  . COLONOSCOPY N/A 03/01/2014   DZH:GDJM polypectomy hemorrhage s/p bleeding  . COLONOSCOPY N/A 06/08/2015   Procedure: COLONOSCOPY;  Surgeon: [name], MD;  [facility];  Service: Endoscopy;  Laterality: N/A;  0900  . ESOPHAGOGASTRODUODENOSCOPY N/A 02/22/2014   Dr. [name]: Erosive reflux esophagitis. Schatzki ring status post dilation. Gastric and duodenal erosions with benign biopsies.  [name] LAPAROSCOPIC RIGHT COLECTOMY N/A 04/16/2014   Procedure: LAPAROSCOPIC ASSISTED RIGHT COLECTOMY;  Surgeon: [name], MD;  Location: [place];  Service: General;  Laterality: N/A;  . [name] DILATION N/A 02/22/2014   Procedure: [name];  Surgeon: [name], MD;  [facility];  Service: Endoscopy;  Laterality: N/A;  . SAVORY DILATION N/A 02/22/2014   Procedure: SAVORY DILATION;  Surgeon: [name], MD;  [facility];  Service: Endoscopy;  Laterality: N/A;    There were no vitals filed for this visit.   Wound Therapy - 06/30/18 1212    Subjective  Patient reports toe was mroe tender after last session but denies pain today. He states he has been working in the yard all morning.     Patient and Family Stated Goals  burns to heal.     Date of Onset  05/12/18    Prior Treatments  ER, Self care, MD     Pain Scale  0-10    Pain Score  0-No pain    Evaluation and Treatment Procedures Explained to Patient/Family  Yes    Evaluation and Treatment Procedures  agreed to    Wound Properties Date First Assessed: 05/26/18 Time First Assessed: 0820 Wound Type: Burn Location: Toe (Comment  which one) Location Orientation: Left Wound Description (Comments): 1st   Dressing Type  Gauze (Comment)    Dressing Changed  Changed    Dressing Status  Old drainage    Dressing Change Frequency  PRN    Site / Wound Assessment  Yellow;Pink    % Wound base Red or Granulating  0%    % Wound base Yellow/Fibrinous Exudate  10%   after debridement prior = 30%;  initally 100% black eschar.    % Wound base Black/Eschar  0%    % Wound base Other/Granulation Tissue (Comment)  90%   after  debridement   Peri-wound Assessment  Intact;Maceration   pale tissue   Margins  Epibole (rolled edges)    Drainage Amount  Minimal    Treatment  Cleansed;Debridement (Selective)    Selective Debridement - Location  Great toe    Selective Debridement - Tools Used  Forceps;Scalpel    Selective Debridement - Tissue Removed  slough, devitalized tissue, slough, and epiboled edges    Wound Therapy - Clinical Statement  No maceration noted today of great toe periwound. Wound bed is improving with granulation and slough is more easily removed. Patient had improved attached edges at inferior wound margins but continues to have epiboled edges around superior boarder and sharps debridement was continued to open the margins and promote approximation. Continued with xeroform. He will continue to benefit from skilled physical therapy to promote a healing environment for pt great toe.    Wound Therapy - Functional Problem List  diffficulty donning and doffing shoes.     Factors Delaying/Impairing Wound Healing  Altered sensation;Diabetes Mellitus;Polypharmacy;Vascular compromise    Hydrotherapy Plan  Debridement;Dressing change;Patient/family education    Wound Therapy - Frequency  2X / week   Three times a week for one week then 2x a week for 7 weeks.    Wound Therapy - Current Recommendations  PT    Wound Plan  PT to be educated on prevention of wounds with DM, educated on self care, wounds to be debrided and dressed to maintain a healing enviornment.     Dressing   1st toe, xeroform  gauze.    Dressing  --        PT Short Term Goals - 06/26/18 1204      PT SHORT TERM GOAL #1   Title  Pt to verbalize the importance of wearing shoes at all time.    Time  1    Period  Weeks    Status  Achieved      PT SHORT TERM GOAL #2   Title  PT Lt  second and third toes to be healed     Time  2    Period  Weeks    Status  Achieved        PT Long Term Goals - 06/26/18 1205      PT LONG TERM GOAL #1    Title  PT Lt great toe to have no eschar to prevent infection.     Time  4    Period  Weeks    Status  Achieved      PT LONG TERM GOAL #2   Title  PT LT great toe burn to have  approximated to no greater than 2 x 2 to allow pt and wife to be confident in self caring until wound heals.     Time  4    Period  Weeks    Status  Partially Met        Plan - 06/30/18 1218    Clinical Impression Statement  see above    Rehab Potential  Good    PT Frequency  2x / week    PT Duration  8 weeks   additional 4 weeks for 8 weeks total    PT Treatment/Interventions  ADLs/Self Care Home Management   sharp debridement and dressing change.    PT Next Visit Plan  Continue with sharp debridement and dressing change to promote a healthy environment for wound healing.     Consulted and Agree with Plan of Care  Patient;Family member/caregiver       Patient will benefit from skilled therapeutic intervention in order to improve the following deficits and impairments:  Decreased balance, Decreased skin integrity  Visit Diagnosis: Second degree burn of great toe of foot, left, sequela  Partial thickness burn of left third toe, subsequent encounter  Difficulty in walking, not elsewhere classified  Deep full thickness burn of toe of left foot, subsequent encounter  Partial thickness burn of left second toe, subsequent encounter     Problem List Patient Active Problem List   Diagnosis Date Noted  . CVA (cerebral vascular accident) ([place]) 01/21/2017  . CKD (chronic kidney disease) 01/21/2017  . History of colonic polyps   . Diverticulosis of colon without hemorrhage   . Adenomatous colon polyp 04/16/2014  . Preoperative cardiovascular examination 03/24/2014  . Hypertensive heart disease 03/24/2014  . Personal history of colonic polyps 03/04/2014  . Rectal bleeding 03/01/2014  . Diverticulosis of colon 03/01/2014  . Gout 12/26/2012  . Obesity 12/20/2012  . Essential hypertension, benign  10/22/2011  . Hyperlipidemia 10/22/2011  . DM (diabetes mellitus) ([place]) 10/22/2011    [name], PT, DPT Physical Therapist with [facility]  06/30/2018 12:19 PM    [place] [address] [place], [name], [address] Phone: [phone]   Fax:  [phone]  Name: Benjamin [name] MRN: [id number] Date of Birth: 03-Jul-1951

## 2018-07-03 ENCOUNTER — Telehealth (HOSPITAL_COMMUNITY): Payer: Self-pay | Admitting: Physical Therapy

## 2018-07-03 ENCOUNTER — Ambulatory Visit (HOSPITAL_COMMUNITY): Payer: Medicare HMO | Admitting: Physical Therapy

## 2018-07-03 NOTE — Telephone Encounter (Signed)
Patient called to say he is unable to come today.

## 2018-07-07 ENCOUNTER — Other Ambulatory Visit: Payer: Self-pay

## 2018-07-07 ENCOUNTER — Ambulatory Visit (HOSPITAL_COMMUNITY): Payer: Medicare HMO

## 2018-07-07 ENCOUNTER — Encounter (HOSPITAL_COMMUNITY): Payer: Self-pay

## 2018-07-07 DIAGNOSIS — T25232D Burn of second degree of left toe(s) (nail), subsequent encounter: Secondary | ICD-10-CM | POA: Diagnosis not present

## 2018-07-07 DIAGNOSIS — T25232S Burn of second degree of left toe(s) (nail), sequela: Secondary | ICD-10-CM | POA: Diagnosis not present

## 2018-07-07 DIAGNOSIS — T25332D Burn of third degree of left toe(s) (nail), subsequent encounter: Secondary | ICD-10-CM | POA: Diagnosis not present

## 2018-07-07 DIAGNOSIS — R262 Difficulty in walking, not elsewhere classified: Secondary | ICD-10-CM | POA: Diagnosis not present

## 2018-07-07 NOTE — Therapy (Signed)
[place], [name], [address] Phone: [phone]   Fax:  [phone]  Wound Care Therapy  Patient Details  Name: Benjamin [name] MRN: [id number] Date of Birth: [birth year] Referring Provider (PT): [name]   Encounter Date: 07/07/2018  PT End of Session - 07/07/18 0856    Visit Number  12    Number of Visits  18    Date for PT Re-Evaluation  06/25/18    Authorization Type  [employer] (no visit limit, no auth) patient is working on getting VA coverage    Authorization Time Period  cert runs out on 17/12/9447    Authorization - Visit Number  12    Authorization - Number of Visits  18    PT Start Time  0815    PT Stop Time  0845    PT Time Calculation (min)  30 min    Activity Tolerance  Patient tolerated treatment well    Behavior During Therapy  [facility] for tasks assessed/performed       Past Medical History:  Diagnosis Date  . Arthritis   . Essential hypertension   . Gout   . History of stroke    Right occipital May 2018  . Hypertensive heart disease    LVH with grade 2 diastolic dysfunction  . Mixed hyperlipidemia    Statin intolerance  . Rotator cuff tear    Chronic pain  . Sleep apnea    a. intolerant to CPAP  . Tubular adenoma   . Type 2 diabetes mellitus ([place])     Past Surgical History:  Procedure Laterality Date  . APPENDECTOMY    . COLONOSCOPY N/A 10/29/2013   Dr. [name]- sigmoid polyp status post cold snare removal. large sessile ascending colon polyp. debulked with saline assisted snare polypectomy . pancolonic diverticulosis. inadequate prep. tubular adenoma on bx  . COLONOSCOPY N/A 02/22/2014   Dr. [name]: Saline-assisted snare polypectomy/biopsy for sprawling carpet polyp in the ascending colon which could not be completely removed. Status post biopsy (tubular adenoma), tattooed  . COLONOSCOPY N/A 03/01/2014   QPR:FFMB polypectomy hemorrhage s/p bleeding  . COLONOSCOPY N/A 06/08/2015   Procedure: COLONOSCOPY;  Surgeon: [name], MD;  [facility];  Service: Endoscopy;  Laterality: N/A;  0900  . ESOPHAGOGASTRODUODENOSCOPY N/A 02/22/2014   Dr. [name]: Erosive reflux esophagitis. Schatzki ring status post dilation. Gastric and duodenal erosions with benign biopsies.  [name] LAPAROSCOPIC RIGHT COLECTOMY N/A 04/16/2014   Procedure: LAPAROSCOPIC ASSISTED RIGHT COLECTOMY;  Surgeon: [name], MD;  Location: [place];  Service: General;  Laterality: N/A;  . [name] DILATION N/A 02/22/2014   Procedure: [name];  Surgeon: [name], MD;  [facility];  Service: Endoscopy;  Laterality: N/A;  . SAVORY DILATION N/A 02/22/2014   Procedure: SAVORY DILATION;  Surgeon: [name], MD;  [facility];  Service: Endoscopy;  Laterality: N/A;    There were no vitals filed for this visit.     Wound Therapy - 07/07/18 0850    Subjective  Patient reports toe was mroe tender after last session but denies pain today. He states he has been working in the yard all morning.     Patient and Family Stated Goals  burns to heal.     Date of Onset  05/12/18    Prior Treatments  ER, Self care, MD     Evaluation and Treatment Procedures Explained to Patient/Family  Yes    Evaluation and Treatment Procedures  agreed to    Wound Properties Date First Assessed: 05/26/18 Time First Assessed: 0820 Wound Type: Burn Location: Toe (Comment  which one) Location Orientation: Left Wound Description (Comments): 1st   Dressing Type  Gauze (Comment)    Dressing Changed  Changed    Dressing Status  Old drainage    Dressing Change Frequency  PRN    Site / Wound Assessment  Yellow;Pink    % Wound base Red or Granulating  0%    % Wound base Yellow/Fibrinous Exudate  5%    % Wound base Black/Eschar  0%    % Wound base Other/Granulation Tissue (Comment)  95%   after debridement   Peri-wound Assessment  Intact   pale tissue   Wound Length (cm)  2.2 cm    Wound Width (cm)   1.6 cm    Wound Depth (cm)  0.2 cm    Wound Volume (cm^3)  0.7 cm^3    Wound Surface Area (cm^2)  3.52 cm^2    Margins  Epibole (rolled edges)    Drainage Amount  Minimal    Treatment  Cleansed;Debridement (Selective)    Selective Debridement - Location  Great toe    Selective Debridement - Tools Used  Forceps;Scalpel    Selective Debridement - Tissue Removed  slough, devitalized tissue, slough, and epiboled edges    Wound Therapy - Clinical Statement  Wound continues to approximate on great toe and granulation tissue was 95% following cleansing/debridement today. Wound margins on great toe continue to requires debridement for epiboled edges, especially at the superior boarder, edges around inferior boarder are more attached this date. Continued with xeroform as wound is improving with this agent. He will continue to benefit from skilled physical therapy to promote a healing environment for pt great toe.    Wound Therapy - Functional Problem List  diffficulty donning and doffing shoes.     Factors Delaying/Impairing Wound Healing  Altered sensation;Diabetes Mellitus;Polypharmacy;Vascular compromise    Hydrotherapy Plan  Debridement;Dressing change;Patient/family education    Wound Therapy - Frequency  2X / week   Three times a week for one week then 2x a week for 7 weeks.    Wound Therapy - Current Recommendations  PT    Wound Plan  PT to be educated on prevention of wounds with DM, educated on self care, wounds to be debrided and dressed to maintain a healing enviornment.     Dressing   1st toe, xeroform  gauze.         PT Short Term Goals - 06/26/18 1204      PT SHORT TERM GOAL #1   Title  Pt to verbalize the importance of wearing shoes at all time.    Time  1    Period  Weeks    Status  Achieved      PT SHORT TERM GOAL #2   Title  PT Lt  second and third toes to be healed     Time  2    Period  Weeks    Status  Achieved        PT Long Term Goals - 06/26/18 1205      PT  LONG TERM GOAL #1   Title  PT Lt great toe to have no eschar to prevent infection.     Time  4    Period  Weeks    Status  Achieved      PT LONG  TERM GOAL #2   Title  PT LT great toe burn to have approximated to no greater than 2 x 2 to allow pt and wife to be confident in self caring until wound heals.     Time  4    Period  Weeks    Status  Partially Met        Plan - 07/07/18 0856    Clinical Impression Statement  see above    Rehab Potential  Good    PT Frequency  2x / week    PT Duration  8 weeks   additional 4 weeks for 8 weeks total    PT Treatment/Interventions  ADLs/Self Care Home Management   sharp debridement and dressing change.    PT Next Visit Plan  Continue with sharp debridement and dressing change to promote a healthy environment for wound healing.     Consulted and Agree with Plan of Care  Patient;Family member/caregiver       Patient will benefit from skilled therapeutic intervention in order to improve the following deficits and impairments:  Decreased balance, Decreased skin integrity  Visit Diagnosis: Second degree burn of great toe of foot, left, sequela  Partial thickness burn of left third toe, subsequent encounter  Difficulty in walking, not elsewhere classified  Deep full thickness burn of toe of left foot, subsequent encounter  Partial thickness burn of left second toe, subsequent encounter     Problem List Patient Active Problem List   Diagnosis Date Noted  . CVA (cerebral vascular accident) ([place]) 01/21/2017  . CKD (chronic kidney disease) 01/21/2017  . History of colonic polyps   . Diverticulosis of colon without hemorrhage   . Adenomatous colon polyp 04/16/2014  . Preoperative cardiovascular examination 03/24/2014  . Hypertensive heart disease 03/24/2014  . Personal history of colonic polyps 03/04/2014  . Rectal bleeding 03/01/2014  . Diverticulosis of colon 03/01/2014  . Gout 12/26/2012  . Obesity 12/20/2012  . Essential  hypertension, benign 10/22/2011  . Hyperlipidemia 10/22/2011  . DM (diabetes mellitus) ([place]) 10/22/2011    [name], PT, DPT Physical Therapist with [facility]  07/07/2018 8:57 AM    [place], [name], [address] Phone: [phone]   Fax:  [phone]  Name: Benjamin [name] MRN: [id number] Date of Birth: [birth year]

## 2018-07-09 ENCOUNTER — Ambulatory Visit (HOSPITAL_COMMUNITY): Payer: Medicare HMO | Admitting: Physical Therapy

## 2018-07-09 ENCOUNTER — Telehealth (HOSPITAL_COMMUNITY): Payer: Self-pay | Admitting: Physical Therapy

## 2018-07-09 ENCOUNTER — Ambulatory Visit (HOSPITAL_COMMUNITY): Payer: Non-veteran care

## 2018-07-09 NOTE — Telephone Encounter (Signed)
Called pt re missed appointment.  PT states that we were suppose to be trying to find as late as possible appointment tomorrow for the pt as he has gone back to work and is working in [place].  PT told pt we would call him back re appointment tomorrow or not.   Benjamin [name], [place] CLT [phone]

## 2018-07-10 ENCOUNTER — Ambulatory Visit (HOSPITAL_COMMUNITY): Payer: Medicare HMO

## 2018-07-10 ENCOUNTER — Ambulatory Visit (HOSPITAL_COMMUNITY): Payer: Non-veteran care

## 2018-07-10 ENCOUNTER — Encounter (HOSPITAL_COMMUNITY): Payer: Self-pay

## 2018-07-10 ENCOUNTER — Other Ambulatory Visit: Payer: Self-pay

## 2018-07-10 DIAGNOSIS — T25232S Burn of second degree of left toe(s) (nail), sequela: Secondary | ICD-10-CM

## 2018-07-10 DIAGNOSIS — R262 Difficulty in walking, not elsewhere classified: Secondary | ICD-10-CM | POA: Diagnosis not present

## 2018-07-10 DIAGNOSIS — T25232D Burn of second degree of left toe(s) (nail), subsequent encounter: Secondary | ICD-10-CM | POA: Diagnosis not present

## 2018-07-10 DIAGNOSIS — T25332D Burn of third degree of left toe(s) (nail), subsequent encounter: Secondary | ICD-10-CM | POA: Diagnosis not present

## 2018-07-10 NOTE — Therapy (Signed)
[place], [name], [address] Phone: [phone]   Fax:  [phone]  Wound Care Therapy  Patient Details  Name: Benjamin [name] MRN: [id number] Date of Birth: 1951/08/03 Referring Provider (PT): [name]   Encounter Date: 07/10/2018  PT End of Session - 07/10/18 1731    Visit Number  13    Number of Visits  18    Date for PT Re-Evaluation  06/25/18    Authorization Type  [employer] (no visit limit, no auth) patient is working on getting VA coverage    Authorization Time Period  cert runs out on 56/04/1274    Authorization - Visit Number  13    Authorization - Number of Visits  18    PT Start Time  1700    PT Stop Time  1720    PT Time Calculation (min)  39 min    Activity Tolerance  Patient tolerated treatment well    Behavior During Therapy  [place] for tasks assessed/performed       Past Medical History:  Diagnosis Date  . Arthritis   . Essential hypertension   . Gout   . History of stroke    Right occipital May 2018  . Hypertensive heart disease    LVH with grade 2 diastolic dysfunction  . Mixed hyperlipidemia    Statin intolerance  . Rotator cuff tear    Chronic pain  . Sleep apnea    a. intolerant to CPAP  . Tubular adenoma   . Type 2 diabetes mellitus ([place])     Past Surgical History:  Procedure Laterality Date  . APPENDECTOMY    . COLONOSCOPY N/A 10/29/2013   Dr. [name]- sigmoid polyp status post cold snare removal. large sessile ascending colon polyp. debulked with saline assisted snare polypectomy . pancolonic diverticulosis. inadequate prep. tubular adenoma on bx  . COLONOSCOPY N/A 02/22/2014   Dr. [name]: Saline-assisted snare polypectomy/biopsy for sprawling carpet polyp in the ascending colon which could not be completely removed. Status post biopsy (tubular adenoma), tattooed  . COLONOSCOPY N/A 03/01/2014   [facility]:BSWH polypectomy hemorrhage s/p bleeding  . COLONOSCOPY N/A 06/08/2015   Procedure: COLONOSCOPY;  Surgeon: [name], MD;  [facility];  Service: Endoscopy;  Laterality: N/A;  0900  . ESOPHAGOGASTRODUODENOSCOPY N/A 02/22/2014   Dr. [name]: Erosive reflux esophagitis. Schatzki ring status post dilation. Gastric and duodenal erosions with benign biopsies.  [name] LAPAROSCOPIC RIGHT COLECTOMY N/A 04/16/2014   Procedure: LAPAROSCOPIC ASSISTED RIGHT COLECTOMY;  Surgeon: [name], MD;  Location: [facility];  Service: General;  Laterality: N/A;  . [name] DILATION N/A 02/22/2014   Procedure: [name];  Surgeon: [name], MD;  [facility];  Service: Endoscopy;  Laterality: N/A;  . SAVORY DILATION N/A 02/22/2014   Procedure: SAVORY DILATION;  Surgeon: [name], MD;  [facility];  Service: Endoscopy;  Laterality: N/A;    There were no vitals filed for this visit.    Wound Therapy - 07/10/18 1725    Subjective  Patient arrives with dressing intact. He has returned to work this week and reports it would be easier to come to therapy 1x/week if able. He does not work on Friday and would like to move appointments to next Friday if there is an opening.     Patient and Family Stated Goals  burns to heal.     Date of Onset  05/12/18  Prior Treatments  ER, Self care, MD     Pain Scale  0-10    Pain Score  0-No pain    Evaluation and Treatment Procedures Explained to Patient/Family  Yes    Evaluation and Treatment Procedures  agreed to    Wound Properties Date First Assessed: 05/26/18 Time First Assessed: 0820 Wound Type: Burn Location: Toe (Comment  which one) Location Orientation: Left Wound Description (Comments): 1st   Dressing Type  Gauze (Comment)    Dressing Changed  Changed    Dressing Status  Old drainage    Dressing Change Frequency  PRN    Site / Wound Assessment  Yellow;Pink    % Wound base Red or Granulating  0%    % Wound base Yellow/Fibrinous Exudate  5%    % Wound base Black/Eschar  0%    % Wound base  Other/Granulation Tissue (Comment)  95%   after debridement   Peri-wound Assessment  Intact   pale tissue   Margins  Epibole (rolled edges)    Drainage Amount  Minimal    Treatment  Cleansed;Debridement (Selective)    Selective Debridement - Location  Great toe    Selective Debridement - Tools Used  Forceps;Scalpel    Selective Debridement - Tissue Removed  slough, devitalized tissue, slough, and epiboled edges    Wound Therapy - Clinical Statement  Wound continues to be well granulated and slough easily removable. Margins require ongoing debridement for epiobled edges, but inferior boarder is now attached. Conitnued with xeroform as wound is healing well. He will benefit from continued skilled wound care to promote a healing environment for pt great toe. He was educated that we can trial 1x/week next week and schedule him for Fridays with his evaluating therapist. He will need to schedule additional appointments for further care.    Wound Therapy - Functional Problem List  diffficulty donning and doffing shoes.     Factors Delaying/Impairing Wound Healing  Altered sensation;Diabetes Mellitus;Polypharmacy;Vascular compromise    Hydrotherapy Plan  Debridement;Dressing change;Patient/family education    Wound Therapy - Frequency  2X / week   Three times a week for one week then 2x a week for 7 weeks.    Wound Therapy - Current Recommendations  PT    Wound Plan  PT to be educated on prevention of wounds with DM, educated on self care, wounds to be debrided and dressed to maintain a healing enviornment.     Dressing   1st toe, xeroform  gauze.         PT Short Term Goals - 06/26/18 1204      PT SHORT TERM GOAL #1   Title  Pt to verbalize the importance of wearing shoes at all time.    Time  1    Period  Weeks    Status  Achieved      PT SHORT TERM GOAL #2   Title  PT Lt  second and third toes to be healed     Time  2    Period  Weeks    Status  Achieved        PT Long Term Goals  - 06/26/18 1205      PT LONG TERM GOAL #1   Title  PT Lt great toe to have no eschar to prevent infection.     Time  4    Period  Weeks    Status  Achieved      PT LONG TERM GOAL #2  Title  PT LT great toe burn to have approximated to no greater than 2 x 2 to allow pt and wife to be confident in self caring until wound heals.     Time  4    Period  Weeks    Status  Partially Met         Plan - 07/10/18 1731    Clinical Impression Statement  see above    Rehab Potential  Good    PT Frequency  2x / week    PT Duration  8 weeks   additional 4 weeks for 8 weeks total    PT Treatment/Interventions  ADLs/Self Care Home Management   sharp debridement and dressing change.    PT Next Visit Plan  Continue with sharp debridement and dressing change to promote a healthy environment for wound healing.     Consulted and Agree with Plan of Care  Patient;Family member/caregiver       Patient will benefit from skilled therapeutic intervention in order to improve the following deficits and impairments:  Decreased balance, Decreased skin integrity  Visit Diagnosis: Second degree burn of great toe of foot, left, sequela  Partial thickness burn of left third toe, subsequent encounter  Difficulty in walking, not elsewhere classified  Deep full thickness burn of toe of left foot, subsequent encounter  Partial thickness burn of left second toe, subsequent encounter     Problem List Patient Active Problem List   Diagnosis Date Noted  . CVA (cerebral vascular accident) ([place]) 01/21/2017  . CKD (chronic kidney disease) 01/21/2017  . History of colonic polyps   . Diverticulosis of colon without hemorrhage   . Adenomatous colon polyp 04/16/2014  . Preoperative cardiovascular examination 03/24/2014  . Hypertensive heart disease 03/24/2014  . Personal history of colonic polyps 03/04/2014  . Rectal bleeding 03/01/2014  . Diverticulosis of colon 03/01/2014  . Gout 12/26/2012  . Obesity  12/20/2012  . Essential hypertension, benign 10/22/2011  . Hyperlipidemia 10/22/2011  . DM (diabetes mellitus) ([place]) 10/22/2011    [name], PT, DPT Physical Therapist with [facility]  07/10/2018 5:32 PM    [facility] [address] [place], [name], [address] Phone: [phone]   Fax:  [phone]  Name: Benjamin [name] MRN: [id number] Date of Birth: [birth year]

## 2018-07-14 ENCOUNTER — Ambulatory Visit (HOSPITAL_COMMUNITY): Payer: Non-veteran care | Admitting: Physical Therapy

## 2018-07-15 ENCOUNTER — Ambulatory Visit (HOSPITAL_COMMUNITY): Payer: Medicare HMO

## 2018-07-15 ENCOUNTER — Encounter (HOSPITAL_COMMUNITY): Payer: Self-pay

## 2018-07-15 ENCOUNTER — Other Ambulatory Visit: Payer: Self-pay

## 2018-07-15 ENCOUNTER — Ambulatory Visit (HOSPITAL_COMMUNITY): Payer: Non-veteran care

## 2018-07-15 DIAGNOSIS — R262 Difficulty in walking, not elsewhere classified: Secondary | ICD-10-CM

## 2018-07-15 DIAGNOSIS — T25332D Burn of third degree of left toe(s) (nail), subsequent encounter: Secondary | ICD-10-CM | POA: Diagnosis not present

## 2018-07-15 DIAGNOSIS — T25232D Burn of second degree of left toe(s) (nail), subsequent encounter: Secondary | ICD-10-CM

## 2018-07-15 DIAGNOSIS — T25232S Burn of second degree of left toe(s) (nail), sequela: Secondary | ICD-10-CM

## 2018-07-15 NOTE — Therapy (Signed)
[place], [name], [address] Phone: [phone]   Fax:  [phone]  Wound Care Therapy  Patient Details  Name: Benjamin [name] MRN: [id number] Date of Birth: [birth year] Referring Provider (PT): [name]   Encounter Date: 07/15/2018  PT End of Session - 07/15/18 1740    Visit Number  14    Number of Visits  18    Date for PT Re-Evaluation  06/25/18    Authorization Type  [employer] (no visit limit, no auth) patient is working on getting VA coverage    Authorization Time Period  cert runs out on 37/09/6965    Authorization - Visit Number  14    Authorization - Number of Visits  18    PT Start Time  8938    PT Stop Time  1726    PT Time Calculation (min)  45 min    Activity Tolerance  Patient tolerated treatment well    Behavior During Therapy  [facility] for tasks assessed/performed       Past Medical History:  Diagnosis Date  . Arthritis   . Essential hypertension   . Gout   . History of stroke    Right occipital May 2018  . Hypertensive heart disease    LVH with grade 2 diastolic dysfunction  . Mixed hyperlipidemia    Statin intolerance  . Rotator cuff tear    Chronic pain  . Sleep apnea    a. intolerant to CPAP  . Tubular adenoma   . Type 2 diabetes mellitus ([place])     Past Surgical History:  Procedure Laterality Date  . APPENDECTOMY    . COLONOSCOPY N/A 10/29/2013   Dr. [name]- sigmoid polyp status post cold snare removal. large sessile ascending colon polyp. debulked with saline assisted snare polypectomy . pancolonic diverticulosis. inadequate prep. tubular adenoma on bx  . COLONOSCOPY N/A 02/22/2014   Dr. [name]: Saline-assisted snare polypectomy/biopsy for sprawling carpet polyp in the ascending colon which could not be completely removed. Status post biopsy (tubular adenoma), tattooed  . COLONOSCOPY N/A 03/01/2014   [facility]:BPZW polypectomy hemorrhage s/p bleeding  . COLONOSCOPY N/A 06/08/2015   Procedure: COLONOSCOPY;  Surgeon: [name], MD;  [facility];  Service: Endoscopy;  Laterality: N/A;  0900  . ESOPHAGOGASTRODUODENOSCOPY N/A 02/22/2014   Dr. [name]: Erosive reflux esophagitis. Schatzki ring status post dilation. Gastric and duodenal erosions with benign biopsies.  [name] LAPAROSCOPIC RIGHT COLECTOMY N/A 04/16/2014   Procedure: LAPAROSCOPIC ASSISTED RIGHT COLECTOMY;  Surgeon: [name], MD;  Location: [facility];  Service: General;  Laterality: N/A;  . [name] DILATION N/A 02/22/2014   Procedure: [name];  Surgeon: [name], MD;  [facility];  Service: Endoscopy;  Laterality: N/A;  . SAVORY DILATION N/A 02/22/2014   Procedure: SAVORY DILATION;  Surgeon: [name], MD;  [facility];  Service: Endoscopy;  Laterality: N/A;    There were no vitals filed for this visit.      Wound Therapy - 07/15/18 1741    Subjective  Patient arrives with dressing intact. He is glad he can come 1x/week for treatment now.     Patient and Family Stated Goals  burns to heal.     Date of Onset  05/12/18    Prior Treatments  ER, Self care, MD     Pain Scale  0-10    Pain Score  0-No pain    Evaluation  and Treatment Procedures Explained to Patient/Family  Yes    Evaluation and Treatment Procedures  agreed to    Wound Properties Date First Assessed: 05/26/18 Time First Assessed: 0820 Wound Type: Burn Location: Toe (Comment  which one) Location Orientation: Left Wound Description (Comments): 1st   Dressing Type  Gauze (Comment)    Dressing Changed  Changed    Dressing Status  Old drainage    Dressing Change Frequency  PRN    Site / Wound Assessment  Yellow;Pink    % Wound base Red or Granulating  0%    % Wound base Yellow/Fibrinous Exudate  5%    % Wound base Black/Eschar  0%    % Wound base Other/Granulation Tissue (Comment)  95%   after debridement   Peri-wound Assessment  Intact   pale tissue   Wound Length (cm)  2.1 cm    Wound Width  (cm)  1.2 cm    Wound Depth (cm)  0.2 cm    Wound Volume (cm^3)  0.5 cm^3    Wound Surface Area (cm^2)  2.52 cm^2    Margins  Epibole (rolled edges)    Drainage Amount  Minimal    Drainage Description  Serosanguineous    Treatment  Cleansed;Debridement (Selective)   bladed with scapel followed by dressing.   Selective Debridement - Location  Great toe    Selective Debridement - Tools Used  Forceps;Scalpel;Scissors    Selective Debridement - Tissue Removed  slough, devitalized tissue, slough, and epiboled edges    Wound Therapy - Clinical Statement  Wound is reducing slightly in width however length and not made substantial decrease in size from 1 week ago. Wound is well granulated and inferior margin appears to be approximating more. Superior margin remains epiboled and margin was debrided and bladed to promote edge approximation for improve wound closure. Continued with xeroform and gauze wrap to maintain moist healing environment. He will continue to benefit from skilled PT interventions to address impairments and promote healing.    Wound Therapy - Functional Problem List  diffficulty donning and doffing shoes.     Factors Delaying/Impairing Wound Healing  Altered sensation;Diabetes Mellitus;Polypharmacy;Vascular compromise    Hydrotherapy Plan  Debridement;Dressing change;Patient/family education    Wound Therapy - Frequency  2X / week   Three times a week for one week then 2x a week for 7 weeks.    Wound Therapy - Current Recommendations  PT    Wound Plan  PT to be educated on prevention of wounds with DM, educated on self care, wounds to be debrided and dressed to maintain a healing enviornment.     Dressing   1st toe, xeroform  gauze.         PT Short Term Goals - 06/26/18 1204      PT SHORT TERM GOAL #1   Title  Pt to verbalize the importance of wearing shoes at all time.    Time  1    Period  Weeks    Status  Achieved      PT SHORT TERM GOAL #2   Title  PT Lt  second and  third toes to be healed     Time  2    Period  Weeks    Status  Achieved        PT Long Term Goals - 06/26/18 1205      PT LONG TERM GOAL #1   Title  PT Lt great toe to have no eschar to prevent  infection.     Time  4    Period  Weeks    Status  Achieved      PT LONG TERM GOAL #2   Title  PT LT great toe burn to have approximated to no greater than 2 x 2 to allow pt and wife to be confident in self caring until wound heals.     Time  4    Period  Weeks    Status  Partially Met         Plan - 07/15/18 1741    Clinical Impression Statement  see above    Rehab Potential  Good    PT Frequency  2x / week    PT Duration  8 weeks   additional 4 weeks for 8 weeks total    PT Treatment/Interventions  ADLs/Self Care Home Management   sharp debridement and dressing change.    PT Next Visit Plan  Continue with sharp debridement and dressing change to promote a healthy environment for wound healing.     Consulted and Agree with Plan of Care  Patient;Family member/caregiver       Patient will benefit from skilled therapeutic intervention in order to improve the following deficits and impairments:  Decreased balance, Decreased skin integrity  Visit Diagnosis: Second degree burn of great toe of foot, left, sequela  Partial thickness burn of left third toe, subsequent encounter  Difficulty in walking, not elsewhere classified  Deep full thickness burn of toe of left foot, subsequent encounter  Partial thickness burn of left second toe, subsequent encounter     Problem List Patient Active Problem List   Diagnosis Date Noted  . CVA (cerebral vascular accident) ([place]) 01/21/2017  . CKD (chronic kidney disease) 01/21/2017  . History of colonic polyps   . Diverticulosis of colon without hemorrhage   . Adenomatous colon polyp 04/16/2014  . Preoperative cardiovascular examination 03/24/2014  . Hypertensive heart disease 03/24/2014  . Personal history of colonic polyps  03/04/2014  . Rectal bleeding 03/01/2014  . Diverticulosis of colon 03/01/2014  . Gout 12/26/2012  . Obesity 12/20/2012  . Essential hypertension, benign 10/22/2011  . Hyperlipidemia 10/22/2011  . DM (diabetes mellitus) ([place]) 10/22/2011    [name], PT, DPT Physical Therapist with [facility]  07/15/2018 5:55 PM    [facility] [address] [place], [name], [address] Phone: [phone]   Fax:  [phone]  Name: Benjamin [name] MRN: [id number] Date of Birth: [birth year]

## 2018-07-17 ENCOUNTER — Ambulatory Visit (HOSPITAL_COMMUNITY): Payer: Non-veteran care | Admitting: Physical Therapy

## 2018-07-18 ENCOUNTER — Ambulatory Visit: Payer: Medicare HMO | Admitting: Cardiology

## 2018-07-22 ENCOUNTER — Encounter (HOSPITAL_COMMUNITY): Payer: Self-pay

## 2018-07-22 ENCOUNTER — Other Ambulatory Visit: Payer: Self-pay

## 2018-07-22 ENCOUNTER — Ambulatory Visit (HOSPITAL_COMMUNITY): Payer: No Typology Code available for payment source | Attending: Physician Assistant

## 2018-07-22 DIAGNOSIS — T25232D Burn of second degree of left toe(s) (nail), subsequent encounter: Secondary | ICD-10-CM | POA: Insufficient documentation

## 2018-07-22 DIAGNOSIS — T25232S Burn of second degree of left toe(s) (nail), sequela: Secondary | ICD-10-CM | POA: Diagnosis not present

## 2018-07-22 DIAGNOSIS — T25332D Burn of third degree of left toe(s) (nail), subsequent encounter: Secondary | ICD-10-CM | POA: Insufficient documentation

## 2018-07-22 DIAGNOSIS — R262 Difficulty in walking, not elsewhere classified: Secondary | ICD-10-CM | POA: Diagnosis present

## 2018-07-22 NOTE — Therapy (Signed)
[place], [name], [address] Phone: [phone]   Fax:  [phone]  Wound Care Therapy  Patient Details  Name: Benjamin [name] MRN: [id number] Date of Birth: Apr 05, 1951 Referring Provider (PT): [name]   Encounter Date: 07/22/2018  PT End of Session - 07/22/18 1752    Visit Number  15    Number of Visits  18    Date for PT Re-Evaluation  06/25/18    Authorization Type  [employer] (no visit limit, no auth) patient is working on getting [facility] coverage    Authorization Time Period  cert runs out on 75/04/8324    Authorization - Visit Number  15    Authorization - Number of Visits  18    PT Start Time  4982    PT Stop Time  1720    PT Time Calculation (min)  30 min    Activity Tolerance  Patient tolerated treatment well    Behavior During Therapy  [facility] for tasks assessed/performed       Past Medical History:  Diagnosis Date  . Arthritis   . Essential hypertension   . Gout   . History of stroke    Right occipital May 2018  . Hypertensive heart disease    LVH with grade 2 diastolic dysfunction  . Mixed hyperlipidemia    Statin intolerance  . Rotator cuff tear    Chronic pain  . Sleep apnea    a. intolerant to CPAP  . Tubular adenoma   . Type 2 diabetes mellitus ([place])     Past Surgical History:  Procedure Laterality Date  . APPENDECTOMY    . COLONOSCOPY N/A 10/29/2013   Dr. [name]- sigmoid polyp status post cold snare removal. large sessile ascending colon polyp. debulked with saline assisted snare polypectomy . pancolonic diverticulosis. inadequate prep. tubular adenoma on bx  . COLONOSCOPY N/A 02/22/2014   Dr. [name]: Saline-assisted snare polypectomy/biopsy for sprawling carpet polyp in the ascending colon which could not be completely removed. Status post biopsy (tubular adenoma), tattooed  . COLONOSCOPY N/A 03/01/2014   MEB:RAXE polypectomy hemorrhage s/p bleeding  . COLONOSCOPY N/A 06/08/2015   Procedure: COLONOSCOPY;  Surgeon: [name], MD;  [facility];  Service: Endoscopy;  Laterality: N/A;  0900  . ESOPHAGOGASTRODUODENOSCOPY N/A 02/22/2014   Dr. [name]: Erosive reflux esophagitis. Schatzki ring status post dilation. Gastric and duodenal erosions with benign biopsies.  [name] LAPAROSCOPIC RIGHT COLECTOMY N/A 04/16/2014   Procedure: LAPAROSCOPIC ASSISTED RIGHT COLECTOMY;  Surgeon: [name], MD;  Location: [facility];  Service: General;  Laterality: N/A;  . [name] DILATION N/A 02/22/2014   Procedure: [name];  Surgeon: [name], MD;  [facility];  Service: Endoscopy;  Laterality: N/A;  . SAVORY DILATION N/A 02/22/2014   Procedure: SAVORY DILATION;  Surgeon: [name], MD;  [facility];  Service: Endoscopy;  Laterality: N/A;    There were no vitals filed for this visit.     Wound Therapy - 07/22/18 1742    Subjective  Patient reports he is doing well and has no pain. He reports he had a good weekend and went to the [place]yster fest for his fundraiser in his community.    Patient and Family Stated Goals  burns to heal.     Date of Onset  05/12/18    Prior Treatments  ER, Self care, MD     Evaluation and Treatment Procedures  Explained to Patient/Family  Yes    Evaluation and Treatment Procedures  agreed to    Wound Properties Date First Assessed: 05/26/18 Time First Assessed: 0820 Wound Type: Burn Location: Toe (Comment  which one) Location Orientation: Left Wound Description (Comments): 1st   Dressing Type  Gauze (Comment)    Dressing Changed  Changed    Dressing Status  Old drainage    Dressing Change Frequency  PRN    Site / Wound Assessment  Yellow;Pink    % Wound base Red or Granulating  0%    % Wound base Yellow/Fibrinous Exudate  5%    % Wound base Black/Eschar  0%    % Wound base Other/Granulation Tissue (Comment)  95%   after debridement   Peri-wound Assessment  Intact   pale tissue   Wound Length (cm)  1.6 cm   2.1    Wound Width (cm)  0.9 cm   0.9   Wound Depth (cm)  0.2 cm   0.2   Wound Volume (cm^3)  0.29 cm^3    Wound Surface Area (cm^2)  1.44 cm^2    Margins  Epibole (rolled edges)    Drainage Amount  Minimal    Drainage Description  Serosanguineous    Treatment  Cleansed;Debridement (Selective)    Selective Debridement - Location  Great toe    Selective Debridement - Tools Used  Forceps;Scalpel;Scissors    Selective Debridement - Tissue Removed  slough, devitalized tissue, slough, and epiboled edges    Wound Therapy - Clinical Statement  Significant reduction in wound size compared to last session with inferior wound margins approximating well. Superior margin is improving in attachment however some debridement at margins continued to address remaining epiboled edge. Wound continues to be well granulated with biofilm easily removed from wound bed. Continued with xeroform and gauze wrap to maintain moist healing environment. He will continue to benefit from skilled PT interventions to address impairments and promote healing.    Wound Therapy - Functional Problem List  diffficulty donning and doffing shoes.     Factors Delaying/Impairing Wound Healing  Altered sensation;Diabetes Mellitus;Polypharmacy;Vascular compromise    Hydrotherapy Plan  Debridement;Dressing change;Patient/family education    Wound Therapy - Frequency  2X / week   Three times a week for one week then 2x a week for 7 weeks.    Wound Therapy - Current Recommendations  PT    Wound Plan  PT to be educated on prevention of wounds with DM, educated on self care, wounds to be debrided and dressed to maintain a healing enviornment.     Dressing   1st toe, xeroform  gauze.        PT Short Term Goals - 06/26/18 1204      PT SHORT TERM GOAL #1   Title  Pt to verbalize the importance of wearing shoes at all time.    Time  1    Period  Weeks    Status  Achieved      PT SHORT TERM GOAL #2   Title  PT Lt  second and third toes to be  healed     Time  2    Period  Weeks    Status  Achieved        PT Long Term Goals - 06/26/18 1205      PT LONG TERM GOAL #1   Title  PT Lt great toe to have no eschar to prevent infection.     Time  4  Period  Weeks    Status  Achieved      PT LONG TERM GOAL #2   Title  PT LT great toe burn to have approximated to no greater than 2 x 2 to allow pt and wife to be confident in self caring until wound heals.     Time  4    Period  Weeks    Status  Partially Met        Plan - 07/22/18 1752    Clinical Impression Statement  see above    Rehab Potential  Good    PT Frequency  2x / week    PT Duration  8 weeks   additional 4 weeks for 8 weeks total    PT Treatment/Interventions  ADLs/Self Care Home Management   sharp debridement and dressing change.    PT Next Visit Plan  Continue with sharp debridement and dressing change to promote a healthy environment for wound healing.     Consulted and Agree with Plan of Care  Patient;Family member/caregiver       Patient will benefit from skilled therapeutic intervention in order to improve the following deficits and impairments:  Decreased balance, Decreased skin integrity  Visit Diagnosis: Second degree burn of great toe of foot, left, sequela  Partial thickness burn of left third toe, subsequent encounter  Difficulty in walking, not elsewhere classified  Deep full thickness burn of toe of left foot, subsequent encounter  Partial thickness burn of left second toe, subsequent encounter     Problem List Patient Active Problem List   Diagnosis Date Noted  . CVA (cerebral vascular accident) ([place]) 01/21/2017  . CKD (chronic kidney disease) 01/21/2017  . History of colonic polyps   . Diverticulosis of colon without hemorrhage   . Adenomatous colon polyp 04/16/2014  . Preoperative cardiovascular examination 03/24/2014  . Hypertensive heart disease 03/24/2014  . Personal history of colonic polyps 03/04/2014  . Rectal  bleeding 03/01/2014  . Diverticulosis of colon 03/01/2014  . Gout 12/26/2012  . Obesity 12/20/2012  . Essential hypertension, benign 10/22/2011  . Hyperlipidemia 10/22/2011  . DM (diabetes mellitus) ([place]) 10/22/2011    [name], PT, DPT Physical Therapist with [facility]  07/22/2018 5:53 PM    [place] [address] [place], [name], [address] Phone: [phone]   Fax:  [phone]  Name: Benjamin [name] MRN: [id number] Date of Birth: 1951-02-23

## 2018-07-29 ENCOUNTER — Other Ambulatory Visit: Payer: Self-pay

## 2018-07-29 ENCOUNTER — Ambulatory Visit (HOSPITAL_COMMUNITY): Payer: No Typology Code available for payment source

## 2018-07-29 ENCOUNTER — Encounter (HOSPITAL_COMMUNITY): Payer: Self-pay

## 2018-07-29 DIAGNOSIS — T25332D Burn of third degree of left toe(s) (nail), subsequent encounter: Secondary | ICD-10-CM

## 2018-07-29 DIAGNOSIS — T25232D Burn of second degree of left toe(s) (nail), subsequent encounter: Secondary | ICD-10-CM

## 2018-07-29 DIAGNOSIS — T25232S Burn of second degree of left toe(s) (nail), sequela: Secondary | ICD-10-CM

## 2018-07-29 DIAGNOSIS — R262 Difficulty in walking, not elsewhere classified: Secondary | ICD-10-CM

## 2018-07-29 NOTE — Therapy (Signed)
[place], [name], [address] Phone: [phone]   Fax:  [phone]  Wound Care Therapy  Patient Details  Name: Benjamin [name] MRN: [id number] Date of Birth: Jun 03, 1951 Referring Provider (PT): [name]   Encounter Date: 07/29/2018  PT End of Session - 07/29/18 1727    Visit Number  16    Number of Visits  18    Date for PT Re-Evaluation  06/25/18    Authorization Type  [employer] (no visit limit, no auth) patient is working on getting VA coverage    Authorization Time Period  cert runs out on 19/01/931    Authorization - Visit Number  42    Authorization - Number of Visits  18    PT Start Time  1644    PT Stop Time  1715    PT Time Calculation (min)  31 min    Activity Tolerance  Patient tolerated treatment well    Behavior During Therapy  [facility] for tasks assessed/performed       Past Medical History:  Diagnosis Date  . Arthritis   . Essential hypertension   . Gout   . History of stroke    Right occipital May 2018  . Hypertensive heart disease    LVH with grade 2 diastolic dysfunction  . Mixed hyperlipidemia    Statin intolerance  . Rotator cuff tear    Chronic pain  . Sleep apnea    a. intolerant to CPAP  . Tubular adenoma   . Type 2 diabetes mellitus ([place])     Past Surgical History:  Procedure Laterality Date  . APPENDECTOMY    . COLONOSCOPY N/A 10/29/2013   Dr. [name]- sigmoid polyp status post cold snare removal. large sessile ascending colon polyp. debulked with saline assisted snare polypectomy . pancolonic diverticulosis. inadequate prep. tubular adenoma on bx  . COLONOSCOPY N/A 02/22/2014   Dr. [name]: Saline-assisted snare polypectomy/biopsy for sprawling carpet polyp in the ascending colon which could not be completely removed. Status post biopsy (tubular adenoma), tattooed  . COLONOSCOPY N/A 03/01/2014   IZT:IWPY polypectomy hemorrhage s/p bleeding  . COLONOSCOPY N/A 06/08/2015   Procedure: COLONOSCOPY;  Surgeon: [name], MD;  [facility];  Service: Endoscopy;  Laterality: N/A;  0900  . ESOPHAGOGASTRODUODENOSCOPY N/A 02/22/2014   Dr. [name]: Erosive reflux esophagitis. Schatzki ring status post dilation. Gastric and duodenal erosions with benign biopsies.  [name] LAPAROSCOPIC RIGHT COLECTOMY N/A 04/16/2014   Procedure: LAPAROSCOPIC ASSISTED RIGHT COLECTOMY;  Surgeon: [name], MD;  Location: [place];  Service: General;  Laterality: N/A;  . [name] DILATION N/A 02/22/2014   Procedure: [name];  Surgeon: [name], MD;  [facility];  Service: Endoscopy;  Laterality: N/A;  . SAVORY DILATION N/A 02/22/2014   Procedure: SAVORY DILATION;  Surgeon: [name], MD;  [facility];  Service: Endoscopy;  Laterality: N/A;    There were no vitals filed for this visit.   Wound Therapy - 07/29/18 1722    Subjective  Patient arrives with his wife and states he had a nice day off. He denies pain and dressing is intact.    Patient and Family Stated Goals  burns to heal.     Date of Onset  05/12/18    Prior Treatments  ER, Self care, MD     Evaluation and Treatment Procedures Explained to Patient/Family  Yes    Evaluation and Treatment  Procedures  agreed to    Wound Properties Date First Assessed: 05/26/18 Time First Assessed: 0820 Wound Type: Burn Location: Toe (Comment  which one) Location Orientation: Left Wound Description (Comments): 1st   Dressing Type  Gauze (Comment)    Dressing Changed  Changed    Dressing Status  Old drainage    Dressing Change Frequency  PRN    Site / Wound Assessment  Yellow;Pink    % Wound base Red or Granulating  0%    % Wound base Yellow/Fibrinous Exudate  5%    % Wound base Black/Eschar  0%    % Wound base Other/Granulation Tissue (Comment)  95%   after debridement   Peri-wound Assessment  Intact   pale tissue   Wound Length (cm)  1.5 cm   1.6   Wound Width (cm)  0.5 cm   0.9   Wound  Depth (cm)  0.2 cm   0.2   Wound Volume (cm^3)  0.15 cm^3    Wound Surface Area (cm^2)  0.75 cm^2    Margins  Epibole (rolled edges)    Drainage Amount  Minimal    Drainage Description  Serosanguineous    Treatment  Cleansed;Debridement (Selective)   bladed with scapel followed by dressing.   Selective Debridement - Location  Great toe    Selective Debridement - Tools Used  Forceps;Scalpel;Scissors    Selective Debridement - Tissue Removed  slough, devitalized tissue, slough, and epiboled edges    Wound Therapy - Clinical Statement  Patient's wound continues to decrease in size and is approximating well at inferior margin. Superior margin required debridement of callous and epiboled edges. Bladed superior margin and wound bed with scalpel to further promote approximation. Wound is well granulated with biofilm easily removed from wound bed. Continued with xeroform and gauze wrap to maintain moist healing environment. He will continue to benefit from skilled PT interventions to address impairments and promote healing.    Wound Therapy - Functional Problem List  diffficulty donning and doffing shoes.     Factors Delaying/Impairing Wound Healing  Altered sensation;Diabetes Mellitus;Polypharmacy;Vascular compromise    Hydrotherapy Plan  Debridement;Dressing change;Patient/family education    Wound Therapy - Frequency  2X / week   Three times a week for one week then 2x a week for 7 weeks.    Wound Therapy - Current Recommendations  PT    Wound Plan  PT to be educated on prevention of wounds with DM, educated on self care, wounds to be debrided and dressed to maintain a healing enviornment.     Dressing   1st toe, xeroform  gauze.        PT Short Term Goals - 06/26/18 1204      PT SHORT TERM GOAL #1   Title  Pt to verbalize the importance of wearing shoes at all time.    Time  1    Period  Weeks    Status  Achieved      PT SHORT TERM GOAL #2   Title  PT Lt  second and third toes to be  healed     Time  2    Period  Weeks    Status  Achieved        PT Long Term Goals - 06/26/18 1205      PT LONG TERM GOAL #1   Title  PT Lt great toe to have no eschar to prevent infection.     Time  4    Period  Weeks    Status  Achieved      PT LONG TERM GOAL #2   Title  PT LT great toe burn to have approximated to no greater than 2 x 2 to allow pt and wife to be confident in self caring until wound heals.     Time  4    Period  Weeks    Status  Partially Met        Plan - 07/29/18 1728    Clinical Impression Statement  see above    Rehab Potential  Good    PT Frequency  2x / week    PT Duration  8 weeks   additional 4 weeks for 8 weeks total    PT Treatment/Interventions  ADLs/Self Care Home Management   sharp debridement and dressing change.    PT Next Visit Plan  Continue with sharp debridement and dressing change to promote a healthy environment for wound healing.     Consulted and Agree with Plan of Care  Patient;Family member/caregiver       Patient will benefit from skilled therapeutic intervention in order to improve the following deficits and impairments:  Decreased balance, Decreased skin integrity  Visit Diagnosis: Second degree burn of great toe of foot, left, sequela  Partial thickness burn of left third toe, subsequent encounter  Difficulty in walking, not elsewhere classified  Deep full thickness burn of toe of left foot, subsequent encounter  Partial thickness burn of left second toe, subsequent encounter     Problem List Patient Active Problem List   Diagnosis Date Noted  . CVA (cerebral vascular accident) ([place]) 01/21/2017  . CKD (chronic kidney disease) 01/21/2017  . History of colonic polyps   . Diverticulosis of colon without hemorrhage   . Adenomatous colon polyp 04/16/2014  . Preoperative cardiovascular examination 03/24/2014  . Hypertensive heart disease 03/24/2014  . Personal history of colonic polyps 03/04/2014  . Rectal  bleeding 03/01/2014  . Diverticulosis of colon 03/01/2014  . Gout 12/26/2012  . Obesity 12/20/2012  . Essential hypertension, benign 10/22/2011  . Hyperlipidemia 10/22/2011  . DM (diabetes mellitus) ([place]) 10/22/2011    [name], PT, DPT Physical Therapist with [facility]  07/29/2018 5:29 PM    [place], [name], [address] Phone: [phone]   Fax:  [phone]  Name: MARKESE BLOXHAM MRN: [id number] Date of Birth: 02-14-1951

## 2018-08-01 ENCOUNTER — Ambulatory Visit (HOSPITAL_COMMUNITY): Payer: Non-veteran care

## 2018-08-05 ENCOUNTER — Encounter (HOSPITAL_COMMUNITY): Payer: Self-pay

## 2018-08-05 ENCOUNTER — Other Ambulatory Visit: Payer: Self-pay

## 2018-08-05 ENCOUNTER — Ambulatory Visit (HOSPITAL_COMMUNITY): Payer: No Typology Code available for payment source

## 2018-08-05 DIAGNOSIS — T25332D Burn of third degree of left toe(s) (nail), subsequent encounter: Secondary | ICD-10-CM

## 2018-08-05 DIAGNOSIS — T25232S Burn of second degree of left toe(s) (nail), sequela: Secondary | ICD-10-CM | POA: Diagnosis not present

## 2018-08-05 DIAGNOSIS — T25232D Burn of second degree of left toe(s) (nail), subsequent encounter: Secondary | ICD-10-CM

## 2018-08-05 DIAGNOSIS — R262 Difficulty in walking, not elsewhere classified: Secondary | ICD-10-CM

## 2018-08-05 NOTE — Therapy (Signed)
[place], [name], [address] Phone: [phone]   Fax:  [phone]  Wound Care Therapy  Patient Details  Name: Benjamin [name] MRN: [id number] Date of Birth: 07/27/1951 Referring Provider (PT): [name]   Encounter Date: 08/05/2018  PT End of Session - 08/05/18 1711    Visit Number  17    Number of Visits  18    Date for PT Re-Evaluation  06/25/18    Authorization Type  [employer] (no visit limit, no auth) patient is working on getting [facility] coverage    Authorization Time Period  cert runs out on 27/10/5364; 07/22/18-08/15/18    Authorization - Visit Number  82    Authorization - Number of Visits  18    PT Start Time  1630    PT Stop Time  1705    PT Time Calculation (min)  35 min    Activity Tolerance  Patient tolerated treatment well    Behavior During Therapy  [facility] for tasks assessed/performed       Past Medical History:  Diagnosis Date  . Arthritis   . Essential hypertension   . Gout   . History of stroke    Right occipital May 2018  . Hypertensive heart disease    LVH with grade 2 diastolic dysfunction  . Mixed hyperlipidemia    Statin intolerance  . Rotator cuff tear    Chronic pain  . Sleep apnea    a. intolerant to CPAP  . Tubular adenoma   . Type 2 diabetes mellitus ([place])     Past Surgical History:  Procedure Laterality Date  . APPENDECTOMY    . COLONOSCOPY N/A 10/29/2013   Dr. [name]- sigmoid polyp status post cold snare removal. large sessile ascending colon polyp. debulked with saline assisted snare polypectomy . pancolonic diverticulosis. inadequate prep. tubular adenoma on bx  . COLONOSCOPY N/A 02/22/2014   Dr. [name]: Saline-assisted snare polypectomy/biopsy for sprawling carpet polyp in the ascending colon which could not be completely removed. Status post biopsy (tubular adenoma), tattooed  . COLONOSCOPY N/A 03/01/2014   YQI:HKVQ polypectomy hemorrhage s/p bleeding  . COLONOSCOPY  N/A 06/08/2015   Procedure: COLONOSCOPY;  Surgeon: [name], MD;  [facility];  Service: Endoscopy;  Laterality: N/A;  0900  . ESOPHAGOGASTRODUODENOSCOPY N/A 02/22/2014   Dr. [name]: Erosive reflux esophagitis. Schatzki ring status post dilation. Gastric and duodenal erosions with benign biopsies.  [name] LAPAROSCOPIC RIGHT COLECTOMY N/A 04/16/2014   Procedure: LAPAROSCOPIC ASSISTED RIGHT COLECTOMY;  Surgeon: [name], MD;  Location: [facility];  Service: General;  Laterality: N/A;  . [name] DILATION N/A 02/22/2014   Procedure: [name];  Surgeon: [name], MD;  [facility];  Service: Endoscopy;  Laterality: N/A;  . SAVORY DILATION N/A 02/22/2014   Procedure: SAVORY DILATION;  Surgeon: [name], MD;  [facility];  Service: Endoscopy;  Laterality: N/A;    There were no vitals filed for this visit.    Wound Therapy - 08/05/18 1707    Subjective  Patient arrives with dressing intact. He states he had a good week and denies pain.    Patient and Family Stated Goals  burns to heal.     Date of Onset  05/12/18    Prior Treatments  ER, Self care, MD     Pain Scale  0-10    Pain Score  0-No pain    Evaluation and  Treatment Procedures Explained to Patient/Family  Yes    Evaluation and Treatment Procedures  agreed to    Wound Properties Date First Assessed: 05/26/18 Time First Assessed: 0820 Wound Type: Burn Location: Toe (Comment  which one) Location Orientation: Left Wound Description (Comments): 1st   Dressing Type  Gauze (Comment)    Dressing Status  Old drainage    Dressing Change Frequency  PRN    Site / Wound Assessment  Yellow;Pink    % Wound base Red or Granulating  0%    % Wound base Yellow/Fibrinous Exudate  5%    % Wound base Black/Eschar  0%    % Wound base Other/Granulation Tissue (Comment)  95%   after debridement   Peri-wound Assessment  Intact   pale tissue   Wound Length (cm)  1.1 cm    Wound Width (cm)  0.5 cm    Wound  Depth (cm)  0.2 cm    Wound Volume (cm^3)  0.11 cm^3    Wound Surface Area (cm^2)  0.55 cm^2    Margins  Epibole (rolled edges)    Drainage Amount  Minimal    Drainage Description  Serosanguineous    Treatment  Cleansed;Debridement (Selective)   bladed with scapel followed by dressing.   Selective Debridement - Location  Great toe    Selective Debridement - Tools Used  Forceps;Scalpel;Scissors    Selective Debridement - Tissue Removed  slough, devitalized tissue, slough, and epiboled edges    Wound Therapy - Clinical Statement  Wound continues to approximate from inferior margin. Slough was visible in wounds bed and required debridement after cleansing, revealing well granulated wound. Superior margin of wound has decreased callous around edge but continued to require debridement to reduce epiboled edge. Continued with xeroform and gauze wrap to maintain moist healing environment. He will continue to benefit from skilled PT interventions to address impairments and promote healing.    Wound Therapy - Functional Problem List  diffficulty donning and doffing shoes.     Factors Delaying/Impairing Wound Healing  Altered sensation;Diabetes Mellitus;Polypharmacy;Vascular compromise    Hydrotherapy Plan  Debridement;Dressing change;Patient/family education    Wound Therapy - Frequency  2X / week   Three times a week for one week then 2x a week for 7 weeks.    Wound Therapy - Current Recommendations  PT    Wound Plan  PT to be educated on prevention of wounds with DM, educated on self care, wounds to be debrided and dressed to maintain a healing enviornment.     Dressing   1st toe, xeroform  gauze.        PT Short Term Goals - 06/26/18 1204      PT SHORT TERM GOAL #1   Title  Pt to verbalize the importance of wearing shoes at all time.    Time  1    Period  Weeks    Status  Achieved      PT SHORT TERM GOAL #2   Title  PT Lt  second and third toes to be healed     Time  2    Period  Weeks     Status  Achieved        PT Long Term Goals - 06/26/18 1205      PT LONG TERM GOAL #1   Title  PT Lt great toe to have no eschar to prevent infection.     Time  4    Period  Weeks    Status  Achieved      PT LONG TERM GOAL #2   Title  PT LT great toe burn to have approximated to no greater than 2 x 2 to allow pt and wife to be confident in self caring until wound heals.     Time  4    Period  Weeks    Status  Partially Met       Plan - 08/05/18 1713    Clinical Impression Statement  see above    Rehab Potential  Good    PT Frequency  2x / week    PT Duration  8 weeks   additional 4 weeks for 8 weeks total    PT Treatment/Interventions  ADLs/Self Care Home Management   sharp debridement and dressing change.    PT Next Visit Plan  Continue with sharp debridement and dressing change to promote a healthy environment for wound healing.     Consulted and Agree with Plan of Care  Patient;Family member/caregiver       Patient will benefit from skilled therapeutic intervention in order to improve the following deficits and impairments:  Decreased balance, Decreased skin integrity  Visit Diagnosis: Second degree burn of great toe of foot, left, sequela  Partial thickness burn of left third toe, subsequent encounter  Difficulty in walking, not elsewhere classified  Deep full thickness burn of toe of left foot, subsequent encounter  Partial thickness burn of left second toe, subsequent encounter     Problem List Patient Active Problem List   Diagnosis Date Noted  . CVA (cerebral vascular accident) ([place]) 01/21/2017  . CKD (chronic kidney disease) 01/21/2017  . History of colonic polyps   . Diverticulosis of colon without hemorrhage   . Adenomatous colon polyp 04/16/2014  . Preoperative cardiovascular examination 03/24/2014  . Hypertensive heart disease 03/24/2014  . Personal history of colonic polyps 03/04/2014  . Rectal bleeding 03/01/2014  . Diverticulosis of  colon 03/01/2014  . Gout 12/26/2012  . Obesity 12/20/2012  . Essential hypertension, benign 10/22/2011  . Hyperlipidemia 10/22/2011  . DM (diabetes mellitus) ([place]) 10/22/2011    [name], PT, DPT Physical Therapist with [facility]  08/05/2018 5:13 PM    [facility] [address] [place], [name], [address] Phone: [phone]   Fax:  [phone]  Name: IGNATIUS KLOOS MRN: [id number] Date of Birth: May 18, 1951

## 2018-08-08 ENCOUNTER — Ambulatory Visit (HOSPITAL_COMMUNITY): Payer: Self-pay | Admitting: Physical Therapy

## 2018-08-11 ENCOUNTER — Ambulatory Visit (HOSPITAL_COMMUNITY): Payer: No Typology Code available for payment source

## 2018-08-11 ENCOUNTER — Telehealth (HOSPITAL_COMMUNITY): Payer: Self-pay

## 2018-08-11 NOTE — Telephone Encounter (Signed)
08/11/18  wife left a message to cx but no reason was given

## 2018-08-12 ENCOUNTER — Telehealth (HOSPITAL_COMMUNITY): Payer: Self-pay

## 2018-08-12 ENCOUNTER — Ambulatory Visit (HOSPITAL_COMMUNITY): Payer: No Typology Code available for payment source

## 2018-08-12 NOTE — Telephone Encounter (Signed)
No Show # 1: I called and spoke with patient regarding missed appointment and he states he did not think he had one and that he cancelled yesterday's. He is aware his next appointment is next Thursday and that he will change his dressing. I directed him to wash his wound daily and change the dressing using an over the counter ointment and guaze. He said he would do this with no problem.   [name], PT, DPT Physical Therapist with [facility]  08/12/2018 5:21 PM

## 2018-08-21 ENCOUNTER — Ambulatory Visit (HOSPITAL_COMMUNITY): Payer: Medicare HMO

## 2018-08-22 ENCOUNTER — Ambulatory Visit (HOSPITAL_COMMUNITY): Payer: Non-veteran care

## 2018-08-28 ENCOUNTER — Ambulatory Visit (HOSPITAL_COMMUNITY): Payer: Medicare HMO | Attending: Physician Assistant

## 2018-08-28 ENCOUNTER — Telehealth (HOSPITAL_COMMUNITY): Payer: Self-pay

## 2018-08-28 NOTE — Telephone Encounter (Signed)
No Show # 2: I called Mr. Call at his mobile phone number to check in as he did not arrive for his 4:00 pm appointment today and he has not been to therapy since 08/05/18. I asked him to call our office and let us know how his wound is doing and if he is managing it with self care well. I informed him he has a visit scheduled next Thursday 09/04/18 and to call and confirm he will be here of let us know he would like to be discharged.  [name], PT, DPT Physical Therapist with [facility]  08/28/2018 4:59 PM

## 2018-08-29 ENCOUNTER — Ambulatory Visit (HOSPITAL_COMMUNITY): Payer: Non-veteran care

## 2018-09-02 DIAGNOSIS — H35373 Puckering of macula, bilateral: Secondary | ICD-10-CM | POA: Insufficient documentation

## 2018-09-02 DIAGNOSIS — E113393 Type 2 diabetes mellitus with moderate nonproliferative diabetic retinopathy without macular edema, bilateral: Secondary | ICD-10-CM | POA: Insufficient documentation

## 2018-09-02 DIAGNOSIS — H2513 Age-related nuclear cataract, bilateral: Secondary | ICD-10-CM | POA: Insufficient documentation

## 2018-09-04 ENCOUNTER — Telehealth (HOSPITAL_COMMUNITY): Payer: Self-pay

## 2018-09-04 ENCOUNTER — Telehealth (HOSPITAL_COMMUNITY): Payer: Self-pay | Admitting: Physical Therapy

## 2018-09-04 ENCOUNTER — Ambulatory Visit (HOSPITAL_COMMUNITY): Payer: Medicare HMO

## 2018-09-04 NOTE — Telephone Encounter (Signed)
Wife called to cx all future apptments, his toe has healed and they are doing fine.  Thanks to everyone for the care he received while coming here.

## 2018-09-04 NOTE — Telephone Encounter (Signed)
09/04/18  wife left a message to cx today and future appts... said that his foot had healed

## 2018-09-09 ENCOUNTER — Encounter (HOSPITAL_COMMUNITY): Payer: Self-pay | Admitting: Physical Therapy

## 2018-09-09 NOTE — Therapy (Signed)
[place], [name], [address] Phone: [phone]   Fax:  [phone]  Patient Details  Name: Benjamin [name] MRN: [id number] Date of Birth: Dec 19, 1950 Referring Provider:  No ref. provider found  Encounter Date: 09/09/2018   PHYSICAL THERAPY DISCHARGE SUMMARY  Visits from Start of Care: 17  Current functional level related to goals / functional outcomes: Wound is healed   Remaining deficits: none   Education / Equipment: HEP Plan: Patient agrees to discharge.  Patient goals were met. Patient is being discharged due to meeting the stated rehab goals.  ?????      Rayetta Humphrey, PT CLT [phone] 09/09/2018, 1:30 PM  [facility] [address] [place], [name], [address] Phone: [phone]   Fax:  [phone]

## 2018-09-12 ENCOUNTER — Ambulatory Visit (HOSPITAL_COMMUNITY): Payer: Non-veteran care | Admitting: Physical Therapy

## 2018-10-03 ENCOUNTER — Ambulatory Visit: Payer: Medicare HMO | Admitting: Cardiology

## 2018-11-11 NOTE — Progress Notes (Signed)
Cardiology Office Note  Date: 11/12/2018   ID: [name], Benjamin January 14, 1951, MRN [id number]  PCP: [facility]  Primary Cardiologist: Rozann Lesches, MD   Chief Complaint  Patient presents with  . Cardiac follow-up    History of Present Illness: Benjamin [name] is a 68 y.o. male last seen in April 2019 by Ms. Strader PA-C.  He is here today with his wife for a follow-up visit.  He states that he has been doing well. He does not report any angina symptoms, has stable NYHA class I-II dyspnea with typical activities.  I reviewed his medications which are outlined below.  He states that his diabetes has been better controlled with hemoglobin A1c near 6%.  Blood pressure is well controlled today.  Past Medical History:  Diagnosis Date  . Arthritis   . Essential hypertension   . Gout   . History of stroke    Right occipital May 2018  . Hypertensive heart disease    LVH with grade 2 diastolic dysfunction  . Mixed hyperlipidemia    Statin intolerance  . Rotator cuff tear    Chronic pain  . Sleep apnea    a. intolerant to CPAP  . Tubular adenoma   . Type 2 diabetes mellitus ([place])     Past Surgical History:  Procedure Laterality Date  . APPENDECTOMY    . COLONOSCOPY N/A 10/29/2013   Dr. [name]- sigmoid polyp status post cold snare removal. large sessile ascending colon polyp. debulked with saline assisted snare polypectomy . pancolonic diverticulosis. inadequate prep. tubular adenoma on bx  . COLONOSCOPY N/A 02/22/2014   Dr. [name]: Saline-assisted snare polypectomy/biopsy for sprawling carpet polyp in the ascending colon which could not be completely removed. Status post biopsy (tubular adenoma), tattooed  . COLONOSCOPY N/A 03/01/2014   VVO:HYWV polypectomy hemorrhage s/p bleeding  . COLONOSCOPY N/A 06/08/2015   Procedure: COLONOSCOPY;  Surgeon: [name], MD;  [facility];  Service: Endoscopy;  Laterality: N/A;  0900  .  ESOPHAGOGASTRODUODENOSCOPY N/A 02/22/2014   Dr. [name]: Erosive reflux esophagitis. Schatzki ring status post dilation. Gastric and duodenal erosions with benign biopsies.  [name] LAPAROSCOPIC RIGHT COLECTOMY N/A 04/16/2014   Procedure: LAPAROSCOPIC ASSISTED RIGHT COLECTOMY;  Surgeon: [name], MD;  Location: [facility];  Service: General;  Laterality: N/A;  . [name] DILATION N/A 02/22/2014   Procedure: [name];  Surgeon: [name], MD;  [facility];  Service: Endoscopy;  Laterality: N/A;  . SAVORY DILATION N/A 02/22/2014   Procedure: SAVORY DILATION;  Surgeon: [name], MD;  [facility];  Service: Endoscopy;  Laterality: N/A;    Current Outpatient Medications  Medication Sig Dispense Refill  . acetaminophen (TYLENOL) 650 MG CR tablet Take 650 mg by mouth every 8 (eight) hours as needed for pain.    [name] allopurinol (ZYLOPRIM) 100 MG tablet Take 100 mg by mouth daily.    [name] amLODipine (NORVASC) 10 MG tablet Take 1 tablet (10 mg total) by mouth daily. 30 tablet 6  . aspirin 325 MG tablet Take 1 tablet (325 mg total) by mouth daily.    . chlorthalidone (HYGROTON) 25 MG tablet Take 12.5 mg by mouth daily.     . diazepam (VALIUM) 10 MG tablet Take 10 mg by mouth every 6 (six) hours as needed for anxiety.    . insulin glargine (LANTUS) 100 UNIT/ML injection Inject 50 Units into the skin 2 (two) times daily.     [name]  losartan (COZAAR) 100 MG tablet Take 1 tablet (100 mg total) by mouth daily. 30 tablet 6  . methocarbamol (ROBAXIN) 500 MG tablet Take 500 mg by mouth daily as needed for muscle spasms.     . Metoprolol Tartrate 75 MG TABS Take 75 mg by mouth 2 (two) times daily.    . pantoprazole (PROTONIX) 40 MG tablet Take 1 tablet (40 mg total) by mouth daily. 30 tablet 11  . potassium chloride SA (K-DUR,KLOR-CON) 20 MEQ tablet Take 1 tablet (20 mEq total) by mouth daily. 30 tablet 6  . Semaglutide (OZEMPIC) 1 MG/DOSE SOPN Inject 1 mg into the skin every Wednesday.       . traMADol (ULTRAM) 50 MG tablet Take 50 mg by mouth daily as needed.      No current facility-administered medications for this visit.    Allergies:  Niacin-lovastatin er; Statins; and Zocor [simvastatin - high dose]   Social History: The patient  reports that he quit smoking about 43 years ago. His smoking use included cigarettes. He started smoking about 57 years ago. He has never used smokeless tobacco. He reports that he does not drink alcohol or use drugs.   ROS:  Please see the history of present illness. Otherwise, complete review of systems is positive for none.  All other systems are reviewed and negative.   Physical Exam: VS:  BP 113/75   Pulse 66   Ht 6\' 2"  (1.88 m)   Wt 266 lb (120.7 kg)   SpO2 93% Comment: on room air  BMI 34.15 kg/m , BMI Body mass index is 34.15 kg/m.  Wt Readings from Last 3 Encounters:  11/12/18 266 lb (120.7 kg)  05/13/18 260 lb (117.9 kg)  12/17/17 274 lb (124.3 kg)    General: Obese male, appears comfortable at rest. HEENT: Conjunctiva and lids normal, oropharynx clear. Neck: Supple, no elevated JVP or carotid bruits, no thyromegaly. Lungs: Clear to auscultation, nonlabored breathing at rest. Cardiac: Regular rate and rhythm, no S3, 2/6 systolic murmur. Abdomen: Soft, nontender, bowel sounds present. Extremities: No pitting edema, distal pulses 2+. Skin: Warm and dry. Musculoskeletal: No kyphosis. Neuropsychiatric: Alert and oriented x3, affect grossly appropriate.  ECG: I personally reviewed the tracing from 12/17/2017 which showed sinus bradycardia with prolonged PR interval and atrial bigeminy, incomplete right bundle branch block.  Recent Labwork: 05/13/2018: BUN 26; Creatinine, Ser 1.74; Hemoglobin 14.4; Platelets 217; Potassium 4.0; Sodium 139     Component Value Date/Time   CHOL 180 01/22/2017 0506   TRIG 391 (H) 01/22/2017 0506   HDL 25 (L) 01/22/2017 0506   CHOLHDL 7.2 01/22/2017 0506   VLDL 78 (H) 01/22/2017 0506   LDLCALC  77 01/22/2017 0506    Other Studies Reviewed Today:  Echocardiogram 01/23/2017: Study Conclusions  - Left ventricle: The cavity size was normal. Wall thickness was   increased in a pattern of mild LVH. Systolic function was normal.   The estimated ejection fraction was in the range of 60% to 65%.   Wall motion was normal; there were no regional wall motion   abnormalities. The study is not technically sufficient to allow   evaluation of LV diastolic function. - Aortic valve: Moderately calcified annulus. Trileaflet;   moderately thickened leaflets. Valve area (VTI): 2.45 cm^2. Valve   area (Vmax): 2.29 cm^2. - Technically difficult study.  Carotid Dopplers 01/22/2017: IMPRESSION: 1. Mild bilateral carotid bifurcation plaque resulting in less than 50% diameter stenosis. 2.  Antegrade bilateral vertebral arterial flow.  Assessment and  Plan:  1.  Essential hypertension.  Patient is doing very well, blood pressure looks good today on current regimen.  Beta-blocker dose has been cut back somewhat but otherwise his regimen remains stable.  He has also been able to lose some weight which has been beneficial.  2.  Asymptomatic, nonobstructive carotid atherosclerosis.  Continue aspirin and statin therapy.  3.  Hyperlipidemia, continues on Pravachol with follow-up by PCP.  Current medicines were reviewed with the patient today.  Disposition: Follow-up in 6 months.  Signed, Satira Sark, MD, [facility] 11/12/2018 1:28 PM    [facility] [address] [place], [place] [address] Phone: [phone]; Fax: [phone]

## 2018-11-12 ENCOUNTER — Ambulatory Visit: Payer: Medicare HMO | Admitting: Cardiology

## 2018-11-12 ENCOUNTER — Encounter: Payer: Self-pay | Admitting: Cardiology

## 2018-11-12 VITALS — BP 113/75 | HR 66 | Ht 74.0 in | Wt 266.0 lb

## 2018-11-12 DIAGNOSIS — I119 Hypertensive heart disease without heart failure: Secondary | ICD-10-CM

## 2018-11-12 DIAGNOSIS — E782 Mixed hyperlipidemia: Secondary | ICD-10-CM

## 2018-11-12 DIAGNOSIS — I6523 Occlusion and stenosis of bilateral carotid arteries: Secondary | ICD-10-CM

## 2018-11-12 NOTE — Patient Instructions (Signed)

## 2019-08-05 ENCOUNTER — Telehealth: Payer: Self-pay | Admitting: Cardiology

## 2019-08-05 NOTE — Telephone Encounter (Signed)
Called to schedule pt for his 6 month f/u and was told he's now being seen through the [place] now.

## 2020-06-17 IMAGING — CT CT FOOT*L* W/O CM
3 of 5 series · 10 of 36 positions shown, 11 images · non-contrast
Comparison: None.

CLINICAL DATA: Wounds of the left great and second toes all burning
[place] [REDACTED]ed. History of diabetes.

EXAM:
CT OF THE LEFT FOOT WITHOUT CONTRAST
TECHNIQUE: Multidetector CT imaging of the left foot was performed according to
the standard protocol. Multiplanar CT image reconstructions were
also generated.

[Series 5: cor bone · axial · 0.28mm/px · z∈[-1723,-1723]mm · 1 of 148 slices shown, 2 images]
[im 85/148  soft-tissue]
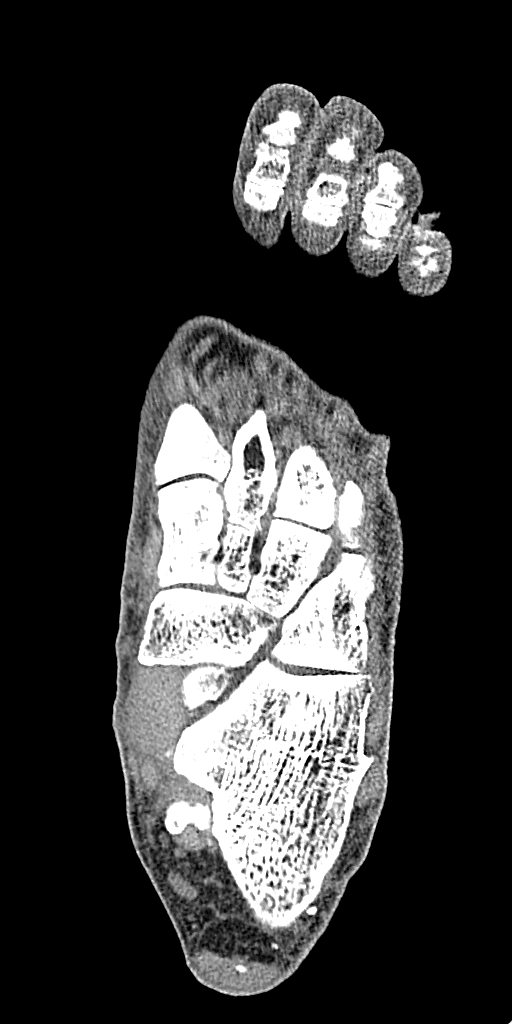
[im 85/148  bone]
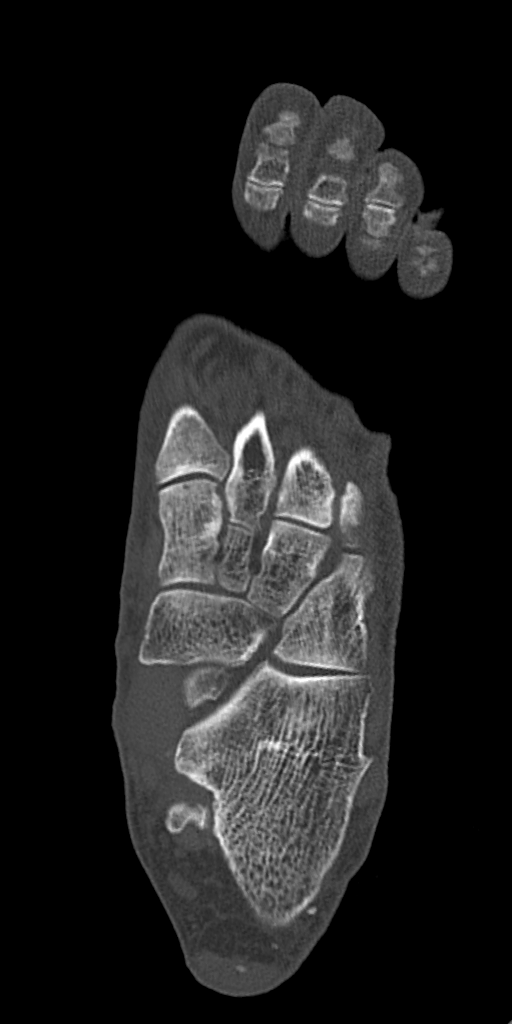

[Series 6: sag bone · sagittal · 0.28mm/px · 6 of 155 slices shown]
[im 28/155  bone]
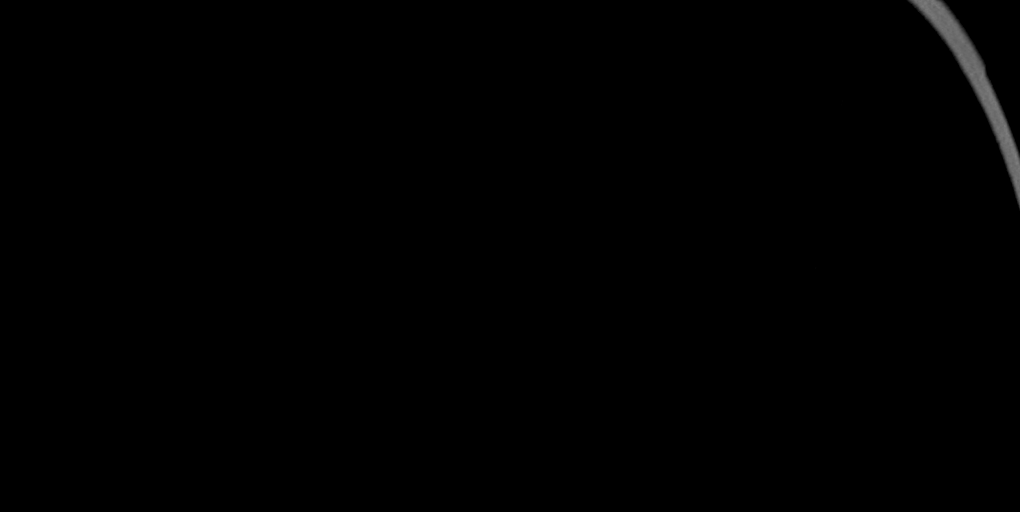
[im 52/155  bone]
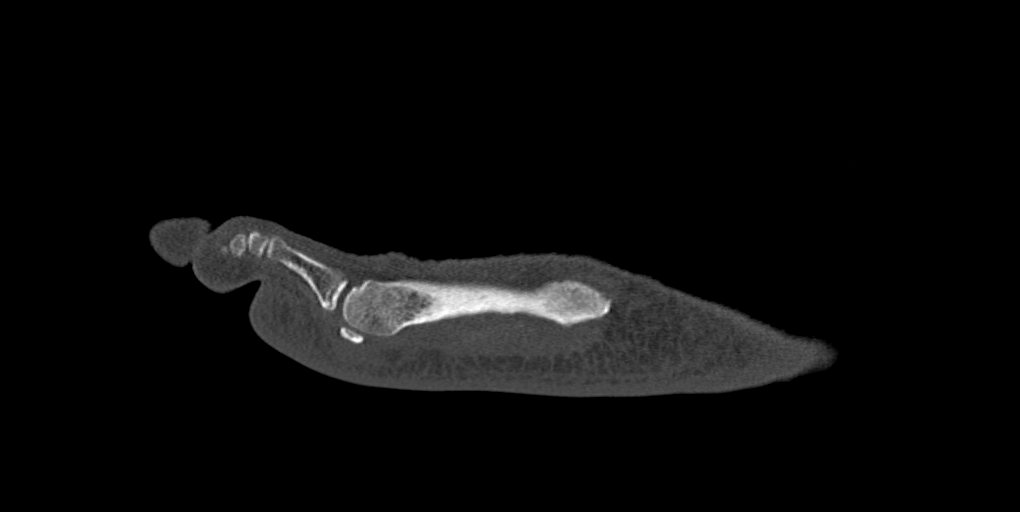
[im 65/155  soft-tissue]
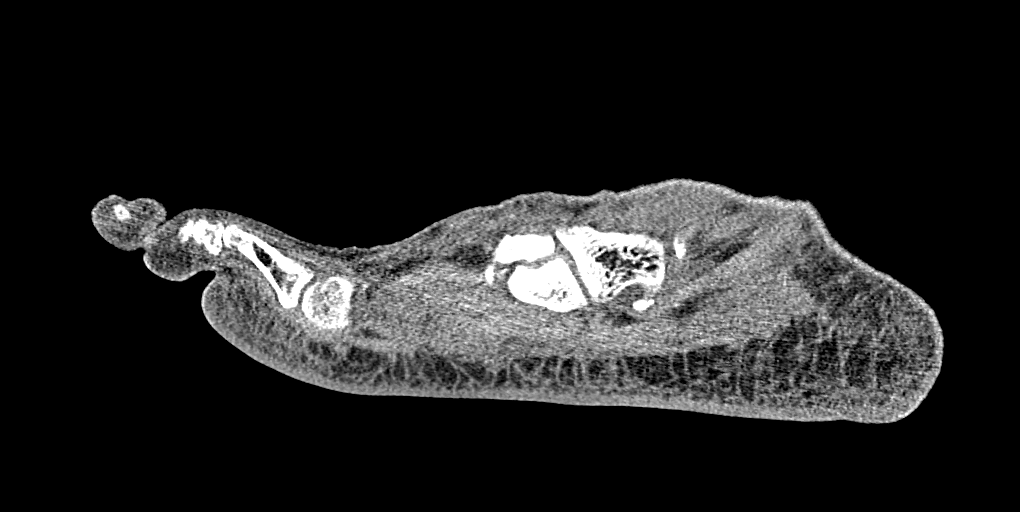
[im 76/155  bone]
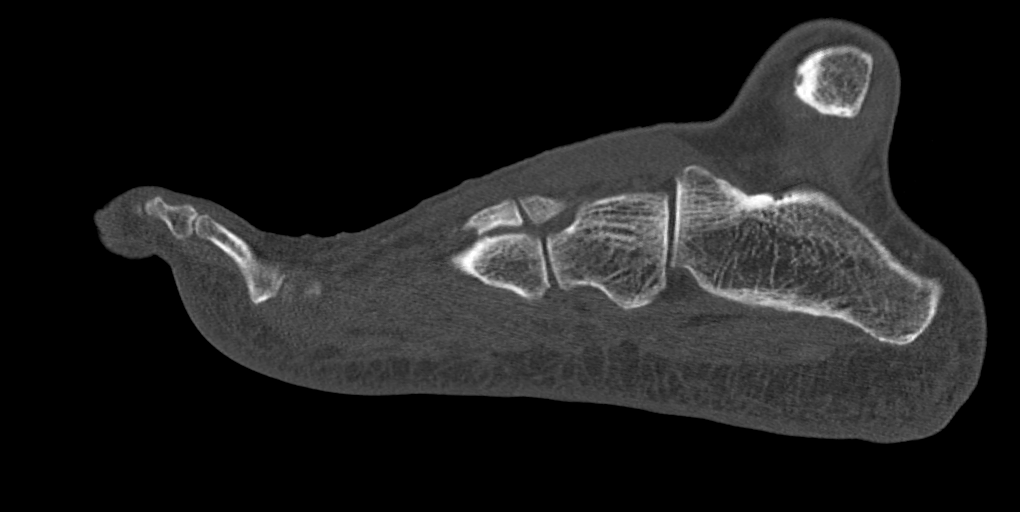
[im 100/155  bone]
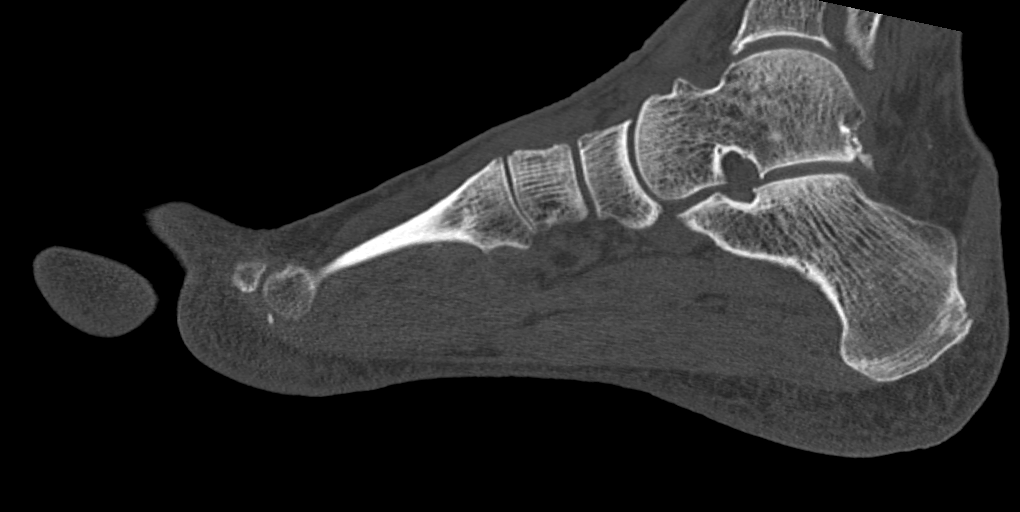
[im 124/155  bone]
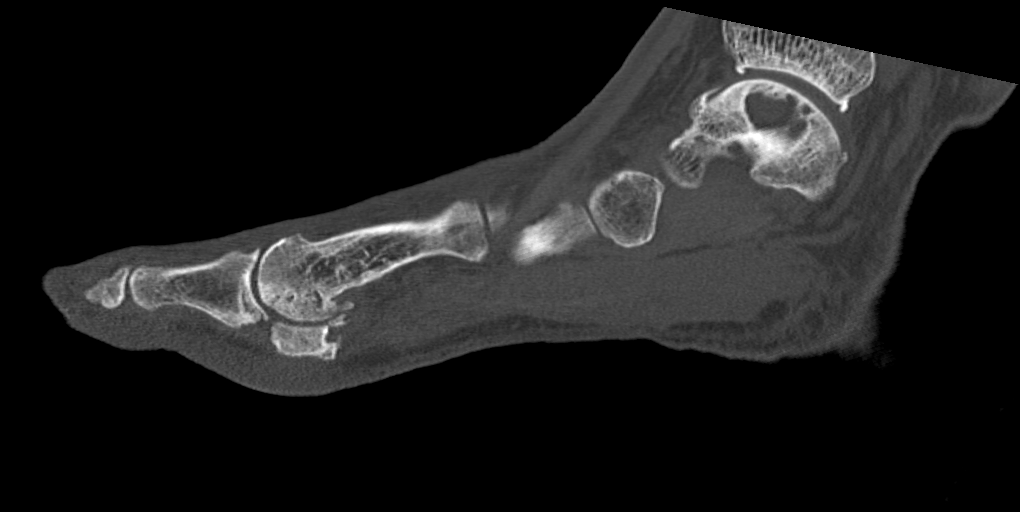

[Series 7: axial st · coronal · 0.29mm/px · 3 of 278 slices shown]
[im 86/278  bone]
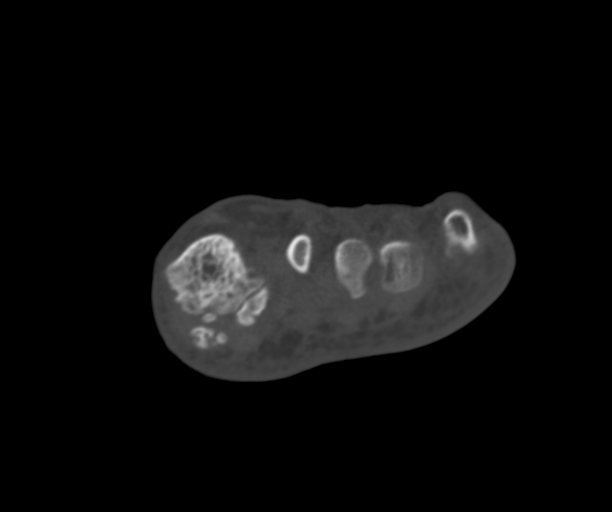
[im 121/278  bone]
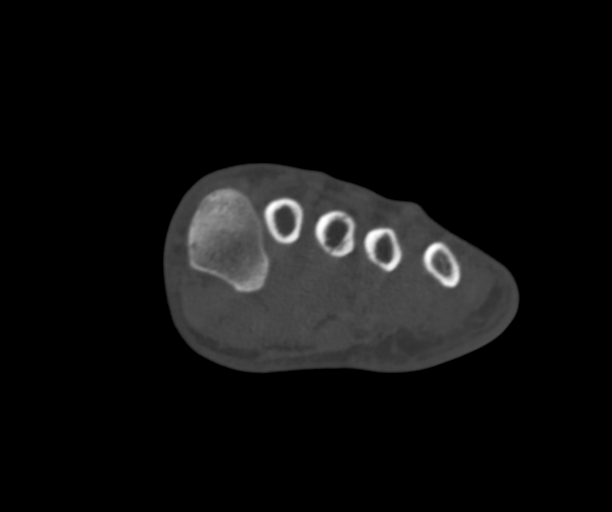
[im 157/278  bone]
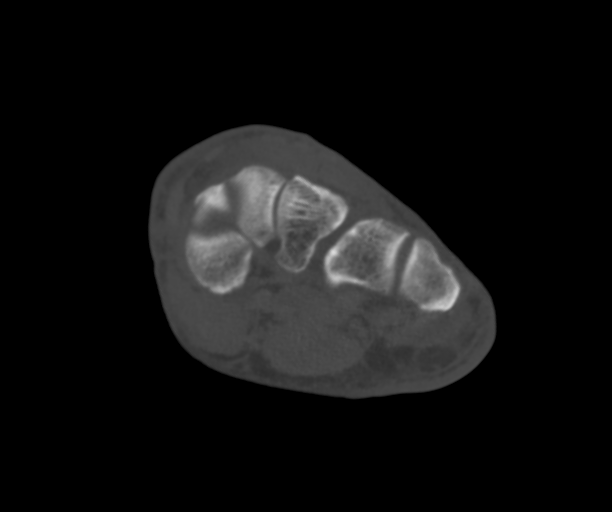

[10 of 36 positions shown; findings below may reference images not displayed]

FINDINGS: Bones/Joint/Cartilage

There is an approximately 2.1 x 1.2 x 1.3 cm subchondral cyst of the
medial talus series [DATE]. Tiny well corticated ossifications are
identified along the lateral aspect of the tibiotalar joint likely
degenerative in etiology or related to old remote trauma. Well
corticated ossifications also project over the medial malleolus. A
bone island of the calcaneus is identified adjacent to the subtalar
joint.

Calcaneal enthesophytes are noted along the plantar dorsal aspect.
Mild osteoarthritic change across the talonavicular joint with
dorsal spurring. An accessory ossicle seen adjacent to the cuboid.
The cuneiform bones are intact.

Osteoarthritis at the head of the first metatarsal with subchondral
sclerosis and cystic change is noted with degenerative change of the
adjacent sesamoids. No frank bone destruction. Osteoarthritis of the
first MTP. The second through fifth rays are intact. No frank bone
destruction to suggest osteomyelitis.

Ligaments

Suboptimally assessed by CT.

Muscles and Tendons

The tendons crossing the ankle joint are of normal morphology. No
entrapment. No tenosynovitis. No intramuscular mass, edema or
hemorrhage is apparent.

Soft tissues

Soft tissue induration along the plantar aspect of the forefoot
especially at the level of the great toe likely representing
inflammatory change.
IMPRESSION: 1. Soft tissue induration along the plantar aspect of the great toe.
This may be secondary to the reported for injury and may reflect
inflammatory change and/or edema.
2. No evidence of osteomyelitis.  Degenerative changes as above.
3. 2.1 x 1.2 x 1.3 cm degenerative cyst of the talus.

## 2020-07-13 ENCOUNTER — Encounter: Payer: Self-pay | Admitting: Internal Medicine

## 2020-08-16 ENCOUNTER — Ambulatory Visit: Payer: Medicare HMO | Admitting: Internal Medicine

## 2020-08-16 ENCOUNTER — Telehealth: Payer: Self-pay | Admitting: *Deleted

## 2020-08-16 ENCOUNTER — Encounter: Payer: Self-pay | Admitting: Internal Medicine

## 2020-08-16 ENCOUNTER — Other Ambulatory Visit: Payer: Self-pay

## 2020-08-16 VITALS — BP 148/75 | HR 50 | Temp 96.9°F | Ht 74.0 in | Wt 259.4 lb

## 2020-08-16 DIAGNOSIS — D126 Benign neoplasm of colon, unspecified: Secondary | ICD-10-CM | POA: Diagnosis not present

## 2020-08-16 DIAGNOSIS — E119 Type 2 diabetes mellitus without complications: Secondary | ICD-10-CM | POA: Diagnosis not present

## 2020-08-16 DIAGNOSIS — Z794 Long term (current) use of insulin: Secondary | ICD-10-CM | POA: Diagnosis not present

## 2020-08-16 NOTE — Telephone Encounter (Signed)
LMOVM for pt to call back to schedule TCS with propofol, ASA 3 Dr. [name]

## 2020-08-16 NOTE — Patient Instructions (Signed)
Schedule a surveillance colonoscopy (hx of polyps) Propofol ASA 3  Decrease lantus to 30 units day before procedure and 10 units day of procedure  Need 0730 appointment slot  Continue Protonix 40 mg daily

## 2020-08-16 NOTE — Telephone Encounter (Signed)
Patient spouse called back to schedule procedure. He has been scheduled for 2/28. Aware will mail prep instructions with pre-op/covid test appt.  She also needed me to update med list. Patient now takes Eliquis 5mg  QD, jardiance 12.5mg  daily, tamsolusin 0,4 daily. Patient takes his Lantus every morning 45 units and his pantoprazole twice a day instead of once a day.  Please advise Dr. [name] Thanks!

## 2020-08-16 NOTE — Progress Notes (Signed)
Primary Care Physician:  [facility] Primary Gastroenterologist:  Dr. [name] Pre-Procedure History & Physical: HPI:  Benjamin [name] is a 69 y.o. male here to set up a surveillance colonoscopy. History of multiple colonic adenomas removed over time. Large colonic adenoma in the ascending colon  -  status post right hemicolectomy by Dr. Dalbert Batman in 2015. He is doing well without any bowel complaints. Major health episodes since he was last seen here was a CVA with good neurologic recovery (2018). He is not taking any antiplatelet therapy at this time. He states diabetes has been under good control.   GERD under good control with Protonix 40 mg daily.  Past Medical History:  Diagnosis Date  . Arthritis   . Essential hypertension   . Gout   . History of stroke    Right occipital May 2018  . Hypertensive heart disease    LVH with grade 2 diastolic dysfunction  . Mixed hyperlipidemia    Statin intolerance  . Rotator cuff tear    Chronic pain  . Sleep apnea    a. intolerant to CPAP  . Tubular adenoma   . Type 2 diabetes mellitus ([place])     Past Surgical History:  Procedure Laterality Date  . APPENDECTOMY    . COLONOSCOPY N/A 10/29/2013   Dr. [name]- sigmoid polyp status post cold snare removal. large sessile ascending colon polyp. debulked with saline assisted snare polypectomy . pancolonic diverticulosis. inadequate prep. tubular adenoma on bx  . COLONOSCOPY N/A 02/22/2014   Dr. [name]: Saline-assisted snare polypectomy/biopsy for sprawling carpet polyp in the ascending colon which could not be completely removed. Status post biopsy (tubular adenoma), tattooed  . COLONOSCOPY N/A 03/01/2014   CHY:IFOY polypectomy hemorrhage s/p bleeding  . COLONOSCOPY N/A 06/08/2015   Procedure: COLONOSCOPY;  Surgeon: [name], MD;  [facility];  Service: Endoscopy;  Laterality: N/A;  0900  . ESOPHAGOGASTRODUODENOSCOPY N/A 02/22/2014   Dr. [name]: Erosive reflux  esophagitis. Schatzki ring status post dilation. Gastric and duodenal erosions with benign biopsies.  [name] LAPAROSCOPIC RIGHT COLECTOMY N/A 04/16/2014   Procedure: LAPAROSCOPIC ASSISTED RIGHT COLECTOMY;  Surgeon: [name], MD;  Location: [facility];  Service: General;  Laterality: N/A;  . [name] DILATION N/A 02/22/2014   Procedure: [name];  Surgeon: [name], MD;  [facility];  Service: Endoscopy;  Laterality: N/A;  . SAVORY DILATION N/A 02/22/2014   Procedure: SAVORY DILATION;  Surgeon: [name], MD;  [facility];  Service: Endoscopy;  Laterality: N/A;    Prior to Admission medications   Medication Sig Start Date End Date Taking? Authorizing Provider  acetaminophen (TYLENOL) 650 MG CR tablet Take 650 mg by mouth as needed for pain.    Yes [provider]  allopurinol (ZYLOPRIM) 100 MG tablet Take 100 mg by mouth daily.   Yes [provider]  amLODipine (NORVASC) 10 MG tablet Take 1 tablet (10 mg total) by mouth daily. 02/09/16  Yes Lendon Colonel, NP  chlorthalidone (HYGROTON) 25 MG tablet Take 12.5 mg by mouth daily.    Yes [provider]  diazepam (VALIUM) 10 MG tablet Take 10 mg by mouth every 6 (six) hours as needed for anxiety.   Yes [provider]  insulin glargine (LANTUS) 100 UNIT/ML injection Inject 54 Units into the skin daily.    Yes [provider]  losartan (COZAAR) 100 MG tablet Take 1 tablet (100 mg total) by mouth  daily. 02/09/16  Yes Lendon Colonel, NP  methocarbamol (ROBAXIN) 500 MG tablet Take 500 mg by mouth daily as needed for muscle spasms.    Yes [provider]  Metoprolol Tartrate 75 MG TABS Take 75 mg by mouth 2 (two) times daily.   Yes [provider]  pantoprazole (PROTONIX) 40 MG tablet Take 1 tablet (40 mg total) by mouth daily. 09/13/15  Yes Annitta Needs, NP  potassium chloride SA (K-DUR,KLOR-CON) 20 MEQ tablet Take 1 tablet (20 mEq total) by mouth  daily. 02/09/16  Yes Lendon Colonel, NP  Semaglutide [facility]) 1 MG/DOSE SOPN Inject 1 mg into the skin every Wednesday.    Yes [provider]  traMADol (ULTRAM) 50 MG tablet Take 50 mg by mouth daily as needed.    Yes [provider]  aspirin 325 MG tablet Take 1 tablet (325 mg total) by mouth daily. Patient not taking: Reported on 08/16/2020 01/24/17   Samuella Cota, MD    Allergies as of 08/16/2020 - Review Complete 08/16/2020  Allergen Reaction Noted  . Niacin-lovastatin er Other (See Comments) 10/18/2011  . Statins  04/09/2014  . Zocor [simvastatin - high dose] Other (See Comments) 10/18/2011    Family History  Problem Relation Age of Onset  . Diabetes type II Mother   . Colon cancer Neg Hx     Social History   Socioeconomic History  . Marital status: Married    Spouse name: Not on file  . Number of children: 2  . Years of education: Not on file  . Highest education level: Not on file  Occupational History  . Occupation: Programmer, systems: [employer]   Tobacco Use  . Smoking status: Former Smoker    Types: Cigarettes    Start date: 09/17/1961    Quit date: 09/18/1975    Years since quitting: 44.9  . Smokeless tobacco: Never Used  . Tobacco comment: quit 35 years ago  Vaping Use  . Vaping Use: Never used  Substance and Sexual Activity  . Alcohol use: No    Alcohol/week: 0.0 standard drinks    Comment: None in over a year. prior to that 1-2 beers couple times per week.  . Drug use: No  . Sexual activity: Not on file  Other Topics Concern  . Not on file  Social History Narrative  . Not on file   Social Determinants of Health   Financial Resource Strain:   . Difficulty of Paying Living Expenses: Not on file  Food Insecurity:   . Worried About Charity fundraiser in the Last Year: Not on file  . Ran Out of Food in the Last Year: Not on file  Transportation Needs:   . Lack of Transportation (Medical): Not on file  . Lack of  Transportation (Non-Medical): Not on file  Physical Activity:   . Days of Exercise per Week: Not on file  . Minutes of Exercise per Session: Not on file  Stress:   . Feeling of Stress : Not on file  Social Connections:   . Frequency of Communication with Friends and Family: Not on file  . Frequency of Social Gatherings with Friends and Family: Not on file  . Attends Religious Services: Not on file  . Active Member of Clubs or Organizations: Not on file  . Attends Archivist Meetings: Not on file  . Marital Status: Not on file  Intimate Partner Violence:   . Fear of Current  or Ex-Partner: Not on file  . Emotionally Abused: Not on file  . Physically Abused: Not on file  . Sexually Abused: Not on file    Review of Systems: See HPI, otherwise negative ROS  Physical Exam: BP (!) 148/75   Pulse (!) 50   Temp (!) 96.9 F (36.1 C) (Temporal)   Ht 6\' 2"  (1.88 m)   Wt 259 lb 6.4 oz (117.7 kg)   BMI 33.30 kg/m  General:   Alert,   pleasant and cooperative in NAD Neck:  Supple; no masses or thyromegaly. No significant cervical adenopathy. Lungs:  Clear throughout to auscultation.   No wheezes, crackles, or rhonchi. No acute distress. Heart:  Regular rate and rhythm; no murmurs, clicks, rubs,  or gallops. Abdomen: Non-distended, normal bowel sounds.  Soft and nontender without appreciable mass or hepatosplenomegaly.  Pulses:  Normal pulses noted. Extremities:  Without clubbing or edema.  Impression/Plan: Pleasant 69 year old gentleman with history of multiple colonic adenomas with large colonic adenoma requiring right hemicolectomy 2015. Clinically, doing well from a GI standpoint. No bowel symptoms.  Reflux well controlled on Protonix 40 mg daily.  Recommendations: I have offered the patient a surveillance colonoscopy at this time. The risks, benefits, limitations, alternatives and imponderables have been reviewed with the patient. Questions have been answered. All parties  are agreeable.   Schedule a surveillance colonoscopy (hx of polyps) Propofol ASA 3  Decrease lantus to 30 units day before procedure and 10 units day of procedure  Need 0730 appointment slot  Continue Protonix 40 mg daily    Notice: This dictation was prepared with Dragon dictation along with smaller phrase technology. Any transcriptional errors that result from this process are unintentional and may not be corrected upon review.

## 2020-08-17 ENCOUNTER — Encounter: Payer: Self-pay | Admitting: *Deleted

## 2020-08-17 ENCOUNTER — Telehealth: Payer: Self-pay | Admitting: *Deleted

## 2020-08-17 NOTE — Telephone Encounter (Signed)
PA approved via [employer] for TCS. Auth# [id number] DOS 11/14/2020-12/14/2020

## 2020-08-17 NOTE — Telephone Encounter (Signed)
instructions mailed to patient

## 2020-08-17 NOTE — Telephone Encounter (Signed)
No Eliquis the 2 days prior to the procedure or the day of the procedure.  Decrease Lantus to 30 units in the morning day before the procedure.  Take Jardiance as prescribed up through the day before the procedure.  None the day of the procedure.  730 slot.

## 2020-11-09 NOTE — Patient Instructions (Signed)
Benjamin [name]  11/09/2020     @PREFPERIOPPHARMACY @   Your procedure is scheduled on  11/14/2020.   Report to [facility] at  [phone]  A.M.   Call this number if you have problems the morning of surgery:  [phone]   Remember:  Follow the diet and prep instructions given to you by the office.                       Take these medicines the morning of surgery with A SIP OF WATER         Amlodipine, metoprolol, flomax, robaxin (if needed).              Take 30 units of your insulin on 11/13/2020, the day before your procedure.          Take 10 units of your insulin the morning of your procedure 11/14/2020.  DO NOT take any other medications for diabetes the morning of your procedure.           If your glucose is 70 or below the morning of your procedure, drink 1/2 cup of clear juice and recheck your glucose in 15 minutes. If your glucose is still 70 or below, call [phone] for instructions.        If your glucose is 300 or above the morning of your procedure, call [phone] for instructions.    Please brush your teeth.          Do not wear jewelry, make-up or nail polish.  Do not wear lotions, powders, or perfumes, or deodorant.  Do not shave 48 hours prior to surgery.  Men may shave face and neck.  Do not bring valuables to the hospital.  [facility] is not responsible for any belongings or valuables.   Contacts, dentures or bridgework may not be worn into surgery.  Leave your suitcase in the car.  After surgery it may be brought to your room.  For patients admitted to the hospital, discharge time will be determined by your treatment team.  Patients discharged the day of surgery will not be allowed to drive home and must have someone with them for 24 hours.    Special instructions:  DO NOT smoke tobacco or vape the morning of your procedure.   Please read over the following fact sheets that you were given. Anesthesia Post-op Instructions and Care and Recovery  After Surgery       Colonoscopy, Adult, Care After This sheet gives you information about how to care for yourself after your procedure. Your health care provider may also give you more specific instructions. If you have problems or questions, contact your health care provider. What can I expect after the procedure? After the procedure, it is common to have:  A small amount of blood in your stool for 24 hours after the procedure.  Some gas.  Mild cramping or bloating of your abdomen. Follow these instructions at home: Eating and drinking  Drink enough fluid to keep your urine pale yellow.  Follow instructions from your health care provider about eating or drinking restrictions.  Resume your normal diet as instructed by your health care provider. Avoid heavy or fried foods that are hard to digest.   Activity  Rest as told by your health care provider.  Avoid sitting for a long time without moving. Get up to take short walks every 1-2 hours. This is important to improve blood flow  and breathing. Ask for help if you feel weak or unsteady.  Return to your normal activities as told by your health care provider. Ask your health care provider what activities are safe for you. Managing cramping and bloating  Try walking around when you have cramps or feel bloated.  Apply heat to your abdomen as told by your health care provider. Use the heat source that your health care provider recommends, such as a moist heat pack or a heating pad. ? Place a towel between your skin and the heat source. ? Leave the heat on for 20-30 minutes. ? Remove the heat if your skin turns bright red. This is especially important if you are unable to feel pain, heat, or cold. You may have a greater risk of getting burned.   General instructions  If you were given a sedative during the procedure, it can affect you for several hours. Do not drive or operate machinery until your health care provider says that it is  safe.  For the first 24 hours after the procedure: ? Do not sign important documents. ? Do not drink alcohol. ? Do your regular daily activities at a slower pace than normal. ? Eat soft foods that are easy to digest.  Take over-the-counter and prescription medicines only as told by your health care provider.  Keep all follow-up visits as told by your health care provider. This is important. Contact a health care provider if:  You have blood in your stool 2-3 days after the procedure. Get help right away if you have:  More than a small spotting of blood in your stool.  Large blood clots in your stool.  Swelling of your abdomen.  Nausea or vomiting.  A fever.  Increasing pain in your abdomen that is not relieved with medicine. Summary  After the procedure, it is common to have a small amount of blood in your stool. You may also have mild cramping and bloating of your abdomen.  If you were given a sedative during the procedure, it can affect you for several hours. Do not drive or operate machinery until your health care provider says that it is safe.  Get help right away if you have a lot of blood in your stool, nausea or vomiting, a fever, or increased pain in your abdomen. This information is not intended to replace advice given to you by your health care provider. Make sure you discuss any questions you have with your health care provider. Document Revised: 08/28/2019 Document Reviewed: 03/30/2019 Elsevier Patient Education  2021 [place] After This sheet gives you information about how to care for yourself after your procedure. Your health care provider may also give you more specific instructions. If you have problems or questions, contact your health care provider. What can I expect after the procedure? After the procedure, it is common to have:  Tiredness.  Forgetfulness about what happened after the procedure.  Impaired judgment  for important decisions.  Nausea or vomiting.  Some difficulty with balance. Follow these instructions at home: For the time period you were told by your health care provider:  Rest as needed.  Do not participate in activities where you could fall or become injured.  Do not drive or use machinery.  Do not drink alcohol.  Do not take sleeping pills or medicines that cause drowsiness.  Do not make important decisions or sign legal documents.  Do not take care of children on your own.  Eating and drinking  Follow the diet that is recommended by your health care provider.  Drink enough fluid to keep your urine pale yellow.  If you vomit: ? Drink water, juice, or soup when you can drink without vomiting. ? Make sure you have little or no nausea before eating solid foods. General instructions  Have a responsible adult stay with you for the time you are told. It is important to have someone help care for you until you are awake and alert.  Take over-the-counter and prescription medicines only as told by your health care provider.  If you have sleep apnea, surgery and certain medicines can increase your risk for breathing problems. Follow instructions from your health care provider about wearing your sleep device: ? Anytime you are sleeping, including during daytime naps. ? While taking prescription pain medicines, sleeping medicines, or medicines that make you drowsy.  Avoid smoking.  Keep all follow-up visits as told by your health care provider. This is important. Contact a health care provider if:  You keep feeling nauseous or you keep vomiting.  You feel light-headed.  You are still sleepy or having trouble with balance after 24 hours.  You develop a rash.  You have a fever.  You have redness or swelling around the IV site. Get help right away if:  You have trouble breathing.  You have new-onset confusion at home. Summary  For several hours after your  procedure, you may feel tired. You may also be forgetful and have poor judgment.  Have a responsible adult stay with you for the time you are told. It is important to have someone help care for you until you are awake and alert.  Rest as told. Do not drive or operate machinery. Do not drink alcohol or take sleeping pills.  Get help right away if you have trouble breathing, or if you suddenly become confused. This information is not intended to replace advice given to you by your health care provider. Make sure you discuss any questions you have with your health care provider. Document Revised: 05/19/2020 Document Reviewed: 08/06/2019 Elsevier Patient Education  2021 [employer].

## 2020-11-10 ENCOUNTER — Encounter (HOSPITAL_COMMUNITY): Payer: Self-pay

## 2020-11-10 ENCOUNTER — Other Ambulatory Visit (HOSPITAL_COMMUNITY)
Admission: RE | Admit: 2020-11-10 | Discharge: 2020-11-10 | Disposition: A | Discharge status: Home / Self Care | Payer: Medicare HMO | Source: Ambulatory Visit | Attending: Internal Medicine | Admitting: Internal Medicine

## 2020-11-10 ENCOUNTER — Other Ambulatory Visit: Payer: Self-pay

## 2020-11-10 ENCOUNTER — Encounter (HOSPITAL_COMMUNITY)
Admission: RE | Admit: 2020-11-10 | Discharge: 2020-11-10 | Disposition: A | Discharge status: Home / Self Care | Payer: Medicare HMO | Source: Ambulatory Visit | Attending: Internal Medicine | Admitting: Internal Medicine

## 2020-11-10 DIAGNOSIS — Z01818 Encounter for other preprocedural examination: Secondary | ICD-10-CM | POA: Diagnosis not present

## 2020-11-10 DIAGNOSIS — Z20822 Contact with and (suspected) exposure to covid-19: Secondary | ICD-10-CM | POA: Insufficient documentation

## 2020-11-10 LAB — BASIC METABOLIC PANEL
Anion gap: 9 (ref 5–15)
BUN: 21 mg/dL (ref 8–23)
CO2: 26 mmol/L (ref 22–32)
Calcium: 8.6 mg/dL — ABNORMAL LOW (ref 8.9–10.3)
Chloride: 100 mmol/L (ref 98–111)
Creatinine, Ser: 1.78 mg/dL — ABNORMAL HIGH (ref 0.61–1.24)
GFR, Estimated: 40 mL/min — ABNORMAL LOW (ref >60)
Glucose, Bld: 138 mg/dL — ABNORMAL HIGH (ref 70–99)
Potassium: 4.1 mmol/L (ref 3.5–5.1)
Sodium: 135 mmol/L (ref 135–145)

## 2020-11-10 LAB — SARS CORONAVIRUS 2 (TAT 6-24 HRS): SARS Coronavirus 2: NEGATIVE

## 2020-11-11 NOTE — Progress Notes (Signed)
Patient here for PAT testing for colonoscopy on 11/14/2020.  EKG shows Atrial Fib.  Patient states that he has a history of atrial fib and is followed by the [facility] hospital in [place].  Patient is on Metoprolol and Eliquis.   Contacted office and requested records to be sent from last office visit. EKG shown to anesthesia.  We will continue with procedure unless otherwise indicated.

## 2020-11-14 ENCOUNTER — Ambulatory Visit (HOSPITAL_COMMUNITY)
Admission: RE | Admit: 2020-11-14 | Discharge: 2020-11-14 | Disposition: A | Discharge status: Home / Self Care | Payer: Medicare HMO | Attending: Internal Medicine | Admitting: Internal Medicine

## 2020-11-14 ENCOUNTER — Encounter (HOSPITAL_COMMUNITY)
Admission: RE | Disposition: A | Discharge status: Home / Self Care | Payer: Self-pay | Source: Home / Self Care | Attending: Internal Medicine

## 2020-11-14 ENCOUNTER — Ambulatory Visit (HOSPITAL_COMMUNITY): Payer: Medicare HMO | Admitting: Anesthesiology

## 2020-11-14 DIAGNOSIS — Z87891 Personal history of nicotine dependence: Secondary | ICD-10-CM | POA: Insufficient documentation

## 2020-11-14 DIAGNOSIS — Z86010 Personal history of colonic polyps: Secondary | ICD-10-CM | POA: Insufficient documentation

## 2020-11-14 DIAGNOSIS — Z9049 Acquired absence of other specified parts of digestive tract: Secondary | ICD-10-CM | POA: Diagnosis not present

## 2020-11-14 DIAGNOSIS — Z79899 Other long term (current) drug therapy: Secondary | ICD-10-CM | POA: Diagnosis not present

## 2020-11-14 DIAGNOSIS — Z7984 Long term (current) use of oral hypoglycemic drugs: Secondary | ICD-10-CM | POA: Diagnosis not present

## 2020-11-14 DIAGNOSIS — K649 Unspecified hemorrhoids: Secondary | ICD-10-CM | POA: Diagnosis not present

## 2020-11-14 DIAGNOSIS — Z1211 Encounter for screening for malignant neoplasm of colon: Secondary | ICD-10-CM | POA: Insufficient documentation

## 2020-11-14 DIAGNOSIS — K5730 Diverticulosis of large intestine without perforation or abscess without bleeding: Secondary | ICD-10-CM | POA: Insufficient documentation

## 2020-11-14 DIAGNOSIS — Z794 Long term (current) use of insulin: Secondary | ICD-10-CM | POA: Diagnosis not present

## 2020-11-14 DIAGNOSIS — I4891 Unspecified atrial fibrillation: Secondary | ICD-10-CM | POA: Diagnosis not present

## 2020-11-14 DIAGNOSIS — Z8673 Personal history of transient ischemic attack (TIA), and cerebral infarction without residual deficits: Secondary | ICD-10-CM | POA: Insufficient documentation

## 2020-11-14 DIAGNOSIS — E1122 Type 2 diabetes mellitus with diabetic chronic kidney disease: Secondary | ICD-10-CM | POA: Diagnosis not present

## 2020-11-14 HISTORY — PX: COLONOSCOPY WITH PROPOFOL: SHX5780

## 2020-11-14 LAB — GLUCOSE, CAPILLARY
Glucose-Capillary: 118 mg/dL — ABNORMAL HIGH (ref 70–99)
Glucose-Capillary: 103 mg/dL — ABNORMAL HIGH (ref 70–99)

## 2020-11-14 SURGERY — COLONOSCOPY WITH PROPOFOL
Anesthesia: General

## 2020-11-14 MED ORDER — PROPOFOL 10 MG/ML IV BOLUS
INTRAVENOUS | Status: AC
  Filled 2020-11-14: qty 40

## 2020-11-14 MED ORDER — LACTATED RINGERS IV SOLN: INTRAVENOUS | Status: DC

## 2020-11-14 MED ORDER — PROPOFOL 500 MG/50ML IV EMUL
INTRAVENOUS | Status: DC | PRN
  Administered 2020-11-14: 150 ug/kg/min via INTRAVENOUS

## 2020-11-14 MED ORDER — MIDAZOLAM HCL 2 MG/2ML IJ SOLN
INTRAMUSCULAR | Status: AC
  Filled 2020-11-14: qty 2

## 2020-11-14 MED ORDER — MIDAZOLAM HCL 2 MG/2ML IJ SOLN
INTRAMUSCULAR | Status: DC | PRN
  Administered 2020-11-14: 2 mg via INTRAVENOUS

## 2020-11-14 NOTE — Anesthesia Postprocedure Evaluation (Signed)
Anesthesia Post Note  Patient: Benjamin [name]  Procedure(s) Performed: COLONOSCOPY WITH PROPOFOL (N/A )  Patient location during evaluation: PACU Anesthesia Type: General Level of consciousness: awake, oriented, awake and alert and patient cooperative Pain management: satisfactory to patient Vital Signs Assessment: post-procedure vital signs reviewed and stable Respiratory status: spontaneous breathing, respiratory function stable and nonlabored ventilation Cardiovascular status: stable Postop Assessment: no apparent nausea or vomiting Anesthetic complications: no   No complications documented.   Last Vitals:  Vitals:   11/14/20 0753  BP: (!) 145/91  Pulse: 63  Resp: 18  Temp: 36.7 C  SpO2: 98%    Last Pain:  Vitals:   11/14/20 0753  TempSrc: Oral  PainSc: 0-No pain                 [name]

## 2020-11-14 NOTE — Addendum Note (Signed)
Addendum  created 11/14/20 1014 by Jonna Munro, CRNA   Flowsheet accepted

## 2020-11-14 NOTE — Op Note (Signed)
[facility] Patient Name: Benjamin [name] Procedure Date: 11/14/2020 8:41 AM MRN: [id number] Date of Birth: [birth year] Attending MD: Norvel Richards , MD CSN: [id number] Age: 70 Admit Type: Outpatient Procedure:                Colonoscopy Indications:              High risk colon cancer surveillance: Personal                            history of colonic polyps Providers:                Norvel Richards, MD, Gwenlyn Fudge, RN, Aram Candela Referring MD:              Medicines:                Propofol per Anesthesia Complications:            No immediate complications. Estimated Blood Loss:     Estimated blood loss: none. Procedure:                Pre-Anesthesia Assessment:                           - Prior to the procedure, a History and Physical                            was performed, and patient medications and                            allergies were reviewed. The patient's tolerance of                            previous anesthesia was also reviewed. The risks                            and benefits of the procedure and the sedation                            options and risks were discussed with the patient.                            All questions were answered, and informed consent                            was obtained. Prior Anticoagulants: The patient                            last took Eliquis (apixaban) 2 days prior to the                            procedure. ASA Grade Assessment: III - A patient  with severe systemic disease. After reviewing the                            risks and benefits, the patient was deemed in                            satisfactory condition to undergo the procedure.                           After obtaining informed consent, the colonoscope                            was passed under direct vision. Throughout the                            procedure, the patient's blood  pressure, pulse, and                            oxygen saturations were monitored continuously. The                            CF-[id number] ([id number]) scope was introduced through                            the anus and advanced to the the ileocolonic                            anastomosis. The colonoscopy was performed without                            difficulty. The patient tolerated the procedure                            well. The quality of the bowel preparation was                            adequate. Scope In: 8:44:46 AM Scope Out: 8:57:10 AM Scope Withdrawal Time: 0 hours 10 minutes 3 seconds  Total Procedure Duration: 0 hours 12 minutes 24 seconds  Findings:      The perianal and digital rectal examinations were normal.      Scattered small and large-mouthed diverticula were found in the entire       colon. Patient had internal hemorrhoids and prominent anal papilla.      The exam was otherwise without abnormality on direct and retroflexion       views. Impression:               - Diverticulosis in the entire examined colon.                            Hemorrhoids anal papilla.                           - The examination was otherwise normal on direct  and retroflexion views.                           - No specimens collected. Moderate Sedation:      Moderate (conscious) sedation was personally administered by an       anesthesia professional. The following parameters were monitored: oxygen       saturation, heart rate, blood pressure, respiratory rate, EKG, adequacy       of pulmonary ventilation, and response to care. Recommendation:           - Patient has a contact number available for                            emergencies. The signs and symptoms of potential                            delayed complications were discussed with the                            patient. Return to normal activities tomorrow.                            Written discharge  instructions were provided to the                            patient.                           - Resume previous diet.                           - Continue present medications. Resume Eliquis today                           - Repeat colonoscopy in 5 years for surveillance if                            overall health permits.                           - Return to GI office (date not yet determined). Procedure Code(s):        --- Professional ---                           [phone], Colonoscopy, flexible; diagnostic, including                            collection of specimen(s) by brushing or washing,                            when performed (separate procedure) Diagnosis Code(s):        --- Professional ---                           Z86.010, Personal history of colonic polyps  K57.30, Diverticulosis of large intestine without                            perforation or abscess without bleeding CPT copyright 2019 American Medical Association. All rights reserved. The codes documented in this report are preliminary and upon coder review may  be revised to meet current compliance requirements. Cristopher Estimable. Serenah Mill, MD Norvel Richards, MD 11/14/2020 9:07:44 AM This report has been signed electronically. Number of Addenda: 0

## 2020-11-14 NOTE — Discharge Instructions (Signed)
Colonoscopy Discharge Instructions  Read the instructions outlined below and refer to this sheet in the next few weeks. These discharge instructions provide you with general information on caring for yourself after you leave the hospital. Your doctor may also give you specific instructions. While your treatment has been planned according to the most current medical practices available, unavoidable complications occasionally occur. If you have any problems or questions after discharge, call Dr. [name] at [phone]. ACTIVITY  You may resume your regular activity, but move at a slower pace for the next 24 hours.   Take frequent rest periods for the next 24 hours.   Walking will help get rid of the air and reduce the bloated feeling in your belly (abdomen).   No driving for 24 hours (because of the medicine (anesthesia) used during the test).    Do not sign any important legal documents or operate any machinery for 24 hours (because of the anesthesia used during the test).  NUTRITION  Drink plenty of fluids.   You may resume your normal diet as instructed by your doctor.   Begin with a light meal and progress to your normal diet. Heavy or fried foods are harder to digest and may make you feel sick to your stomach (nauseated).   Avoid alcoholic beverages for 24 hours or as instructed.  MEDICATIONS  You may resume your normal medications unless your doctor tells you otherwise.  WHAT YOU CAN EXPECT TODAY  Some feelings of bloating in the abdomen.   Passage of more gas than usual.   Spotting of blood in your stool or on the toilet paper.  IF YOU HAD POLYPS REMOVED DURING THE COLONOSCOPY:  No aspirin products for 7 days or as instructed.   No alcohol for 7 days or as instructed.   Eat a soft diet for the next 24 hours.  FINDING OUT THE RESULTS OF YOUR TEST Not all test results are available during your visit. If your test results are not back during the visit, make an appointment  with your caregiver to find out the results. Do not assume everything is normal if you have not heard from your caregiver or the medical facility. It is important for you to follow up on all of your test results.  SEEK IMMEDIATE MEDICAL ATTENTION IF:  You have more than a spotting of blood in your stool.   Your belly is swollen (abdominal distention).   You are nauseated or vomiting.   You have a temperature over 101.   You have abdominal pain or discomfort that is severe or gets worse throughout the day.    Diverticulosis found in your colon.  No polyps found today  Recommend 1 more colonoscopy in 5 years if your overall health permits  Resume Eliquis today  At patient request, I called Dyshawn Cangelosi at [phone] -unable to reach.     Diverticulosis  Diverticulosis is a condition that develops when small pouches (diverticula) form in the wall of the large intestine (colon). The colon is where water is absorbed and stool (feces) is formed. The pouches form when the inside layer of the colon pushes through weak spots in the outer layers of the colon. You may have a few pouches or many of them. The pouches usually do not cause problems unless they become inflamed or infected. When this happens, the condition is called diverticulitis. What are the causes? The cause of this condition is not known. What increases the risk? The following factors may make  you more likely to develop this condition:  Being older than age 40. Your risk for this condition increases with age. Diverticulosis is rare among people younger than age 61. By age 24, many people have it.  Eating a low-fiber diet.  Having frequent constipation.  Being overweight.  Not getting enough exercise.  Smoking.  Taking over-the-counter pain medicines, like aspirin and ibuprofen.  Having a family history of diverticulosis. What are the signs or symptoms? In most people, there are no symptoms of this condition.  If you do have symptoms, they may include:  Bloating.  Cramps in the abdomen.  Constipation or diarrhea.  Pain in the lower left side of the abdomen. How is this diagnosed? Because diverticulosis usually has no symptoms, it is most often diagnosed during an exam for other colon problems. The condition may be diagnosed by:  Using a flexible scope to examine the colon (colonoscopy).  Taking an X-ray of the colon after dye has been put into the colon (barium enema).  Having a CT scan. How is this treated? You may not need treatment for this condition. Your health care provider may recommend treatment to prevent problems. You may need treatment if you have symptoms or if you previously had diverticulitis. Treatment may include:  Eating a high-fiber diet.  Taking a fiber supplement.  Taking a live bacteria supplement (probiotic).  Taking medicine to relax your colon.   Follow these instructions at home: Medicines  Take over-the-counter and prescription medicines only as told by your health care provider.  If told by your health care provider, take a fiber supplement or probiotic. Constipation prevention Your condition may cause constipation. To prevent or treat constipation, you may need to:  Drink enough fluid to keep your urine pale yellow.  Take over-the-counter or prescription medicines.  Eat foods that are high in fiber, such as beans, whole grains, and fresh fruits and vegetables.  Limit foods that are high in fat and processed sugars, such as fried or sweet foods.   General instructions  Try not to strain when you have a bowel movement.  Keep all follow-up visits as told by your health care provider. This is important. Contact a health care provider if you:  Have pain in your abdomen.  Have bloating.  Have cramps.  Have not had a bowel movement in 3 days. Get help right away if:  Your pain gets worse.  Your bloating becomes very bad.  You have a fever  or chills, and your symptoms suddenly get worse.  You vomit.  You have bowel movements that are bloody or black.  You have bleeding from your rectum. Summary  Diverticulosis is a condition that develops when small pouches (diverticula) form in the wall of the large intestine (colon).  You may have a few pouches or many of them.  This condition is most often diagnosed during an exam for other colon problems.  Treatment may include increasing the fiber in your diet, taking supplements, or taking medicines. This information is not intended to replace advice given to you by your health care provider. Make sure you discuss any questions you have with your health care provider. Document Revised: 04/02/2019 Document Reviewed: 04/02/2019 Elsevier Patient Education  2021 [place] After This sheet gives you information about how to care for yourself after your procedure. Your health care provider may also give you more specific instructions. If you have problems or questions, contact your health care  provider. What can I expect after the procedure? After the procedure, it is common to have:  Tiredness.  Forgetfulness about what happened after the procedure.  Impaired judgment for important decisions.  Nausea or vomiting.  Some difficulty with balance. Follow these instructions at home: For the time period you were told by your health care provider:  Rest as needed.  Do not participate in activities where you could fall or become injured.  Do not drive or use machinery.  Do not drink alcohol.  Do not take sleeping pills or medicines that cause drowsiness.  Do not make important decisions or sign legal documents.  Do not take care of children on your own.      Eating and drinking  Follow the diet that is recommended by your health care provider.  Drink enough fluid to keep your urine pale yellow.  If you vomit: ? Drink water,  juice, or soup when you can drink without vomiting. ? Make sure you have little or no nausea before eating solid foods. General instructions  Have a responsible adult stay with you for the time you are told. It is important to have someone help care for you until you are awake and alert.  Take over-the-counter and prescription medicines only as told by your health care provider.  If you have sleep apnea, surgery and certain medicines can increase your risk for breathing problems. Follow instructions from your health care provider about wearing your sleep device: ? Anytime you are sleeping, including during daytime naps. ? While taking prescription pain medicines, sleeping medicines, or medicines that make you drowsy.  Avoid smoking.  Keep all follow-up visits as told by your health care provider. This is important. Contact a health care provider if:  You keep feeling nauseous or you keep vomiting.  You feel light-headed.  You are still sleepy or having trouble with balance after 24 hours.  You develop a rash.  You have a fever.  You have redness or swelling around the IV site. Get help right away if:  You have trouble breathing.  You have new-onset confusion at home. Summary  For several hours after your procedure, you may feel tired. You may also be forgetful and have poor judgment.  Have a responsible adult stay with you for the time you are told. It is important to have someone help care for you until you are awake and alert.  Rest as told. Do not drive or operate machinery. Do not drink alcohol or take sleeping pills.  Get help right away if you have trouble breathing, or if you suddenly become confused. This information is not intended to replace advice given to you by your health care provider. Make sure you discuss any questions you have with your health care provider. Document Revised: 05/19/2020 Document Reviewed: 08/06/2019 Elsevier Patient Education  2021  [employer].

## 2020-11-14 NOTE — Anesthesia Preprocedure Evaluation (Addendum)
Anesthesia Evaluation  Patient identified by MRN, date of birth, ID band Patient awake    Reviewed: Allergy & Precautions, NPO status , Patient's Chart, lab work & pertinent test results  History of Anesthesia Complications Negative for: history of anesthetic complications  Airway Mallampati: II  TM Distance: >3 FB Neck ROM: Full    Dental  (+) Missing, Loose, Dental Advisory Given, Poor Dentition,    Pulmonary sleep apnea , former smoker,    Pulmonary exam normal breath sounds clear to auscultation       Cardiovascular Exercise Tolerance: Good hypertension, Pt. on medications Normal cardiovascular exam+ dysrhythmias Atrial Fibrillation  Rhythm:Regular Rate:Normal     Neuro/Psych CVA negative psych ROS   GI/Hepatic negative GI ROS, Neg liver ROS,   Endo/Other  diabetes, Well Controlled, Type 2, Insulin Dependent  Renal/GU Renal disease     Musculoskeletal  (+) Arthritis , Osteoarthritis,    Abdominal   Peds  Hematology negative hematology ROS (+)   Anesthesia Other Findings   Reproductive/Obstetrics                            Anesthesia Physical Anesthesia Plan  ASA: III  Anesthesia Plan: General   Post-op Pain Management:    Induction: Intravenous  PONV Risk Score and Plan: TIVA  Airway Management Planned: Nasal Cannula and Natural Airway  Additional Equipment:   Intra-op Plan:   Post-operative Plan:   Informed Consent: I have reviewed the patients History and Physical, chart, labs and discussed the procedure including the risks, benefits and alternatives for the proposed anesthesia with the patient or authorized representative who has indicated his/her understanding and acceptance.     Dental advisory given  Plan Discussed with: CRNA and Surgeon  Anesthesia Plan Comments:         Anesthesia Quick Evaluation

## 2020-11-14 NOTE — Transfer of Care (Signed)
Immediate Anesthesia Transfer of Care Note  Patient: Benjamin [name]  Procedure(s) Performed: COLONOSCOPY WITH PROPOFOL (N/A )  Patient Location: PACU  Anesthesia Type:General  Level of Consciousness: awake, alert , oriented and patient cooperative  Airway & Oxygen Therapy: Patient Spontanous Breathing and Patient connected to nasal cannula oxygen  Post-op Assessment: Report given to RN, Post -op Vital signs reviewed and stable and Patient moving all extremities X 4  Post vital signs: Reviewed and stable  Last Vitals:  Vitals Value Taken Time  BP    Temp    Pulse 71 11/14/20 0902  Resp 6 11/14/20 0902  SpO2 98 % 11/14/20 0902  Vitals shown include unvalidated device data.  Last Pain:  Vitals:   11/14/20 0753  TempSrc: Oral  PainSc: 0-No pain      Patients Stated Pain Goal: 8 (25/74/93 5521)  Complications: No complications documented.

## 2020-11-14 NOTE — H&P (Signed)
@LOGO @   Primary Care Physician:  [facility] Primary Gastroenterologist:  Dr. [name]  Pre-Procedure History & Physical: HPI:  Benjamin [name] is a 70 y.o. male here for surveillance colonoscopy. History multiple colonic adenomas removed over time. Large colon adenoma and on the right side required right hemicolectomy Dr. Dalbert Batman previously negative colonoscopy 5 years ago. History of CVA with good neurologic recovery. Eliquis not on his list of medications at his November visit but he has been taking it.  Was held 2 days ago.  No bowel symptoms currently. Blood sugar 118 this morning.  Past Medical History:  Diagnosis Date  . Arthritis   . Essential hypertension   . Gout   . History of stroke    Right occipital May 2018  . Hypertensive heart disease    LVH with grade 2 diastolic dysfunction  . Mixed hyperlipidemia    Statin intolerance  . Rotator cuff tear    Chronic pain  . Sleep apnea    a. intolerant to CPAP  . Stroke ([place]) 10/1998   TIA  . Tubular adenoma   . Type 2 diabetes mellitus ([place])     Past Surgical History:  Procedure Laterality Date  . APPENDECTOMY    . COLONOSCOPY N/A 10/29/2013   Dr. [name]- sigmoid polyp status post cold snare removal. large sessile ascending colon polyp. debulked with saline assisted snare polypectomy . pancolonic diverticulosis. inadequate prep. tubular adenoma on bx  . COLONOSCOPY N/A 02/22/2014   Dr. [name]: Saline-assisted snare polypectomy/biopsy for sprawling carpet polyp in the ascending colon which could not be completely removed. Status post biopsy (tubular adenoma), tattooed  . COLONOSCOPY N/A 03/01/2014   GUR:KYHC polypectomy hemorrhage s/p bleeding  . COLONOSCOPY N/A 06/08/2015   Procedure: COLONOSCOPY;  Surgeon: [name], MD;  [facility];  Service: Endoscopy;  Laterality: N/A;  0900  . ESOPHAGOGASTRODUODENOSCOPY N/A 02/22/2014   Dr. [name]: Erosive reflux esophagitis. Schatzki ring status post  dilation. Gastric and duodenal erosions with benign biopsies.  [name] LAPAROSCOPIC RIGHT COLECTOMY N/A 04/16/2014   Procedure: LAPAROSCOPIC ASSISTED RIGHT COLECTOMY;  Surgeon: [name], MD;  Location: [facility];  Service: General;  Laterality: N/A;  . [name] DILATION N/A 02/22/2014   Procedure: [name];  Surgeon: [name], MD;  [facility];  Service: Endoscopy;  Laterality: N/A;  . SAVORY DILATION N/A 02/22/2014   Procedure: SAVORY DILATION;  Surgeon: [name], MD;  [facility];  Service: Endoscopy;  Laterality: N/A;    Prior to Admission medications   Medication Sig Start Date End Date Taking? Authorizing Provider  acetaminophen (TYLENOL) 650 MG CR tablet Take 650 mg by mouth as needed for pain.    Yes [provider]  allopurinol (ZYLOPRIM) 100 MG tablet Take 100 mg by mouth daily.   Yes [provider]  amLODipine (NORVASC) 10 MG tablet Take 1 tablet (10 mg total) by mouth daily. 02/09/16  Yes Lendon Colonel, NP  apixaban (ELIQUIS) 5 MG TABS tablet Take 5 mg by mouth 2 (two) times daily.   Yes [provider]  cholecalciferol (VITAMIN D3) 25 MCG (1000 UNIT) tablet Take 1,000 Units by mouth daily.   Yes [provider]  empagliflozin (JARDIANCE) 25 MG TABS tablet Take 12.5 mg by mouth daily. Half tablet daily   Yes [provider]  insulin glargine (LANTUS) 100 UNIT/ML injection Inject 54 Units into the skin daily. IN THE MORNING   Yes [provider]  losartan (COZAAR) 100 MG tablet Take 1 tablet (100 mg total) by mouth daily. 02/09/16  Yes Lendon Colonel, NP  methocarbamol (ROBAXIN) 500 MG tablet Take 500 mg by mouth daily as needed for muscle spasms.    Yes [provider]  metoprolol tartrate (LOPRESSOR) 50 MG tablet Take 75 mg by mouth 2 (two) times daily.   Yes [provider]  pantoprazole (PROTONIX) 40 MG tablet Take 1 tablet (40 mg total) by mouth daily. Patient  taking differently: Take 40 mg by mouth in the morning and at bedtime. 09/13/15  Yes Annitta Needs, NP  potassium chloride SA (K-DUR,KLOR-CON) 20 MEQ tablet Take 1 tablet (20 mEq total) by mouth daily. 02/09/16  Yes Lendon Colonel, NP  Semaglutide, 1 MG/DOSE, 2 MG/1.5ML SOPN Inject 1 mg into the skin every Wednesday.    Yes [provider]  tamsulosin (FLOMAX) 0.4 MG CAPS capsule Take 0.4 mg by mouth daily.   Yes [provider]  timolol (TIMOPTIC) 0.5 % ophthalmic solution Place 1 drop into the left eye 2 (two) times daily. 10/27/20  Yes [provider]  traMADol (ULTRAM) 50 MG tablet Take 50 mg by mouth daily as needed.    Yes [provider]    Allergies as of 08/17/2020 - Review Complete 08/16/2020  Allergen Reaction Noted  . Niacin-lovastatin er Other (See Comments) 10/18/2011  . Statins  04/09/2014  . Zocor [simvastatin - high dose] Other (See Comments) 10/18/2011    Family History  Problem Relation Age of Onset  . Diabetes type II Mother   . Colon cancer Neg Hx     Social History   Socioeconomic History  . Marital status: Married    Spouse name: Not on file  . Number of children: 2  . Years of education: Not on file  . Highest education level: Not on file  Occupational History  . Occupation: Programmer, systems: [employer]   Tobacco Use  . Smoking status: Former Smoker    Types: Cigarettes    Start date: 09/17/1961    Quit date: 09/18/1975    Years since quitting: 45.1  . Smokeless tobacco: Never Used  . Tobacco comment: quit 35 years ago  Vaping Use  . Vaping Use: Never used  Substance and Sexual Activity  . Alcohol use: No    Alcohol/week: 0.0 standard drinks    Comment: None in over a year. prior to that 1-2 beers couple times per week.  . Drug use: No  . Sexual activity: Not on file  Other Topics Concern  . Not on file  Social History Narrative  . Not on file   Social Determinants of Health   Financial Resource  Strain: Not on file  Food Insecurity: Not on file  Transportation Needs: Not on file  Physical Activity: Not on file  Stress: Not on file  Social Connections: Not on file  Intimate Partner Violence: Not on file    Review of Systems: See HPI, otherwise negative ROS  Physical Exam: BP (!) 145/91   Pulse 63   Temp 98.1 F (36.7 C) (Oral)   Resp 18   Ht 6\' 2"  (1.88 m)   Wt 122.5 kg   SpO2 98%   BMI 34.67 kg/m  General:   Alert,  Well-developed, well-nourished, pleasant and cooperative in NAD Neck:  Supple; no masses or thyromegaly. No significant cervical adenopathy. Lungs:  Clear throughout to auscultation.   No wheezes, crackles, or  rhonchi. No acute distress. Heart:  Regular rate and rhythm; no murmurs, clicks, rubs,  or gallops. Abdomen: Non-distended, normal bowel sounds.  Soft and nontender without appreciable mass or hepatosplenomegaly.  Pulses:  Normal pulses noted. Extremities:  Without clubbing or edema.  Impression/Plan: 70 year old gentleman history of multiple colonic adenomas removed over time.  Here for surveillance examination.  Eliquis held. The risks, benefits, limitations, alternatives and imponderables have been reviewed with the patient. Questions have been answered. All parties are agreeable.      Notice: This dictation was prepared with Dragon dictation along with smaller phrase technology. Any transcriptional errors that result from this process are unintentional and may not be corrected upon review.

## 2020-11-17 ENCOUNTER — Encounter (HOSPITAL_COMMUNITY): Payer: Self-pay | Admitting: Internal Medicine

## 2021-01-13 DIAGNOSIS — I509 Heart failure, unspecified: Secondary | ICD-10-CM | POA: Insufficient documentation

## 2021-01-13 DIAGNOSIS — K219 Gastro-esophageal reflux disease without esophagitis: Secondary | ICD-10-CM | POA: Insufficient documentation

## 2021-01-13 DIAGNOSIS — N401 Enlarged prostate with lower urinary tract symptoms: Secondary | ICD-10-CM | POA: Insufficient documentation

## 2021-01-17 DIAGNOSIS — H402223 Chronic angle-closure glaucoma, left eye, severe stage: Secondary | ICD-10-CM | POA: Insufficient documentation

## 2022-01-10 ENCOUNTER — Encounter (HOSPITAL_BASED_OUTPATIENT_CLINIC_OR_DEPARTMENT_OTHER): Payer: Self-pay

## 2022-01-10 DIAGNOSIS — G4730 Sleep apnea, unspecified: Secondary | ICD-10-CM

## 2022-02-16 ENCOUNTER — Ambulatory Visit: Payer: Medicare HMO | Attending: Family | Admitting: Neurology

## 2022-02-16 DIAGNOSIS — G4730 Sleep apnea, unspecified: Secondary | ICD-10-CM | POA: Insufficient documentation

## 2022-02-16 DIAGNOSIS — I4891 Unspecified atrial fibrillation: Secondary | ICD-10-CM | POA: Diagnosis not present

## 2022-02-16 DIAGNOSIS — G4739 Other sleep apnea: Secondary | ICD-10-CM | POA: Insufficient documentation

## 2022-02-16 DIAGNOSIS — G478 Other sleep disorders: Secondary | ICD-10-CM | POA: Insufficient documentation

## 2022-02-16 DIAGNOSIS — G4733 Obstructive sleep apnea (adult) (pediatric): Secondary | ICD-10-CM | POA: Diagnosis not present

## 2022-02-17 NOTE — Procedures (Signed)
[place] A. Merlene Laughter, MD     [email or web address]             NOCTURNAL POLYSOMNOGRAPHY   LOCATION: [facility]  Patient Name: Benjamin [name], Benjamin [name] Date: 02/16/2022 Gender: Male D.O.B: July 21, 1951 Age (years): 70 Referring Provider: Alice Reichert Height (inches): 73 Interpreting Physician: Phillips Odor MD, ABSM Weight (lbs): 264 RPSGT: Peak, Robert BMI: 35 MRN: [id number] Neck Size: CLINICAL INFORMATION The patient is referred for a BiPAP titration to treat sleep apnea.      SLEEP STUDY TECHNIQUE As per the AASM Manual for the Scoring of Sleep and Associated Events v2.3 (April 2016) with a hypopnea requiring 4% desaturations.  The channels recorded and monitored were frontal, central and occipital EEG, electrooculogram (EOG), submentalis EMG (chin), nasal and oral airflow, thoracic and abdominal wall motion, anterior tibialis EMG, snore microphone, electrocardiogram, and pulse oximetry. Bilevel positive airway pressure (BPAP) was initiated at the beginning of the study and titrated to treat sleep-disordered breathing.  MEDICATIONS Medications self-administered by patient taken the night of the study : N/A  Current Outpatient Medications:    acetaminophen (TYLENOL) 650 MG CR tablet, Take 650 mg by mouth as needed for pain. , Disp: , Rfl:    allopurinol (ZYLOPRIM) 100 MG tablet, Take 100 mg by mouth daily., Disp: , Rfl:    amLODipine (NORVASC) 10 MG tablet, Take 1 tablet (10 mg total) by mouth daily., Disp: 30 tablet, Rfl: 6   apixaban (ELIQUIS) 5 MG TABS tablet, Take 5 mg by mouth 2 (two) times daily., Disp: , Rfl:    cholecalciferol (VITAMIN D3) 25 MCG (1000 UNIT) tablet, Take 1,000 Units by mouth daily., Disp: , Rfl:    empagliflozin (JARDIANCE) 25 MG TABS tablet, Take 12.5 mg by mouth daily. Half tablet daily, Disp: , Rfl:    insulin glargine (LANTUS) 100 UNIT/ML injection, Inject 54 Units into the skin daily. IN THE MORNING, Disp: , Rfl:    losartan  (COZAAR) 100 MG tablet, Take 1 tablet (100 mg total) by mouth daily., Disp: 30 tablet, Rfl: 6   methocarbamol (ROBAXIN) 500 MG tablet, Take 500 mg by mouth daily as needed for muscle spasms. , Disp: , Rfl:    metoprolol tartrate (LOPRESSOR) 50 MG tablet, Take 75 mg by mouth 2 (two) times daily., Disp: , Rfl:    pantoprazole (PROTONIX) 40 MG tablet, Take 1 tablet (40 mg total) by mouth daily. (Patient taking differently: Take 40 mg by mouth in the morning and at bedtime.), Disp: 30 tablet, Rfl: 11   potassium chloride SA (K-DUR,KLOR-CON) 20 MEQ tablet, Take 1 tablet (20 mEq total) by mouth daily., Disp: 30 tablet, Rfl: 6   Semaglutide, 1 MG/DOSE, 2 MG/1.5ML SOPN, Inject 1 mg into the skin every Wednesday. , Disp: , Rfl:    tamsulosin (FLOMAX) 0.4 MG CAPS capsule, Take 0.4 mg by mouth daily., Disp: , Rfl:    timolol (TIMOPTIC) 0.5 % ophthalmic solution, Place 1 drop into the left eye 2 (two) times daily., Disp: , Rfl:    traMADol (ULTRAM) 50 MG tablet, Take 50 mg by mouth daily as needed. , Disp: , Rfl:    RESPIRATORY PARAMETERS Optimal IPAP Pressure (cm): 13 AHI at Optimal Pressure (/hr) 4 Optimal EPAP Pressure (cm): 9   Overall Minimal O2 (%): 84.00 Minimal O2 at Optimal Pressure (%): 89.0 SLEEP ARCHITECTURE Start Time: 10:05:52 PM Stop Time: 5:42:27 AM Total Time (min): 456.6 Total Sleep Time (min): 186 Sleep Latency (min): 36.8 Sleep Efficiency (%): 40.7 REM Latency (min):  53.0 WASO (min): 233.8 Stage N1 (%): 2.42 Stage N2 (%): 75.54 Stage N3 (%): 2.69 Stage R (%): 19.4 Supine (%): 0.00 Arousal Index (/hr): 6.5     CARDIAC DATA The 2 lead EKG demonstrated atrial fibrillation. The mean heart rate was 58.56 beats per minute. Other EKG findings include: None.  LEG MOVEMENT DATA The total Periodic Limb Movements of Sleep (PLMS) were 0. The PLMS index was 0.00. A PLMS index of <15 is considered normal in adults.  IMPRESSIONS The optimal BIPAP selected for this patient are ( 13 / 9 cm of  water). Poor sleep efficiency was also noted.   Delano Metz, MD Diplomate, American Board of Sleep Medicine.  ELECTRONICALLY SIGNED ON:  02/17/2022, 8:49 PM [place] PH: [phone]   FX: [phone] [place]

## 2022-04-09 DIAGNOSIS — G4733 Obstructive sleep apnea (adult) (pediatric): Secondary | ICD-10-CM | POA: Diagnosis not present

## 2022-04-09 NOTE — Progress Notes (Unsigned)
Cardiology Office Note  Date: 04/11/2022   ID: Benjamin, [name] 05/10/1951, MRN [id number]  PCP:  [facility]  Cardiologist:  Rozann Lesches, MD Electrophysiologist:  None   Chief Complaint  Patient presents with   Cardiac follow-up    History of Present Illness: Benjamin [name] is a 71 y.o. male last seen in February 2020.  He has had interval follow-up with the [facility] system.  He is here today with his wife for a follow-up visit.  Reports having trouble with relatively low blood pressures in the last several weeks, was taken off Lotensin and Norvasc by his PCP.  I reviewed home blood pressure and heart rate checks.  He has not had any significant palpitations or increased heart rates in atrial fibrillation which is a new interval diagnosis since our last encounter.  CHA2DS2-VASc score is 6 and he is on Eliquis for stroke prophylaxis.  Also on Lopressor at 75 mg twice daily.  I personally reviewed his ECG from February 2022 showing rate controlled atrial fibrillation.  Tracing today shows atrial fibrillation with right bundle branch block with heart rate in the 60s.  His last echocardiogram was in 2018 at which point LVEF was 60 to 65%.  He has not had an interval study.   Past Medical History:  Diagnosis Date   Arthritis    Essential hypertension    Gout    History of stroke    Right occipital May 2018   Hypertensive heart disease    LVH with grade 2 diastolic dysfunction   Mixed hyperlipidemia    Statin intolerance   Rotator cuff tear    Chronic pain   Sleep apnea    a. intolerant to CPAP   Stroke ([place]) 10/1998   TIA   Tubular adenoma    Type 2 diabetes mellitus ([place])     Past Surgical History:  Procedure Laterality Date   APPENDECTOMY     COLONOSCOPY N/A 10/29/2013   Dr. [name]- sigmoid polyp status post cold snare removal. large sessile ascending colon polyp. debulked with saline assisted snare polypectomy . pancolonic diverticulosis.  inadequate prep. tubular adenoma on bx   COLONOSCOPY N/A 02/22/2014   Dr. [name]: Saline-assisted snare polypectomy/biopsy for sprawling carpet polyp in the ascending colon which could not be completely removed. Status post biopsy (tubular adenoma), tattooed   COLONOSCOPY N/A 03/01/2014   ZDG:LOVF polypectomy hemorrhage s/p bleeding   COLONOSCOPY N/A 06/08/2015   Procedure: COLONOSCOPY;  Surgeon: [name], MD;  [facility];  Service: Endoscopy;  Laterality: N/A;  0900   COLONOSCOPY WITH PROPOFOL N/A 11/14/2020   Procedure: COLONOSCOPY WITH PROPOFOL;  Surgeon: [name], MD;  [facility];  Service: Endoscopy;  Laterality: N/A;  8:15am   ESOPHAGOGASTRODUODENOSCOPY N/A 02/22/2014   Dr. [name]: Erosive reflux esophagitis. Schatzki ring status post dilation. Gastric and duodenal erosions with benign biopsies.   LAPAROSCOPIC RIGHT COLECTOMY N/A 04/16/2014   Procedure: LAPAROSCOPIC ASSISTED RIGHT COLECTOMY;  Surgeon: [name], MD;  Location: [place];  Service: General;  Laterality: N/A;   MALONEY DILATION N/A 02/22/2014   Procedure: [name];  Surgeon: [name], MD;  [facility];  Service: Endoscopy;  Laterality: N/A;   SAVORY DILATION N/A 02/22/2014   Procedure: SAVORY DILATION;  Surgeon: [name], MD;  [facility];  Service: Endoscopy;  Laterality: N/A;    Current Outpatient Medications  Medication Sig Dispense Refill   acetaminophen (  TYLENOL) 650 MG CR tablet Take 650 mg by mouth as needed for pain.      allopurinol (ZYLOPRIM) 100 MG tablet Take 100 mg by mouth daily.     apixaban (ELIQUIS) 5 MG TABS tablet Take 5 mg by mouth 2 (two) times daily.     cetirizine (ZYRTEC) 10 MG tablet Take 10 mg by mouth daily.     cholecalciferol (VITAMIN D3) 25 MCG (1000 UNIT) tablet Take 1,000 Units by mouth daily.     DULoxetine (CYMBALTA) 30 MG capsule Take 30 mg by mouth daily.     empagliflozin (JARDIANCE) 25 MG TABS tablet Take 12.5  mg by mouth daily. Half tablet daily     insulin glargine (LANTUS) 100 UNIT/ML injection Inject 54 Units into the skin daily. IN THE MORNING     metoprolol tartrate (LOPRESSOR) 50 MG tablet Take 1 tablet (50 mg total) by mouth 2 (two) times daily. 180 tablet 3   pantoprazole (PROTONIX) 40 MG tablet Take 1 tablet (40 mg total) by mouth daily. (Patient taking differently: Take 40 mg by mouth in the morning and at bedtime.) 30 tablet 11   Semaglutide, 1 MG/DOSE, 2 MG/1.5ML SOPN Inject 1 mg into the skin every Wednesday.      tamsulosin (FLOMAX) 0.4 MG CAPS capsule Take 0.4 mg by mouth daily.     timolol (TIMOPTIC) 0.5 % ophthalmic solution Place 1 drop into the left eye 2 (two) times daily.     traMADol (ULTRAM) 50 MG tablet Take 50 mg by mouth daily as needed.  (Patient not taking: Reported on 04/11/2022)     No current facility-administered medications for this visit.   Allergies:  Niacin-lovastatin er, Statins, and Zocor [simvastatin - high dose]   ROS: No orthopnea or PND.  Physical Exam: VS:  BP 126/66   Pulse 63   Ht '6\' 2"'$  (1.88 m)   Wt 254 lb 9.6 oz (115.5 kg)   SpO2 97%   BMI 32.69 kg/m , BMI Body mass index is 32.69 kg/m.  Wt Readings from Last 3 Encounters:  04/11/22 254 lb 9.6 oz (115.5 kg)  11/14/20 270 lb (122.5 kg)  08/16/20 259 lb 6.4 oz (117.7 kg)    General: Patient appears comfortable at rest. HEENT: Conjunctiva and lids normal, oropharynx clear. Neck: Supple, no elevated JVP or carotid bruits, no thyromegaly. Lungs: Clear to auscultation, nonlabored breathing at rest. Cardiac: Irregularly irregular, no S3 or significant systolic murmur, no pericardial rub. Extremities: No pitting edema.  ECG:  An ECG dated 11/10/2020 was personally reviewed today and demonstrated:  Atrial fibrillation with incomplete right bundle branch block.  Recent Labwork:    Component Value Date/Time   CHOL 180 01/22/2017 0506   TRIG 391 (H) 01/22/2017 0506   HDL 25 (L) 01/22/2017 0506    CHOLHDL 7.2 01/22/2017 0506   VLDL 78 (H) 01/22/2017 0506   LDLCALC 77 01/22/2017 0506  April 2022: Potassium 4.7, BUN 27, creatinine 1.6  Other Studies Reviewed Today:  Echocardiogram 01/23/2017: - Left ventricle: The cavity size was normal. Wall thickness was    increased in a pattern of mild LVH. Systolic function was normal.    The estimated ejection fraction was in the range of 60% to 65%.    Wall motion was normal; there were no regional wall motion    abnormalities. The study is not technically sufficient to allow    evaluation of LV diastolic function.  - Aortic valve: Moderately calcified annulus. Trileaflet;    moderately  thickened leaflets. Valve area (VTI): 2.45 cm^2. Valve    area (Vmax): 2.29 cm^2.  - Technically difficult study.   Assessment and Plan:  1.  History of hypertension, blood pressure low normal recently with systolics around 175-102.  Today's blood pressure looks better.  Agree with recent discontinuation of Lotensin and Norvasc.  He has good heart rate control in atrial fibrillation, we will attempt to reduce metoprolol to 50 mg twice daily as well.  Continue to track blood pressure at home.  2.  Persistent/permanent atrial fibrillation, new diagnosis since our last encounter but seems to be fairly longstanding.  CHA2DS2-VASc score is 6.  He is on Eliquis for stroke prophylaxis.  Decrease metoprolol to 50 mg twice daily for now.  Also check updated echocardiogram to ensure no change in cardiac structure and function.  Medication Adjustments/Labs and Tests Ordered: Current medicines are reviewed at length with the patient today.  Concerns regarding medicines are outlined above.   Tests Ordered: Orders Placed This Encounter  Procedures   EKG 12-Lead   ECHOCARDIOGRAM COMPLETE    Medication Changes: Meds ordered this encounter  Medications   metoprolol tartrate (LOPRESSOR) 50 MG tablet    Sig: Take 1 tablet (50 mg total) by mouth 2 (two) times daily.     Dispense:  180 tablet    Refill:  3    04/11/22 decreased to 50 mg bid    Disposition:  Follow up  test results.  Signed, Satira Sark, MD, [facility] 04/11/2022 11:59 AM    [place] [facility] [place]. [address], [place], [place] [address] Phone: [phone]; Fax: [phone]

## 2022-04-11 ENCOUNTER — Ambulatory Visit: Payer: Medicare HMO | Admitting: Cardiology

## 2022-04-11 ENCOUNTER — Encounter: Payer: Self-pay | Admitting: Cardiology

## 2022-04-11 VITALS — BP 126/66 | HR 63 | Ht 74.0 in | Wt 254.6 lb

## 2022-04-11 DIAGNOSIS — I4821 Permanent atrial fibrillation: Secondary | ICD-10-CM | POA: Diagnosis not present

## 2022-04-11 DIAGNOSIS — I10 Essential (primary) hypertension: Secondary | ICD-10-CM

## 2022-04-11 DIAGNOSIS — I4891 Unspecified atrial fibrillation: Secondary | ICD-10-CM

## 2022-04-11 MED ORDER — METOPROLOL TARTRATE 50 MG PO TABS: 50.0000 mg | ORAL_TABLET | Freq: Two times a day (BID) | ORAL | 3 refills | Status: DC

## 2022-04-11 NOTE — Patient Instructions (Addendum)
Medication Instructions:  DECREASE lopressor to 50 mg twice a day   Labwork: None today  Testing/Procedures: Your physician has requested that you have an echocardiogram. Echocardiography is a painless test that uses sound waves to create images of your heart. It provides your doctor with information about the size and shape of your heart and how well your heart's chambers and valves are working. This procedure takes approximately one hour. There are no restrictions for this procedure.   Follow-Up: To be determined after echo results  Any Other Special Instructions Will Be Listed Below (If Applicable).  If you need a refill on your cardiac medications before your next appointment, please call your pharmacy.

## 2022-04-12 ENCOUNTER — Ambulatory Visit (INDEPENDENT_AMBULATORY_CARE_PROVIDER_SITE_OTHER): Payer: No Typology Code available for payment source

## 2022-04-12 DIAGNOSIS — I4891 Unspecified atrial fibrillation: Secondary | ICD-10-CM

## 2022-04-12 LAB — ECHOCARDIOGRAM COMPLETE
AR max vel: 1.73 cm2
AV Area VTI: 1.7 cm2
AV Area mean vel: 1.66 cm2
AV Mean grad: 5.4 mmHg
AV Peak grad: 10 mmHg
Ao pk vel: 1.58 m/s
Area-P 1/2: 3.6 cm2
Calc EF: 59.3 %
MV M vel: 2.58 m/s
MV Peak grad: 26.7 mmHg
S' Lateral: 2.75 cm
Single Plane A2C EF: 65.8 %
Single Plane A4C EF: 54.6 %

## 2022-04-13 ENCOUNTER — Telehealth: Payer: Self-pay | Admitting: Cardiology

## 2022-04-13 NOTE — Telephone Encounter (Signed)
Spouse states that pt is still having dizzy spells. She states that pt wakes up dizzy to where he's falling and bumping into things. Pt's BP was 90/58 this morning. Spouse would like a callback. Please advise

## 2022-04-13 NOTE — Telephone Encounter (Signed)
I spoke with wife and his pcp stopped Flomax today and will touch base with them in 2 weeks. The wife wants to wait and see if stopping Flomax helps his symptoms. She will call me next week (8/1) with update.

## 2022-04-13 NOTE — Telephone Encounter (Signed)
Patient seen 2 days ago and had Lopressor decreased from 75 mg bid to 50 mg bid. Got up during the night to use the bathroom and felt dizzy. BP 90/58, HR 84. Wife said yesterday he felt good with BP 120/70. She also stated he is very lethargic and sleeps all day. This has been going on for the past two months. She states finger sticks report blood sugars in the low 120's. They are speaking with nephrologist regarding his Flomax dose as the know this has side effects of reduced blood pressures and dizziness.     I will forward to [place] for review.

## 2022-04-23 ENCOUNTER — Other Ambulatory Visit (HOSPITAL_COMMUNITY): Payer: Medicare HMO

## 2022-05-24 ENCOUNTER — Telehealth (HOSPITAL_BASED_OUTPATIENT_CLINIC_OR_DEPARTMENT_OTHER): Payer: Self-pay | Admitting: Cardiology

## 2022-05-24 NOTE — Telephone Encounter (Signed)
Left message for patient to call and schedule appt with Dr. Domenic Polite for A Fib and AAA per [facility] referral

## 2022-06-05 ENCOUNTER — Encounter (HOSPITAL_COMMUNITY): Payer: Self-pay

## 2022-06-05 ENCOUNTER — Ambulatory Visit (INDEPENDENT_AMBULATORY_CARE_PROVIDER_SITE_OTHER): Payer: No Typology Code available for payment source | Admitting: Internal Medicine

## 2022-06-05 ENCOUNTER — Other Ambulatory Visit: Payer: Self-pay

## 2022-06-05 ENCOUNTER — Encounter: Payer: Self-pay | Admitting: Internal Medicine

## 2022-06-05 ENCOUNTER — Observation Stay (HOSPITAL_COMMUNITY)
Admission: EM | Admit: 2022-06-05 | Discharge: 2022-06-07 | Disposition: A | Discharge status: Home / Self Care | Payer: No Typology Code available for payment source | Attending: Family Medicine | Admitting: Family Medicine

## 2022-06-05 VITALS — BP 131/82 | HR 65 | Temp 97.7°F | Ht 74.0 in | Wt 251.4 lb

## 2022-06-05 DIAGNOSIS — K2971 Gastritis, unspecified, with bleeding: Secondary | ICD-10-CM | POA: Insufficient documentation

## 2022-06-05 DIAGNOSIS — R55 Syncope and collapse: Secondary | ICD-10-CM

## 2022-06-05 DIAGNOSIS — I129 Hypertensive chronic kidney disease with stage 1 through stage 4 chronic kidney disease, or unspecified chronic kidney disease: Secondary | ICD-10-CM | POA: Insufficient documentation

## 2022-06-05 DIAGNOSIS — K625 Hemorrhage of anus and rectum: Secondary | ICD-10-CM

## 2022-06-05 DIAGNOSIS — K317 Polyp of stomach and duodenum: Secondary | ICD-10-CM | POA: Diagnosis not present

## 2022-06-05 DIAGNOSIS — M1A9XX0 Chronic gout, unspecified, without tophus (tophi): Secondary | ICD-10-CM

## 2022-06-05 DIAGNOSIS — Z794 Long term (current) use of insulin: Secondary | ICD-10-CM | POA: Diagnosis not present

## 2022-06-05 DIAGNOSIS — K921 Melena: Secondary | ICD-10-CM | POA: Diagnosis not present

## 2022-06-05 DIAGNOSIS — M109 Gout, unspecified: Secondary | ICD-10-CM | POA: Diagnosis present

## 2022-06-05 DIAGNOSIS — Z7901 Long term (current) use of anticoagulants: Secondary | ICD-10-CM | POA: Insufficient documentation

## 2022-06-05 DIAGNOSIS — Z6832 Body mass index (BMI) 32.0-32.9, adult: Secondary | ICD-10-CM

## 2022-06-05 DIAGNOSIS — E1122 Type 2 diabetes mellitus with diabetic chronic kidney disease: Secondary | ICD-10-CM

## 2022-06-05 DIAGNOSIS — I482 Chronic atrial fibrillation, unspecified: Secondary | ICD-10-CM | POA: Diagnosis not present

## 2022-06-05 DIAGNOSIS — N1831 Chronic kidney disease, stage 3a: Secondary | ICD-10-CM

## 2022-06-05 DIAGNOSIS — K922 Gastrointestinal hemorrhage, unspecified: Secondary | ICD-10-CM | POA: Diagnosis not present

## 2022-06-05 DIAGNOSIS — E6609 Other obesity due to excess calories: Secondary | ICD-10-CM

## 2022-06-05 DIAGNOSIS — N189 Chronic kidney disease, unspecified: Secondary | ICD-10-CM | POA: Diagnosis present

## 2022-06-05 DIAGNOSIS — I10 Essential (primary) hypertension: Secondary | ICD-10-CM

## 2022-06-05 DIAGNOSIS — E785 Hyperlipidemia, unspecified: Secondary | ICD-10-CM

## 2022-06-05 DIAGNOSIS — Z79899 Other long term (current) drug therapy: Secondary | ICD-10-CM | POA: Diagnosis not present

## 2022-06-05 DIAGNOSIS — R42 Dizziness and giddiness: Secondary | ICD-10-CM | POA: Insufficient documentation

## 2022-06-05 DIAGNOSIS — D132 Benign neoplasm of duodenum: Secondary | ICD-10-CM | POA: Diagnosis not present

## 2022-06-05 DIAGNOSIS — E1159 Type 2 diabetes mellitus with other circulatory complications: Secondary | ICD-10-CM

## 2022-06-05 DIAGNOSIS — N183 Chronic kidney disease, stage 3 unspecified: Secondary | ICD-10-CM | POA: Diagnosis present

## 2022-06-05 DIAGNOSIS — E119 Type 2 diabetes mellitus without complications: Secondary | ICD-10-CM

## 2022-06-05 DIAGNOSIS — Z8673 Personal history of transient ischemic attack (TIA), and cerebral infarction without residual deficits: Secondary | ICD-10-CM | POA: Insufficient documentation

## 2022-06-05 DIAGNOSIS — E669 Obesity, unspecified: Secondary | ICD-10-CM | POA: Diagnosis present

## 2022-06-05 LAB — GLUCOSE, CAPILLARY
Glucose-Capillary: 175 mg/dL — ABNORMAL HIGH (ref 70–99)
Glucose-Capillary: 89 mg/dL (ref 70–99)

## 2022-06-05 LAB — COMPREHENSIVE METABOLIC PANEL
ALT: 26 U/L (ref 0–44)
AST: 24 U/L (ref 15–41)
Albumin: 3.8 g/dL (ref 3.5–5.0)
Alkaline Phosphatase: 122 U/L (ref 38–126)
Anion gap: 10 (ref 5–15)
BUN: 25 mg/dL — ABNORMAL HIGH (ref 8–23)
CO2: 26 mmol/L (ref 22–32)
Calcium: 8.5 mg/dL — ABNORMAL LOW (ref 8.9–10.3)
Chloride: 102 mmol/L (ref 98–111)
Creatinine, Ser: 1.65 mg/dL — ABNORMAL HIGH (ref 0.61–1.24)
GFR, Estimated: 44 mL/min — ABNORMAL LOW (ref >60)
Glucose, Bld: 252 mg/dL — ABNORMAL HIGH (ref 70–99)
Potassium: 3.8 mmol/L (ref 3.5–5.1)
Sodium: 138 mmol/L (ref 135–145)
Total Bilirubin: 0.8 mg/dL (ref 0.3–1.2)
Total Protein: 7.4 g/dL (ref 6.5–8.1)

## 2022-06-05 LAB — CBC
HCT: 46.3 % (ref 39.0–52.0)
HCT: 41.1 % (ref 39.0–52.0)
Hemoglobin: 15.1 g/dL (ref 13.0–17.0)
Hemoglobin: 13.7 g/dL (ref 13.0–17.0)
MCH: 29.9 pg (ref 26.0–34.0)
MCH: 30.4 pg (ref 26.0–34.0)
MCHC: 32.6 g/dL (ref 30.0–36.0)
MCHC: 33.3 g/dL (ref 30.0–36.0)
MCV: 91.7 fL (ref 80.0–100.0)
MCV: 91.1 fL (ref 80.0–100.0)
Platelets: 201 10*3/uL (ref 150–400)
Platelets: 193 10*3/uL (ref 150–400)
RBC: 5.05 MIL/uL (ref 4.22–5.81)
RBC: 4.51 MIL/uL (ref 4.22–5.81)
RDW: 14.2 % (ref 11.5–15.5)
RDW: 14.1 % (ref 11.5–15.5)
WBC: 11.1 10*3/uL — ABNORMAL HIGH (ref 4.0–10.5)
WBC: 11.4 10*3/uL — ABNORMAL HIGH (ref 4.0–10.5)
nRBC: 0 % (ref 0.0–0.2)
nRBC: 0 % (ref 0.0–0.2)

## 2022-06-05 LAB — TSH: TSH: 0.927 u[IU]/mL (ref 0.350–4.500)

## 2022-06-05 LAB — CORTISOL: Cortisol, Plasma: 5.9 ug/dL

## 2022-06-05 LAB — PROTIME-INR
INR: 1.2 (ref 0.8–1.2)
Prothrombin Time: 15.2 seconds (ref 11.4–15.2)

## 2022-06-05 LAB — LACTIC ACID, PLASMA
Lactic Acid, Venous: 2.4 mmol/L (ref 0.5–1.9)
Lactic Acid, Venous: 2.1 mmol/L (ref 0.5–1.9)

## 2022-06-05 LAB — HEMOGLOBIN A1C
Hgb A1c MFr Bld: 6.8 % — ABNORMAL HIGH (ref 4.8–5.6)
Mean Plasma Glucose: 148.46 mg/dL

## 2022-06-05 LAB — TYPE AND SCREEN
ABO/RH(D): O POS
Antibody Screen: NEGATIVE

## 2022-06-05 LAB — VITAMIN B12: Vitamin B-12: 420 pg/mL (ref 180–914)

## 2022-06-05 LAB — MAGNESIUM: Magnesium: 2.1 mg/dL (ref 1.7–2.4)

## 2022-06-05 LAB — PHOSPHORUS: Phosphorus: 2.4 mg/dL — ABNORMAL LOW (ref 2.5–4.6)

## 2022-06-05 MED ORDER — METOPROLOL TARTRATE 50 MG PO TABS
50.0000 mg | ORAL_TABLET | Freq: Two times a day (BID) | ORAL | Status: DC
  Administered 2022-06-05 – 2022-06-07 (×4): 50 mg via ORAL
  Filled 2022-06-05 (×4): qty 1

## 2022-06-05 MED ORDER — HYDRALAZINE HCL 20 MG/ML IJ SOLN
10.0000 mg | Freq: Once | INTRAMUSCULAR | Status: AC
  Administered 2022-06-06: 10 mg via INTRAVENOUS
  Filled 2022-06-05: qty 1

## 2022-06-05 MED ORDER — TIMOLOL MALEATE 0.5 % OP SOLN
1.0000 [drp] | Freq: Two times a day (BID) | OPHTHALMIC | Status: DC
  Administered 2022-06-05 – 2022-06-07 (×4): 1 [drp] via OPHTHALMIC
  Filled 2022-06-05: qty 5

## 2022-06-05 MED ORDER — INSULIN ASPART 100 UNIT/ML IJ SOLN
0.0000 [IU] | Freq: Three times a day (TID) | INTRAMUSCULAR | Status: DC
  Administered 2022-06-05: 3 [IU] via SUBCUTANEOUS
  Administered 2022-06-06 – 2022-06-07 (×3): 2 [IU] via SUBCUTANEOUS

## 2022-06-05 MED ORDER — PANTOPRAZOLE SODIUM 40 MG IV SOLR
40.0000 mg | Freq: Two times a day (BID) | INTRAVENOUS | Status: DC
  Administered 2022-06-05 – 2022-06-07 (×5): 40 mg via INTRAVENOUS
  Filled 2022-06-05 (×5): qty 10

## 2022-06-05 MED ORDER — ACETAMINOPHEN 325 MG PO TABS: 650.0000 mg | ORAL_TABLET | Freq: Four times a day (QID) | ORAL | Status: DC | PRN

## 2022-06-05 MED ORDER — ALLOPURINOL 100 MG PO TABS
100.0000 mg | ORAL_TABLET | Freq: Every day | ORAL | Status: DC
  Administered 2022-06-06 – 2022-06-07 (×2): 100 mg via ORAL
  Filled 2022-06-05 (×2): qty 1

## 2022-06-05 MED ORDER — LACTATED RINGERS IV BOLUS
1000.0000 mL | Freq: Once | INTRAVENOUS | Status: AC
  Administered 2022-06-05: 1000 mL via INTRAVENOUS

## 2022-06-05 MED ORDER — ACETAMINOPHEN 650 MG RE SUPP: 650.0000 mg | Freq: Four times a day (QID) | RECTAL | Status: DC | PRN

## 2022-06-05 MED ORDER — INSULIN GLARGINE-YFGN 100 UNIT/ML ~~LOC~~ SOLN
15.0000 [IU] | Freq: Every day | SUBCUTANEOUS | Status: DC
  Administered 2022-06-05: 15 [IU] via SUBCUTANEOUS
  Filled 2022-06-05 (×2): qty 0.15

## 2022-06-05 MED ORDER — ONDANSETRON HCL 4 MG/2ML IJ SOLN: 4.0000 mg | Freq: Four times a day (QID) | INTRAMUSCULAR | Status: DC | PRN

## 2022-06-05 MED ORDER — EZETIMIBE 10 MG PO TABS
10.0000 mg | ORAL_TABLET | Freq: Every day | ORAL | Status: DC
  Administered 2022-06-06 – 2022-06-07 (×2): 10 mg via ORAL
  Filled 2022-06-05 (×2): qty 1

## 2022-06-05 MED ORDER — DULOXETINE HCL 30 MG PO CPEP
30.0000 mg | ORAL_CAPSULE | Freq: Every day | ORAL | Status: DC
  Administered 2022-06-05 – 2022-06-07 (×3): 30 mg via ORAL
  Filled 2022-06-05 (×3): qty 1

## 2022-06-05 MED ORDER — ONDANSETRON HCL 4 MG PO TABS: 4.0000 mg | ORAL_TABLET | Freq: Four times a day (QID) | ORAL | Status: DC | PRN

## 2022-06-05 MED ORDER — SODIUM CHLORIDE 0.9 % IV SOLN: INTRAVENOUS | Status: AC

## 2022-06-05 NOTE — H&P (Signed)
History and Physical    Patient: Benjamin [name] RCV:[id number] DOB: [birth year] DOA: 06/05/2022 DOS: the patient was seen and examined on 06/05/2022 PCP: [facility]  Patient coming from: Home (presented from GI office to ED)  Chief Complaint:  Chief Complaint  Patient presents with   Rectal Bleeding   HPI: Benjamin [name] is a 71 y.o. male with medical history significant of gout, essential hypertension, hyperlipidemia, type 2 diabetes with nephropathy, chronic kidney disease stage III yea, class I obesity and atrial fibrillation (on chronic Eliquis); who presented to the hospital secondary to melanotic stools and complains of near syncope/syncope events that have been ongoing for approximately 3-4 weeks.  Patient reports feeling occasionally dizzy/lightheaded and has not really in the passing out.  At bedside family reports that his antihypertensive agents has been adjusted in time to minimize the chances of passing out, without signs of success. Patient has reported the presence of black stools and presented to the GI office for further evaluation and management.  He was sent to the emergency department to rule out hemodynamical unstability and further evaluation and management.  Patient reports no chest pain, no shortness of breath, no fever, no nausea, vomiting or abdominal pain.  There is no dysuria or hematuria.  Patient expressed no use of NSAIDs and is chronically on PPI orally twice a day.  Work-up demonstrated Melanotic stool on rectal exam; stable hemoglobin trend in the 15 range.  There is mild elevation of BUN at 25 with a stable creatinine of 1.6.  Patient with chronic renal failure.  Lactic acid 2.4.  IV fluids, type and screen and [facility] service consulted for patient to be admitted in order to pursuit further evaluation and management.   Review of Systems: As mentioned in the history of present illness. All other systems reviewed and are negative. Past Medical  History:  Diagnosis Date   Arthritis    Essential hypertension    Gout    History of stroke    Right occipital May 2018   Hypertensive heart disease    LVH with grade 2 diastolic dysfunction   Mixed hyperlipidemia    Statin intolerance   Rotator cuff tear    Chronic pain   Sleep apnea    a. intolerant to CPAP   Stroke ([place]) 10/1998   TIA   Tubular adenoma    Type 2 diabetes mellitus ([place])    Past Surgical History:  Procedure Laterality Date   APPENDECTOMY     COLONOSCOPY N/A 10/29/2013   Dr. [name]- sigmoid polyp status post cold snare removal. large sessile ascending colon polyp. debulked with saline assisted snare polypectomy . pancolonic diverticulosis. inadequate prep. tubular adenoma on bx   COLONOSCOPY N/A 02/22/2014   Dr. [name]: Saline-assisted snare polypectomy/biopsy for sprawling carpet polyp in the ascending colon which could not be completely removed. Status post biopsy (tubular adenoma), tattooed   COLONOSCOPY N/A 03/01/2014   ZWC:HENI polypectomy hemorrhage s/p bleeding   COLONOSCOPY N/A 06/08/2015   Procedure: COLONOSCOPY;  Surgeon: [name], MD;  [facility];  Service: Endoscopy;  Laterality: N/A;  0900   COLONOSCOPY WITH PROPOFOL N/A 11/14/2020   Procedure: COLONOSCOPY WITH PROPOFOL;  Surgeon: [name], MD;  [facility];  Service: Endoscopy;  Laterality: N/A;  8:15am   ESOPHAGOGASTRODUODENOSCOPY N/A 02/22/2014   Dr. [name]: Erosive reflux esophagitis. Schatzki ring status post dilation. Gastric and duodenal erosions with benign biopsies.   LAPAROSCOPIC RIGHT COLECTOMY N/A 04/16/2014  Procedure: LAPAROSCOPIC ASSISTED RIGHT COLECTOMY;  Surgeon: [name], MD;  Location: [place];  Service: General;  Laterality: N/A;   MALONEY DILATION N/A 02/22/2014   Procedure: [name];  Surgeon: [name], MD;  [facility];  Service: Endoscopy;  Laterality: N/A;   SAVORY DILATION N/A 02/22/2014   Procedure: SAVORY DILATION;   Surgeon: [name], MD;  [facility];  Service: Endoscopy;  Laterality: N/A;   Social History:  reports that he quit smoking about 46 years ago. His smoking use included cigarettes. He started smoking about 60 years ago. He has never used smokeless tobacco. He reports that he does not drink alcohol and does not use drugs.  Allergies  Allergen Reactions   Niacin-Lovastatin Er Other (See Comments)    ADVICOR= Body aches   Statins     Body aches   Tamsulosin     Other reaction(s): Low blood pressure   Zocor [Simvastatin - High Dose] Other (See Comments)    Body aches.    Family History  Problem Relation Age of Onset   Diabetes type II Mother    Colon cancer Neg Hx     Prior to Admission medications   Medication Sig Start Date End Date Taking? Authorizing Provider  acetaminophen (TYLENOL) 650 MG CR tablet Take 650 mg by mouth as needed for pain.     [provider]  allopurinol (ZYLOPRIM) 100 MG tablet Take 100 mg by mouth daily.    [provider]  apixaban (ELIQUIS) 5 MG TABS tablet Take 5 mg by mouth 2 (two) times daily.    [provider]  cetirizine (ZYRTEC) 10 MG tablet Take 10 mg by mouth daily.    [provider]  cholecalciferol (VITAMIN D3) 25 MCG (1000 UNIT) tablet Take 1,000 Units by mouth daily.    [provider]  cyclobenzaprine (FLEXERIL) 10 MG tablet TAKE ONE-HALF TABLET BY MOUTH AT BEDTIME FOR 2 WEEKS, THEN TAKE ONE TABLET AT BEDTIME FOR 4 WEEKS, THEN TAKE ONE AND ONE-HALF TABLETS AT BEDTIME 04/20/22   [provider]  DULoxetine (CYMBALTA) 30 MG capsule Take 30 mg by mouth daily.    [provider]  empagliflozin (JARDIANCE) 25 MG TABS tablet Take 12.5 mg by mouth daily. Half tablet daily    [provider]  ezetimibe (ZETIA) 10 MG tablet TAKE ONE TABLET BY MOUTH DAILY FOR CHOLESTEROL 10/24/21   [provider]  insulin glargine (LANTUS) 100 UNIT/ML injection Inject 54 Units  into the skin daily. IN THE MORNING    [provider]  losartan (COZAAR) 100 MG tablet Take 1 tablet by mouth daily. 05/16/22   [provider]  metoprolol tartrate (LOPRESSOR) 50 MG tablet Take 1 tablet (50 mg total) by mouth 2 (two) times daily. 04/11/22   Satira Sark, MD  pantoprazole (PROTONIX) 40 MG tablet Take 1 tablet (40 mg total) by mouth daily. Patient taking differently: Take 40 mg by mouth in the morning and at bedtime. 09/13/15   Annitta Needs, NP  Semaglutide, 1 MG/DOSE, 2 MG/1.5ML SOPN Inject 1 mg into the skin every Wednesday.     [provider]  timolol (TIMOPTIC) 0.5 % ophthalmic solution Place 1 drop into the left eye 2 (two) times daily. 10/27/20   [provider]    Physical Exam: Vitals:   06/05/22 1345 06/05/22 1400 06/05/22 1415 06/05/22 1457  BP:  130/84  (!) 165/88  Pulse: (!) 56 63  (!)  48  Resp: '18  16 16  '$ Temp:    98.2 F (36.8 C)  TempSrc:    Oral  SpO2: 94% 95% 96% 100%  Weight:      Height:       General exam: Alert, awake, oriented x 3; no fever, no chest pain, no shortness of breath. Respiratory system: Clear to auscultation. Respiratory effort normal.  Good saturation on room air. Cardiovascular system: Rate controlled, no rubs, no gallops, no JVD. Gastrointestinal system: Abdomen is nondistended, soft and nontender. No organomegaly or masses felt. Normal bowel sounds heard. Central nervous system: Alert and oriented. No focal neurological deficits. Extremities: No cyanosis or clubbing. Skin: No petechiae. Psychiatry: Judgement and insight appear normal. Mood & affect appropriate.   Data Reviewed: Lactic acid 2.4 CBC: WBCs 11.1, hemoglobin 15.1, platelet count 201K Comprehensive metabolic panel: Sodium 355, potassium 3.8, chloride 102, bicarb 26, BUN 25, creatinine 1.65 and normal LFTs.  Patient's GFR 44.  Assessment and Plan: * Gastrointestinal hemorrhage with melena - Patient reporting melanotic  stools for couple weeks now -At time of admission reporting presyncope/prior syncope events. -No use of NSAIDs or aspirin, but chronically on Eliquis due to atrial fibrillation. -Patient will require endoscopic evaluation to rule out major source of bleeding prior to continuation of anticoagulation. -Hold Eliquis -Clear liquid diet -IV Protonix -Type and screen -Overall hemodynamically stable currently. -Hemoglobin in the 15 range currently.  Obesity -Body mass index is 32.1 kg/m. -low calorie diet and portion control discussed with patient.  Syncope -presumed to be associated with GIB -but patient expressing symptoms present for over a month or so. -Hgb stable currently, but with positive melanotic stools -will check orthostatic VS, check cortisol, TSH and B12 -holding losartan and will follow VS -recent echo stable; will monitor on telemetry.  Atrial fibrillation, chronic (HCC) -Rate controlled -Patient denies palpitation. -Holding Eliquis in the setting of melena/GI bleed. -Continue beta-blocker. -Telemetry monitoring. -Follow electrolytes and replete as needed (goal is for potassium above 4 and magnesium above 2).  CKD (chronic kidney disease) - Secondary to diabetes -Patient with chronic kidney disease stage IIIa at baseline -Appears to be stable -Maintain adequate hydration -Minimize the use of nephrotoxic agents, contrast and hypotension -Follow renal function trend and stability.  Gout -no acute flare currently -continue allopurinol   DM (diabetes mellitus) ([place]) - With nephropathy and chronic use of insulin -Update A1c -Sliding scale insulin and Semglee will be provided while inpatient -Follow CBGs and adjust hypoglycemic regimen as needed.  Hyperlipidemia -continue zetia.  Essential hypertension, benign - Stable currently -Due to patient's symptoms of near syncope and previous syncope event we will hold losartan and continue adjusting his  beta-blocker -Maintain adequate hydration -Checking orthostatic vital signs and cortisol level.  Lactic acidosis -Lactic acid 2.4 on admission -Fluid resuscitation initiated -Will follow lactic acid trend -No signs of infection currently. -Probably secondary to mild dehydration.    Advance Care Planning:   Code Status: Full Code   Consults: GI service  Family Communication: No family at bedside.  Severity of Illness: The appropriate patient status for this patient is OBSERVATION. Observation status is judged to be reasonable and necessary in order to provide the required intensity of service to ensure the patient's safety. The patient's presenting symptoms, physical exam findings, and initial radiographic and laboratory data in the context of their medical condition is felt to place them at decreased risk for further clinical deterioration. Furthermore, it is anticipated that the patient will be medically stable for  discharge from the hospital within 2 midnights of admission.   Author: Barton Dubois, MD 06/05/2022 5:19 PM  For on call review [email or web address].

## 2022-06-05 NOTE — Assessment & Plan Note (Signed)
-  Body mass index is 32.1 kg/m. -low calorie diet and portion control discussed with patient.

## 2022-06-05 NOTE — Assessment & Plan Note (Addendum)
-   Patient reporting melanotic stools for couple weeks now -At time of admission reporting presyncope/prior syncope events. -No use of NSAIDs or aspirin, but chronically on Eliquis due to atrial fibrillation. -Patient will require endoscopic evaluation to rule out major source of bleeding prior to continuation of anticoagulation. -Hold Eliquis -Clear liquid diet -IV Protonix -Type and screen -Overall hemodynamically stable currently. -Hemoglobin in the 15 range currently.

## 2022-06-05 NOTE — Assessment & Plan Note (Signed)
-  continue zetia.

## 2022-06-05 NOTE — Assessment & Plan Note (Signed)
-   Stable currently -Due to patient's symptoms of near syncope and previous syncope event we will hold losartan and continue adjusting his beta-blocker -Maintain adequate hydration -Checking orthostatic vital signs and cortisol level.

## 2022-06-05 NOTE — Assessment & Plan Note (Signed)
-   Secondary to diabetes -Patient with chronic kidney disease stage IIIa at baseline -Appears to be stable -Maintain adequate hydration -Minimize the use of nephrotoxic agents, contrast and hypotension -Follow renal function trend and stability.

## 2022-06-05 NOTE — Progress Notes (Signed)
Alert and oriented.  Stated that bm yesterday was dark but today was closer to normal brown color.  To have EGD Thursday due to taking Eliquis.  Contacted Dr. [name] and Aliene Altes by secure chat to see if they were going to change diet from clears to soft

## 2022-06-05 NOTE — ED Notes (Signed)
CRITICAL VALUE STICKER  CRITICAL VALUE: 2.4  RECEIVER (on-site recipient of call): Moody Bruins, RN  DATE & TIME NOTIFIED: 06/05/22 & 1342  MD NOTIFIED: Mayra Neer  TIME OF NOTIFICATION: 1342  RESPONSE:  na

## 2022-06-05 NOTE — Assessment & Plan Note (Signed)
-   With nephropathy and chronic use of insulin -Update A1c -Sliding scale insulin and Semglee will be provided while inpatient -Follow CBGs and adjust hypoglycemic regimen as needed.

## 2022-06-05 NOTE — ED Provider Notes (Signed)
[facility] EMERGENCY DEPARTMENT Provider Note   CSN: [id number] Arrival date & time: 06/05/22  1103     History  Chief Complaint  Patient presents with   Rectal Bleeding    TAVAUGHN SILGUERO is a 71 y.o. male with Afib on eliquis, h/o CVA, HTN, T2DM, gout, LVH, diverticulosis, h/o adenomatous colon polyp, who presents with rectal bleeding, lightheadedness.  Patient presents from GI clinic with rectal bleeding, frank melena, and lightheadedness. Melena started 2 days ago, Sunday morning. No abdominal pain, fevers chills, CP, SOB, palpitations, leg swelling, dysuria/hematuria. Does not feel lightheaded as he lays here now, but if he gets up he does. Has felt lightheaded for months, and has fallen and syncopized multiple times, most recently 2 weeks ago. States he has not hit his head. Had orthostatics done at GI clinic today which were positive. No h/o colorectal cancer. No h/o similar GIB. Takes eliquis for Afib.  Per GI note from today: Reflux well controlled on twice daily PPI.  Ongoing anticoagulation with Eliquis.  Denies any form of aspirin/NSAID therapy.  Known pancolonic diverticulosis and multiple colonic adenomas removed over time.  In fact, a large carpet polyp in the right colon was removed with surgical resection several years ago.  Last colonoscopy 2022-pancolonic diverticulosis. History of Schatzki's ring and reflux esophagitis status post dilation several years ago.  Non-H. pylori gastritis found.   Rectal Bleeding      Home Medications Prior to Admission medications   Medication Sig Start Date End Date Taking? Authorizing Provider  acetaminophen (TYLENOL) 650 MG CR tablet Take 650 mg by mouth as needed for pain.     [provider]  allopurinol (ZYLOPRIM) 100 MG tablet Take 100 mg by mouth daily.    [provider]  apixaban (ELIQUIS) 5 MG TABS tablet Take 5 mg by mouth 2 (two) times daily.    [provider]  cetirizine (ZYRTEC) 10 MG  tablet Take 10 mg by mouth daily.    [provider]  cholecalciferol (VITAMIN D3) 25 MCG (1000 UNIT) tablet Take 1,000 Units by mouth daily.    [provider]  cyclobenzaprine (FLEXERIL) 10 MG tablet TAKE ONE-HALF TABLET BY MOUTH AT BEDTIME FOR 2 WEEKS, THEN TAKE ONE TABLET AT BEDTIME FOR 4 WEEKS, THEN TAKE ONE AND ONE-HALF TABLETS AT BEDTIME 04/20/22   [provider]  DULoxetine (CYMBALTA) 30 MG capsule Take 30 mg by mouth daily.    [provider]  empagliflozin (JARDIANCE) 25 MG TABS tablet Take 12.5 mg by mouth daily. Half tablet daily    [provider]  ezetimibe (ZETIA) 10 MG tablet TAKE ONE TABLET BY MOUTH DAILY FOR CHOLESTEROL 10/24/21   [provider]  insulin glargine (LANTUS) 100 UNIT/ML injection Inject 54 Units into the skin daily. IN THE MORNING    [provider]  losartan (COZAAR) 100 MG tablet Take 1 tablet by mouth daily. 05/16/22   [provider]  metoprolol tartrate (LOPRESSOR) 50 MG tablet Take 1 tablet (50 mg total) by mouth 2 (two) times daily. 04/11/22   Satira Sark, MD  pantoprazole (PROTONIX) 40 MG tablet Take 1 tablet (40 mg total) by mouth daily. Patient taking differently: Take 40 mg by mouth in the morning and at bedtime. 09/13/15   Annitta Needs, NP  Semaglutide, 1 MG/DOSE, 2 MG/1.5ML SOPN Inject 1 mg into the skin every Wednesday.     [provider]  timolol (TIMOPTIC) 0.5 % ophthalmic solution Place 1 drop into  the left eye 2 (two) times daily. 10/27/20   [provider]      Allergies    Niacin-lovastatin er, Statins, Tamsulosin, and Zocor [simvastatin - high dose]    Review of Systems   Review of Systems  Gastrointestinal:  Positive for hematochezia.   Review of systems negative for f/c.  A 10 point review of systems was performed and is negative unless otherwise reported in HPI.  Physical Exam Updated Vital Signs BP (!) 155/106 (BP Location: Left Arm)   Pulse  63   Temp 97.8 F (36.6 C) (Oral)   Resp 18   Ht '6\' 2"'$  (1.88 m)   Wt 113.4 kg   SpO2 95%   BMI 32.10 kg/m  Physical Exam General: Slightly pale-appearing male, lying in bed.  HEENT: PERRLA, Sclera anicteric, MMM, trachea midline. Cardiology: RRR, no murmurs/rubs/gallops. BL radial and DP pulses equal bilaterally.  Resp: Normal respiratory rate and effort. CTAB, no wheezes, rhonchi, crackles.  Abd: Soft, non-tender, non-distended. No rebound tenderness or guarding.  Rectal: Deferred, as patient just had it done at GI clinic MSK: No peripheral edema or signs of trauma. Extremities without deformity or TTP. No cyanosis or clubbing. Skin: warm, dry. No rashes or lesions. Neuro: A&Ox4, CNs II-XII grossly intact. MAEs. Sensation grossly intact.  Psych: Normal mood and affect.   ED Results / Procedures / Treatments   Labs (all labs ordered are listed, but only abnormal results are displayed) Labs Reviewed  COMPREHENSIVE METABOLIC PANEL - Abnormal; Notable for the following components:      Result Value   Glucose, Bld 252 (*)    BUN 25 (*)    Creatinine, Ser 1.65 (*)    Calcium 8.5 (*)    GFR, Estimated 44 (*)    All other components within normal limits  CBC - Abnormal; Notable for the following components:   WBC 11.1 (*)    All other components within normal limits  LACTIC ACID, PLASMA - Abnormal; Notable for the following components:   Lactic Acid, Venous 2.4 (*)    All other components within normal limits  LACTIC ACID, PLASMA - Abnormal; Notable for the following components:   Lactic Acid, Venous 2.1 (*)    All other components within normal limits   Procedures Procedures    Medications Ordered in ED Medications  lactated ringers bolus 1,000 mL (0 mLs Intravenous Stopped 06/05/22 1515)    ED Course/ Medical Decision Making/ A&P                          Medical Decision Making Amount and/or Complexity of Data Reviewed Labs: ordered. Decision-making details  documented in ED Course.  Risk Decision regarding hospitalization.   Patient is overall hemodynamically stable, no hypotension or tachycardia to suggest acute hemorrhagic shock.  Based on clinical presentation, hematochezia reported and clinical exam history and findings, most concerned about LGIB.   DDx for LGIB includes but is not limited to: Patient with well-known history of extensive colonic diverticulosis.  Also consider hemorrhoids, anal fissure, rectal foreign body, diverticulitis/diverticulosis, ischemic colitis, IBD, AVMs or Dieulafoy lesions, infectious colitis, malignancy.  Patient had rectal completed at GI clinic and no hemorrhoids or anal fissures or rectal foreign bodies were noted at that time.  Patient has no abdominal pain to suggest ischemic colitis or acute diverticulitis, and abdominal exam is benign with no peritoneal findings. Patient reports that he has been falling at home and even has fainted in  the past, last episode was 2 weeks ago.  Patient only now has begun to have melena but consider bleeding as a possible etiology of the symptoms as well as his current ongoing lightheadedness with standing.  Also consider other etiologies such as electrolyte abnormalities, renal injury, dehydration.  No hypo or hyperglycemia.  No ongoing palpitations, chest pain, shortness of breath to suggest ACS.  Patient does have a history of A-fib, and patient's telemetry demonstrates rate controlled A-fib at this time.   I have personally reviewed and interpreted all labs and imaging.   Clinical Course as of 06/09/22 1702  Tue Jun 05, 2022  1350 Lactic Acid, Venous(!!): 2.4 [HN]  1351 Hemoglobin: 15.1 [HN]  1414 Patient consulted to and admitted to the hospitalist who requests that I discuss with GI as well. [HN]    Clinical Course User Index [HN] Audley Hose, MD    Labs actually demonstrate hemoglobin 15.1, no need for transfusion.  BUN 25, creatinine 1.65 which is consistent with  baseline values.  Lactate 2.4 and patient is given volume resuscitation.  The patient has no acute anemia, but given that he has ongoing falls at home and is noted to be bleeding with melena GI clinic, believe patient would benefit from admission and evaluation with endoscopy/colonoscopy with GI.  Patient is consulted to hospitalist and I discussed with GI, who is aware of this patient.  Dispo: Patient was admitted to the hospital in stable condition.          Final Clinical Impression(s) / ED Diagnoses Final diagnoses:  Rectal bleeding  Episodic lightheadedness    Rx / DC Orders ED Discharge Orders     None        This note was created using dictation software, which may contain spelling or grammatical errors.    Audley Hose, MD 06/09/22 [phone]

## 2022-06-05 NOTE — Patient Instructions (Signed)
It was good to see you again today!  You appear to be having a gastrointestinal hemorrhage.  As discussed, you should go directly to the [facility] emergency department for further evaluation.  They have been notified.  Do not take any further Eliquis or Ozempic until further instructions have been given.

## 2022-06-05 NOTE — Progress Notes (Signed)
Brief progress note:   71 year old male with atrial fibrillation chronically anticoagulated on Eliquis, seen in our office today by Dr. [name] for 3 days of melena along with presyncopal episodes over the past weeks. He denied abdominal pain, NSAIDs, and is chronically on PPI BID which has kept his chronic reflux well controlled.  History significant for reflux esophagitis and non-h pylori gastritis several years ago as well colonic diverticulosis and multiple colonic adenomas removed over time including a large carpet polyp in the right colon requiring surgical resection in 2015.  Last 2 colonoscopies (2016 and 2022) with colonic diverticulosis.  As he was found to be orthostatic in the office and had black stool that was heme positive on exam, he was referred to the emergency room.  In the ED, he is hemodynamically stable, his hemoglobin is stable at 15.1, BUN elevated at 25 though creatinine also elevated at 1.65. He tells me that his last dose of Eliquis was this morning and that he actually had a BM after getting to the emergency room that was almost back to normal.   Concerned about proximal GI bleed. Sounds like this may be tapering off, but needs evaluation while admitted so anticoagulation can be safely resumed. He will need EGD for further evaluation. Could consider capsule placement if EGD is unrevealing vs updating colonoscopy to rule out right sided lesion.  He will need to hold Eliquis for now.   As his last dose of Eliquis was morning of 9/19, procedures can be performed on Thursday 9/21.   Recommend IV PPI BID and ongoing monitoring of hemoglobin and for overt GI bleeding.   Aliene Altes, PA-C [facility]

## 2022-06-05 NOTE — Assessment & Plan Note (Addendum)
-  Rate controlled -Patient denies palpitation. -Holding Eliquis in the setting of melena/GI bleed. -Continue beta-blocker. -Telemetry monitoring. -Follow electrolytes and replete as needed (goal is for potassium above 4 and magnesium above 2).

## 2022-06-05 NOTE — Progress Notes (Signed)
Manual blood pressure of 180/106. Informed MD and will give 2200 dose of Lopressor at 2100. Will continue to monitor

## 2022-06-05 NOTE — ED Triage Notes (Signed)
Pt presents to ED with complaints of black tarry stools started Sunday. Sent by GI

## 2022-06-05 NOTE — Assessment & Plan Note (Signed)
-  presumed to be associated with GIB -but patient expressing symptoms present for over a month or so. -Hgb stable currently, but with positive melanotic stools -will check orthostatic VS, check cortisol, TSH and B12 -holding losartan and will follow VS -recent echo stable; will monitor on telemetry.

## 2022-06-05 NOTE — Progress Notes (Signed)
Primary Care Physician:  Cl[facility], [facility] Primary Gastroenterologist:  Dr.   Pre-Procedure History & Physical: HPI:  Benjamin [name] is a 71 y.o. male veteran followed by the [facility] with atrial fibrillation on Eliquis comes to the office today with a 3-day history of reported melena.  Multiple episodes of black tarry bowel movements.  Recent presyncopal episodes over the past few weeks.  Has not had any associated abdominal pain or hematochezia.  Denies hematemesis.  Reflux well controlled on twice daily PPI.  Ongoing anticoagulation with Eliquis.  Denies any form of aspirin/NSAID therapy.  Known pancolonic diverticulosis and multiple colonic adenomas removed over time.  In fact, a large carpet polyp in the right colon was removed with surgical resection several years ago.  Last colonoscopy 2022-pancolonic diverticulosis. History of Schatzki's ring and reflux esophagitis status post dilation several years ago.  Non-H. pylori gastritis found. Because of presyncopal episodes, [facility] has de-escalated his antihypertensive regimen.  Past Medical History:  Diagnosis Date   Arthritis    Essential hypertension    Gout    History of stroke    Right occipital May 2018   Hypertensive heart disease    LVH with grade 2 diastolic dysfunction   Mixed hyperlipidemia    Statin intolerance   Rotator cuff tear    Chronic pain   Sleep apnea    a. intolerant to CPAP   Stroke ([place]) 10/1998   TIA   Tubular adenoma    Type 2 diabetes mellitus ([place])     Past Surgical History:  Procedure Laterality Date   APPENDECTOMY     COLONOSCOPY N/A 10/29/2013   Dr. [name]- sigmoid polyp status post cold snare removal. large sessile ascending colon polyp. debulked with saline assisted snare polypectomy . pancolonic diverticulosis. inadequate prep. tubular adenoma on bx   COLONOSCOPY N/A 02/22/2014   Dr. [name]: Saline-assisted snare polypectomy/biopsy for sprawling carpet polyp in the ascending colon  which could not be completely removed. Status post biopsy (tubular adenoma), tattooed   COLONOSCOPY N/A 03/01/2014   WVP:XTGG polypectomy hemorrhage s/p bleeding   COLONOSCOPY N/A 06/08/2015   Procedure: COLONOSCOPY;  Surgeon: [name], MD;  [facility];  Service: Endoscopy;  Laterality: N/A;  0900   COLONOSCOPY WITH PROPOFOL N/A 11/14/2020   Procedure: COLONOSCOPY WITH PROPOFOL;  Surgeon: [name], MD;  Location: [facility];  Service: Endoscopy;  Laterality: N/A;  8:15am   ESOPHAGOGASTRODUODENOSCOPY N/A 02/22/2014   Dr. [name]: Erosive reflux esophagitis. Schatzki ring status post dilation. Gastric and duodenal erosions with benign biopsies.   LAPAROSCOPIC RIGHT COLECTOMY N/A 04/16/2014   Procedure: LAPAROSCOPIC ASSISTED RIGHT COLECTOMY;  Surgeon: [name], MD;  Location: [place];  Service: General;  Laterality: N/A;   MALONEY DILATION N/A 02/22/2014   Procedure: [name];  Surgeon: [name], MD;  [facility];  Service: Endoscopy;  Laterality: N/A;   SAVORY DILATION N/A 02/22/2014   Procedure: SAVORY DILATION;  Surgeon: [name], MD;  [facility];  Service: Endoscopy;  Laterality: N/A;    Prior to Admission medications   Medication Sig Start Date End Date Taking? Authorizing Provider  acetaminophen (TYLENOL) 650 MG CR tablet Take 650 mg by mouth as needed for pain.    Yes [provider]  allopurinol (ZYLOPRIM) 100 MG tablet Take 100 mg by mouth daily.   Yes [provider]  apixaban (ELIQUIS) 5 MG TABS tablet Take 5 mg by mouth 2 (  two) times daily.   Yes [provider]  cetirizine (ZYRTEC) 10 MG tablet Take 10 mg by mouth daily.   Yes [provider]  cholecalciferol (VITAMIN D3) 25 MCG (1000 UNIT) tablet Take 1,000 Units by mouth daily.   Yes [provider]  cyclobenzaprine (FLEXERIL) 10 MG tablet TAKE ONE-HALF TABLET BY MOUTH AT BEDTIME FOR 2 WEEKS, THEN TAKE ONE TABLET AT  BEDTIME FOR 4 WEEKS, THEN TAKE ONE AND ONE-HALF TABLETS AT BEDTIME 04/20/22  Yes [provider]  DULoxetine (CYMBALTA) 30 MG capsule Take 30 mg by mouth daily.   Yes [provider]  empagliflozin (JARDIANCE) 25 MG TABS tablet Take 12.5 mg by mouth daily. Half tablet daily   Yes [provider]  ezetimibe (ZETIA) 10 MG tablet TAKE ONE TABLET BY MOUTH DAILY FOR CHOLESTEROL 10/24/21  Yes [provider]  insulin glargine (LANTUS) 100 UNIT/ML injection Inject 54 Units into the skin daily. IN THE MORNING   Yes [provider]  losartan (COZAAR) 100 MG tablet Take 1 tablet by mouth daily. 05/16/22  Yes [provider]  metoprolol tartrate (LOPRESSOR) 50 MG tablet Take 1 tablet (50 mg total) by mouth 2 (two) times daily. 04/11/22  Yes Satira Sark, MD  pantoprazole (PROTONIX) 40 MG tablet Take 1 tablet (40 mg total) by mouth daily. Patient taking differently: Take 40 mg by mouth in the morning and at bedtime. 09/13/15  Yes Annitta Needs, NP  Semaglutide, 1 MG/DOSE, 2 MG/1.5ML SOPN Inject 1 mg into the skin every Wednesday.    Yes [provider]  timolol (TIMOPTIC) 0.5 % ophthalmic solution Place 1 drop into the left eye 2 (two) times daily. 10/27/20  Yes [provider]    Allergies as of 06/05/2022 - Review Complete 06/05/2022  Allergen Reaction Noted   Niacin-lovastatin er Other (See Comments) 10/18/2011   Statins  04/09/2014   Tamsulosin  04/24/2022   Zocor [simvastatin - high dose] Other (See Comments) 10/18/2011    Family History  Problem Relation Age of Onset   Diabetes type II Mother    Colon cancer Neg Hx     Social History   Socioeconomic History   Marital status: Married    Spouse name: Not on file   Number of children: 2   Years of education: Not on file   Highest education level: Not on file  Occupational History   Occupation: Programmer, systems: [employer]   Tobacco Use   Smoking status: Former     Types: Cigarettes    Start date: 09/17/1961    Quit date: 09/18/1975    Years since quitting: 46.7   Smokeless tobacco: Never   Tobacco comments:    quit 35 years ago  Vaping Use   Vaping Use: Never used  Substance and Sexual Activity   Alcohol use: No    Alcohol/week: 0.0 standard drinks of alcohol    Comment: None in over a year. prior to that 1-2 beers couple times per week.   Drug use: No   Sexual activity: Yes  Other Topics Concern   Not on file  Social History Narrative   Not on file   Social Determinants of Health   Financial Resource Strain: Not on file  Food Insecurity: Not on file  Transportation Needs: Not on file  Physical Activity: Not on file  Stress: Not on file  Social Connections: Not on file  Intimate Partner Violence: Not on file  Review of Systems: See HPI, otherwise negative ROS  Physical Exam: BP 131/82 (BP Location: Left Arm, Patient Position: Sitting, Cuff Size: Normal)   Pulse 65   Temp 97.7 F (36.5 C) (Temporal)   Ht '6\' 2"'$  (1.88 m)   Wt 251 lb 6.4 oz (114 kg)   SpO2 99%   BMI 32.28 kg/m  General:   Alert,  pleasant and cooperative in NAD: Ambulated into the exam room Orthostatics: Blood pressure lying 170/79 : Standing 112/78; pulse lying 77; standing 67 Eyes:  Sclera clear, no icterus.   Conjunctiva pale.   Neck:  Supple; no masses or thyromegaly. No significant cervical adenopathy. Lungs:  Clear throughout to auscultation.   No wheezes, crackles, or rhonchi. No acute distress. Heart:  Regular rate and rhythm; no murmurs, clicks, rubs,  or gallops. Abdomen: Non-distended, normal bowel sounds.  Soft and nontender without appreciable mass or hepatosplenomegaly.  Pulses:  Normal pulses noted. Extremities:  Without clubbing or edema. Rectal: Good sphincter tone.  No mass in rectal vault.  Black stool strongly Hemoccult positive.   Impression/Plan: 71 year old gentleman with a history of atrial fibrillation on chronic anticoagulation  with Eliquis presents to my office with a 3-day history of melena along with presyncopal episodes over the past weeks. He is orthostatic by blood pressure today.  Significant drop in systolic blood pressure upon standing. I am concerned he has suffered a recent/possibly ongoing proximal GI bleed. He needs further urgent evaluation.  Recommendations:  Discussed with patient and spouse.  He is to be taken to the [facility] emergency department for further evaluation.  Patient is advised to hold his Eliquis until further notice.  Likewise, his last dose of Ozempic was 6 days ago; he is to hold this agent until instructed otherwise.  Further recommendations to follow.  Triage desk notified of patient's impending arrival.     Notice: This dictation was prepared with Dragon dictation along with smaller phrase technology. Any transcriptional errors that result from this process are unintentional and may not be corrected upon review.

## 2022-06-05 NOTE — Assessment & Plan Note (Signed)
-  no acute flare currently -continue allopurinol

## 2022-06-06 ENCOUNTER — Telehealth: Payer: Self-pay | Admitting: Cardiology

## 2022-06-06 DIAGNOSIS — K921 Melena: Secondary | ICD-10-CM | POA: Diagnosis not present

## 2022-06-06 LAB — CBC
HCT: 43.6 % (ref 39.0–52.0)
HCT: 45.5 % (ref 39.0–52.0)
Hemoglobin: 14.6 g/dL (ref 13.0–17.0)
Hemoglobin: 15.1 g/dL (ref 13.0–17.0)
MCH: 30.2 pg (ref 26.0–34.0)
MCH: 30.3 pg (ref 26.0–34.0)
MCHC: 33.5 g/dL (ref 30.0–36.0)
MCHC: 33.2 g/dL (ref 30.0–36.0)
MCV: 90.1 fL (ref 80.0–100.0)
MCV: 91.4 fL (ref 80.0–100.0)
Platelets: 205 10*3/uL (ref 150–400)
Platelets: 217 10*3/uL (ref 150–400)
RBC: 4.84 MIL/uL (ref 4.22–5.81)
RBC: 4.98 MIL/uL (ref 4.22–5.81)
RDW: 14.1 % (ref 11.5–15.5)
RDW: 14.3 % (ref 11.5–15.5)
WBC: 13.7 10*3/uL — ABNORMAL HIGH (ref 4.0–10.5)
WBC: 13 10*3/uL — ABNORMAL HIGH (ref 4.0–10.5)
nRBC: 0 % (ref 0.0–0.2)
nRBC: 0 % (ref 0.0–0.2)

## 2022-06-06 LAB — BASIC METABOLIC PANEL
Anion gap: 7 (ref 5–15)
BUN: 17 mg/dL (ref 8–23)
CO2: 27 mmol/L (ref 22–32)
Calcium: 8.3 mg/dL — ABNORMAL LOW (ref 8.9–10.3)
Chloride: 105 mmol/L (ref 98–111)
Creatinine, Ser: 1.32 mg/dL — ABNORMAL HIGH (ref 0.61–1.24)
GFR, Estimated: 57 mL/min — ABNORMAL LOW (ref >60)
Glucose, Bld: 96 mg/dL (ref 70–99)
Potassium: 3.1 mmol/L — ABNORMAL LOW (ref 3.5–5.1)
Sodium: 139 mmol/L (ref 135–145)

## 2022-06-06 LAB — GLUCOSE, CAPILLARY
Glucose-Capillary: 119 mg/dL — ABNORMAL HIGH (ref 70–99)
Glucose-Capillary: 106 mg/dL — ABNORMAL HIGH (ref 70–99)
Glucose-Capillary: 148 mg/dL — ABNORMAL HIGH (ref 70–99)
Glucose-Capillary: 148 mg/dL — ABNORMAL HIGH (ref 70–99)
Glucose-Capillary: 136 mg/dL — ABNORMAL HIGH (ref 70–99)

## 2022-06-06 LAB — LACTIC ACID, PLASMA: Lactic Acid, Venous: 1.5 mmol/L (ref 0.5–1.9)

## 2022-06-06 MED ORDER — HYDRALAZINE HCL 20 MG/ML IJ SOLN
10.0000 mg | Freq: Four times a day (QID) | INTRAMUSCULAR | Status: DC | PRN
  Administered 2022-06-07: 10 mg via INTRAVENOUS
  Filled 2022-06-06: qty 1

## 2022-06-06 MED ORDER — INSULIN GLARGINE-YFGN 100 UNIT/ML ~~LOC~~ SOLN
8.0000 [IU] | Freq: Every day | SUBCUTANEOUS | Status: DC
  Administered 2022-06-06: 8 [IU] via SUBCUTANEOUS
  Filled 2022-06-06 (×2): qty 0.08

## 2022-06-06 MED ORDER — POTASSIUM CHLORIDE CRYS ER 20 MEQ PO TBCR
40.0000 meq | EXTENDED_RELEASE_TABLET | ORAL | Status: AC
  Administered 2022-06-06 (×2): 40 meq via ORAL
  Filled 2022-06-06 (×2): qty 2

## 2022-06-06 MED ORDER — DILTIAZEM HCL 25 MG/5ML IV SOLN
10.0000 mg | Freq: Once | INTRAVENOUS | Status: AC
  Administered 2022-06-06: 10 mg via INTRAVENOUS
  Filled 2022-06-06: qty 5

## 2022-06-06 NOTE — Progress Notes (Signed)
Patient without c/o pain today, diet advanced to soft and tolerated well.  Patient aware not to eat or drink after midnight for procedure tomorrow, consent signed and on chart

## 2022-06-06 NOTE — Telephone Encounter (Signed)
[name] notified and verbalized understanding.

## 2022-06-06 NOTE — Progress Notes (Signed)
Manual BP at 175/100; patient is asymptomatic; notified MD; advised we will continue to monitor. No additional action at this time.

## 2022-06-06 NOTE — Progress Notes (Signed)
  Transition of Care [facility]) Screening Note   Patient Details  Name: Benjamin [name] Date of Birth: [birth year]   Transition of Care [facility]) CM/SW Contact:    Ihor Gully, LCSW Phone Number: 06/06/2022, 10:46 AM    Transition of Care Department [facility]) has reviewed patient and no TOC needs have been identified at this time. We will continue to monitor patient advancement through interdisciplinary progression rounds. If new patient transition needs arise, please place a TOC consult.   Your notification ID is: [phone]

## 2022-06-06 NOTE — H&P (View-Only) (Signed)
Subjective: Patient reports that he wants Dr. Domenic Polite involved in his care as his BP has been very elevated here and he is concerned about this. States BP has been low at home and has had some changes in his medications due to this, BP has been elevated for the past 4 days.   He does report that he had a BM this morning that was normal, states no presence of BRB or melena. Denies any abdominal pain, nausea or vomiting. He reports that appetite has not been great recently, he has lost about 30 pounds over the past 8 months without trying. Denies NSAID use. States that he is hungry today.   Objective: Vital signs in last 24 hours: Temp:  [97.7 F (36.5 C)-98.5 F (36.9 C)] 98.2 F (36.8 C) (09/20 0348) Pulse Rate:  [36-85] 77 (09/20 0348) Resp:  [0-18] 16 (09/20 0348) BP: (130-183)/(74-111) 180/102 (09/20 0348) SpO2:  [93 %-100 %] 96 % (09/20 0348) Weight:  [113.4 kg-115 kg] 115 kg (09/20 0430) Last BM Date : 06/05/22 General:   Alert and oriented, pleasant Head:  Normocephalic and atraumatic. Eyes:  No icterus, sclera clear. Conjuctiva pink.  Mouth:  Without lesions, mucosa pink and moist.  Heart:  S1, S2 present, no murmurs noted.  Lungs: Clear to auscultation bilaterally, without wheezing, rales, or rhonchi.  Abdomen:  Bowel sounds present, soft, non-tender, non-distended. No HSM or hernias noted. No rebound or guarding. No masses appreciated  Msk:  Symmetrical without gross deformities. Normal posture. Pulses:  Normal pulses noted. Extremities:  Without clubbing or edema. Neurologic:  Alert and  oriented x4;  grossly normal neurologically. Skin:  Warm and dry, intact without significant lesions.  Psych:  Alert and cooperative. Normal mood and affect.  Intake/Output from previous day: 09/19 0701 - 09/20 0700 In: 2511.8 [I.V.:1462.8; IV Piggyback:1049] Out: 1526 [Urine:1526] Intake/Output this shift: No intake/output data recorded.  Lab Results: Recent Labs     06/05/22 1210 06/05/22 1818 06/06/22 0353  WBC 11.1* 11.4* 13.7*  HGB 15.1 13.7 14.6  HCT 46.3 41.1 43.6  PLT 201 193 205   BMET Recent Labs    06/05/22 1210 06/06/22 0353  NA 138 139  K 3.8 3.1*  CL 102 105  CO2 26 27  GLUCOSE 252* 96  BUN 25* 17  CREATININE 1.65* 1.32*  CALCIUM 8.5* 8.3*   LFT Recent Labs    06/05/22 1210  PROT 7.4  ALBUMIN 3.8  AST 24  ALT 26  ALKPHOS 122  BILITOT 0.8   PT/INR Recent Labs    06/05/22 1215  LABPROT 15.2  INR 1.2    Assessment: Benjamin [name] is a 71 year old male with history of a fib, chronically anticoagulated on Eliquis, who was seen in the GI office yesterday for 3 days of melena and presyncope. Sent to ED for further evaluation.  Melena: patient denies NSAID use, is maintained on PPI BID as outpatient. hgb 15.1 on Arrival to ED, and remains stable at 14.6 today, BUN initially elevated to 25, though creatnine also elevated, BUN 17 this morning. last dose of Eliquis was yesterday morning. Last colonoscopy in 2022 with diverticulosis. Concern for UGI bleed, plan for EGD on 9/21 given last dose of AC was yesterday morning. Patient started on PPI BID. Patient feeling okay today, 1 normal BM without melena or BRB. Denies abdominal pain, nausea, or vomiting. Plan for EGD tomorrow for further evaluation of melena, consider givens capsule placement if EGD unremarkable. Indications, risks and benefits of  procedure discussed in detail with patient. Patient verbalized understanding and is in agreement to proceed with EGD.     Plan: Continue PPI BID Monitor for overt GI Bleeding Continue to hold Eliquis Trend H&H, transfuse for hgb <7 Soft diet today, NPO at midnight EGD 9/21   LOS: 0 days    06/06/2022, 8:59 AM  Benjamin Lapinsky L. Alver Sorrow, MSN, APRN, AGNP-C Adult-Gerontology Nurse Practitioner [facility]

## 2022-06-06 NOTE — Progress Notes (Signed)
PROGRESS NOTE     Benjamin [name], is a 71 y.o. male, DOB - May 02, 1951, PJK:[id number]  Admit date - 06/05/2022   Admitting Physician Barton Dubois, MD  Outpatient Primary MD for the patient is [facility], [facility]  LOS - 0  Chief Complaint  Patient presents with   Rectal Bleeding        Brief Narrative:  71 y.o. male with medical history significant of gout, essential hypertension, hyperlipidemia, type 2 diabetes with nephropathy, chronic kidney disease stage III A, class I obesity and atrial fibrillation (on  Eliquis)-admitted on 06/05/2022 which dizziness/near syncopal episode and concerns for GI bleed in the setting of melena/black stools    -Assessment and Plan: 1)Gastrointestinal hemorrhage with melena -Patient states that he has had melanotic stools for couple weeks now Had dizziness with presyncope/prior syncope events-- -No NSAIDs or aspirin use, but chronically on Eliquis due to Afib. --Hold Eliquis- last dose am of 06/05/22 -c/n IV Protonix q12 hrs -GI consult appreciated plans for possible EGD on 06/07/2022 after Eliquis washout period Hgb -currently stable around baseline (hgb ~ 14) -No documented occult stool blood testing noted  2)Persistent/permanent atrial fibrillation--history of prior stroke -Eliquis on hold as above #1 This patients CHA2DS2-VASc Score and unadjusted Ischemic Stroke Rate (% per year) is equal to 9.7 % stroke rate/year from a score of 6  -Verified with central telemetry that patient is currently in atrial fibrillation with heart rate mostly in the 60s -Continue metoprolol 50 twice daily--please see progress note from cardiologist dated 04/11/2022  3)HTN--stage II, episodes of elevated BP noted -Chart review reveals that patient was previously on amlodipine and Lotensin and both were discontinued previously by PCP due to soft/low BPs -May use IV hydralazine as needed limited BP at this time, otherwise continue metoprolol 50 twice daily--- please  see cardiologist notes from 04/11/2022 -Patient will follow-up postdischarge with cardiologist for further BP medication adjustments as needed  4)CKD stage -3A -Renal function actually improved since admission    creatinine on admission= 1.65 ,  baseline creatinine = 1.5 to 1.7   ,  creatinine is now= 1.32  ,  --renally adjust medications, avoid nephrotoxic agents / dehydration  / hypotension  5) hypokalemia----denies vomiting or diarrhea,  -Replace potassium  6)DM2-A1C 6.8 reflecting fair diabetic control PTA -Currently on Lantus 15 units nightly along with sliding scale coverage -Will reduce Lantus to 8 units tonight as patient will be n.p.o. in a.m. for endoscopy  7)Class 1 Obesity-patient reports ongoing weight loss appears to be intentional -Low calorie diet, portion control and increase physical activity discussed with patient -Body mass index is 32.56 kg/m.  8) dizziness/near syncope-please see  #1 and #2 above --Verified with central telemetry that patient is currently in atrial fibrillation with heart rate mostly in the 60s---otherwise no significant arrhythmias on telemetry - occasional bradycardia noted -Hgb appears stable - echo from April 12, 2022 with EF of 60 to 65% without significant aortic stenosis or mitral stenosis -Random cortisol level at 2 PM was 5.9 which is normal -TSH and magnesium WNL -Phosphorous is borderline low, replace -Suspect dizziness and near syncope dehydration related as patient had elevated lactic acid of 2.4,, elevated glucose of 252, elevated BUN of 25, and elevated creatinine of 1.65, -With hydration creatinine is down to 1.32 BUN is down to 25 and GFR is up to 57 from 44, glucose is down to 96 from 252  9)Gout -no acute flare currently -continue allopurinol   10)Hyperlipidemia -continue zetia.  11)Chronic leukocytosis--dating  back to 2018... , Patient is strongly advised to follow-up with oncologist postdischarge for further  work-up -No evidence of active infectious process at this time  Disposition/Need for in-Hospital Stay- patient unable to be discharged at this time due to --- awaiting endoluminal evaluation given melanotic stools in the setting of chronic Eliquis use  Disposition: The patient is from: Home              Anticipated d/c is to: Home              Anticipated d/c date is: 1 day              Patient currently is not medically stable to d/c. Barriers: Not Clinically Stable-   Code Status :  -  Code Status: Full Code   Family Communication:    NA (patient is alert, awake and coherent)   DVT Prophylaxis  :   - SCDs  SCDs Start: 06/05/22 1413 ///GI bleed concerns   Lab Results  Component Value Date   PLT 205 06/06/2022    Inpatient Medications  Scheduled Meds:  allopurinol  100 mg Oral Daily   DULoxetine  30 mg Oral Daily   ezetimibe  10 mg Oral Daily   insulin aspart  0-15 Units Subcutaneous TID WC   insulin glargine-yfgn  8 Units Subcutaneous QHS   metoprolol tartrate  50 mg Oral BID   pantoprazole (PROTONIX) IV  40 mg Intravenous Q12H   timolol  1 drop Left Eye BID   Continuous Infusions: PRN Meds:.acetaminophen **OR** acetaminophen, hydrALAZINE, ondansetron **OR** ondansetron (ZOFRAN) IV   Anti-infectives (From admission, onward)    None         Subjective: Benjamin [name] today has no fevers, no emesis,  No chest pain,   RN MaryAnn at bedside -Patient states he had formed brown stool -Denies dizziness, no  palpitations , no shortness of breath -Denies abdominal pain No fever  Or chills , no productive cough  Objective: Vitals:   06/06/22 0348 06/06/22 0430 06/06/22 1344 06/06/22 1503  BP: (!) 180/102  (!) 138/124 (!) 165/84  Pulse: 77  (!) 54 (!) 52  Resp: 16  19   Temp: 98.2 F (36.8 C)  98.6 F (37 C)   TempSrc: Oral     SpO2: 96%  95%   Weight:  115 kg    Height:        Intake/Output Summary (Last 24 hours) at 06/06/2022 1533 Last data filed at  06/06/2022 0630 Gross per 24 hour  Intake 1462.8 ml  Output 1526 ml  Net -63.2 ml   Filed Weights   06/05/22 1122 06/06/22 0430  Weight: 113.4 kg 115 kg    Physical Exam  Gen:- Awake Alert, no acute distress HEENT:- [place].AT, No sclera icterus Neck-Supple Neck,No JVD,.  Lungs-  CTAB , fair symmetrical air movement CV- S1, S2 normal, irregularly irregular Abd-  +ve B.Sounds, Abd Soft, No tenderness,    Extremity/Skin:- No  edema, pedal pulses present  Psych-affect is appropriate, oriented x3 Neuro-no new focal deficits, no tremors  Data Reviewed: I have personally reviewed following labs and imaging studies  CBC: Recent Labs  Lab 06/05/22 1210 06/05/22 1818 06/06/22 0353  WBC 11.1* 11.4* 13.7*  HGB 15.1 13.7 14.6  HCT 46.3 41.1 43.6  MCV 91.7 91.1 90.1  PLT 201 193 263   Basic Metabolic Panel: Recent Labs  Lab 06/05/22 1210 06/05/22 1414 06/06/22 0353  NA 138  --  139  K  3.8  --  3.1*  CL 102  --  105  CO2 26  --  27  GLUCOSE 252*  --  96  BUN 25*  --  17  CREATININE 1.65*  --  1.32*  CALCIUM 8.5*  --  8.3*  MG  --  2.1  --   PHOS  --  2.4*  --    GFR: Estimated Creatinine Clearance: 68.2 mL/min (A) (by C-G formula based on SCr of 1.32 mg/dL (H)). Liver Function Tests: Recent Labs  Lab 06/05/22 1210  AST 24  ALT 26  ALKPHOS 122  BILITOT 0.8  PROT 7.4  ALBUMIN 3.8   HbA1C: Recent Labs    06/05/22 1412  HGBA1C 6.8*   Radiology Studies: No results found.   Scheduled Meds:  allopurinol  100 mg Oral Daily   DULoxetine  30 mg Oral Daily   ezetimibe  10 mg Oral Daily   insulin aspart  0-15 Units Subcutaneous TID WC   insulin glargine-yfgn  8 Units Subcutaneous QHS   metoprolol tartrate  50 mg Oral BID   pantoprazole (PROTONIX) IV  40 mg Intravenous Q12H   timolol  1 drop Left Eye BID   Continuous Infusions:   LOS: 0 days    Roxan Hockey M.D on 06/06/2022 at 3:33 PM  Go to [email or web address] - for contact info  [facility] -  Office  [phone]  If 7PM-7AM, please contact night-coverage [email or web address] 06/06/2022, 3:33 PM

## 2022-06-06 NOTE — Progress Notes (Signed)
Subjective: Patient reports that he wants Dr. Domenic Polite involved in his care as his BP has been very elevated here and he is concerned about this. States BP has been low at home and has had some changes in his medications due to this, BP has been elevated for the past 4 days.   He does report that he had a BM this morning that was normal, states no presence of BRB or melena. Denies any abdominal pain, nausea or vomiting. He reports that appetite has not been great recently, he has lost about 30 pounds over the past 8 months without trying. Denies NSAID use. States that he is hungry today.   Objective: Vital signs in last 24 hours: Temp:  [97.7 F (36.5 C)-98.5 F (36.9 C)] 98.2 F (36.8 C) (09/20 0348) Pulse Rate:  [36-85] 77 (09/20 0348) Resp:  [0-18] 16 (09/20 0348) BP: (130-183)/(74-111) 180/102 (09/20 0348) SpO2:  [93 %-100 %] 96 % (09/20 0348) Weight:  [113.4 kg-115 kg] 115 kg (09/20 0430) Last BM Date : 06/05/22 General:   Alert and oriented, pleasant Head:  Normocephalic and atraumatic. Eyes:  No icterus, sclera clear. Conjuctiva pink.  Mouth:  Without lesions, mucosa pink and moist.  Heart:  S1, S2 present, no murmurs noted.  Lungs: Clear to auscultation bilaterally, without wheezing, rales, or rhonchi.  Abdomen:  Bowel sounds present, soft, non-tender, non-distended. No HSM or hernias noted. No rebound or guarding. No masses appreciated  Msk:  Symmetrical without gross deformities. Normal posture. Pulses:  Normal pulses noted. Extremities:  Without clubbing or edema. Neurologic:  Alert and  oriented x4;  grossly normal neurologically. Skin:  Warm and dry, intact without significant lesions.  Psych:  Alert and cooperative. Normal mood and affect.  Intake/Output from previous day: 09/19 0701 - 09/20 0700 In: 2511.8 [I.V.:1462.8; IV Piggyback:1049] Out: 1526 [Urine:1526] Intake/Output this shift: No intake/output data recorded.  Lab Results: Recent Labs     06/05/22 1210 06/05/22 1818 06/06/22 0353  WBC 11.1* 11.4* 13.7*  HGB 15.1 13.7 14.6  HCT 46.3 41.1 43.6  PLT 201 193 205   BMET Recent Labs    06/05/22 1210 06/06/22 0353  NA 138 139  K 3.8 3.1*  CL 102 105  CO2 26 27  GLUCOSE 252* 96  BUN 25* 17  CREATININE 1.65* 1.32*  CALCIUM 8.5* 8.3*   LFT Recent Labs    06/05/22 1210  PROT 7.4  ALBUMIN 3.8  AST 24  ALT 26  ALKPHOS 122  BILITOT 0.8   PT/INR Recent Labs    06/05/22 1215  LABPROT 15.2  INR 1.2    Assessment: Benjamin [name] is a 71 year old male with history of a fib, chronically anticoagulated on Eliquis, who was seen in the GI office yesterday for 3 days of melena and presyncope. Sent to ED for further evaluation.  Melena: patient denies NSAID use, is maintained on PPI BID as outpatient. hgb 15.1 on Arrival to ED, and remains stable at 14.6 today, BUN initially elevated to 25, though creatnine also elevated, BUN 17 this morning. last dose of Eliquis was yesterday morning. Last colonoscopy in 2022 with diverticulosis. Concern for UGI bleed, plan for EGD on 9/21 given last dose of AC was yesterday morning. Patient started on PPI BID. Patient feeling okay today, 1 normal BM without melena or BRB. Denies abdominal pain, nausea, or vomiting. Plan for EGD tomorrow for further evaluation of melena, consider givens capsule placement if EGD unremarkable. Indications, risks and benefits of  procedure discussed in detail with patient. Patient verbalized understanding and is in agreement to proceed with EGD.     Plan: Continue PPI BID Monitor for overt GI Bleeding Continue to hold Eliquis Trend H&H, transfuse for hgb <7 Soft diet today, NPO at midnight EGD 9/21   LOS: 0 days    06/06/2022, 8:59 AM  Sharonica Kraszewski L. Alver Sorrow, MSN, APRN, AGNP-C Adult-Gerontology Nurse Practitioner [facility]

## 2022-06-06 NOTE — Telephone Encounter (Signed)
[name] came in the office stating that the Pt is currently admitted to [facility] for unknown cause of bleeding and high blood pressure. The Dr. Parks Ranger is seeing the PT is wanting to change his blood pressure medications and the PT does not want to change anything without consulting Dr. Domenic Polite first. [name] does not know the attending Dr's name but she stated that she saw him in the past and he messed up her heart medications.

## 2022-06-07 ENCOUNTER — Encounter (HOSPITAL_COMMUNITY): Payer: Self-pay | Admitting: Internal Medicine

## 2022-06-07 ENCOUNTER — Telehealth: Payer: Self-pay | Admitting: Gastroenterology

## 2022-06-07 ENCOUNTER — Encounter (HOSPITAL_COMMUNITY)
Admission: EM | Disposition: A | Discharge status: Home / Self Care | Payer: Self-pay | Source: Home / Self Care | Attending: Emergency Medicine

## 2022-06-07 ENCOUNTER — Observation Stay (HOSPITAL_COMMUNITY): Payer: No Typology Code available for payment source | Admitting: Anesthesiology

## 2022-06-07 ENCOUNTER — Observation Stay (HOSPITAL_BASED_OUTPATIENT_CLINIC_OR_DEPARTMENT_OTHER): Payer: No Typology Code available for payment source | Admitting: Anesthesiology

## 2022-06-07 DIAGNOSIS — K2971 Gastritis, unspecified, with bleeding: Secondary | ICD-10-CM | POA: Diagnosis not present

## 2022-06-07 DIAGNOSIS — E1122 Type 2 diabetes mellitus with diabetic chronic kidney disease: Secondary | ICD-10-CM

## 2022-06-07 DIAGNOSIS — R1310 Dysphagia, unspecified: Secondary | ICD-10-CM | POA: Diagnosis not present

## 2022-06-07 DIAGNOSIS — K317 Polyp of stomach and duodenum: Secondary | ICD-10-CM

## 2022-06-07 DIAGNOSIS — I129 Hypertensive chronic kidney disease with stage 1 through stage 4 chronic kidney disease, or unspecified chronic kidney disease: Secondary | ICD-10-CM | POA: Diagnosis not present

## 2022-06-07 DIAGNOSIS — K2970 Gastritis, unspecified, without bleeding: Secondary | ICD-10-CM | POA: Diagnosis not present

## 2022-06-07 DIAGNOSIS — Z7984 Long term (current) use of oral hypoglycemic drugs: Secondary | ICD-10-CM

## 2022-06-07 DIAGNOSIS — K921 Melena: Secondary | ICD-10-CM | POA: Diagnosis not present

## 2022-06-07 DIAGNOSIS — Z794 Long term (current) use of insulin: Secondary | ICD-10-CM

## 2022-06-07 HISTORY — PX: ESOPHAGOGASTRODUODENOSCOPY (EGD) WITH PROPOFOL: SHX5813

## 2022-06-07 HISTORY — PX: POLYPECTOMY: SHX5525

## 2022-06-07 LAB — CBC
HCT: 46.1 % (ref 39.0–52.0)
Hemoglobin: 15.6 g/dL (ref 13.0–17.0)
MCH: 30.5 pg (ref 26.0–34.0)
MCHC: 33.8 g/dL (ref 30.0–36.0)
MCV: 90 fL (ref 80.0–100.0)
Platelets: 202 10*3/uL (ref 150–400)
RBC: 5.12 MIL/uL (ref 4.22–5.81)
RDW: 14.2 % (ref 11.5–15.5)
WBC: 14.7 10*3/uL — ABNORMAL HIGH (ref 4.0–10.5)
nRBC: 0 % (ref 0.0–0.2)

## 2022-06-07 LAB — BASIC METABOLIC PANEL
Anion gap: 7 (ref 5–15)
BUN: 17 mg/dL (ref 8–23)
CO2: 26 mmol/L (ref 22–32)
Calcium: 8.6 mg/dL — ABNORMAL LOW (ref 8.9–10.3)
Chloride: 106 mmol/L (ref 98–111)
Creatinine, Ser: 1.4 mg/dL — ABNORMAL HIGH (ref 0.61–1.24)
GFR, Estimated: 53 mL/min — ABNORMAL LOW (ref >60)
Glucose, Bld: 141 mg/dL — ABNORMAL HIGH (ref 70–99)
Potassium: 3.3 mmol/L — ABNORMAL LOW (ref 3.5–5.1)
Sodium: 139 mmol/L (ref 135–145)

## 2022-06-07 LAB — GLUCOSE, CAPILLARY
Glucose-Capillary: 133 mg/dL — ABNORMAL HIGH (ref 70–99)
Glucose-Capillary: 112 mg/dL — ABNORMAL HIGH (ref 70–99)
Glucose-Capillary: 119 mg/dL — ABNORMAL HIGH (ref 70–99)

## 2022-06-07 SURGERY — ESOPHAGOGASTRODUODENOSCOPY (EGD) WITH PROPOFOL
Anesthesia: General

## 2022-06-07 MED ORDER — PROPOFOL 10 MG/ML IV BOLUS
INTRAVENOUS | Status: DC | PRN
  Administered 2022-06-07: 20 mg via INTRAVENOUS
  Administered 2022-06-07: 80 mg via INTRAVENOUS
  Administered 2022-06-07 (×2): 20 mg via INTRAVENOUS
  Administered 2022-06-07: 30 mg via INTRAVENOUS

## 2022-06-07 MED ORDER — POTASSIUM CHLORIDE CRYS ER 20 MEQ PO TBCR
40.0000 meq | EXTENDED_RELEASE_TABLET | ORAL | Status: AC
  Administered 2022-06-07: 40 meq via ORAL
  Filled 2022-06-07: qty 2

## 2022-06-07 MED ORDER — LACTATED RINGERS IV SOLN: INTRAVENOUS | Status: DC | PRN

## 2022-06-07 MED ORDER — LIDOCAINE HCL (CARDIAC) PF 100 MG/5ML IV SOSY
PREFILLED_SYRINGE | INTRAVENOUS | Status: DC | PRN
  Administered 2022-06-07: 60 mg via INTRATRACHEAL

## 2022-06-07 MED ORDER — APIXABAN 5 MG PO TABS: 5.0000 mg | ORAL_TABLET | Freq: Two times a day (BID) | ORAL | 1 refills | Status: AC

## 2022-06-07 MED ORDER — LACTATED RINGERS IV SOLN: INTRAVENOUS | Status: DC

## 2022-06-07 MED ORDER — PANTOPRAZOLE SODIUM 40 MG PO TBEC: 40.0000 mg | DELAYED_RELEASE_TABLET | Freq: Two times a day (BID) | ORAL | 4 refills | Status: AC

## 2022-06-07 MED ORDER — SODIUM CHLORIDE 0.9 % IV SOLN: INTRAVENOUS | Status: DC

## 2022-06-07 NOTE — Anesthesia Preprocedure Evaluation (Signed)
Anesthesia Evaluation  Patient identified by MRN, date of birth, ID band Patient awake    Reviewed: Allergy & Precautions, H&P , NPO status , Patient's Chart, lab work & pertinent test results, reviewed documented beta blocker date and time   Airway Mallampati: II  TM Distance: >3 FB Neck ROM: full    Dental no notable dental hx.    Pulmonary sleep apnea , former smoker,    Pulmonary exam normal breath sounds clear to auscultation       Cardiovascular Exercise Tolerance: Good hypertension, negative cardio ROS   Rhythm:regular Rate:Normal     Neuro/Psych CVA negative psych ROS   GI/Hepatic negative GI ROS, Neg liver ROS,   Endo/Other  negative endocrine ROSdiabetes  Renal/GU CRFRenal disease  negative genitourinary   Musculoskeletal   Abdominal   Peds  Hematology negative hematology ROS (+)   Anesthesia Other Findings   Reproductive/Obstetrics negative OB ROS                             Anesthesia Physical Anesthesia Plan  ASA: 4 and emergent  Anesthesia Plan: General   Post-op Pain Management:    Induction:   PONV Risk Score and Plan: Propofol infusion  Airway Management Planned:   Additional Equipment:   Intra-op Plan:   Post-operative Plan:   Informed Consent: I have reviewed the patients History and Physical, chart, labs and discussed the procedure including the risks, benefits and alternatives for the proposed anesthesia with the patient or authorized representative who has indicated his/her understanding and acceptance.     Dental Advisory Given  Plan Discussed with: CRNA  Anesthesia Plan Comments:         Anesthesia Quick Evaluation

## 2022-06-07 NOTE — Discharge Summary (Signed)
Benjamin [name], is a 71 y.o. male  DOB 1951/05/16  MRN [id number].  Admission date:  06/05/2022  Admitting Physician  Barton Dubois, MD  Discharge Date:  06/07/2022   Primary MD  [facility], [facility]  Recommendations for primary care physician for things to follow:   1)Please hold Eliquis/apixaban until Friday, 06/08/2022 2) please follow-up with Dr. Domenic Polite for adjustment of your blood pressure medications----for now please take your blood pressures medications as you were previously instructed by Dr. Domenic Polite during your July visit earlier this year 3)Avoid ibuprofen/Advil/Aleve/Motrin/Goody Powders/Naproxen/BC powders/Meloxicam/Diclofenac/Indomethacin and other Nonsteroidal anti-inflammatory medications as these will make you more likely to bleed and can cause stomach ulcers, can also cause Kidney problems.  4)Please check your blood pressure once to twice every day and keep a diary of your blood pressure readings and take this with you to your visit with Dr. Domenic Polite 5)Repeat CBC and BMP blood test advised in about a week from now 6) please call gastroenterologist Dr. Hurshel Keys with [facility]--- to get results of your biopsies and follow-up information -address: [address], [place], [place] [address], Phone: [phone] 7)Chronic leukocytosis--elevated white blood count dating back to 2018... , Patient is strongly advised to follow-up with oncologist postdischarge for further work-up -No evidence of active infectious process at this time  Admission Diagnosis  Gastrointestinal hemorrhage with melena [K92.1]   Discharge Diagnosis  Gastrointestinal hemorrhage with melena [K92.1]    Principal Problem:   Gastrointestinal hemorrhage with melena Active Problems:   Obesity   Essential hypertension, benign   Hyperlipidemia   DM (diabetes mellitus) ([place])   Gout   CKD  (chronic kidney disease)   Atrial fibrillation, chronic (HCC)   Syncope      Past Medical History:  Diagnosis Date   Arthritis    Essential hypertension    Gout    History of stroke    Right occipital May 2018   Hypertensive heart disease    LVH with grade 2 diastolic dysfunction   Mixed hyperlipidemia    Statin intolerance   Rotator cuff tear    Chronic pain   Sleep apnea    a. intolerant to CPAP   Stroke ([place]) 10/1998   TIA   Tubular adenoma    Type 2 diabetes mellitus ([place])     Past Surgical History:  Procedure Laterality Date   APPENDECTOMY     COLONOSCOPY N/A 10/29/2013   Dr. [name]- sigmoid polyp status post cold snare removal. large sessile ascending colon polyp. debulked with saline assisted snare polypectomy . pancolonic diverticulosis. inadequate prep. tubular adenoma on bx   COLONOSCOPY N/A 02/22/2014   Dr. [name]: Saline-assisted snare polypectomy/biopsy for sprawling carpet polyp in the ascending colon which could not be completely removed. Status post biopsy (tubular adenoma), tattooed   COLONOSCOPY N/A 03/01/2014   MBW:GYKZ polypectomy hemorrhage s/p bleeding   COLONOSCOPY N/A 06/08/2015   Procedure: COLONOSCOPY;  Surgeon: [name], MD;  [facility];  Service: Endoscopy;  Laterality: N/A;  0900  COLONOSCOPY WITH PROPOFOL N/A 11/14/2020   Procedure: COLONOSCOPY WITH PROPOFOL;  Surgeon: [name], MD;  [facility];  Service: Endoscopy;  Laterality: N/A;  8:15am   ESOPHAGOGASTRODUODENOSCOPY N/A 02/22/2014   Dr. [name]: Erosive reflux esophagitis. Schatzki ring status post dilation. Gastric and duodenal erosions with benign biopsies.   LAPAROSCOPIC RIGHT COLECTOMY N/A 04/16/2014   Procedure: LAPAROSCOPIC ASSISTED RIGHT COLECTOMY;  Surgeon: [name], MD;  Location: [facility];  Service: General;  Laterality: N/A;   MALONEY DILATION N/A 02/22/2014   Procedure: [name];  Surgeon: [name], MD;  [facility];   Service: Endoscopy;  Laterality: N/A;   SAVORY DILATION N/A 02/22/2014   Procedure: SAVORY DILATION;  Surgeon: [name], MD;  [facility];  Service: Endoscopy;  Laterality: N/A;       HPI  from the history and physical done on the day of admission:   HPI: Benjamin [name] is a 71 y.o. male with medical history significant of gout, essential hypertension, hyperlipidemia, type 2 diabetes with nephropathy, chronic kidney disease stage III yea, class I obesity and atrial fibrillation (on chronic Eliquis); who presented to the hospital secondary to melanotic stools and complains of near syncope/syncope events that have been ongoing for approximately 3-4 weeks.  Patient reports feeling occasionally dizzy/lightheaded and has not really in the passing out.  At bedside family reports that his antihypertensive agents has been adjusted in time to minimize the chances of passing out, without signs of success. Patient has reported the presence of black stools and presented to the GI office for further evaluation and management.  He was sent to the emergency department to rule out hemodynamical unstability and further evaluation and management.   Patient reports no chest pain, no shortness of breath, no fever, no nausea, vomiting or abdominal pain.  There is no dysuria or hematuria.  Patient expressed no use of NSAIDs and is chronically on PPI orally twice a day.   Work-up demonstrated Melanotic stool on rectal exam; stable hemoglobin trend in the 15 range.  There is mild elevation of BUN at 25 with a stable creatinine of 1.6.  Patient with chronic renal failure.   Lactic acid 2.4.  IV fluids, type and screen and [facility] service consulted for patient to be admitted in order to pursuit further evaluation and management.     Review of Systems: As mentioned in the history of present illness. All other systems reviewed and are negative.    Hospital Course:     Brief Narrative:  71 y.o. male with  medical history significant of gout, essential hypertension, hyperlipidemia, type 2 diabetes with nephropathy, chronic kidney disease stage III A, class I obesity and atrial fibrillation (on  Eliquis)-admitted on 06/05/2022 which dizziness/near syncopal episode and concerns for GI bleed in the setting of melena/black stools     -Assessment and Plan: 1)Gastrointestinal hemorrhage with melena -Patient states that he has had melanotic stools for couple weeks PTA Had dizziness with presyncope/prior syncope events-- -No NSAIDs or aspirin use, but chronically on Eliquis due to Afib. -- last dose am of 06/05/22 -Treated with IV Protonix q12 hrs -GI consult appreciated  -Stop post EGD on 06/07/2022 after Eliquis washout period with finding of gastritis and single duodenal polyp which was resected Hgb -currently stable around baseline (hgb ~ 14) -No documented occult stool blood testing noted -Overall Hgb remains stable -Denies further dark stools -Discharge on PPI, outpatient follow-up with GI team advised -Avoidance  of NSAIDs advised   2)Persistent/permanent atrial fibrillation--history of prior stroke Okay to restart Eliquis on 06/08/2022 This patients CHA2DS2-VASc Score and unadjusted Ischemic Stroke Rate (% per year) is equal to 9.7 % stroke rate/year from a score of 6  -Verified with central telemetry that patient is currently in atrial fibrillation with heart rate mostly in the 60s -Continue metoprolol 50 twice daily--please see progress note from cardiologist dated 04/11/2022   3)HTN--stage II, episodes of elevated BP noted, BP Not at goal during this hospital stay -Chart review reveals that patient was previously on amlodipine and Lotensin and both were discontinued previously by PCP due to soft/low BPs -Patient tells me that he believes that episodes of elevated BP here in the hospital is due to whitecoat syndrome -Patient does Not want his blood pressure medications adjusted at this  time ---- please see cardiologist notes from 04/11/2022 -Patient wants to do metoprolol monotherapy for now -Patient will follow-up postdischarge with cardiologist for further BP medication adjustments as needed -Patient advised to keep a blood pressure diary and take this diary with him to cardiologist -Patient may benefit from continuous ambulatory BP monitoring device at home -Patient reminded that he is high risk for stroke given A-fib with comorbidities especially given prior history of strokes----adequate BP control will help to reduce his stroke risk   4)CKD stage -3A -Renal function actually improved since admission    creatinine on admission= 1.65 ,  baseline creatinine = 1.5 to 1.7   ,  creatinine is now= 1.40  ,  -Repeat BMP within a week advised --renally adjust medications, avoid nephrotoxic agents / dehydration  / hypotension   5)Hypokalemia----denies vomiting or diarrhea,  -Replaced potassium -Repeat BMP within a week advised   6)DM2-A1C 6.8 reflecting fair diabetic control PTA -Resume home diabetic regimen postdischarge   7)Class 1 Obesity-patient reports ongoing weight loss appears to be intentional -Low calorie diet, portion control and increase physical activity discussed with patient -Body mass index is 32.56 kg/m.   8) dizziness/near syncope-please see  #1 and #2 above --Verified with central telemetry that patient is currently in atrial fibrillation with heart rate mostly in the 60s---otherwise no significant arrhythmias on telemetry - occasional bradycardia noted -Hgb appears stable - echo from April 12, 2022 with EF of 60 to 65% without significant aortic stenosis or mitral stenosis -Random cortisol level at 2 PM was 5.9 which is normal -TSH and magnesium WNL -Phosphorous is borderline low, replace -Suspect dizziness and near syncope dehydration related as patient had elevated lactic acid of 2.4,, elevated glucose of 252, elevated BUN of 25, and elevated  creatinine of 1.65, -With hydration creatinine is down to 1.32 BUN is down to 25 and GFR is up to 57 from 44, glucose is down to 96 from 252 -Elevated BP may also be contributing to patient's dizzy spells as systolic BP was at times in the 190s here in the hospital   9)Gout -no acute flare currently -continue allopurinol    10)Hyperlipidemia -continue zetia.   11)Chronic leukocytosis--dating back to 2018... , Patient is strongly advised to follow-up with oncologist postdischarge for further work-up -No evidence of active infectious process at this time   Disposition: The patient is from: Home              Anticipated d/c is to: Home   Discharge Condition: stable  Follow UP   Follow-up Information     Satira Sark, MD. Schedule an appointment as soon as possible for a visit  in 1 week(s).   Specialty: Cardiology Why: For blood pressure recheck Contact information: [name] [address] [place], [place], DO. Schedule an appointment as soon as possible for a visit.   Specialty: Gastroenterology Contact information: [address] [place] [address] [phone]                  Consults obtained - Gi   Diet and Activity recommendation:  As advised  Discharge Instructions    Discharge Instructions     Call MD for:  difficulty breathing, headache or visual disturbances   Complete by: As directed    Call MD for:  persistant dizziness or light-headedness   Complete by: As directed    Call MD for:  persistant nausea and vomiting   Complete by: As directed    Call MD for:  temperature >100.4   Complete by: As directed    Diet - low sodium heart healthy   Complete by: As directed    Discharge instructions   Complete by: As directed    1)Please hold Eliquis/apixaban until Friday, 06/08/2022 2) please follow-up with Dr. Domenic Polite for adjustment of your blood pressure medications----for now please take your blood  pressures medications as you were previously instructed by Dr. Domenic Polite during your July visit earlier this year 3)Avoid ibuprofen/Advil/Aleve/Motrin/Goody Powders/Naproxen/BC powders/Meloxicam/Diclofenac/Indomethacin and other Nonsteroidal anti-inflammatory medications as these will make you more likely to bleed and can cause stomach ulcers, can also cause Kidney problems.  4)Please check your blood pressure once to twice every day and keep a diary of your blood pressure readings and take this with you to your visit with Dr. Domenic Polite 5)Repeat CBC and BMP blood test advised in about a week from now 6) please call gastroenterologist Dr. Hurshel Keys with [facility]--- to get results of your biopsies and follow-up information -address: [address] [place], [place] [address], Phone: [phone] 7)Chronic leukocytosis--elevated white blood count dating back to 2018... , Patient is strongly advised to follow-up with oncologist postdischarge for further work-up -No evidence of active infectious process at this time   Increase activity slowly   Complete by: As directed          Discharge Medications     Allergies as of 06/07/2022       Reactions   Niacin-lovastatin Er Other (See Comments)   ADVICOR= Body aches   Statins    Body aches   Tamsulosin    Other reaction(s): Low blood pressure   Zocor [simvastatin - High Dose] Other (See Comments)   Body aches.        Medication List     STOP taking these medications    losartan 100 MG tablet Commonly known as: COZAAR       TAKE these medications    acetaminophen 500 MG tablet Commonly known as: TYLENOL Take 1,000 mg by mouth every 6 (six) hours as needed.   allopurinol 100 MG tablet Commonly known as: ZYLOPRIM Take 100 mg by mouth daily.   apixaban 5 MG Tabs tablet Commonly known as: ELIQUIS Take 1 tablet (5 mg total) by mouth 2 (two) times daily. Start taking on: June 08, 2022    cholecalciferol 25 MCG (1000 UNIT) tablet Commonly known as: VITAMIN D3 Take 1,000 Units by mouth daily.   cyclobenzaprine 10 MG tablet Commonly known as: FLEXERIL Take 10 mg by mouth at bedtime.   DULoxetine 30 MG capsule Commonly known as:  CYMBALTA Take 30 mg by mouth daily.   empagliflozin 25 MG Tabs tablet Commonly known as: JARDIANCE Take 12.5 mg by mouth daily. Half tablet daily   ezetimibe 10 MG tablet Commonly known as: ZETIA TAKE ONE TABLET BY MOUTH DAILY FOR CHOLESTEROL   fexofenadine 60 MG tablet Commonly known as: ALLEGRA Take 60 mg by mouth daily.   guaifenesin 400 MG Tabs tablet Commonly known as: HUMIBID E TAKE ONE TABLET BY MOUTH TWICE A DAY AS NEEDED FOR CONGESTION   insulin glargine 100 UNIT/ML injection Commonly known as: LANTUS Inject 40 Units into the skin daily. IN THE MORNING   metoprolol tartrate 50 MG tablet Commonly known as: Lopressor Take 1 tablet (50 mg total) by mouth 2 (two) times daily.   OVER THE COUNTER MEDICATION Pain relieving cream   pantoprazole 40 MG tablet Commonly known as: PROTONIX Take 1 tablet (40 mg total) by mouth in the morning and at bedtime.   Semaglutide (1 MG/DOSE) 2 MG/1.5ML Sopn Inject 1 mg into the skin every Wednesday.   sodium chloride 0.65 % Soln nasal spray Commonly known as: OCEAN Place 1 spray into both nostrils as needed for congestion.   Tafluprost (PF) 0.0015 % Soln Place 1 drop into both eyes every evening.   Timolol Maleate PF 0.5 % Soln Place 1 drop into both eyes in the morning and at bedtime.        Major procedures and Radiology Reports - PLEASE review detailed and final reports for all details, in brief -   Micro Results   Today   Subjective    Benjamin [name] today has no new concerns  Denies further dizziness, -Tolerating oral intake well -No nausea or vomiting -No chest pains no palpitations no dyspnea on exertion          Patient has been seen and examined prior to  discharge   Objective   Blood pressure (!) 153/96, pulse (!) 57, temperature 97.8 F (36.6 C), temperature source Oral, resp. rate 18, height '6\' 2"'$  (1.88 m), weight 111.7 kg, SpO2 97 %.   Intake/Output Summary (Last 24 hours) at 06/07/2022 1552 Last data filed at 06/07/2022 1305 Gross per 24 hour  Intake 473.78 ml  Output --  Net 473.78 ml    Exam Gen:- Awake Alert, no acute distress  HEENT:- [place].AT, No sclera icterus Neck-Supple Neck,No JVD,.  Lungs-  CTAB , good air movement bilaterally CV- S1, S2 normal, irregularly irregular Abd-  +ve B.Sounds, Abd Soft, No tenderness,    Extremity/Skin:- No  edema,   good pulses Psych-affect is appropriate, oriented x3 Neuro-no new focal deficits, no tremors    Data Review   CBC w Diff:  Lab Results  Component Value Date   WBC 14.7 (H) 06/07/2022   HGB 15.6 06/07/2022   HCT 46.1 06/07/2022   PLT 202 06/07/2022   LYMPHOPCT 33 05/13/2018   MONOPCT 11 05/13/2018   EOSPCT 2 05/13/2018   BASOPCT 0 05/13/2018    CMP:  Lab Results  Component Value Date   NA 139 06/07/2022   K 3.3 (L) 06/07/2022   CL 106 06/07/2022   CO2 26 06/07/2022   BUN 17 06/07/2022   CREATININE 1.40 (H) 06/07/2022   CREATININE 1.33 02/13/2013   PROT 7.4 06/05/2022   ALBUMIN 3.8 06/05/2022   BILITOT 0.8 06/05/2022   ALKPHOS 122 06/05/2022   AST 24 06/05/2022   ALT 26 06/05/2022  .  Total Discharge time is about 33 minutes  [place] M.D  on 06/07/2022 at 3:52 PM  Go to [email or web address] -  for contact info  [facility] - Office  [phone]

## 2022-06-07 NOTE — Interval H&P Note (Signed)
History and Physical Interval Note:  06/07/2022 11:12 AM  Benjamin [name]  has presented today for surgery, with the diagnosis of melena, presyncope.  The various methods of treatment have been discussed with the patient and family. After consideration of risks, benefits and other options for treatment, the patient has consented to  Procedure(s): ESOPHAGOGASTRODUODENOSCOPY (EGD) WITH PROPOFOL (N/A) as a surgical intervention.  The patient's history has been reviewed, patient examined, no change in status, stable for surgery.  I have reviewed the patient's chart and labs.  Questions were answered to the patient's satisfaction.     Eloise Harman

## 2022-06-07 NOTE — Telephone Encounter (Signed)
Patient needs hospital follow up in next 3-4 weeks with [facility] or Dr. [name].

## 2022-06-07 NOTE — Op Note (Signed)
[facility] Patient Name: Benjamin [name] Procedure Date: 06/07/2022 12:48 PM MRN: [id number] Date of Birth: [birth year] Attending MD: Elon Alas. Abbey Chatters DO CSN: [id number] Age: 71 Admit Type: Inpatient Procedure:                Upper GI endoscopy Indications:              Dysphagia, Melena Providers:                Elon Alas. Abbey Chatters, DO, Charlsie Quest. Theda Sers RN, RN,                            Lambert Mody Referring MD:              Medicines:                See the Anesthesia note for documentation of the                            administered medications Complications:            No immediate complications. Estimated Blood Loss:     Estimated blood loss was minimal. Procedure:                Pre-Anesthesia Assessment:                           - The anesthesia plan was to use monitored                            anesthesia care (MAC).                           After obtaining informed consent, the endoscope was                            passed under direct vision. Throughout the                            procedure, the patient's blood pressure, pulse, and                            oxygen saturations were monitored continuously. The                            GIF-H190 ([id number]) scope was introduced through the                            mouth, and advanced to the second part of duodenum.                            The upper GI endoscopy was accomplished without                            difficulty. The patient tolerated the procedure                            well. Scope In: 12:54:40 PM  Scope Out: 1:04:58 PM Total Procedure Duration: 0 hours 10 minutes 18 seconds  Findings:      The Z-line was regular and was found 42 cm from the incisors.      No endoscopic abnormality was evident in the esophagus to explain the       patient's complaint of dysphagia. Preparations were made for empiric       dilation. A TTS dilator was passed through the scope. Dilation with an        18-19-20 mm balloon dilator was performed to 20 mm. Dilation was       performed with a mild resistance at 20 mm DUE TO POSSIBLE PROXIMAL       ESOPHAGEAL WEB. Estimated blood loss was none.      Patchy mild inflammation characterized by erythema was found in the       gastric body. Biopsies were taken with a cold forceps for Helicobacter       pylori testing.      A single 4 mm sessile polyp with no bleeding was found in the third       portion of the duodenum. The polyp was removed with a cold snare.       Resection and retrieval were complete. Impression:               - Z-line regular, 42 cm from the incisors.                           - Gastritis. Biopsied.                           - A single duodenal polyp. Resected and retrieved. Moderate Sedation:      Per Anesthesia Care Recommendation:           - Return patient to hospital ward for ongoing care.                           - Resume regular diet.                           - Okay to DC from GI standpoint. Resume Eliquis                            tomorrow.                           - We will arrange GI follow up. Procedure Code(s):        --- Professional ---                           [phone], Esophagogastroduodenoscopy, flexible,                            transoral; with removal of tumor(s), polyp(s), or                            other lesion(s) by snare technique                           43239, 59, Esophagogastroduodenoscopy, flexible,  transoral; with biopsy, single or multiple Diagnosis Code(s):        --- Professional ---                           K29.70, Gastritis, unspecified, without bleeding                           K31.7, Polyp of stomach and duodenum                           R13.10, Dysphagia, unspecified                           K92.1, Melena (includes Hematochezia) CPT copyright 2019 American Medical Association. All rights reserved. The codes documented in this report are preliminary  and upon coder review may  be revised to meet current compliance requirements. Elon Alas. Abbey Chatters, DO [place] Abbey Chatters, DO 06/07/2022 1:07:17 PM This report has been signed electronically. Number of Addenda: 0

## 2022-06-07 NOTE — Progress Notes (Signed)
Patient's BP has continued to remain elevated; around 170/100 throughout the night; no symptoms; administered 10 mg of hydralazine, BP remains unchanged. Will continue to monitor.

## 2022-06-07 NOTE — Transfer of Care (Signed)
Immediate Anesthesia Transfer of Care Note  Patient: Benjamin [name]  Procedure(s) Performed: ESOPHAGOGASTRODUODENOSCOPY (EGD) WITH PROPOFOL POLYPECTOMY Balloon dilation wire-guided  Patient Location: PACU  Anesthesia Type:General  Level of Consciousness: awake  Airway & Oxygen Therapy: Patient Spontanous Breathing  Post-op Assessment: Report given to RN and Post -op Vital signs reviewed and stable  Post vital signs: Reviewed and stable  Last Vitals:  Vitals Value Taken Time  BP 125/93   Temp 36   Pulse 66   Resp 16   SpO2 96     Last Pain:  Vitals:   06/07/22 1252  TempSrc:   PainSc: 0-No pain         Complications: No notable events documented.

## 2022-06-07 NOTE — Progress Notes (Signed)
Pt has discharge orders, discharge teaching given and no further questions at this time, discharge instructions given to pt and wife who was bedside. Pt wheeled down to main lobby by staff to vehicle accompanied by wife.

## 2022-06-07 NOTE — Discharge Instructions (Addendum)
1)Please hold Eliquis/apixaban until Friday, 06/08/2022 2) please follow-up with Dr. Domenic Polite for adjustment of your blood pressure medications----for now please take your blood pressures medications as you were previously instructed by Dr. Domenic Polite during your July visit earlier this year 3)Avoid ibuprofen/Advil/Aleve/Motrin/Goody Powders/Naproxen/BC powders/Meloxicam/Diclofenac/Indomethacin and other Nonsteroidal anti-inflammatory medications as these will make you more likely to bleed and can cause stomach ulcers, can also cause Kidney problems.  4)Please check your blood pressure once to twice every day and keep a diary of your blood pressure readings and take this with you to your visit with Dr. Domenic Polite 5)Repeat CBC and BMP blood test advised in about a week from now 6) please call gastroenterologist Dr. Hurshel Keys with [facility]--- to get results of your biopsies and follow-up information -address: [address], [place], [place] [address], Phone: [phone] 7)Chronic leukocytosis--elevated white blood count dating back to 2018... , Patient is strongly advised to follow-up with oncologist postdischarge for further work-up -No evidence of active infectious process at this time

## 2022-06-08 ENCOUNTER — Telehealth: Payer: Self-pay | Admitting: Cardiology

## 2022-06-08 LAB — SURGICAL PATHOLOGY

## 2022-06-08 NOTE — Telephone Encounter (Signed)
Satira Sark, MD     04/13/22  2:03 PM  I just saw him in clinic after a three year hiatus. He has been managed by PCP and through the [place]. We did reduce his Lopressor and he was already taken off other antihypertensives. It sounds like he is still having orthostatic dizziness. Reasonable to ask about Flomax as this could be related. We could start low dose midodrine 2.5 mg BID to see if that helps blunt blood pressure drop as well.     06/08/22 I spoke with wife. She states that Benjamin [name] had several dizzy spells yesterday but did not actually have syncope. He wen to bed at 5:30 pm yesterday and slept until 8:30 am today.  His lying BP was 145/103, HR 81, when he stood up he felt dizzy and wife repeated and it was 100/84, HR 95     I will forward to DOD, Dr.Ross

## 2022-06-08 NOTE — Telephone Encounter (Signed)
  Pt c/o Syncope: STAT if syncope occurred within 30 minutes and pt complains of lightheadedness High Priority if episode of passing out, completely, today or in last 24 hours   Did you pass out today? No   When is the last time you passed out? yesterday   Has this occurred multiple times? No   Did you have any symptoms prior to passing out? Not sure  [name] said, when they got home from hospital yesterday, as soon as they arrived at their house and pt is getting out of the car, pt felt dizzy and passed out. She also said, pt needs to f/u in a week with Dr. Domenic Polite

## 2022-06-08 NOTE — Anesthesia Postprocedure Evaluation (Signed)
Anesthesia Post Note  Patient: UNDRAY ALLMAN  Procedure(s) Performed: ESOPHAGOGASTRODUODENOSCOPY (EGD) WITH PROPOFOL POLYPECTOMY Balloon dilation wire-guided  Patient location during evaluation: Phase II Anesthesia Type: General Level of consciousness: awake Pain management: pain level controlled Vital Signs Assessment: post-procedure vital signs reviewed and stable Respiratory status: spontaneous breathing and respiratory function stable Cardiovascular status: blood pressure returned to baseline and stable Postop Assessment: no headache and no apparent nausea or vomiting Anesthetic complications: no Comments: Late entry   No notable events documented.   Last Vitals:  Vitals:   06/07/22 1315 06/07/22 1333  BP: 115/82 (!) 153/96  Pulse: 65 (!) 57  Resp: 15 18  Temp:  36.6 C  SpO2: 96% 97%    Last Pain:  Vitals:   06/07/22 1333  TempSrc: Oral  PainSc:                  Louann Sjogren

## 2022-06-11 ENCOUNTER — Encounter: Payer: Self-pay | Admitting: Cardiology

## 2022-06-11 ENCOUNTER — Ambulatory Visit: Payer: No Typology Code available for payment source | Attending: Cardiology | Admitting: Cardiology

## 2022-06-11 VITALS — BP 122/82 | HR 60 | Ht 74.0 in | Wt 251.0 lb

## 2022-06-11 DIAGNOSIS — I951 Orthostatic hypotension: Secondary | ICD-10-CM

## 2022-06-11 DIAGNOSIS — I4821 Permanent atrial fibrillation: Secondary | ICD-10-CM | POA: Diagnosis not present

## 2022-06-11 MED ORDER — MIDODRINE HCL 2.5 MG PO TABS: 2.5000 mg | ORAL_TABLET | Freq: Two times a day (BID) | ORAL | 3 refills | Status: DC

## 2022-06-11 MED ORDER — METOPROLOL TARTRATE 25 MG PO TABS: 25.0000 mg | ORAL_TABLET | Freq: Two times a day (BID) | ORAL | 3 refills | Status: DC

## 2022-06-11 NOTE — Patient Instructions (Signed)
Medication Instructions:   DECREASE Lopressor to 25 mg twice a day  START Proamatine 2.5 mg twice a day  Labwork: None today  Testing/Procedures: None today  Follow-Up: 6 weeks  Any Other Special Instructions Will Be Listed Below (If Applicable).  If you need a refill on your cardiac medications before your next appointment, please call your pharmacy.

## 2022-06-11 NOTE — Progress Notes (Signed)
Cardiology Office Note  Date: 06/11/2022   ID: Benjamin [name], Benjamin [name] [birth year], MRN [id number]  PCP:  [facility]  Cardiologist:  Rozann Lesches, MD Electrophysiologist:  None   Chief Complaint  Patient presents with   Cardiac follow-up    History of Present Illness: Benjamin [name] is a 71 y.o. male last seen in July.  He presents for a follow-up visit reporting orthostatic dizziness.  He is here today with his wife.  At this point he is no longer on prior antihypertensive regimen other than Lopressor which is mainly being used for heart rate control of atrial fibrillation.  We had already cut the dose back at his visit in July.  He was just recently discharged from the hospital for management of GI bleed.  He presented with melena, did not require PRBC transfusion.  EGD was performed demonstrating gastritis with single duodenal polyp which was resected.  Eliquis was held temporarily, he has had no further melena since discharge.  Orthostatics were obtained today, positive by blood pressure but not heart rate.  Systolic low was 92 while standing down from 122 supine.  We discussed further down titration of Lopressor to 25 mg twice daily and initiation of ProAmatine beginning at 2.5 mg twice daily.  Suspect he has autonomic dysfunction possibly in association with type 2 diabetes mellitus.  Past Medical History:  Diagnosis Date   Arthritis    Essential hypertension    Gout    History of stroke    Right occipital May 2018   Hypertensive heart disease    LVH with grade 2 diastolic dysfunction   Mixed hyperlipidemia    Statin intolerance   Rotator cuff tear    Chronic pain   Sleep apnea    a. intolerant to CPAP   Stroke ([place]) 10/1998   TIA   Tubular adenoma    Type 2 diabetes mellitus ([place])     Past Surgical History:  Procedure Laterality Date   APPENDECTOMY     COLONOSCOPY N/A 10/29/2013   Dr. [name]- sigmoid polyp status post cold snare removal. large sessile  ascending colon polyp. debulked with saline assisted snare polypectomy . pancolonic diverticulosis. inadequate prep. tubular adenoma on bx   COLONOSCOPY N/A 02/22/2014   Dr. [name]: Saline-assisted snare polypectomy/biopsy for sprawling carpet polyp in the ascending colon which could not be completely removed. Status post biopsy (tubular adenoma), tattooed   COLONOSCOPY N/A 03/01/2014   LKT:GYBW polypectomy hemorrhage s/p bleeding   COLONOSCOPY N/A 06/08/2015   Procedure: COLONOSCOPY;  Surgeon: [name], MD;  [facility];  Service: Endoscopy;  Laterality: N/A;  0900   COLONOSCOPY WITH PROPOFOL N/A 11/14/2020   Procedure: COLONOSCOPY WITH PROPOFOL;  Surgeon: [name], MD;  [facility];  Service: Endoscopy;  Laterality: N/A;  8:15am   ESOPHAGOGASTRODUODENOSCOPY N/A 02/22/2014   Dr. [name]: Erosive reflux esophagitis. Schatzki ring status post dilation. Gastric and duodenal erosions with benign biopsies.   LAPAROSCOPIC RIGHT COLECTOMY N/A 04/16/2014   Procedure: LAPAROSCOPIC ASSISTED RIGHT COLECTOMY;  Surgeon: [name], MD;  Location: [facility];  Service: General;  Laterality: N/A;   MALONEY DILATION N/A 02/22/2014   Procedure: [name];  Surgeon: [name], MD;  [facility];  Service: Endoscopy;  Laterality: N/A;   SAVORY DILATION N/A 02/22/2014   Procedure: SAVORY DILATION;  Surgeon: [name], MD;  [facility];  Service: Endoscopy;  Laterality: N/A;    Current Outpatient  Medications  Medication Sig Dispense Refill   acetaminophen (TYLENOL) 500 MG tablet Take 1,000 mg by mouth every 6 (six) hours as needed.     allopurinol (ZYLOPRIM) 100 MG tablet Take 100 mg by mouth daily.     apixaban (ELIQUIS) 5 MG TABS tablet Take 1 tablet (5 mg total) by mouth 2 (two) times daily. 60 tablet 1   cholecalciferol (VITAMIN D3) 25 MCG (1000 UNIT) tablet Take 1,000 Units by mouth daily.     cyclobenzaprine (FLEXERIL) 10 MG tablet Take 10 mg  by mouth at bedtime.     DULoxetine (CYMBALTA) 30 MG capsule Take 30 mg by mouth daily.     empagliflozin (JARDIANCE) 25 MG TABS tablet Take 12.5 mg by mouth daily. Half tablet daily     ezetimibe (ZETIA) 10 MG tablet TAKE ONE TABLET BY MOUTH DAILY FOR CHOLESTEROL     fexofenadine (ALLEGRA) 60 MG tablet Take 60 mg by mouth daily.     guaifenesin (HUMIBID E) 400 MG TABS tablet TAKE ONE TABLET BY MOUTH TWICE A DAY AS NEEDED FOR CONGESTION     insulin glargine (LANTUS) 100 UNIT/ML injection Inject 40 Units into the skin daily. IN THE MORNING     metoprolol tartrate (LOPRESSOR) 25 MG tablet Take 1 tablet (25 mg total) by mouth 2 (two) times daily. 180 tablet 3   midodrine (PROAMATINE) 2.5 MG tablet Take 1 tablet (2.5 mg total) by mouth 2 (two) times daily with a meal. 180 tablet 3   OVER THE COUNTER MEDICATION Pain relieving cream     pantoprazole (PROTONIX) 40 MG tablet Take 1 tablet (40 mg total) by mouth in the morning and at bedtime. 60 tablet 4   Semaglutide, 1 MG/DOSE, 2 MG/1.5ML SOPN Inject 1 mg into the skin every Wednesday.      sodium chloride (OCEAN) 0.65 % SOLN nasal spray Place 1 spray into both nostrils as needed for congestion.     Tafluprost, PF, 0.0015 % SOLN Place 1 drop into both eyes every evening.     Timolol Maleate PF 0.5 % SOLN Place 1 drop into both eyes in the morning and at bedtime.     No current facility-administered medications for this visit.   Allergies:  Niacin-lovastatin er, Statins, Tamsulosin, and Zocor [simvastatin - high dose]   ROS: No chest pain, no orthopnea or PND.  Physical Exam: VS:  BP 122/82   Pulse 60   Ht '6\' 2"'$  (1.88 m)   Wt 251 lb (113.9 kg)   SpO2 97%   BMI 32.23 kg/m , BMI Body mass index is 32.23 kg/m.  Wt Readings from Last 3 Encounters:  06/11/22 251 lb (113.9 kg)  06/07/22 246 lb 4.1 oz (111.7 kg)  06/05/22 251 lb 6.4 oz (114 kg)    General: Patient appears comfortable at rest. HEENT: Conjunctiva and lids normal. Neck: Supple,  no elevated JVP or carotid bruits, no thyromegaly. Lungs: Clear to auscultation, nonlabored breathing at rest. Cardiac: Irregularly irregular, no S3, 1/6 systolic murmur, no pericardial rub. Abdomen: Soft, nontender, bowel sounds present. Extremities: No pitting edema.  ECG:  An ECG dated 04/11/2022 was personally reviewed today and demonstrated:  Atrial fibrillation with right bundle branch block.  Recent Labwork: 06/05/2022: ALT 26; AST 24; Magnesium 2.1; TSH 0.927 06/07/2022: BUN 17; Creatinine, Ser 1.40; Hemoglobin 15.6; Platelets 202; Potassium 3.3; Sodium 139     Component Value Date/Time   CHOL 180 01/22/2017 0506   TRIG 391 (H) 01/22/2017 0506   HDL 25 (  L) 01/22/2017 0506   CHOLHDL 7.2 01/22/2017 0506   VLDL 78 (H) 01/22/2017 0506   LDLCALC 77 01/22/2017 0506    Other Studies Reviewed Today:  Echocardiogram 04/12/2022:  1. Left ventricular ejection fraction, by estimation, is 60 to 65%. The  left ventricle has normal function. The left ventricle demonstrates  regional wall motion abnormalities (see scoring diagram/findings for  description). There is mild left ventricular   hypertrophy. Left ventricular diastolic parameters are indeterminate.   2. Right ventricular systolic function is normal. The right ventricular  size is normal. Tricuspid regurgitation signal is inadequate for assessing  PA pressure.   3. Left atrial size was mildly dilated.   4. The mitral valve is abnormal. Mild mitral valve regurgitation. No  evidence of mitral stenosis.   5. The aortic valve has an indeterminant number of cusps. There is  moderate calcification of the aortic valve. There is moderate thickening  of the aortic valve. Aortic valve regurgitation is not visualized. No  aortic stenosis is present.   6. There is mild dilatation of the ascending aorta, measuring 41 mm.   7. The inferior vena cava is normal in size with greater than 50%  respiratory variability, suggesting right atrial  pressure of 3 mmHg.   Assessment and Plan:  1.  Orthostatic hypotension, symptomatic intermittently.  Suspect autonomic dysfunction most likely.  Plan is to down titrate Lopressor to 25 mg twice daily particularly in light of adequate heart rate control in atrial fibrillation.  Also add ProAmatine beginning at 2.5 mg twice daily.  Continue to track blood pressure.  We will see him back for follow-up.  2.  Persistent/permanent atrial fibrillation with CHA2DS2-VASc score of 6.  He continues on Eliquis for stroke prophylaxis.  Reducing metoprolol to 25 mg twice daily for now.  Medication Adjustments/Labs and Tests Ordered: Current medicines are reviewed at length with the patient today.  Concerns regarding medicines are outlined above.   Tests Ordered: No orders of the defined types were placed in this encounter.   Medication Changes: Meds ordered this encounter  Medications   metoprolol tartrate (LOPRESSOR) 25 MG tablet    Sig: Take 1 tablet (25 mg total) by mouth 2 (two) times daily.    Dispense:  180 tablet    Refill:  3    06/11/22 dose decreased to 25 mg bid   midodrine (PROAMATINE) 2.5 MG tablet    Sig: Take 1 tablet (2.5 mg total) by mouth 2 (two) times daily with a meal.    Dispense:  180 tablet    Refill:  3    Disposition:  Follow up  4 to 6 weeks.  Signed, Satira Sark, MD, [facility] 06/11/2022 2:52 PM    [place] at [facility] [address], [place], [place] [address] Phone: [phone]; Fax: [phone]

## 2022-06-11 NOTE — Telephone Encounter (Signed)
I spoke with wife and they agree to come today at 2:30 pm to see Dr.McDowell  Wife reports patient had sitting BP this am of  125/82, walked into another room and BP was 84/58. His biggest complaint is fatigue.  Will do orthostatics on him today.

## 2022-06-13 ENCOUNTER — Telehealth: Payer: Self-pay | Admitting: Cardiology

## 2022-06-13 NOTE — Telephone Encounter (Signed)
Pt c/o medication issue:  1. Name of Medication:   midodrine (PROAMATINE) 2.5 MG tablet (not taking yet) metoprolol tartrate (LOPRESSOR) 25 MG tablet (As prescribed)  2. How are you currently taking this medication (dosage and times per day)?   3. Are you having a reaction (difficulty breathing--STAT)?   N/A  4. What is your medication issue?   Wife stated the [place] had not received these prescriptions.  Wife stated the prescriptions should be faxed to the [facility], fax# [phone].  Wife would like [place] to return call.

## 2022-06-13 NOTE — Telephone Encounter (Signed)
I confirmed that the [place] received both prescriptions on 04/10/22 ay 704 pm  Wife is aware, they have apt at the [place] and will stop by pharmacy

## 2022-06-14 ENCOUNTER — Encounter (HOSPITAL_COMMUNITY): Payer: Self-pay | Admitting: Internal Medicine

## 2022-06-18 ENCOUNTER — Telehealth: Payer: Self-pay | Admitting: Cardiology

## 2022-06-18 NOTE — Telephone Encounter (Signed)
Spoke to pt's wife who stated that since patient had started Midodrine 2.5 mg tablets BID, pt has stated he did not "feel right". Wife stated that Saturday night pt had an episode where pt was not making any sense while talking. Wife finally convinced pt to lay down and stated that pt's skin was cold and clammy. Wife checked pt bp: 106/60- pt denied sob,cp,headache. Wife stated that pt had laid down for 3 hours and when he got back up, he felt better. Pt did not take Midodrine Sunday. Wife checked pt's bp this morning: 111/72 hr: 62.  Please advise.

## 2022-06-18 NOTE — Telephone Encounter (Signed)
Patients wife notified

## 2022-06-18 NOTE — Telephone Encounter (Signed)
Patient's wife called about new medication that the patient went on Thursday and its important.

## 2022-06-25 ENCOUNTER — Telehealth: Payer: Self-pay | Admitting: Cardiology

## 2022-06-25 NOTE — Telephone Encounter (Signed)
Pt c/o Syncope: STAT if syncope occurred within 30 minutes and pt complains of lightheadedness High Priority if episode of passing out, completely, today or in last 24 hours   Did you pass out today? No    When is the last time you passed out? Saturday morning 10/07      Has this occurred multiple times?  Yes in the past   Did you have any symptoms prior to passing out? [name] is requesting to speak to a Cassie and states they know what is going on with patient and that this has been an ongoing issue. Requesting call back.

## 2022-06-25 NOTE — Telephone Encounter (Signed)
Left message to return call. I left message that I would call at the end of clinic.

## 2022-06-25 NOTE — Telephone Encounter (Signed)
Pt stated that last week he passed out twice. Pt also stated that he passed out after urinating Saturday night 06/23/22. Pt admitted that he has stopped taking Midodrine 5 days ago and he does not want to take this medication. Pt stated that bp runs normal at home but could not provide any bp's at the time.   Please advise.

## 2022-06-26 MED ORDER — FLUDROCORTISONE ACETATE 0.1 MG PO TABS: 0.1000 mg | ORAL_TABLET | Freq: Every day | ORAL | 3 refills | Status: DC

## 2022-06-26 NOTE — Telephone Encounter (Signed)
I spoke with both patient and wife.They will stop midodrine and try Florinef 0.1 mg daily.  They have an appointment with endocrinology at the [facility] this Friday.

## 2022-06-28 ENCOUNTER — Ambulatory Visit: Payer: No Typology Code available for payment source | Admitting: Internal Medicine

## 2022-06-28 ENCOUNTER — Ambulatory Visit (INDEPENDENT_AMBULATORY_CARE_PROVIDER_SITE_OTHER): Payer: No Typology Code available for payment source | Admitting: "Endocrinology

## 2022-06-28 ENCOUNTER — Encounter: Payer: Self-pay | Admitting: "Endocrinology

## 2022-06-28 VITALS — Ht 74.0 in | Wt 253.6 lb

## 2022-06-28 DIAGNOSIS — E1122 Type 2 diabetes mellitus with diabetic chronic kidney disease: Secondary | ICD-10-CM | POA: Diagnosis not present

## 2022-06-28 DIAGNOSIS — R232 Flushing: Secondary | ICD-10-CM | POA: Diagnosis not present

## 2022-06-28 DIAGNOSIS — I509 Heart failure, unspecified: Secondary | ICD-10-CM | POA: Diagnosis not present

## 2022-06-28 DIAGNOSIS — I130 Hypertensive heart and chronic kidney disease with heart failure and stage 1 through stage 4 chronic kidney disease, or unspecified chronic kidney disease: Secondary | ICD-10-CM | POA: Diagnosis not present

## 2022-06-28 NOTE — Progress Notes (Signed)
Endocrinology Consult Note                                            06/28/2022, 10:01 AM   Subjective:    Patient ID: Benjamin [name], male    DOB: 1951/08/07, PCP [facility], [facility]   Past Medical History:  Diagnosis Date   Arthritis    Essential hypertension    Gout    History of stroke    Right occipital May 2018   Hypertensive heart disease    LVH with grade 2 diastolic dysfunction   Mixed hyperlipidemia    Statin intolerance   Rotator cuff tear    Chronic pain   Sleep apnea    a. intolerant to CPAP   Stroke ([place]) 10/1998   TIA   Tubular adenoma    Type 2 diabetes mellitus ([place])    Past Surgical History:  Procedure Laterality Date   APPENDECTOMY     COLONOSCOPY N/A 10/29/2013   Dr. [name]- sigmoid polyp status post cold snare removal. large sessile ascending colon polyp. debulked with saline assisted snare polypectomy . pancolonic diverticulosis. inadequate prep. tubular adenoma on bx   COLONOSCOPY N/A 02/22/2014   Dr. [name]: Saline-assisted snare polypectomy/biopsy for sprawling carpet polyp in the ascending colon which could not be completely removed. Status post biopsy (tubular adenoma), tattooed   COLONOSCOPY N/A 03/01/2014   OVZ:CHYI polypectomy hemorrhage s/p bleeding   COLONOSCOPY N/A 06/08/2015   Procedure: COLONOSCOPY;  Surgeon: [name], MD;  [facility];  Service: Endoscopy;  Laterality: N/A;  0900   COLONOSCOPY WITH PROPOFOL N/A 11/14/2020   Procedure: COLONOSCOPY WITH PROPOFOL;  Surgeon: [name], MD;  [facility];  Service: Endoscopy;  Laterality: N/A;  8:15am   ESOPHAGOGASTRODUODENOSCOPY N/A 02/22/2014   Dr. [name]: Erosive reflux esophagitis. Schatzki ring status post dilation. Gastric and duodenal erosions with benign biopsies.   ESOPHAGOGASTRODUODENOSCOPY (EGD) WITH PROPOFOL N/A 06/07/2022   Procedure: ESOPHAGOGASTRODUODENOSCOPY (EGD) WITH PROPOFOL;  Surgeon: Eloise Harman, DO;  [facility];   Service: Endoscopy;  Laterality: N/A;   LAPAROSCOPIC RIGHT COLECTOMY N/A 04/16/2014   Procedure: LAPAROSCOPIC ASSISTED RIGHT COLECTOMY;  Surgeon: [name], MD;  Location: [place];  Service: General;  Laterality: N/A;   MALONEY DILATION N/A 02/22/2014   Procedure: [name];  Surgeon: [name], MD;  [facility];  Service: Endoscopy;  Laterality: N/A;   POLYPECTOMY  06/07/2022   Procedure: POLYPECTOMY;  Surgeon: Eloise Harman, DO;  [facility];  Service: Endoscopy;;   SAVORY DILATION N/A 02/22/2014   Procedure: SAVORY DILATION;  Surgeon: [name], MD;  [facility];  Service: Endoscopy;  Laterality: N/A;   Social History   Socioeconomic History   Marital status: Married    Spouse name: Not on file   Number of children: 2   Years of education: Not on file   Highest education level: Not on file  Occupational History   Occupation: Clinical biochemist    Employer: [employer]   Tobacco Use   Smoking status: Former    Types: Cigarettes    Start date: 09/17/1961    Quit date: 09/18/1975    Years since quitting: 46.8   Smokeless tobacco: Never   Tobacco comments:    quit 35 years ago  89  Use   Vaping Use: Never used  Substance and Sexual Activity   Alcohol use: No    Alcohol/week: 0.0 standard drinks of alcohol    Comment: None in over a year. prior to that 1-2 beers couple times per week.   Drug use: No   Sexual activity: Yes  Other Topics Concern   Not on file  Social History Narrative   Not on file   Social Determinants of Health   Financial Resource Strain: Not on file  Food Insecurity: No Food Insecurity (06/05/2022)   Hunger Vital Sign    Worried About Running Out of Food in the Last Year: Never true    Ran Out of Food in the Last Year: Never true  Transportation Needs: No Transportation Needs (06/05/2022)   PRAPARE - Hydrologist (Medical): No    Lack of Transportation (Non-Medical): No   Physical Activity: Not on file  Stress: Not on file  Social Connections: Not on file   Family History  Problem Relation Age of Onset   Hypertension Mother    Diabetes type II Mother    Colon cancer Neg Hx    Outpatient Encounter Medications as of 06/28/2022  Medication Sig   acetaminophen (TYLENOL) 500 MG tablet Take 1,000 mg by mouth every 6 (six) hours as needed.   allopurinol (ZYLOPRIM) 100 MG tablet Take 100 mg by mouth daily.   apixaban (ELIQUIS) 5 MG TABS tablet Take 1 tablet (5 mg total) by mouth 2 (two) times daily.   cholecalciferol (VITAMIN D3) 25 MCG (1000 UNIT) tablet Take 1,000 Units by mouth daily.   cyclobenzaprine (FLEXERIL) 10 MG tablet Take 10 mg by mouth at bedtime.   DULoxetine (CYMBALTA) 30 MG capsule Take 30 mg by mouth daily.   empagliflozin (JARDIANCE) 25 MG TABS tablet Take 12.5 mg by mouth daily. Half tablet daily   ezetimibe (ZETIA) 10 MG tablet TAKE ONE TABLET BY MOUTH DAILY FOR CHOLESTEROL   fexofenadine (ALLEGRA) 60 MG tablet Take 60 mg by mouth daily.   fludrocortisone (FLORINEF) 0.1 MG tablet Take 1 tablet (0.1 mg total) by mouth daily.   guaifenesin (HUMIBID E) 400 MG TABS tablet TAKE ONE TABLET BY MOUTH TWICE A DAY AS NEEDED FOR CONGESTION   insulin glargine (LANTUS) 100 UNIT/ML injection Inject 40 Units into the skin daily. IN THE MORNING   metoprolol tartrate (LOPRESSOR) 25 MG tablet Take 1 tablet (25 mg total) by mouth 2 (two) times daily.   OVER THE COUNTER MEDICATION Pain relieving cream   pantoprazole (PROTONIX) 40 MG tablet Take 1 tablet (40 mg total) by mouth in the morning and at bedtime.   Semaglutide, 1 MG/DOSE, 2 MG/1.5ML SOPN Inject 1 mg into the skin every Wednesday.    sodium chloride (OCEAN) 0.65 % SOLN nasal spray Place 1 spray into both nostrils as needed for congestion.   Tafluprost, PF, 0.0015 % SOLN Place 1 drop into both eyes every evening.   Timolol Maleate PF 0.5 % SOLN Place 1 drop into both eyes in the morning and at bedtime.    No facility-administered encounter medications on file as of 06/28/2022.   ALLERGIES: Allergies  Allergen Reactions   Niacin-Lovastatin Er Other (See Comments)    ADVICOR= Body aches   Statins     Body aches   Tamsulosin     Other reaction(s): Low blood pressure   Zocor [Simvastatin - High Dose] Other (See Comments)    Body aches.    VACCINATION  STATUS: Immunization History  Administered Date(s) Administered   Influenza-Unspecified 07/18/2018    HPI SACRAMENTO MONDS is 71 y.o. male who presents today with a medical history as above. he is being seen in consultation for flushing/sweating requested by [facility], [facility].  History is obtained directly from the patient as well as chart review.  He is accompanied by his wife to clinic. He is a not an optimal historian.  He started to have excessive sweating from the beginning of the year.  No particular association with food.  Denies fever/cough. He has multiple medical problems on polypharmacy including type 2 diabetes, hypertension, hyperlipidemia, depression CKD, CVA, heart failure.  He is on Cymbalta which was started 5 to 6 months ago. His other medications include Lantus, Jardiance, Ozempic, Lopressor. He was also dealing with dizziness and fainting.  He was started on Florinef 0.1 mg p.o. daily to help with this. He denies any prior knowledge of adrenal, pituitary, thyroid dysfunction.  His recent TSH was normal as well as A1c of 6.8%. He denies any paroxysms of headaches associated with any sweating. He does not have recent abdominal imaging to review.  Review of Systems  Constitutional: + Minimally fluctuating body weight,  + fatigue, + subjective hyperthermia.  Eyes: no blurry vision, no xerophthalmia ENT: no sore throat, no nodules palpated in throat, no dysphagia/odynophagia, no hoarseness Cardiovascular: no Chest Pain, no Shortness of Breath, no palpitations, no leg swelling Respiratory: no cough, no shortness of  breath Gastrointestinal: no Nausea/Vomiting/Diarhhea Musculoskeletal: no muscle/joint aches Skin: no rashes Neurological: no tremors, no numbness, no tingling, no dizziness Psychiatric: no depression, no anxiety  Objective:       06/28/2022    8:55 AM 06/11/2022    2:13 PM 06/07/2022    1:33 PM  Vitals with BMI  Height '6\' 2"'$  '6\' 2"'$    Weight 253 lbs 10 oz 251 lbs   BMI 46.28 63.81   Systolic  771 165  Diastolic  82 96  Pulse  60 57    Ht '6\' 2"'$  (1.88 m)   Wt 253 lb 9.6 oz (115 kg)   BMI 32.56 kg/m   Wt Readings from Last 3 Encounters:  06/28/22 253 lb 9.6 oz (115 kg)  06/11/22 251 lb (113.9 kg)  06/07/22 246 lb 4.1 oz (111.7 kg)    Physical Exam  Constitutional:  Body mass index is 32.56 kg/m.,  not in acute distress, normal state of mind Eyes: PERRLA, EOMI, no exophthalmos ENT: moist mucous membranes, no gross thyromegaly, no gross cervical lymphadenopathy Cardiovascular: normal precordial activity, Regular Rate and Rhythm, no Murmur/Rubs/Gallops Respiratory:  adequate breathing efforts, no gross chest deformity, Clear to auscultation bilaterally Gastrointestinal: abdomen soft, Non -tender, No distension, Bowel Sounds present, no gross organomegaly Musculoskeletal: no gross deformities, strength intact in all four extremities Skin: moist, warm, no rashes Neurological: no tremor with outstretched hands, Deep tendon reflexes normal in bilateral lower extremities.  CMP ( most recent) CMP     Component Value Date/Time   NA 139 06/07/2022 0413   K 3.3 (L) 06/07/2022 0413   CL 106 06/07/2022 0413   CO2 26 06/07/2022 0413   GLUCOSE 141 (H) 06/07/2022 0413   BUN 17 06/07/2022 0413   CREATININE 1.40 (H) 06/07/2022 0413   CREATININE 1.33 02/13/2013 1535   CALCIUM 8.6 (L) 06/07/2022 0413   PROT 7.4 06/05/2022 1210   ALBUMIN 3.8 06/05/2022 1210   AST 24 06/05/2022 1210   ALT 26 06/05/2022 1210   ALKPHOS 122  06/05/2022 1210   BILITOT 0.8 06/05/2022 1210   GFRNONAA 53  (L) 06/07/2022 0413   GFRAA 45 (L) 05/13/2018 1731     Diabetic Labs (most recent): Lab Results  Component Value Date   HGBA1C 6.8 (H) 06/05/2022   HGBA1C 9.7 (H) 01/22/2017   HGBA1C 7.0 (H) 04/12/2014     Lipid Panel ( most recent) Lipid Panel     Component Value Date/Time   CHOL 180 01/22/2017 0506   TRIG 391 (H) 01/22/2017 0506   HDL 25 (L) 01/22/2017 0506   CHOLHDL 7.2 01/22/2017 0506   VLDL 78 (H) 01/22/2017 0506   LDLCALC 77 01/22/2017 0506      Lab Results  Component Value Date   TSH 0.927 06/05/2022   TSH 0.799 12/20/2012   TSH 1.749 10/22/2011           Assessment & Plan:   1. Flushing  - ROBERTO ROMANOSKI  is being seen at a kind request of Clinic, [place]. - I have reviewed his available endocrine records and clinically evaluated the patient. His presenting symptom of flushing has potentially multiple etiologies. Hypokalemia etiology is unclear. His recent labs did not show hypothyroidism.  He denies hypoglycemia.  I reviewed his labs showing good control of glycemia currently on Lantus, Ozempic, and Jardiance. He would benefit from further work-up to rule out hypogonadism, pheochromocytoma, and adrenal insufficiency. -He will have labs for further: - Testosterone, Free, Total, SHBG - Cortisol-am, blood - 5 HIAA w/Creatinine, 24 hr. - Catecholamines, fractionated, urine, 24 hour; Future - Metanephrines, urine, 24 hour; Future  On June 05, 2022, he did have a suboptimal a.m. cortisol of 5.9.  He will also need work-up to rule out adrenal insufficiency: - ACTH stimulation, 3 time points   - I did not initiate any new prescriptions today.  In the meantime, he is advised to continue Florinef 0.1 mg p.o. daily. - he is advised to maintain close follow up with [facility] for primary care needs.   - Time spent with the patient: 50 minutes, of which >50% was spent in  counseling him about his nonspecific flushing and the rest in  obtaining information about his symptoms, reviewing his previous labs/studies ( including abstractions from other facilities),  evaluations, and treatments,  and developing a plan to confirm diagnosis and long term treatment based on the latest standards of care/guidelines; and documenting his care.  Benjamin [name] participated in the discussions, expressed understanding, and voiced agreement with the above plans.  All questions were answered to his satisfaction. he is encouraged to contact clinic should he have any questions or concerns prior to his return visit.  Follow up plan: Return in about 3 weeks (around 07/19/2022), or and 24 hour urine tests, for Fasting Labs  in AM B4 8.   Glade Lloyd, MD [facility] [address] [place], [place] [address] Phone: [phone]  Fax: [phone]     06/28/2022, 10:01 AM  This note was partially dictated with voice recognition software. Similar sounding words can be transcribed inadequately or may not  be corrected upon review.

## 2022-06-29 ENCOUNTER — Telehealth: Payer: Self-pay

## 2022-06-29 NOTE — Telephone Encounter (Signed)
Left a message requesting pt return call to the office. ?

## 2022-07-02 ENCOUNTER — Telehealth: Payer: Self-pay | Admitting: Cardiology

## 2022-07-02 NOTE — Telephone Encounter (Signed)
Spoke to pt's wife who stated pt had Carpal Tunnel surgery this morning. Pt's bp before surgery was 190/90. Surgeons office contacted pt's PCP for the go ahead to proceed with surgery. [name] stated that pts bp has been running: 10/12- 144/90 10/13-173/99 10/15-174/88 10/16-190/90  [name] stated pt is compliant with both metoprolol 25 mg tablets BID and florinef 0.1 mg tablets QD which he started on 10/10.   Please advise.

## 2022-07-02 NOTE — Telephone Encounter (Signed)
Spoke with pt to confirm he had received message regarding ACTH test.

## 2022-07-02 NOTE — Telephone Encounter (Signed)
Pt left a VM asking for a return call

## 2022-07-02 NOTE — Telephone Encounter (Signed)
Pt c/o BP issue: STAT if pt c/o blurred vision, one-sided weakness or slurred speech  1. What are your last 5 BP readings? 190/90  2. Are you having any other symptoms (ex. Dizziness, headache, blurred vision, passed out)? No   3. What is your BP issue? Patient had hand surgery this morning and she is now concerned about pt's bp. She is requesting call back.

## 2022-07-03 NOTE — Telephone Encounter (Signed)
Spoke to pt's wife who stated that pt took Florinef this morning and bp is 138/83. [name] would like to see if bp evens out before stopping medication. Pt will call office back on Friday with bp readings.

## 2022-07-05 ENCOUNTER — Ambulatory Visit: Payer: No Typology Code available for payment source | Admitting: Internal Medicine

## 2022-07-06 ENCOUNTER — Other Ambulatory Visit (HOSPITAL_COMMUNITY): Payer: Self-pay | Admitting: *Deleted

## 2022-07-06 ENCOUNTER — Telehealth: Payer: Self-pay | Admitting: Cardiology

## 2022-07-06 DIAGNOSIS — R232 Flushing: Secondary | ICD-10-CM

## 2022-07-06 NOTE — Telephone Encounter (Signed)
I spoke with wife and he hand surgery on Monday, 10/16 and he was noted to gave a BP of 168/98. She said the repeated BP and reading was "a little lower" but didn't know the reading. The nephrologist wanted him to go back on losartan. Wife says his BP the rest of this week has been 130's/80's and they do not want to go back on medication until Dr.McDowell reviews.

## 2022-07-06 NOTE — Telephone Encounter (Signed)
I relayed Dr.McDowell's message to wife.She will track BP's and update me regularly.

## 2022-07-06 NOTE — Telephone Encounter (Signed)
Pt c/o medication issue:  1. Name of Medication: Losartan  2. How are you currently taking this medication (dosage and times per day)? Not currently taking   3. Are you having a reaction (difficulty breathing--STAT)?   4. What is your medication issue? Patient's wife states she would like a call back from [place] to discuss this medication. She states that other doctors are wanting to put patient on this medication and she would like to discuss this with Dr. Myles Gip nurse.

## 2022-07-06 NOTE — Telephone Encounter (Signed)
Patient's spouse called to give Benjamin [name] BP readings:  Monday: 138/80 Tuesday:138/83 [place]:   127/84 Thurs.:    155/81 Fri.:         168/90

## 2022-07-10 ENCOUNTER — Ambulatory Visit: Payer: No Typology Code available for payment source | Admitting: Internal Medicine

## 2022-07-10 ENCOUNTER — Ambulatory Visit (HOSPITAL_COMMUNITY)
Admission: RE | Admit: 2022-07-10 | Discharge: 2022-07-10 | Disposition: A | Discharge status: Home / Self Care | Payer: No Typology Code available for payment source | Source: Ambulatory Visit | Attending: "Endocrinology | Admitting: "Endocrinology

## 2022-07-10 ENCOUNTER — Ambulatory Visit: Payer: No Typology Code available for payment source | Admitting: Cardiology

## 2022-07-10 DIAGNOSIS — R232 Flushing: Secondary | ICD-10-CM | POA: Insufficient documentation

## 2022-07-10 LAB — ACTH STIMULATION, 3 TIME POINTS
Cortisol, 30 Min: 28.5 ug/dL
Cortisol, 60 Min: 28.7 ug/dL
Cortisol, Base: 10 ug/dL

## 2022-07-10 MED ORDER — COSYNTROPIN 0.25 MG IJ SOLR
INTRAMUSCULAR | Status: AC
  Administered 2022-07-10: 0.25 mg via INTRAMUSCULAR
  Filled 2022-07-10: qty 0.25

## 2022-07-10 MED ORDER — COSYNTROPIN 0.25 MG IJ SOLR: 0.2500 mg | Freq: Once | INTRAMUSCULAR | Status: AC

## 2022-07-11 MED ORDER — LOSARTAN POTASSIUM 25 MG PO TABS: 12.5000 mg | ORAL_TABLET | Freq: Every day | ORAL | 3 refills | Status: DC

## 2022-07-11 NOTE — Telephone Encounter (Signed)
Pt c/o BP issue: STAT if pt c/o blurred vision, one-sided weakness or slurred speech  1. What are your last 5 BP readings?  169/94 -  10/25 148/101 -10/24 159/88 -  10/23 142/24 -  10/22 148/96 -  10/21 168/90 -  10/22  2. Are you having any other symptoms (ex. Dizziness, headache, blurred vision, passed out)? No   3. What is your BP issue? [name] calling back to give BP reading as asked.

## 2022-07-11 NOTE — Telephone Encounter (Signed)
[name] stated that they would like to try the Cozaar 12.5 mg tablets to see if this helps pt's bp. [name] will update office on bp in 1 week.

## 2022-07-11 NOTE — Addendum Note (Signed)
Addended by: Christella Scheuermann C on: 07/11/2022 11:19 AM   Modules accepted: Orders

## 2022-07-17 ENCOUNTER — Other Ambulatory Visit (HOSPITAL_COMMUNITY)
Admission: RE | Admit: 2022-07-17 | Discharge: 2022-07-17 | Disposition: A | Discharge status: Home / Self Care | Payer: No Typology Code available for payment source | Source: Ambulatory Visit | Attending: "Endocrinology | Admitting: "Endocrinology

## 2022-07-17 DIAGNOSIS — R232 Flushing: Secondary | ICD-10-CM | POA: Insufficient documentation

## 2022-07-17 LAB — CORTISOL: Cortisol, Plasma: 9.8 ug/dL

## 2022-07-18 ENCOUNTER — Telehealth: Payer: Self-pay | Admitting: Cardiology

## 2022-07-18 ENCOUNTER — Other Ambulatory Visit (HOSPITAL_COMMUNITY)
Admission: RE | Admit: 2022-07-18 | Discharge: 2022-07-18 | Disposition: A | Discharge status: Home / Self Care | Payer: No Typology Code available for payment source | Source: Ambulatory Visit | Attending: "Endocrinology | Admitting: "Endocrinology

## 2022-07-18 DIAGNOSIS — R232 Flushing: Secondary | ICD-10-CM | POA: Diagnosis present

## 2022-07-18 MED ORDER — FLUDROCORTISONE ACETATE 0.1 MG PO TABS: 0.1000 mg | ORAL_TABLET | Freq: Every day | ORAL | 3 refills | Status: DC

## 2022-07-18 NOTE — Telephone Encounter (Signed)
Refill complete 

## 2022-07-18 NOTE — Telephone Encounter (Signed)
Pt c/o medication issue:  1. Name of Medication: fludrocort   2. How are you currently taking this medication (dosage and times per day)? 1 tablet daily  3. Are you having a reaction (difficulty breathing--STAT)? no  4. What is your medication issue? Patient's wife calling to discuss refilling but states it is a different process and needs to speak with a nurse about it.

## 2022-07-19 ENCOUNTER — Other Ambulatory Visit (HOSPITAL_COMMUNITY)
Admission: RE | Admit: 2022-07-19 | Discharge: 2022-07-19 | Disposition: A | Discharge status: Home / Self Care | Payer: No Typology Code available for payment source | Source: Ambulatory Visit | Attending: "Endocrinology | Admitting: "Endocrinology

## 2022-07-19 DIAGNOSIS — R232 Flushing: Secondary | ICD-10-CM | POA: Diagnosis present

## 2022-07-20 LAB — TESTOSTERONE,FREE AND TOTAL
Testosterone, Free: 2.9 pg/mL — ABNORMAL LOW (ref 6.6–18.1)
Testosterone: 385 ng/dL (ref 264–916)

## 2022-07-21 LAB — CATECHOLAMINES,UR.,FREE,24 HR
Dopamine, Rand Ur: 69 ug/L
Dopamine, Ur, 24Hr: 173 ug/24 hr (ref 0–510)
Epinephrine, Rand Ur: <1 ug/L
Epinephrine, U, 24Hr: <3 ug/24 hr (ref 0–20)
Norepinephrine, Rand Ur: 18 ug/L
Norepinephrine,U,24H: 45 ug/24 hr (ref 0–135)
Total Volume: 2500

## 2022-07-23 ENCOUNTER — Telehealth: Payer: Self-pay | Admitting: Cardiology

## 2022-07-23 LAB — METANEPHRINES, URINE, 24 HOUR
Metaneph Total, Ur: 44 ug/L
Metanephrines, 24H Ur: 110 ug/24 hr (ref 58–276)
Normetanephrine, 24H Ur: 315 ug/24 hr (ref 156–729)
Normetanephrine, Ur: 126 ug/L
Total Volume: 2500

## 2022-07-23 MED ORDER — LOSARTAN POTASSIUM 25 MG PO TABS: 25.0000 mg | ORAL_TABLET | Freq: Every day | ORAL | 3 refills | Status: DC

## 2022-07-23 NOTE — Telephone Encounter (Signed)
Debbie pt's wife ( ok per DPR) notified to increase Losartan to 25 mg Daily.

## 2022-07-23 NOTE — Telephone Encounter (Signed)
Pt c/o BP issue: STAT if pt c/o blurred vision, one-sided weakness or slurred speech  1. What are your last 5 BP readings?  This morning: 180/109 Yesterday: 187/85 183/103 169/90 173/80 161/98   All taken after medication   2. Are you having any other symptoms (ex. Dizziness, headache, blurred vision, passed out)? "Not feeling good and head feels cloudy"   3. What is your BP issue? Pt spouse wanted to relay bp readings

## 2022-07-24 NOTE — Progress Notes (Unsigned)
Cardiology Office Note  Date: 07/25/2022   ID: Benjamin [name], Benjamin [name] Dec 06, 1950, MRN [id number]  PCP:  [facility]  Cardiologist:  Rozann Lesches, MD Electrophysiologist:  None   Chief Complaint  Patient presents with   Cardiac follow-up    History of Present Illness: Benjamin [name] is a 71 y.o. male last seen in September.  He is here today with his wife for follow-up visit.  We went over his home blood pressure checks, remains hypertensive.  He has not had any episodes of orthostatic dizziness or syncope.  Correlation of his home blood pressure cuff with ours shows that his is about 10 mmHg higher for both systolic and diastolic.  I reviewed his medications, he had actually been taking Cozaar 25 mg daily on a consistent basis even up to the recent recommendation for dose adjustment.  We have discussed increasing this to 50 mg daily for now, otherwise he remains on Lopressor and Eliquis as before.  Past Medical History:  Diagnosis Date   Arthritis    Essential hypertension    Gout    History of stroke    Right occipital May 2018   Hypertensive heart disease    LVH with grade 2 diastolic dysfunction   Mixed hyperlipidemia    Statin intolerance   Rotator cuff tear    Chronic pain   Sleep apnea    a. intolerant to CPAP   Stroke ([name]) 10/1998   TIA   Tubular adenoma    Type 2 diabetes mellitus ([name])     Past Surgical History:  Procedure Laterality Date   APPENDECTOMY     CARPAL TUNNEL RELEASE     COLONOSCOPY N/A 10/29/2013   Dr. [name]- sigmoid polyp status post cold snare removal. large sessile ascending colon polyp. debulked with saline assisted snare polypectomy . pancolonic diverticulosis. inadequate prep. tubular adenoma on bx   COLONOSCOPY N/A 02/22/2014   Dr. [name]: Saline-assisted snare polypectomy/biopsy for sprawling carpet polyp in the ascending colon which could not be completely removed. Status post biopsy (tubular adenoma), tattooed    COLONOSCOPY N/A 03/01/2014   SWF:UXNA polypectomy hemorrhage s/p bleeding   COLONOSCOPY N/A 06/08/2015   Procedure: COLONOSCOPY;  Surgeon: [name], MD;  [facility];  Service: Endoscopy;  Laterality: N/A;  0900   COLONOSCOPY WITH PROPOFOL N/A 11/14/2020   Procedure: COLONOSCOPY WITH PROPOFOL;  Surgeon: [name], MD;  [facility];  Service: Endoscopy;  Laterality: N/A;  8:15am   ESOPHAGOGASTRODUODENOSCOPY N/A 02/22/2014   Dr. [name]: Erosive reflux esophagitis. Schatzki ring status post dilation. Gastric and duodenal erosions with benign biopsies.   ESOPHAGOGASTRODUODENOSCOPY (EGD) WITH PROPOFOL N/A 06/07/2022   Procedure: ESOPHAGOGASTRODUODENOSCOPY (EGD) WITH PROPOFOL;  Surgeon: Eloise Harman, DO;  [facility];  Service: Endoscopy;  Laterality: N/A;   LAPAROSCOPIC RIGHT COLECTOMY N/A 04/16/2014   Procedure: LAPAROSCOPIC ASSISTED RIGHT COLECTOMY;  Surgeon: Benjamin [name] Hector, MD;  Location: [facility];  Service: General;  Laterality: N/A;   MALONEY DILATION N/A 02/22/2014   Procedure: [name];  Surgeon: [name], MD;  [facility];  Service: Endoscopy;  Laterality: N/A;   POLYPECTOMY  06/07/2022   Procedure: POLYPECTOMY;  Surgeon: Eloise Harman, DO;  [facility];  Service: Endoscopy;;   SAVORY DILATION N/A 02/22/2014   Procedure: SAVORY DILATION;  Surgeon: [name], MD;  [facility];  Service: Endoscopy;  Laterality: N/A;    Current Outpatient Medications  Medication Sig Dispense Refill   acetaminophen (TYLENOL) 500 MG tablet Take 1,000 mg by mouth every 6 (six) hours as needed.     allopurinol (ZYLOPRIM) 100 MG tablet Take 100 mg by mouth daily.     apixaban (ELIQUIS) 5 MG TABS tablet Take 1 tablet (5 mg total) by mouth 2 (two) times daily. 60 tablet 1   cholecalciferol (VITAMIN D3) 25 MCG (1000 UNIT) tablet Take 1,000 Units by mouth daily.     empagliflozin (JARDIANCE) 25 MG TABS  tablet Take 12.5 mg by mouth daily. Half tablet daily     ezetimibe (ZETIA) 10 MG tablet TAKE ONE TABLET BY MOUTH DAILY FOR CHOLESTEROL     fexofenadine (ALLEGRA) 60 MG tablet Take 60 mg by mouth daily.     fludrocortisone (FLORINEF) 0.1 MG tablet Take 1 tablet (0.1 mg total) by mouth daily. 90 tablet 3   guaifenesin (HUMIBID E) 400 MG TABS tablet TAKE ONE TABLET BY MOUTH TWICE A DAY AS NEEDED FOR CONGESTION     insulin glargine (LANTUS) 100 UNIT/ML injection Inject 40 Units into the skin daily. IN THE MORNING     losartan (COZAAR) 50 MG tablet Take 1 tablet (50 mg total) by mouth daily. 90 tablet 1   metoprolol tartrate (LOPRESSOR) 25 MG tablet Take 1 tablet (25 mg total) by mouth 2 (two) times daily. 180 tablet 3   OVER THE COUNTER MEDICATION Pain relieving cream     pantoprazole (PROTONIX) 40 MG tablet Take 1 tablet (40 mg total) by mouth in the morning and at bedtime. 60 tablet 4   Semaglutide, 1 MG/DOSE, 2 MG/1.5ML SOPN Inject 1 mg into the skin every Wednesday.      sodium chloride (OCEAN) 0.65 % SOLN nasal spray Place 1 spray into both nostrils as needed for congestion.     Tafluprost, PF, 0.0015 % SOLN Place 1 drop into both eyes every evening.     Timolol Maleate PF 0.5 % SOLN Place 1 drop into both eyes in the morning and at bedtime.     No current facility-administered medications for this visit.   Allergies:  Niacin-lovastatin er, Statins, Tamsulosin, and Zocor [simvastatin - high dose]   ROS: No palpitations.  Physical Exam: VS:  BP (!) 140/92   Pulse 83   Ht '6\' 2"'$  (1.88 m)   Wt 259 lb 14.4 oz (117.9 kg)   SpO2 95%   BMI 33.37 kg/m , BMI Body mass index is 33.37 kg/m.  Wt Readings from Last 3 Encounters:  07/25/22 259 lb 14.4 oz (117.9 kg)  06/28/22 253 lb 9.6 oz (115 kg)  06/11/22 251 lb (113.9 kg)    General: Patient appears comfortable at rest. HEENT: Conjunctiva and lids normal. Neck: Supple, no elevated JVP or carotid bruits. Lungs: Clear to auscultation,  nonlabored breathing at rest. Cardiac: Irregular, no S3, 1/6 systolic murmur.  ECG:  An ECG dated 04/11/2022 was personally reviewed today and demonstrated:  Atrial fibrillation with right bundle branch block.  Recent Labwork: 06/05/2022: ALT 26; AST 24; Magnesium 2.1; TSH 0.927 06/07/2022: BUN 17; Creatinine, Ser 1.40; Hemoglobin 15.6; Platelets 202; Potassium 3.3; Sodium 139     Component Value Date/Time   CHOL 180 01/22/2017 0506   TRIG 391 (H) 01/22/2017 0506   HDL 25 (L) 01/22/2017 0506   CHOLHDL 7.2 01/22/2017 0506   VLDL 78 (H) 01/22/2017 0506   LDLCALC 77 01/22/2017 0506    Other Studies Reviewed Today:  Echocardiogram 04/12/2022:  1. Left ventricular ejection fraction,  by estimation, is 60 to 65%. The  left ventricle has normal function. The left ventricle demonstrates  regional wall motion abnormalities (see scoring diagram/findings for  description). There is mild left ventricular   hypertrophy. Left ventricular diastolic parameters are indeterminate.   2. Right ventricular systolic function is normal. The right ventricular  size is normal. Tricuspid regurgitation signal is inadequate for assessing  PA pressure.   3. Left atrial size was mildly dilated.   4. The mitral valve is abnormal. Mild mitral valve regurgitation. No  evidence of mitral stenosis.   5. The aortic valve has an indeterminant number of cusps. There is  moderate calcification of the aortic valve. There is moderate thickening  of the aortic valve. Aortic valve regurgitation is not visualized. No  aortic stenosis is present.   6. There is mild dilatation of the ascending aorta, measuring 41 mm.   7. The inferior vena cava is normal in size with greater than 50%  respiratory variability, suggesting right atrial pressure of 3 mmHg.   Assessment and Plan:  1.  Essential hypertension complicated by orthostatic hypotension although symptomatically stable most recently.  Medications have been adjusted.  For  now we will increase his Cozaar to 50 mg daily, continue Florinef although eventually plan to stop this if his blood pressure further stabilizes and he has no further symptoms.  2.  Persistent/permanent atrial fibrillation with CHA2DS2-VASc score of 6.  He remains on Eliquis for stroke prophylaxis, continue Lopressor at 25 mg twice daily for heart rate control which looks to be adequate today.  Medication Adjustments/Labs and Tests Ordered: Current medicines are reviewed at length with the patient today.  Concerns regarding medicines are outlined above.   Tests Ordered: No orders of the defined types were placed in this encounter.   Medication Changes: Meds ordered this encounter  Medications   losartan (COZAAR) 50 MG tablet    Sig: Take 1 tablet (50 mg total) by mouth daily.    Dispense:  90 tablet    Refill:  1    07/25/2022 dose increase    Disposition:  Follow up  3 months.  Signed, Satira Sark, MD, [facility] 07/25/2022 1:19 PM    [facility] [address], [place], [place] [address] Phone: [phone]; Fax: [phone]

## 2022-07-25 ENCOUNTER — Ambulatory Visit: Payer: No Typology Code available for payment source | Attending: Cardiology | Admitting: Cardiology

## 2022-07-25 ENCOUNTER — Encounter: Payer: Self-pay | Admitting: Cardiology

## 2022-07-25 ENCOUNTER — Ambulatory Visit: Payer: No Typology Code available for payment source | Admitting: "Endocrinology

## 2022-07-25 VITALS — BP 140/92 | HR 83 | Ht 74.0 in | Wt 259.9 lb

## 2022-07-25 DIAGNOSIS — I10 Essential (primary) hypertension: Secondary | ICD-10-CM | POA: Diagnosis not present

## 2022-07-25 DIAGNOSIS — I4821 Permanent atrial fibrillation: Secondary | ICD-10-CM

## 2022-07-25 MED ORDER — LOSARTAN POTASSIUM 50 MG PO TABS: 50.0000 mg | ORAL_TABLET | Freq: Every day | ORAL | 1 refills | Status: DC

## 2022-07-25 NOTE — Patient Instructions (Addendum)
Medication Instructions:  Your physician has recommended you make the following change in your medication:  Increase losartan to 50 mg daily Continue other medications the same  Labwork: none  Testing/Procedures: none  Follow-Up: Your physician recommends that you schedule a follow-up appointment in: 3 months  Any Other Special Instructions Will Be Listed Below (If Applicable).  If you need a refill on your cardiac medications before your next appointment, please call your pharmacy.

## 2022-07-27 ENCOUNTER — Other Ambulatory Visit: Payer: Self-pay

## 2022-07-27 DIAGNOSIS — R232 Flushing: Secondary | ICD-10-CM

## 2022-07-27 LAB — MISC LABCORP TEST (SEND OUT): Labcorp test code: 281279

## 2022-07-30 ENCOUNTER — Ambulatory Visit: Payer: No Typology Code available for payment source | Admitting: "Endocrinology

## 2022-08-13 ENCOUNTER — Emergency Department (HOSPITAL_COMMUNITY)
Admission: EM | Admit: 2022-08-13 | Discharge: 2022-08-13 | Discharge status: Against Medical Advice | Payer: No Typology Code available for payment source | Attending: Emergency Medicine | Admitting: Emergency Medicine

## 2022-08-13 ENCOUNTER — Telehealth: Payer: Self-pay | Admitting: Cardiology

## 2022-08-13 ENCOUNTER — Encounter (HOSPITAL_COMMUNITY): Payer: Self-pay | Admitting: *Deleted

## 2022-08-13 ENCOUNTER — Ambulatory Visit: Payer: No Typology Code available for payment source

## 2022-08-13 DIAGNOSIS — R232 Flushing: Secondary | ICD-10-CM | POA: Diagnosis not present

## 2022-08-13 DIAGNOSIS — I10 Essential (primary) hypertension: Secondary | ICD-10-CM | POA: Insufficient documentation

## 2022-08-13 DIAGNOSIS — Z5321 Procedure and treatment not carried out due to patient leaving prior to being seen by health care provider: Secondary | ICD-10-CM | POA: Insufficient documentation

## 2022-08-13 DIAGNOSIS — R519 Headache, unspecified: Secondary | ICD-10-CM | POA: Insufficient documentation

## 2022-08-13 DIAGNOSIS — R42 Dizziness and giddiness: Secondary | ICD-10-CM | POA: Diagnosis present

## 2022-08-13 NOTE — Telephone Encounter (Signed)
Pt c/o BP issue: STAT if pt c/o blurred vision, one-sided weakness or slurred speech  1. What are your last 5 BP readings?  230/105 - Mechanically checked (Today) 200/95 - Manual check (Today)  2. Are you having any other symptoms (ex. Dizziness, headache, blurred vision, passed out)? No  3. What is your BP issue? States that pt is in the office and BP is elevated.

## 2022-08-13 NOTE — Telephone Encounter (Signed)
Pt c/o BP issue: STAT if pt c/o blurred vision, one-sided weakness or slurred speech  1. What are your last 5 BP readings?   230/105 (mechanical) 200/95 (manual)  2. Are you having any other symptoms (ex. Dizziness, headache, blurred vision, passed out)?   No  3. What is your BP issue?   Caller stated patient's BP readings have been running high and he may need to have his medication adjusted.

## 2022-08-13 NOTE — Telephone Encounter (Signed)
Spoke with Benjamin Situ, NP that request that pt be seen today for a nurse visit d/t having an elevated BP in their office. Nurse appt made for today at 4pm. Pt was seen at the [facility] to for a follow up visit and noted to have an elevated BP. Please advise.

## 2022-08-13 NOTE — ED Triage Notes (Signed)
States he was seen at [place] this am and was told his blood pressure was high c/o dizziness, c/o headache.

## 2022-08-13 NOTE — ED Notes (Signed)
Pt up at SORT desk , upset about wait times, encouragement and support provided, pt states he is leaving the ED.

## 2022-08-14 ENCOUNTER — Ambulatory Visit: Payer: No Typology Code available for payment source | Attending: Internal Medicine | Admitting: *Deleted

## 2022-08-14 VITALS — BP 162/84 | HR 68 | Wt 255.0 lb

## 2022-08-14 DIAGNOSIS — Z0130 Encounter for examination of blood pressure without abnormal findings: Secondary | ICD-10-CM

## 2022-08-14 NOTE — Progress Notes (Signed)
Pt states that on yesterday he had a headache all day long. Pt does c/o dizziness on yesterday. He denies headache and dizziness on today. States that he went to the [facility] ED but left with out being seen.

## 2022-08-15 ENCOUNTER — Encounter: Payer: Self-pay | Admitting: Cardiology

## 2022-08-15 ENCOUNTER — Telehealth: Payer: Self-pay | Admitting: Cardiology

## 2022-08-15 MED ORDER — LOSARTAN POTASSIUM 50 MG PO TABS: 75.0000 mg | ORAL_TABLET | Freq: Every day | ORAL | 3 refills | Status: DC

## 2022-08-15 NOTE — Telephone Encounter (Signed)
Pt notified to stop Florinef and increase Cozaar to 75 mg Daily. Pt agrees with plan of care.

## 2022-08-15 NOTE — Telephone Encounter (Signed)
Pt notified of medications changes and agrees with plan of care.

## 2022-08-15 NOTE — Telephone Encounter (Signed)
Pt states that on yesterday he had a headache all day long. Pt does c/o dizziness on yesterday. He denies headache and dizziness on today. States that he went to the [facility] ED but left with out being seen.     162/84  68 hr

## 2022-08-15 NOTE — Telephone Encounter (Signed)
Spouse calling back to f/u on what the next step is, as far as pt's BP issues.( Refer to 11/27 notes) Please advise

## 2022-08-18 LAB — 5 HIAA W/CREATININE, 24 HR
5-HIAA, URINE: 7.2 mg/24 h — ABNORMAL HIGH (ref <=6.0)
CREATININE, URINE: 2.48 g/(24.h) — ABNORMAL HIGH (ref 0.50–2.15)
TOTAL VOLUME: 2800 mL

## 2022-08-27 ENCOUNTER — Telehealth: Payer: Self-pay

## 2022-08-27 NOTE — Telephone Encounter (Addendum)
     Patient  visit on 08/13/2022  at [facility] for hypertension.   Have you been able to follow up with your primary care physician? Patient stated that he left the emergency department without being seen due to the wait.  He went to his primary care physician to be treated. He declined to answer any further questions.  The patient was or was not able to obtain any needed medicine or equipment.  Are there diet recommendations that you are having difficulty following?  Patient expresses understanding of discharge instructions and education provided has no other needs at this time.    [place] [facility] Care Guide   [email or web address]  ?? [phone]   Website: [email or web address]  [email or web address]

## 2022-10-23 ENCOUNTER — Observation Stay (HOSPITAL_COMMUNITY)
Admission: EM | Admit: 2022-10-23 | Discharge: 2022-10-24 | Disposition: A | Discharge status: Home / Self Care | Payer: No Typology Code available for payment source | Attending: Internal Medicine | Admitting: Internal Medicine

## 2022-10-23 ENCOUNTER — Encounter (HOSPITAL_COMMUNITY): Payer: Self-pay

## 2022-10-23 ENCOUNTER — Other Ambulatory Visit: Payer: Self-pay

## 2022-10-23 ENCOUNTER — Emergency Department (HOSPITAL_COMMUNITY): Payer: No Typology Code available for payment source

## 2022-10-23 ENCOUNTER — Telehealth: Payer: Self-pay | Admitting: Cardiology

## 2022-10-23 ENCOUNTER — Telehealth: Payer: Self-pay | Admitting: "Endocrinology

## 2022-10-23 DIAGNOSIS — E538 Deficiency of other specified B group vitamins: Secondary | ICD-10-CM | POA: Insufficient documentation

## 2022-10-23 DIAGNOSIS — R2681 Unsteadiness on feet: Secondary | ICD-10-CM

## 2022-10-23 DIAGNOSIS — I482 Chronic atrial fibrillation, unspecified: Secondary | ICD-10-CM | POA: Diagnosis not present

## 2022-10-23 DIAGNOSIS — G9341 Metabolic encephalopathy: Secondary | ICD-10-CM | POA: Diagnosis not present

## 2022-10-23 DIAGNOSIS — Z79899 Other long term (current) drug therapy: Secondary | ICD-10-CM | POA: Diagnosis not present

## 2022-10-23 DIAGNOSIS — E785 Hyperlipidemia, unspecified: Secondary | ICD-10-CM | POA: Diagnosis present

## 2022-10-23 DIAGNOSIS — E119 Type 2 diabetes mellitus without complications: Secondary | ICD-10-CM | POA: Insufficient documentation

## 2022-10-23 DIAGNOSIS — Z87891 Personal history of nicotine dependence: Secondary | ICD-10-CM | POA: Insufficient documentation

## 2022-10-23 DIAGNOSIS — R531 Weakness: Secondary | ICD-10-CM

## 2022-10-23 DIAGNOSIS — I10 Essential (primary) hypertension: Secondary | ICD-10-CM | POA: Diagnosis present

## 2022-10-23 DIAGNOSIS — R2689 Other abnormalities of gait and mobility: Secondary | ICD-10-CM | POA: Insufficient documentation

## 2022-10-23 DIAGNOSIS — R4182 Altered mental status, unspecified: Secondary | ICD-10-CM

## 2022-10-23 DIAGNOSIS — I771 Stricture of artery: Secondary | ICD-10-CM | POA: Insufficient documentation

## 2022-10-23 DIAGNOSIS — Z7901 Long term (current) use of anticoagulants: Secondary | ICD-10-CM | POA: Diagnosis not present

## 2022-10-23 DIAGNOSIS — Z8673 Personal history of transient ischemic attack (TIA), and cerebral infarction without residual deficits: Secondary | ICD-10-CM | POA: Diagnosis not present

## 2022-10-23 DIAGNOSIS — M542 Cervicalgia: Secondary | ICD-10-CM | POA: Insufficient documentation

## 2022-10-23 DIAGNOSIS — R55 Syncope and collapse: Secondary | ICD-10-CM | POA: Diagnosis not present

## 2022-10-23 DIAGNOSIS — Z794 Long term (current) use of insulin: Secondary | ICD-10-CM | POA: Diagnosis not present

## 2022-10-23 DIAGNOSIS — E876 Hypokalemia: Secondary | ICD-10-CM | POA: Insufficient documentation

## 2022-10-23 DIAGNOSIS — I6521 Occlusion and stenosis of right carotid artery: Secondary | ICD-10-CM

## 2022-10-23 DIAGNOSIS — E1159 Type 2 diabetes mellitus with other circulatory complications: Secondary | ICD-10-CM

## 2022-10-23 LAB — URINALYSIS, ROUTINE W REFLEX MICROSCOPIC
Bacteria, UA: NONE SEEN
Bilirubin Urine: NEGATIVE
Glucose, UA: >=500 mg/dL — AB
Ketones, ur: NEGATIVE mg/dL
Leukocytes,Ua: NEGATIVE
Nitrite: NEGATIVE
Protein, ur: 30 mg/dL — AB
Specific Gravity, Urine: 1.035 — ABNORMAL HIGH (ref 1.005–1.030)
pH: 5 (ref 5.0–8.0)

## 2022-10-23 LAB — CBC
HCT: 49.3 % (ref 39.0–52.0)
Hemoglobin: 15.8 g/dL (ref 13.0–17.0)
MCH: 28 pg (ref 26.0–34.0)
MCHC: 32 g/dL (ref 30.0–36.0)
MCV: 87.4 fL (ref 80.0–100.0)
Platelets: 197 10*3/uL (ref 150–400)
RBC: 5.64 MIL/uL (ref 4.22–5.81)
RDW: 13.9 % (ref 11.5–15.5)
WBC: 10.3 10*3/uL (ref 4.0–10.5)
nRBC: 0 % (ref 0.0–0.2)

## 2022-10-23 LAB — BASIC METABOLIC PANEL
Anion gap: 12 (ref 5–15)
BUN: 23 mg/dL (ref 8–23)
CO2: 25 mmol/L (ref 22–32)
Calcium: 8.3 mg/dL — ABNORMAL LOW (ref 8.9–10.3)
Chloride: 98 mmol/L (ref 98–111)
Creatinine, Ser: 1.5 mg/dL — ABNORMAL HIGH (ref 0.61–1.24)
GFR, Estimated: 49 mL/min — ABNORMAL LOW (ref >60)
Glucose, Bld: 163 mg/dL — ABNORMAL HIGH (ref 70–99)
Potassium: 3.6 mmol/L (ref 3.5–5.1)
Sodium: 135 mmol/L (ref 135–145)

## 2022-10-23 LAB — GLUCOSE, CAPILLARY: Glucose-Capillary: 108 mg/dL — ABNORMAL HIGH (ref 70–99)

## 2022-10-23 LAB — TROPONIN I (HIGH SENSITIVITY)
Troponin I (High Sensitivity): 12 ng/L (ref <18)
Troponin I (High Sensitivity): 13 ng/L (ref <18)

## 2022-10-23 MED ORDER — THIAMINE MONONITRATE 100 MG PO TABS
500.0000 mg | ORAL_TABLET | Freq: Every day | ORAL | Status: DC
  Administered 2022-10-24: 500 mg via ORAL
  Filled 2022-10-23: qty 5

## 2022-10-23 MED ORDER — INSULIN ASPART 100 UNIT/ML IJ SOLN
0.0000 [IU] | Freq: Three times a day (TID) | INTRAMUSCULAR | Status: DC
  Administered 2022-10-24: 2 [IU] via SUBCUTANEOUS

## 2022-10-23 MED ORDER — METOPROLOL TARTRATE 25 MG PO TABS
25.0000 mg | ORAL_TABLET | Freq: Two times a day (BID) | ORAL | Status: DC
  Administered 2022-10-23 – 2022-10-24 (×2): 25 mg via ORAL
  Filled 2022-10-23 (×2): qty 1

## 2022-10-23 MED ORDER — INSULIN ASPART 100 UNIT/ML IJ SOLN: 0.0000 [IU] | Freq: Every day | INTRAMUSCULAR | Status: DC

## 2022-10-23 MED ORDER — PANTOPRAZOLE SODIUM 40 MG PO TBEC
40.0000 mg | DELAYED_RELEASE_TABLET | Freq: Every day | ORAL | Status: DC
  Administered 2022-10-24: 40 mg via ORAL
  Filled 2022-10-23: qty 1

## 2022-10-23 MED ORDER — ACETAMINOPHEN 325 MG PO TABS: 650.0000 mg | ORAL_TABLET | Freq: Four times a day (QID) | ORAL | Status: DC | PRN

## 2022-10-23 MED ORDER — INSULIN DETEMIR 100 UNIT/ML ~~LOC~~ SOLN
20.0000 [IU] | Freq: Every day | SUBCUTANEOUS | Status: DC
  Administered 2022-10-23: 20 [IU] via SUBCUTANEOUS
  Filled 2022-10-23 (×2): qty 0.2

## 2022-10-23 MED ORDER — IOHEXOL 350 MG/ML SOLN
60.0000 mL | Freq: Once | INTRAVENOUS | Status: AC | PRN
  Administered 2022-10-23: 60 mL via INTRAVENOUS

## 2022-10-23 MED ORDER — LOSARTAN POTASSIUM 50 MG PO TABS
75.0000 mg | ORAL_TABLET | Freq: Every day | ORAL | Status: DC
  Administered 2022-10-24: 75 mg via ORAL
  Filled 2022-10-23: qty 2

## 2022-10-23 MED ORDER — ACETAMINOPHEN 650 MG RE SUPP: 650.0000 mg | Freq: Four times a day (QID) | RECTAL | Status: DC | PRN

## 2022-10-23 MED ORDER — ONDANSETRON HCL 4 MG/2ML IJ SOLN: 4.0000 mg | Freq: Four times a day (QID) | INTRAMUSCULAR | Status: DC | PRN

## 2022-10-23 MED ORDER — OXYCODONE HCL 5 MG PO TABS
5.0000 mg | ORAL_TABLET | ORAL | Status: DC | PRN
  Administered 2022-10-23 – 2022-10-24 (×2): 5 mg via ORAL
  Filled 2022-10-23 (×2): qty 1

## 2022-10-23 MED ORDER — ONDANSETRON HCL 4 MG PO TABS: 4.0000 mg | ORAL_TABLET | Freq: Four times a day (QID) | ORAL | Status: DC | PRN

## 2022-10-23 MED ORDER — SODIUM CHLORIDE 0.9 % IV SOLN: INTRAVENOUS | Status: DC

## 2022-10-23 MED ORDER — EZETIMIBE 10 MG PO TABS
10.0000 mg | ORAL_TABLET | Freq: Every day | ORAL | Status: DC
  Administered 2022-10-24: 10 mg via ORAL
  Filled 2022-10-23: qty 1

## 2022-10-23 MED ORDER — APIXABAN 5 MG PO TABS
5.0000 mg | ORAL_TABLET | Freq: Two times a day (BID) | ORAL | Status: DC
  Administered 2022-10-23 – 2022-10-24 (×2): 5 mg via ORAL
  Filled 2022-10-23 (×2): qty 1

## 2022-10-23 NOTE — ED Provider Notes (Signed)
Benjamin [name]   CSN: [id number] Arrival date & time: 10/23/22  1510     History {Add pertinent medical, surgical, social history, OB history to HPI:1} Chief Complaint  Patient presents with   Near Syncope    Benjamin [name] is a 72 y.o. male.  Here with a complaint of general weakness it has been going on for a month or so.  He feels like when he walks he is barely able to pick up his feet and he shuffles.  He has not had much of an appetite.  Yesterday when he stood up from bed he was lightheaded and fell to the floor hitting his head.  He does not feel he lost consciousness at that time but then he got up and rested for a while.  Later when he tried to get up again he had a syncopal event.  He talked to his cardiologist today who recommended he come to the emergency department for evaluation.  He denies any chest pain or shortness of breath.  Wife said he has been confused possibly mixing up his medications dropping things.  This has been going on for over a month.  She said he had syncopal events over the summer due to low blood pressure and needed to have medications adjusted.  The history is provided by the patient and the spouse.  Near Syncope This is a recurrent problem. The current episode started yesterday. The problem has been resolved. Pertinent negatives include no chest pain, no abdominal pain, no headaches and no shortness of breath. The symptoms are aggravated by standing. The symptoms are relieved by rest. He has tried rest for the symptoms. The treatment provided moderate relief.       Home Medications Prior to Admission medications   Medication Sig Start Date End Date Taking? Authorizing Provider  acetaminophen (TYLENOL) 500 MG tablet Take 1,000 mg by mouth every 6 (six) hours as needed.    [provider]  allopurinol (ZYLOPRIM) 100 MG tablet Take 100 mg by mouth daily.    [provider]   apixaban (ELIQUIS) 5 MG TABS tablet Take 1 tablet (5 mg total) by mouth 2 (two) times daily. 06/08/22   Roxan Hockey, MD  cholecalciferol (VITAMIN D3) 25 MCG (1000 UNIT) tablet Take 1,000 Units by mouth daily.    [provider]  empagliflozin (JARDIANCE) 25 MG TABS tablet Take 12.5 mg by mouth daily. Half tablet daily    [provider]  ezetimibe (ZETIA) 10 MG tablet TAKE ONE TABLET BY MOUTH DAILY FOR CHOLESTEROL 10/24/21   [provider]  fexofenadine (ALLEGRA) 60 MG tablet Take 60 mg by mouth daily. 05/16/22   [provider]  guaifenesin (HUMIBID E) 400 MG TABS tablet TAKE ONE TABLET BY MOUTH TWICE A DAY AS NEEDED FOR CONGESTION 08/07/21   [provider]  insulin glargine (LANTUS) 100 UNIT/ML injection Inject 40 Units into the skin daily. IN THE MORNING    [provider]  losartan (COZAAR) 50 MG tablet Take 1.5 tablets (75 mg total) by mouth daily. 08/15/22 08/10/23  Satira Sark, MD  metoprolol tartrate (LOPRESSOR) 25 MG tablet Take 1 tablet (25 mg total) by mouth 2 (two) times daily. 06/11/22   Satira Sark, MD  OVER THE COUNTER MEDICATION Pain relieving cream    [provider]  pantoprazole (PROTONIX) 40 MG tablet Take 1 tablet (40 mg total) by mouth in the morning and at bedtime.  06/07/22   Roxan Hockey, MD  Semaglutide, 1 MG/DOSE, 2 MG/1.5ML SOPN Inject 1 mg into the skin every Wednesday.     [provider]  sodium chloride (OCEAN) 0.65 % SOLN nasal spray Place 1 spray into both nostrils as needed for congestion.    [provider]  Tafluprost, PF, 0.0015 % SOLN Place 1 drop into both eyes every evening. 01/10/22   [provider]  Timolol Maleate PF 0.5 % SOLN Place 1 drop into both eyes in the morning and at bedtime. 01/10/22   [provider]      Allergies    Niacin-lovastatin er, Statins, Tamsulosin, and Zocor [simvastatin - high dose]    Review of Systems   Review  of Systems  Constitutional:  Positive for appetite change and fatigue. Negative for fever.  Eyes:  Negative for visual disturbance.  Respiratory:  Negative for shortness of breath.   Cardiovascular:  Positive for near-syncope. Negative for chest pain.  Gastrointestinal:  Negative for abdominal pain.  Genitourinary:  Negative for dysuria.  Musculoskeletal:  Positive for gait problem.  Neurological:  Positive for syncope and light-headedness. Negative for headaches.    Physical Exam Updated Vital Signs BP (!) 173/108 (BP Location: Right Arm)   Pulse 77   Temp 98.2 F (36.8 C) (Oral)   Resp 18   Ht '6\' 2"'$  (1.88 m)   Wt 113.4 kg   SpO2 97%   BMI 32.10 kg/m  Physical Exam Vitals and nursing [name] reviewed.  Constitutional:      General: He is not in acute distress.    Appearance: Normal appearance. He is well-developed.  HENT:     Head: Normocephalic and atraumatic.  Eyes:     Conjunctiva/sclera: Conjunctivae normal.  Cardiovascular:     Rate and Rhythm: Normal rate and regular rhythm.     Heart sounds: No murmur heard. Pulmonary:     Effort: Pulmonary effort is normal. No respiratory distress.     Breath sounds: Normal breath sounds.  Abdominal:     Palpations: Abdomen is soft.     Tenderness: There is no abdominal tenderness. There is no guarding or rebound.  Musculoskeletal:        General: No deformity. Normal range of motion.     Cervical back: Neck supple.  Skin:    General: Skin is warm and dry.     Capillary Refill: Capillary refill takes less than 2 seconds.  Neurological:     General: No focal deficit present.     Mental Status: He is alert.     Sensory: No sensory deficit.     Motor: No weakness.     ED Results / Procedures / Treatments   Labs (all labs ordered are listed, but only abnormal results are displayed) Labs Reviewed  BASIC METABOLIC PANEL - Abnormal; Notable for the following components:      Result Value   Glucose, Bld 163 (*)     Creatinine, Ser 1.50 (*)    Calcium 8.3 (*)    GFR, Estimated 49 (*)    All other components within normal limits  CBC  URINALYSIS, ROUTINE W REFLEX MICROSCOPIC  CBG MONITORING, ED  TROPONIN I (HIGH SENSITIVITY)    EKG EKG Interpretation  Date/Time:  Tuesday October 23 2022 16:10:01 EST Ventricular Rate:  68 PR Interval:    QRS Duration: 168 QT Interval:  452 QTC Calculation: 480 R Axis:   165 Text Interpretation: Atrial flutter with variable A-V block Right bundle  branch block Left posterior fascicular block *** Bifascicular block *** Abnormal ECG When compared with ECG of 10-Nov-2020 13:08, Atrial flutter has replaced Atrial fibrillation Right bundle branch block has replaced Incomplete right bundle branch block Confirmed by Aletta Edouard [phone]) on 10/23/2022 4:14:55 PM  Radiology CT ANGIO HEAD NECK W WO CM  Result Date: 10/23/2022 CLINICAL DATA:  Syncope/presyncope. EXAM: CT ANGIOGRAPHY HEAD AND NECK TECHNIQUE: Multidetector CT imaging of the head and neck was performed using the standard protocol during bolus administration of intravenous contrast. Multiplanar CT image reconstructions and MIPs were obtained to evaluate the vascular anatomy. Carotid stenosis measurements (when applicable) are obtained utilizing NASCET criteria, using the distal internal carotid diameter as the denominator. RADIATION DOSE REDUCTION: This exam was performed according to the departmental dose-optimization program which includes automated exposure control, adjustment of the mA and/or kV according to patient size and/or use of iterative reconstruction technique. CONTRAST:  90m OMNIPAQUE IOHEXOL 350 MG/ML SOLN COMPARISON:  01/22/2017 FINDINGS: CT HEAD FINDINGS Brain: Brainstem and cerebellum appear normal. There is old infarction in the right occipital lobe. There chronic small-vessel ischemic changes affecting the cerebral hemispheric white matter. No sign of acute infarction, mass lesion, hemorrhage,  hydrocephalus or extra-axial collection. Vascular: No abnormal vascular finding. Skull: Normal Sinuses/Orbits: Clear/normal Other: None Review of the MIP images confirms the above findings CTA NECK FINDINGS Aortic arch: Branching pattern is normal without origin stenosis. Right carotid system: Common carotid artery is patent to the bifurcation. Soft plaque at the carotid bifurcation and ICA bulb. There is no narrowing of the vessel beyond the diameter of the more distal cervical ICA, therefore there is no stenosis. Left carotid system: Common carotid artery patent to the bifurcation. Soft and calcified plaque at the carotid bifurcation and ICA bulb. Minimal diameter in the bulb is the same as the more distal cervical ICA, therefore there is no stenosis. Vertebral arteries: Right vertebral artery origin shows calcified plaque with 50-70% stenosis. Left vertebral artery origin is widely patent. Beyond the origins, both vessels appear normal through the cervical region to the foramen magnum. Skeleton: Ordinary spondylosis.  Chronic fusion at C4-5. Other neck: No mass or lymphadenopathy. Upper chest: Lung apices are clear. Review of the MIP images confirms the above findings CTA HEAD FINDINGS Anterior circulation: Both internal carotid arteries are patent through the skull base and siphon regions. Mild siphon atherosclerotic calcification but no stenosis. The anterior and middle cerebral vessels patent. No large vessel occlusion or proximal stenosis. No aneurysm or vascular malformation. Posterior circulation: Both vertebral arteries are patent through the foramen magnum to the basilar artery. No basilar stenosis. Posterior circulation branch vessels are patent normal. Venous sinuses: Patent and normal. Anatomic variants: None significant. Review of the MIP images confirms the above findings IMPRESSION: 1. No acute head CT finding. Old right occipital infarction. Chronic small-vessel ischemic change of the white matter.  2. No intracranial large vessel occlusion or proximal stenosis. 3. Atherosclerotic disease at both carotid bifurcations but without stenosis. 4. 50-70% stenosis of the right vertebral artery origin. Left vertebral artery widely patent. Electronically Signed   By: MNelson ChimesM.D.   On: 10/23/2022 18:19    Procedures Procedures  {Document cardiac monitor, telemetry assessment procedure when appropriate:1}  Medications Ordered in ED Medications  iohexol (OMNIPAQUE) 350 MG/ML injection 60 mL (60 mLs Intravenous Contrast Given 10/23/22 1801)    ED Course/ Medical Decision Making/ A&P   {   Click here for ABCD2, HEART and other calculatorsREFRESH [name] before signing :1}  Medical Decision Making Amount and/or Complexity of Data Reviewed Labs: ordered.   This patient complains of ***; this involves an extensive number of treatment Options and is a complaint that carries with it a high risk of complications and morbidity. The differential includes ***  I ordered, reviewed and interpreted labs, which included *** I ordered medication *** and reviewed PMP when indicated. I ordered imaging studies which included *** and I independently    visualized and interpreted imaging which showed *** Additional history obtained from *** Previous records obtained and reviewed *** I consulted *** and discussed lab and imaging findings and discussed disposition.  Cardiac monitoring reviewed, *** Social determinants considered, *** Critical Interventions: ***  After the interventions stated above, I reevaluated the patient and found *** Admission and further testing considered, ***   {Document critical care time when appropriate:1} {Document review of labs and clinical decision tools ie heart score, Chads2Vasc2 etc:1}  {Document your independent review of radiology images, and any outside records:1} {Document your discussion with family members, caretakers, and with  consultants:1} {Document social determinants of health affecting pt's care:1} {Document your decision making why or why not admission, treatments were needed:1} Final Clinical Impression(s) / ED Diagnoses Final diagnoses:  None    Rx / DC Orders ED Discharge Orders     None

## 2022-10-23 NOTE — Telephone Encounter (Signed)
Wife states patient had severe pain in his neck yesterday and passed out. They called EMS, wife states they told him he would have a long wait and got to his pcp. They called pcp today and was told to see cardiology. Patient still has weakness in legs.  I advised patient to go direct to the ER for evaluation, he agrees, wife will take him.    I will FYI Dr.McDowell

## 2022-10-23 NOTE — Telephone Encounter (Signed)
Pt had his ACTH test along with other labs back in October and November. Does he need any further testing prior to being seen for an appointment?

## 2022-10-23 NOTE — ED Provider Triage Note (Signed)
Emergency Medicine Provider Triage Evaluation Note  Benjamin [name] , a 72 y.o. male  was evaluated in triage.  Pt complains of syncope x 2.  Pt has a lypoma on his left neck that is painful   Review of Systems  Positive: Confusion for 1 month  syncope x 2 Negative: weakness  Physical Exam  BP (!) 173/108 (BP Location: Right Arm)   Pulse 77   Temp 98.2 F (36.8 C) (Oral)   Resp 18   Ht '6\' 2"'$  (1.88 m)   Wt 113.4 kg   SpO2 97%   BMI 32.10 kg/m  Gen:   Awake, no distress   Resp:  Normal effort  MSK:   Moves extremities without difficulty  Other:    Medical Decision Making  Medically screening exam initiated at 5:36 PM.  Appropriate orders placed.  Benjamin [name] was informed that the remainder of the evaluation will be completed by another provider, this initial triage assessment does not replace that evaluation, and the importance of remaining in the ED until their evaluation is complete.     Benjamin [name], [place] 10/23/22 1738

## 2022-10-23 NOTE — Telephone Encounter (Signed)
Pt c/o Syncope: STAT if syncope occurred within 30 minutes and pt complains of lightheadedness High Priority if episode of passing out, completely, today or in last 24 hours   Did you pass out today?   No   When is the last time you passed out?  Yesterday   Has this occurred multiple times?   Last episode was in September   Did you have any symptoms prior to passing out?   Neck hurting him really bad.      Wife stated the patient passed out yesterday about lunchtime.  Wife stated they had EMS check the patient out and they referred the patient to his PCP who referred him to his cardiologist.  Wife stated patient's legs were wobbly and patient told her he feels like he can hardly pick his feet up.

## 2022-10-23 NOTE — Telephone Encounter (Signed)
[name] called and said that we had canceled his appts a couple time due to [facility] testing, but he has done all that was ordered back in November but said that we were going to call once it came back to sch.  Please advise

## 2022-10-23 NOTE — ED Triage Notes (Signed)
Pt reports he almost passed out twice yesterday when getting up out of bed and cardiologist told him to come to the ER.

## 2022-10-24 ENCOUNTER — Observation Stay (HOSPITAL_COMMUNITY): Payer: No Typology Code available for payment source

## 2022-10-24 ENCOUNTER — Observation Stay (HOSPITAL_BASED_OUTPATIENT_CLINIC_OR_DEPARTMENT_OTHER): Payer: No Typology Code available for payment source

## 2022-10-24 DIAGNOSIS — E1122 Type 2 diabetes mellitus with diabetic chronic kidney disease: Secondary | ICD-10-CM | POA: Diagnosis not present

## 2022-10-24 DIAGNOSIS — G9341 Metabolic encephalopathy: Secondary | ICD-10-CM | POA: Diagnosis not present

## 2022-10-24 DIAGNOSIS — R531 Weakness: Secondary | ICD-10-CM

## 2022-10-24 DIAGNOSIS — E785 Hyperlipidemia, unspecified: Secondary | ICD-10-CM

## 2022-10-24 DIAGNOSIS — R55 Syncope and collapse: Secondary | ICD-10-CM | POA: Diagnosis not present

## 2022-10-24 DIAGNOSIS — N1831 Chronic kidney disease, stage 3a: Secondary | ICD-10-CM

## 2022-10-24 DIAGNOSIS — I482 Chronic atrial fibrillation, unspecified: Secondary | ICD-10-CM

## 2022-10-24 DIAGNOSIS — I4891 Unspecified atrial fibrillation: Secondary | ICD-10-CM

## 2022-10-24 DIAGNOSIS — Z794 Long term (current) use of insulin: Secondary | ICD-10-CM

## 2022-10-24 DIAGNOSIS — I10 Essential (primary) hypertension: Secondary | ICD-10-CM

## 2022-10-24 LAB — CBC WITH DIFFERENTIAL/PLATELET
Abs Immature Granulocytes: 0.03 10*3/uL (ref 0.00–0.07)
Basophils Absolute: 0.1 10*3/uL (ref 0.0–0.1)
Basophils Relative: 1 %
Eosinophils Absolute: 0.3 10*3/uL (ref 0.0–0.5)
Eosinophils Relative: 2 %
HCT: 49.1 % (ref 39.0–52.0)
Hemoglobin: 15.8 g/dL (ref 13.0–17.0)
Immature Granulocytes: 0 %
Lymphocytes Relative: 31 %
Lymphs Abs: 3.7 10*3/uL (ref 0.7–4.0)
MCH: 28.2 pg (ref 26.0–34.0)
MCHC: 32.2 g/dL (ref 30.0–36.0)
MCV: 87.7 fL (ref 80.0–100.0)
Monocytes Absolute: 1.1 10*3/uL — ABNORMAL HIGH (ref 0.1–1.0)
Monocytes Relative: 9 %
Neutro Abs: 6.7 10*3/uL (ref 1.7–7.7)
Neutrophils Relative %: 57 %
Platelets: 211 10*3/uL (ref 150–400)
RBC: 5.6 MIL/uL (ref 4.22–5.81)
RDW: 13.9 % (ref 11.5–15.5)
WBC: 11.8 10*3/uL — ABNORMAL HIGH (ref 4.0–10.5)
nRBC: 0 % (ref 0.0–0.2)

## 2022-10-24 LAB — ECHOCARDIOGRAM COMPLETE
AR max vel: 1.91 cm2
AV Area VTI: 1.91 cm2
AV Area mean vel: 1.74 cm2
AV Mean grad: 6.2 mmHg
AV Peak grad: 12.2 mmHg
Ao pk vel: 1.75 m/s
Area-P 1/2: 4.89 cm2
Height: 74 in
S' Lateral: 3.9 cm
Weight: 3943.59 oz

## 2022-10-24 LAB — COMPREHENSIVE METABOLIC PANEL
ALT: 17 U/L (ref 0–44)
AST: 18 U/L (ref 15–41)
Albumin: 3.4 g/dL — ABNORMAL LOW (ref 3.5–5.0)
Alkaline Phosphatase: 107 U/L (ref 38–126)
Anion gap: 11 (ref 5–15)
BUN: 20 mg/dL (ref 8–23)
CO2: 26 mmol/L (ref 22–32)
Calcium: 8.5 mg/dL — ABNORMAL LOW (ref 8.9–10.3)
Chloride: 100 mmol/L (ref 98–111)
Creatinine, Ser: 1.25 mg/dL — ABNORMAL HIGH (ref 0.61–1.24)
GFR, Estimated: >60 mL/min (ref >60)
Glucose, Bld: 101 mg/dL — ABNORMAL HIGH (ref 70–99)
Potassium: 3 mmol/L — ABNORMAL LOW (ref 3.5–5.1)
Sodium: 137 mmol/L (ref 135–145)
Total Bilirubin: 0.8 mg/dL (ref 0.3–1.2)
Total Protein: 7.4 g/dL (ref 6.5–8.1)

## 2022-10-24 LAB — TSH: TSH: 4.901 u[IU]/mL — ABNORMAL HIGH (ref 0.350–4.500)

## 2022-10-24 LAB — GLUCOSE, CAPILLARY
Glucose-Capillary: 92 mg/dL (ref 70–99)
Glucose-Capillary: 128 mg/dL — ABNORMAL HIGH (ref 70–99)

## 2022-10-24 LAB — FOLATE: Folate: 8.2 ng/mL (ref >5.9)

## 2022-10-24 LAB — MAGNESIUM: Magnesium: 2.2 mg/dL (ref 1.7–2.4)

## 2022-10-24 LAB — VITAMIN B12: Vitamin B-12: 380 pg/mL (ref 180–914)

## 2022-10-24 MED ORDER — PERFLUTREN LIPID MICROSPHERE
1.0000 mL | INTRAVENOUS | Status: AC | PRN
  Administered 2022-10-24: 2 mL via INTRAVENOUS

## 2022-10-24 MED ORDER — LOSARTAN POTASSIUM 50 MG PO TABS: 50.0000 mg | ORAL_TABLET | Freq: Every day | ORAL | 3 refills | Status: DC

## 2022-10-24 MED ORDER — VITAMIN B-12 1000 MCG PO TABS: 1000.0000 ug | ORAL_TABLET | Freq: Every day | ORAL | 0 refills | Status: DC

## 2022-10-24 MED ORDER — FOLIC ACID 1 MG PO TABS: 1.0000 mg | ORAL_TABLET | Freq: Every day | ORAL | 0 refills | Status: DC

## 2022-10-24 MED ORDER — DIPHENHYDRAMINE HCL 50 MG/ML IJ SOLN
50.0000 mg | Freq: Once | INTRAMUSCULAR | Status: AC
  Administered 2022-10-24: 50 mg via INTRAVENOUS
  Filled 2022-10-24: qty 1

## 2022-10-24 MED ORDER — ORAL CARE MOUTH RINSE: 15.0000 mL | OROMUCOSAL | Status: DC | PRN

## 2022-10-24 MED ORDER — ABDOMINAL BINDER/ELASTIC 2XL MISC: 1.0000 | 0 refills | Status: AC

## 2022-10-24 MED ORDER — POTASSIUM CHLORIDE CRYS ER 20 MEQ PO TBCR
40.0000 meq | EXTENDED_RELEASE_TABLET | ORAL | Status: AC
  Administered 2022-10-24 (×2): 40 meq via ORAL
  Filled 2022-10-24 (×2): qty 2

## 2022-10-24 NOTE — TOC Progression Note (Signed)
  Transition of Care [facility]) Screening Note   Patient Details  Name: Benjamin [name] Date of Birth: 1950/10/25   Transition of Care [facility]) CM/SW Contact:    Shade Flood, LCSW Phone Number: 10/24/2022, 9:12 AM    [facility] notification complete. Reference number is [id number].  Transition of Care Department [facility]) has reviewed patient and no TOC needs have been identified at this time. We will continue to monitor patient advancement through interdisciplinary progression rounds. If new patient transition needs arise, please place a TOC consult.

## 2022-10-24 NOTE — Evaluation (Signed)
Physical Therapy Evaluation Patient Details Name: Benjamin [name] MRN: [id number] DOB: Dec 22, 1950 Today's Date: 10/24/2022  History of Present Illness  Benjamin [name] is a 72 y.o. male with medical history significant of hypertension, stroke, hyperlipidemia, sleep apnea, type 2 diabetes mellitus, and more presents the ED with a chief complaint of 2 syncopal events.  Patient reports his first event was near syncope.  He caught himself the last minute against the wall before he fell to the ground.  The second time he woke up on the floor.  Patient reports both times he was going from laying to standing.  He did make it about 10 steps the second time before he woke up on the floor.  He reports that he has no preceding symptoms including no dizziness, palpitations, dyspnea.  He reports the only thing out of the norm lately for him is that his neck has been hurting him.  On the left side of his neck he has a lipoma and he reports that it has not hurt in the past, but has been having sharp pain the past couple days.  Nothing is helped it.  He has tried creams and Tylenol.  He reports that his [employer] doctor told him it was a lipoma and its benign.  He has no pain on the right side of his neck.  Patient reports that he had had syncopal episodes back in the summer.  At that time his blood pressure was low.  After that time he was having a GI bleed as well.  Wife reported to the ED provider that last few weeks patient has had shuffling gait, been more confused, and been dropping things.  He does remember if he took his meds or not, and this is all very abnormal for him.  He does report that his legs have weakness.  He does not necessarily think that his upper extremities have been weak as well.  He reports that he does not have asymmetric weakness   Clinical Impression  Patient functioning at baseline for functional mobility and gait with good return for ambulating in room/hallways without loss of balance.  Plan:   Patient discharged from physical therapy to care of nursing for ambulation daily as tolerated for length of stay.         Recommendations for follow up therapy are one component of a multi-disciplinary discharge planning process, led by the attending physician.  Recommendations may be updated based on patient status, additional functional criteria and insurance authorization.  Follow Up Recommendations No PT follow up      Assistance Recommended at Discharge PRN  Patient can return home with the following  Other (comment) (near baseline)    Equipment Recommendations None recommended by PT  Recommendations for Other Services       Functional Status Assessment Patient has not had a recent decline in their functional status     Precautions / Restrictions Precautions Precautions: None Restrictions Weight Bearing Restrictions: No      Mobility  Bed Mobility Overal bed mobility: Independent                  Transfers Overall transfer level: Independent                      Ambulation/Gait Ambulation/Gait assistance: Modified independent (Device/Increase time) Gait Distance (Feet): 200 Feet Assistive device: None Gait Pattern/deviations: WFL(Within Functional Limits) Gait velocity: near normal     General Gait Details: grossly WFL with good  return for ambulating in room, hallways without loss of balance  Stairs            Wheelchair Mobility    Modified Rankin (Stroke Patients Only)       Balance Overall balance assessment: Mild deficits observed, not formally tested                                           Pertinent Vitals/Pain Pain Assessment Pain Assessment: No/denies pain    Home Living Family/patient expects to be discharged to:: Private residence Living Arrangements: Spouse/significant other Available Help at Discharge: Family Type of Home: House Home Access: Stairs to enter;Ramped entrance Entrance  Stairs-Rails: None Entrance Stairs-Number of Steps: 5 Alternate Level Stairs-Number of Steps: 20 going upstairs Home Layout: Multi-level;Able to live on main level with bedroom/bathroom;Full bath on main level Home Equipment: Cane - single point      Prior Function Prior Level of Function : Independent/Modified Independent;Driving             Mobility Comments: Hydrographic surveyor without AD ADLs Comments: Independent     Hand Dominance   Dominant Hand: Right    Extremity/Trunk Assessment   Upper Extremity Assessment Upper Extremity Assessment: Overall WFL for tasks assessed    Lower Extremity Assessment Lower Extremity Assessment: Overall WFL for tasks assessed    Cervical / Trunk Assessment Cervical / Trunk Assessment: Normal  Communication   Communication: No difficulties  Cognition Arousal/Alertness: Awake/alert Behavior During Therapy: WFL for tasks assessed/performed Overall Cognitive Status: Within Functional Limits for tasks assessed                                          General Comments      Exercises     Assessment/Plan    PT Assessment Patient does not need any further PT services  PT Problem List         PT Treatment Interventions      PT Goals (Current goals can be found in the Care Plan section)  Acute Rehab PT Goals Patient Stated Goal: return home PT Goal Formulation: With patient Time For Goal Achievement: 10/24/22 Potential to Achieve Goals: Good    Frequency       Co-evaluation               AM-PAC PT "6 Clicks" Mobility  Outcome Measure Help needed turning from your back to your side while in a flat bed without using bedrails?: None Help needed moving from lying on your back to sitting on the side of a flat bed without using bedrails?: None Help needed moving to and from a bed to a chair (including a wheelchair)?: None Help needed standing up from a chair using your arms (e.g., wheelchair or  bedside chair)?: None Help needed to walk in hospital room?: None Help needed climbing 3-5 steps with a railing? : None 6 Click Score: 24    End of Session   Activity Tolerance: Patient tolerated treatment well Patient left: in bed;with call bell/phone within reach Nurse Communication: Mobility status PT Visit Diagnosis: Unsteadiness on feet (R26.81);Other abnormalities of gait and mobility (R26.89);Muscle weakness (generalized) (M62.81)    Time: 7353-2992 PT Time Calculation (min) (ACUTE ONLY): 11 min   Charges:   PT Evaluation $PT Eval Low  Complexity: 1 Low PT Treatments $Therapeutic Activity: 8-22 mins        2:12 PM, 10/24/22 Lonell Grandchild, MPT Physical Therapist with [facility] [phone] office [phone] mobile phone

## 2022-10-24 NOTE — Progress Notes (Signed)
Abdominal binder given to patient, explained use and patient able to verbalize understanding and demonstrate use. Discharge instructions reviewed with patient, verbalized understanding. Tele and IV removed. Patient transported to private vehicle via wheelchair.

## 2022-10-24 NOTE — Assessment & Plan Note (Signed)
-   Continue ARB, metoprolol

## 2022-10-24 NOTE — Progress Notes (Signed)
  Echocardiogram 2D Echocardiogram has been performed.  Benjamin [name] 10/24/2022, 12:37 PM

## 2022-10-24 NOTE — Telephone Encounter (Signed)
Patient admitted, having CT,MRI, of brain and echo

## 2022-10-24 NOTE — Assessment & Plan Note (Signed)
-   Check TSH - Check for nutritional deficiencies - PT eval and treat - No signs or reported infectious symptoms - Continue to monitor

## 2022-10-24 NOTE — Progress Notes (Signed)
Pt being transported to mri.

## 2022-10-24 NOTE — Telephone Encounter (Signed)
Benjamin [name] stated she spoke with pt's wife this morning and he has been scheduled for follow up visit.

## 2022-10-24 NOTE — Assessment & Plan Note (Signed)
Continue Zetia. °

## 2022-10-24 NOTE — Assessment & Plan Note (Signed)
-   Continue Eliquis and metoprolol 

## 2022-10-24 NOTE — Assessment & Plan Note (Signed)
-   Sliding scale coverage 

## 2022-10-24 NOTE — Assessment & Plan Note (Signed)
-   Subtle - Patient alert and oriented x 3 at the time of my exam - Family reported the patient has been more confused at home - CTA head and neck shows 50-70% stenosis of vertebral artery on the right with no acute head CT findings - No signs of infection, no reported infectious symptoms - Concern for nutritional deficiencies given that patient has had a poor appetite, check thiamine, B12, folate - Check TSH - Continue to monitor

## 2022-10-24 NOTE — Assessment & Plan Note (Signed)
-   History is consistent with orthostatic hypotension - Check orthostatics - Monitor on telemetry - CTA head and neck does not give a clear etiology for syncope - When this has happened in the past it was related to blood pressure - MRI in the a.m. for completeness - Echo in the a.m. - Continue to monitor

## 2022-10-24 NOTE — H&P (Signed)
History and Physical    Patient: Benjamin [name] QVZ:[id number] DOB: [birth year] DOA: 10/23/2022 DOS: the patient was seen and examined on 10/24/2022 PCP: [facility]  Patient coming from: Home  Chief Complaint:  Chief Complaint  Patient presents with   Near Syncope   HPI: Benjamin [name] is a 72 y.o. male with medical history significant of hypertension, stroke, hyperlipidemia, sleep apnea, type 2 diabetes mellitus, and more presents the ED with a chief complaint of 2 syncopal events.  Patient reports his first event was near syncope.  He caught himself the last minute against the wall before he fell to the ground.  The second time he woke up on the floor.  Patient reports both times he was going from laying to standing.  He did make it about 10 steps the second time before he woke up on the floor.  He reports that he has no preceding symptoms including no dizziness, palpitations, dyspnea.  He reports the only thing out of the norm lately for him is that his neck has been hurting him.  On the left side of his neck he has a lipoma and he reports that it has not hurt in the past, but has been having sharp pain the past couple days.  Nothing is helped it.  He has tried creams and Tylenol.  He reports that his [employer] doctor told him it was a lipoma and its benign.  He has no pain on the right side of his neck. Patient reports that he had had syncopal episodes back in the summer.  At that time his blood pressure was low.  After that time he was having a GI bleed as well. Wife reported to the ED provider that last few weeks patient has had shuffling gait, been more confused, and been dropping things.  He does remember if he took his meds or not, and this is all very abnormal for him.  He does report that his legs have weakness.  He does not necessarily think that his upper extremities have been weak as well.  He reports that he does not have asymmetric weakness  Patient does not smoke, does not  drink, does not use illicit drugs.  He is vaccinated for COVID and flu.  Patient is full code. Review of Systems: As mentioned in the history of present illness. All other systems reviewed and are negative. Past Medical History:  Diagnosis Date   Arthritis    Essential hypertension    Gout    History of stroke    Right occipital May 2018   Hypertensive heart disease    LVH with grade 2 diastolic dysfunction   Mixed hyperlipidemia    Statin intolerance   Rotator cuff tear    Chronic pain   Sleep apnea    a. intolerant to CPAP   Stroke ([place]) 10/1998   TIA   Tubular adenoma    Type 2 diabetes mellitus ([place])    Past Surgical History:  Procedure Laterality Date   APPENDECTOMY     CARPAL TUNNEL RELEASE     COLONOSCOPY N/A 10/29/2013   Dr. [name]- sigmoid polyp status post cold snare removal. large sessile ascending colon polyp. debulked with saline assisted snare polypectomy . pancolonic diverticulosis. inadequate prep. tubular adenoma on bx   COLONOSCOPY N/A 02/22/2014   Dr. [name]: Saline-assisted snare polypectomy/biopsy for sprawling carpet polyp in the ascending colon which could not be completely removed. Status post biopsy (tubular adenoma), tattooed   COLONOSCOPY N/A  03/01/2014   IRS:WNIO polypectomy hemorrhage s/p bleeding   COLONOSCOPY N/A 06/08/2015   Procedure: COLONOSCOPY;  Surgeon: [name], MD;  [facility];  Service: Endoscopy;  Laterality: N/A;  0900   COLONOSCOPY WITH PROPOFOL N/A 11/14/2020   Procedure: COLONOSCOPY WITH PROPOFOL;  Surgeon: [name], MD;  [facility];  Service: Endoscopy;  Laterality: N/A;  8:15am   ESOPHAGOGASTRODUODENOSCOPY N/A 02/22/2014   Dr. [name]: Erosive reflux esophagitis. Schatzki ring status post dilation. Gastric and duodenal erosions with benign biopsies.   ESOPHAGOGASTRODUODENOSCOPY (EGD) WITH PROPOFOL N/A 06/07/2022   Procedure: ESOPHAGOGASTRODUODENOSCOPY (EGD) WITH PROPOFOL;  Surgeon: Eloise Harman, DO;  [facility];  Service: Endoscopy;  Laterality: N/A;   LAPAROSCOPIC RIGHT COLECTOMY N/A 04/16/2014   Procedure: LAPAROSCOPIC ASSISTED RIGHT COLECTOMY;  Surgeon: [name], MD;  Location: [facility];  Service: General;  Laterality: N/A;   MALONEY DILATION N/A 02/22/2014   Procedure: [name];  Surgeon: [name], MD;  [facility];  Service: Endoscopy;  Laterality: N/A;   POLYPECTOMY  06/07/2022   Procedure: POLYPECTOMY;  Surgeon: Eloise Harman, DO;  [facility];  Service: Endoscopy;;   SAVORY DILATION N/A 02/22/2014   Procedure: SAVORY DILATION;  Surgeon: [name], MD;  [facility];  Service: Endoscopy;  Laterality: N/A;   Social History:  reports that he quit smoking about 47 years ago. His smoking use included cigarettes. He started smoking about 61 years ago. He has never used smokeless tobacco. He reports that he does not drink alcohol and does not use drugs.  Allergies  Allergen Reactions   Niacin-Lovastatin Er Other (See Comments)    ADVICOR= Body aches   Statins     Body aches   Tamsulosin     Other reaction(s): Low blood pressure   Zocor [Simvastatin - High Dose] Other (See Comments)    Body aches.    Family History  Problem Relation Age of Onset   Hypertension Mother    Diabetes type II Mother    Colon cancer Neg Hx     Prior to Admission medications   Medication Sig Start Date End Date Taking? Authorizing Provider  acetaminophen (TYLENOL) 500 MG tablet Take 1,000 mg by mouth every 6 (six) hours as needed.   Yes [provider]  allopurinol (ZYLOPRIM) 100 MG tablet Take 100 mg by mouth daily.   Yes [provider]  apixaban (ELIQUIS) 5 MG TABS tablet Take 1 tablet (5 mg total) by mouth 2 (two) times daily. 06/08/22  Yes Emokpae, Courage, MD  cholecalciferol (VITAMIN D3) 25 MCG (1000 UNIT) tablet Take 1,000 Units by mouth daily.   Yes [provider]  cyclobenzaprine  (FLEXERIL) 10 MG tablet Take 2 tablets by mouth at bedtime. 10/15/22  Yes [provider]  empagliflozin (JARDIANCE) 25 MG TABS tablet Take 12.5 mg by mouth daily. Half tablet daily   Yes [provider]  ezetimibe (ZETIA) 10 MG tablet Take 10 mg by mouth daily. 10/24/21  Yes [provider]  fexofenadine (ALLEGRA) 60 MG tablet Take 60 mg by mouth daily. 05/16/22  Yes [provider]  guaifenesin (HUMIBID E) 400 MG TABS tablet Take 400 mg by mouth 2 (two) times daily as needed (cough and congestion). 08/07/21  Yes [provider]  insulin glargine (LANTUS) 100 UNIT/ML injection Inject 30 Units into the skin daily. IN THE MORNING   Yes [provider]  losartan (COZAAR)  50 MG tablet Take 1.5 tablets (75 mg total) by mouth daily. 08/15/22 08/10/23 Yes Satira Sark, MD  metoprolol tartrate (LOPRESSOR) 25 MG tablet Take 1 tablet (25 mg total) by mouth 2 (two) times daily. 06/11/22  Yes Satira Sark, MD  OVER THE COUNTER MEDICATION Pain relieving cream   Yes [provider]  pantoprazole (PROTONIX) 40 MG tablet Take 1 tablet (40 mg total) by mouth in the morning and at bedtime. 06/07/22  Yes Roxan Hockey, MD  Semaglutide, 1 MG/DOSE, 2 MG/1.5ML SOPN Inject 1 mg into the skin every Wednesday.    Yes [provider]  sodium chloride (OCEAN) 0.65 % SOLN nasal spray Place 1 spray into both nostrils as needed for congestion.   Yes [provider]  Tafluprost, PF, 0.0015 % SOLN Place 1 drop into both eyes every evening. 01/10/22  Yes [provider]  Timolol Maleate PF 0.5 % SOLN Place 1 drop into both eyes in the morning and at bedtime. 01/10/22  Yes [provider]    Physical Exam: Vitals:   10/23/22 1602 10/23/22 1912 10/23/22 2145 10/23/22 2158  BP:  (!) 155/91 (!) 175/125   Pulse:  70 68   Resp:  19 20   Temp:  98.5 F (36.9 C) 97.8 F (36.6 C)   TempSrc:  Oral Oral   SpO2:  96% 97%    Weight: 113.4 kg   111.8 kg  Height: '6\' 2"'$  (1.88 m)      1.  General: Patient lying supine in bed,  no acute distress   2. Psychiatric: Alert and oriented x 3, mood and behavior normal for situation, pleasant and cooperative with exam   3. Neurologic: Speech and language are normal, face is symmetric, moves all 4 extremities voluntarily, at baseline without acute deficits on limited exam   4. HEENMT:  Head is atraumatic, normocephalic, pupils reactive to light, neck is supple, trachea is midline, mucous membranes are moist, lipoma on left neck about the size of a $0.50 piece   5. Respiratory : Lungs are clear to auscultation bilaterally without wheezing, rhonchi, rales, no cyanosis, no increase in work of breathing or accessory muscle use   6. Cardiovascular : Heart rate normal, rhythm is irregularly irregular, no murmurs, rubs or gallops, no peripheral edema, peripheral pulses palpated   7. Gastrointestinal:  Abdomen is soft, nondistended, nontender to palpation bowel sounds active, no masses or organomegaly palpated   8. Skin:  Skin is warm, dry and intact without rashes, acute lesions, or ulcers on limited exam   9.Musculoskeletal:  No acute deformities or trauma, no asymmetry in tone, no peripheral edema, peripheral pulses palpated, no tenderness to palpation in the extremities  Data Reviewed: In the ED Temp 98.2-98.5, heart rate 70-77, respiratory rate 18-19, blood pressure 155/91-173/108, satting 96-97% Troponin 12, 13 No leukocytosis with a white blood cell count of 10.3, hemoglobin 15.8, platelets 197 Chemistry shows a slightly elevated creatinine at 1.5 but a baseline around 1.4 UA is not indicative of UTI EKG shows a heart rate of 63, A-fib, QTc 4 and 67 CTA head and neck shows 50-70% stenosis of the vertebral artery on the right but no acute intracranial findings Admission requested for syncope Assessment and Plan: * Syncope - History is consistent with  orthostatic hypotension - Check orthostatics - Monitor on telemetry - CTA head and neck does not give a clear etiology for syncope - When this has happened in the past it was related to blood  pressure - MRI in the a.m. for completeness - Echo in the a.m. - Continue to monitor  Generalized weakness - Check TSH - Check for nutritional deficiencies - PT eval and treat - No signs or reported infectious symptoms - Continue to monitor  Acute metabolic encephalopathy - Subtle - Patient alert and oriented x 3 at the time of my exam - Family reported the patient has been more confused at home - CTA head and neck shows 50-70% stenosis of vertebral artery on the right with no acute head CT findings - No signs of infection, no reported infectious symptoms - Concern for nutritional deficiencies given that patient has had a poor appetite, check thiamine, B12, folate - Check TSH - Continue to monitor  Atrial fibrillation, chronic (HCC) - Continue Eliquis and metoprolol  DM (diabetes mellitus) (HCC) - Sliding scale coverage  Hyperlipidemia - Continue Zetia  Essential hypertension, benign - Continue ARB, metoprolol      Advance Care Planning:   Code Status: Full Code  Consults: None at this time  Family Communication: No family at bedside  Severity of Illness: The appropriate patient status for this patient is OBSERVATION. Observation status is judged to be reasonable and necessary in order to provide the required intensity of service to ensure the patient's safety. The patient's presenting symptoms, physical exam findings, and initial radiographic and laboratory data in the context of their medical condition is felt to place them at decreased risk for further clinical deterioration. Furthermore, it is anticipated that the patient will be medically stable for discharge from the hospital within 2 midnights of admission.   Author: Rolla Plate, DO 10/24/2022 1:48 AM  For on  call review [email or web address].

## 2022-10-25 ENCOUNTER — Telehealth: Payer: Self-pay | Admitting: Cardiology

## 2022-10-25 NOTE — Telephone Encounter (Signed)
Pt c/o medication issue:  1. Name of Medication:   losartan (COZAAR) 50 MG tablet    2. How are you currently taking this medication (dosage and times per day)?    3. Are you having a reaction (difficulty breathing--STAT)? no  4. What is your medication issue? Wife called to say that the hospital lower his dosage. Calling to see of the dr is okay with that. Please advise

## 2022-10-25 NOTE — Discharge Summary (Signed)
Physician Discharge Summary   Patient: Benjamin [name] MRN: [id number] DOB: Sep 29, 1950  Admit date:     10/23/2022  Discharge date: 10/24/2022  Discharge Physician: Berle Mull  PCP: [facility], [facility]  Recommendations at discharge: Follow-up PCP in 1 week. Referred to vascular surgery for right vertebral artery stenosis. Referred to neurology for symptoms of tremors, stiffness and orthostatic hypotension-autonomic dysfunction with recurrent. Would allow him to tolerate higher blood pressure in supine position as the patient's blood pressure dropped significantly on standing.   Follow-up Information     Clinic, [place]. Schedule an appointment as soon as possible for a visit in 1 week(s).   Contact information: [place] [address] [phone]                Discharge Diagnoses: Principal Problem:   Syncope Active Problems:   Essential hypertension, benign   Hyperlipidemia   DM (diabetes mellitus) (HCC)   Atrial fibrillation, chronic (HCC)   Acute metabolic encephalopathy   Generalized weakness  Assessment and Plan  Recurrent syncope. Orthostatic hypotension Supine hypertension. Presents with recurrent episodes of passing out event. Episodes of sudden in onset without any prodrome. Blood pressure lying is 937 systolic and standing at 3 minutes is 169 systolic. Thankfully the patient is able to walk 200 feet after this drop in the blood pressure. Possibility of an autonomic dysfunction syndrome cannot be ruled out. At present we will provide abdominal binder to treat orthostatic drop. Patient has tried midodrine which was not tolerable and Florinef which caused blood pressure to go high further. Would also reduce the dose of the losartan to allow higher blood pressure in supine position.  Generalized weakness, tremors, stiffness Acute metabolic encephalopathy. Wife reports patient had some episode of confusion  although the time of my evaluation patient is oriented and well without any focal deficit. Patient does have some stiffness/rigidity in upper extremity. Wife reports patient having difficulty holding things with his grip and has been dropping things more frequently lately due to 'shakes' Will refer to neurology for further workup. PT OT was consulted, patient did well. MRI brain negative for any acute stroke.  Left-sided neck pain. Patient's primary presentation to the hospital secondary to a sudden onset of right-sided neck pain followed by syncopal event. Patient does have a chronic lump on the left side of the neck which is unchanged in nature and characteristic. Currently CT of the head and neck shows no evidence of acute abnormality to explain this pain and patient does not have any pain right now. Monitor.  Right vertebral artery stenosis. Referred to vascular surgery.  Type 2 diabetes mellitus, controlled with PVD with long-term insulin use. For now would recommend to continue current use of Ozempic, Lantus and Jardiance. Blood sugar appears to be well-controlled therefore no indication that hypoglycemia is responsible for patient's presentation.  Chronic A-fib. On Eliquis and metoprolol which I will continue.  HLD. Continue Zetia. Patient also on Repatha.  Hypokalemia. Treated with supplements.  Relative B12 deficiency. B12 level 380.  Folic acid 8.2. Will provide supplements and monitor for response. Patient does have some abnormal proprioception.  Relative B12 deficiency. B12 level 380.  Folic acid 8.2. Will provide supplements and monitor for response. Patient does have some abnormal proprioception.  Obesity Body mass index is 31.65 kg/m.  Placing the pt at higher risk of poor outcomes.  Recommended not to drive until cleared by PCP.  Consultants:  None  Procedures performed:  Echocardiogram  DISCHARGE  MEDICATION: Allergies as of 10/24/2022        Reactions   Niacin-lovastatin Er Other (See Comments)   ADVICOR= Body aches   Statins    Body aches   Tamsulosin    Other reaction(s): Low blood pressure   Zocor [simvastatin - High Dose] Other (See Comments)   Body aches.        Medication List     TAKE these medications    Abdominal Binder/Elastic 2XL Misc 1 each by Does not apply route as directed.   acetaminophen 500 MG tablet Commonly known as: TYLENOL Take 1,000 mg by mouth every 6 (six) hours as needed.   allopurinol 100 MG tablet Commonly known as: ZYLOPRIM Take 100 mg by mouth daily.   apixaban 5 MG Tabs tablet Commonly known as: ELIQUIS Take 1 tablet (5 mg total) by mouth 2 (two) times daily.   cholecalciferol 25 MCG (1000 UNIT) tablet Commonly known as: VITAMIN D3 Take 1,000 Units by mouth daily.   cyanocobalamin 1000 MCG tablet Commonly known as: VITAMIN B12 Take 1 tablet (1,000 mcg total) by mouth daily.   cyclobenzaprine 10 MG tablet Commonly known as: FLEXERIL Take 2 tablets by mouth at bedtime.   empagliflozin 25 MG Tabs tablet Commonly known as: JARDIANCE Take 12.5 mg by mouth daily. Half tablet daily   ezetimibe 10 MG tablet Commonly known as: ZETIA Take 10 mg by mouth daily.   fexofenadine 60 MG tablet Commonly known as: ALLEGRA Take 60 mg by mouth daily.   folic acid 1 MG tablet Commonly known as: FOLVITE Take 1 tablet (1 mg total) by mouth daily.   guaifenesin 400 MG Tabs tablet Commonly known as: HUMIBID E Take 400 mg by mouth 2 (two) times daily as needed (cough and congestion).   insulin glargine 100 UNIT/ML injection Commonly known as: LANTUS Inject 30 Units into the skin daily. IN THE MORNING   losartan 50 MG tablet Commonly known as: COZAAR Take 1 tablet (50 mg total) by mouth daily. What changed: how much to take   metoprolol tartrate 25 MG tablet Commonly known as: LOPRESSOR Take 1 tablet (25 mg total) by mouth 2 (two) times daily.   OVER THE COUNTER  MEDICATION Pain relieving cream   pantoprazole 40 MG tablet Commonly known as: PROTONIX Take 1 tablet (40 mg total) by mouth in the morning and at bedtime.   Semaglutide (1 MG/DOSE) 2 MG/1.5ML Sopn Inject 1 mg into the skin every Wednesday.   sodium chloride 0.65 % Soln nasal spray Commonly known as: OCEAN Place 1 spray into both nostrils as needed for congestion.   Tafluprost (PF) 0.0015 % Soln Place 1 drop into both eyes every evening.   Timolol Maleate PF 0.5 % Soln Place 1 drop into both eyes in the morning and at bedtime.       Disposition: Home Diet recommendation: Cardiac diet  Discharge Exam: Vitals:   10/24/22 0437 10/24/22 0439 10/24/22 0442 10/24/22 1350  BP: (!) 164/126 (!) 186/116 111/76 (!) 183/107  Pulse: (!) 42 (!) 58 78 (!) 56  Resp: '19 19 20   '$ Temp: 98.4 F (36.9 C) 98.6 F (37 C) 98.5 F (36.9 C) 97.6 F (36.4 C)  TempSrc: Oral Oral Oral Oral  SpO2: 94% 93% 96% 97%  Weight:      Height:       General: Appear in no distress; no visible Abnormal Neck Mass Or lumps, Conjunctiva normal Cardiovascular: S1 and S2 Present, no Murmur, Respiratory: good respiratory effort,  Bilateral Air entry present and CTA, no Crackles, no wheezes Abdomen: Bowel Sound present, Non tender  Extremities: no Pedal edema Neurology: alert and oriented to time, place, and person  Filed Weights   10/23/22 1602 10/23/22 2158  Weight: 113.4 kg 111.8 kg   Condition at discharge: stable  The results of significant diagnostics from this hospitalization (including imaging, microbiology, ancillary and laboratory) are listed below for reference.   Imaging Studies: ECHOCARDIOGRAM COMPLETE  Result Date: 10/24/2022    ECHOCARDIOGRAM REPORT   Patient Name:   KODEY XUE Date of Exam: 10/24/2022 Medical Rec #:  [id number]      Height:       74.0 in Accession #:    [id number]     Weight:       246.5 lb Date of Birth:  1951/02/06     BSA:          2.375 m Patient Age:    72 years        BP:           111/76 mmHg Patient Gender: M              HR:           68 bpm. Exam Location:  [facility] Procedure: 2D Echo, Color Doppler, Cardiac Doppler and Intracardiac            Opacification Agent Indications:    Atrial Fibrillation I48.91  History:        Patient has prior history of Echocardiogram examinations, most                 recent 04/12/2022. Stroke, Arrythmias:Atrial Fibrillation,                 Signs/Symptoms:Syncope; Risk Factors:Hypertension, Diabetes,                 Dyslipidemia, Former Smoker and Sleep Apnea.  Sonographer:    Greer Pickerel Referring Phys: [id number] ASIA B [place]  Sonographer Comments: Image acquisition challenging due to patient body habitus and Image acquisition challenging due to respiratory motion. IMPRESSIONS  1. Left ventricular ejection fraction, by estimation, is 50 to 55%. The left ventricle has low normal function. The left ventricle has no regional wall motion abnormalities. There is mild left ventricular hypertrophy. Left ventricular diastolic parameters are indeterminate.  2. Right ventricular systolic function is normal. The right ventricular size is normal. There is normal pulmonary artery systolic pressure.  3. Left atrial size was moderately dilated.  4. The mitral valve is normal in structure. Mild mitral valve regurgitation. No evidence of mitral stenosis.  5. The aortic valve is tricuspid. There is severe calcifcation of the aortic valve. There is severe thickening of the aortic valve. Aortic valve regurgitation is not visualized. No aortic stenosis is present.  6. The inferior vena cava is dilated in size with <50% respiratory variability, suggesting right atrial pressure of 15 mmHg. FINDINGS  Left Ventricle: Left ventricular ejection fraction, by estimation, is 50 to 55%. The left ventricle has low normal function. The left ventricle has no regional wall motion abnormalities. The left ventricular internal cavity size was normal in size. There is mild  left ventricular hypertrophy. Left ventricular diastolic parameters are indeterminate. Right Ventricle: The right ventricular size is normal. Right vetricular wall thickness was not well visualized. Right ventricular systolic function is normal. There is normal pulmonary artery systolic pressure. The tricuspid regurgitant velocity is 2.10 m/s, and with an assumed right atrial pressure of  3 mmHg, the estimated right ventricular systolic pressure is 76.7 mmHg. Left Atrium: Left atrial size was moderately dilated. Right Atrium: Right atrial size was normal in size. Pericardium: There is no evidence of pericardial effusion. Mitral Valve: The mitral valve is normal in structure. Mild mitral valve regurgitation. No evidence of mitral valve stenosis. Tricuspid Valve: The tricuspid valve is normal in structure. Tricuspid valve regurgitation is trivial. No evidence of tricuspid stenosis. Aortic Valve: The aortic valve is tricuspid. There is severe calcifcation of the aortic valve. There is severe thickening of the aortic valve. There is severe aortic valve annular calcification. Aortic valve regurgitation is not visualized. No aortic stenosis is present. Aortic valve mean gradient measures 6.2 mmHg. Aortic valve peak gradient measures 12.2 mmHg. Aortic valve area, by VTI measures 1.91 cm. Pulmonic Valve: The pulmonic valve was not well visualized. Pulmonic valve regurgitation is not visualized. No evidence of pulmonic stenosis. Aorta: The aortic root and ascending aorta are structurally normal, with no evidence of dilitation. Venous: The inferior vena cava is dilated in size with less than 50% respiratory variability, suggesting right atrial pressure of 15 mmHg. IAS/Shunts: No atrial level shunt detected by color flow Doppler.  LEFT VENTRICLE PLAX 2D LVIDd:         5.10 cm   Diastology LVIDs:         3.90 cm   LV e' medial:    6.42 cm/s LV PW:         1.20 cm   LV E/e' medial:  17.1 LV IVS:        1.10 cm   LV e' lateral:    5.00 cm/s LVOT diam:     2.20 cm   LV E/e' lateral: 22.0 LV SV:         63 LV SV Index:   26 LVOT Area:     3.80 cm  RIGHT VENTRICLE RV S prime:     9.90 cm/s TAPSE (M-mode): 1.5 cm LEFT ATRIUM              Index        RIGHT ATRIUM           Index LA diam:        4.10 cm  1.73 cm/m   RA Area:     25.40 cm LA Vol (A2C):   101.0 ml 42.52 ml/m  RA Volume:   83.60 ml  35.20 ml/m LA Vol (A4C):   115.0 ml 48.41 ml/m LA Biplane Vol: 112.0 ml 47.15 ml/m  AORTIC VALVE                     PULMONIC VALVE AV Area (Vmax):    1.91 cm      PR End Diast Vel: 3.02 msec AV Area (Vmean):   1.74 cm AV Area (VTI):     1.91 cm AV Vmax:           174.95 cm/s AV Vmean:          116.522 cm/s AV VTI:            0.328 m AV Peak Grad:      12.2 mmHg AV Mean Grad:      6.2 mmHg LVOT Vmax:         87.90 cm/s LVOT Vmean:        53.300 cm/s LVOT VTI:          0.165 m LVOT/AV VTI ratio: 0.50  AORTA Ao Root  diam: 3.70 cm Ao Asc diam:  3.40 cm MITRAL VALVE                TRICUSPID VALVE MV Area (PHT): 4.89 cm     TR Peak grad:   17.6 mmHg MV Decel Time: 155 msec     TR Vmax:        210.00 cm/s MV E velocity: 110.00 cm/s MV A velocity: 38.80 cm/s   SHUNTS MV E/A ratio:  2.84         Systemic VTI:  0.16 m                             Systemic Diam: 2.20 cm Carlyle Dolly MD Electronically signed by Carlyle Dolly MD Signature Date/Time: 10/24/2022/12:51:20 PM    Final    MR BRAIN WO CONTRAST  Result Date: 10/24/2022 CLINICAL DATA:  Mental status change, unknown cause. EXAM: MRI HEAD WITHOUT CONTRAST TECHNIQUE: Multiplanar, multiecho pulse sequences of the brain and surrounding structures were obtained without intravenous contrast. COMPARISON:  Head CT October 23, 2022. MRI of the brain Jan 22, 2017. FINDINGS: Brain: No acute infarction, hemorrhage, hydrocephalus, extra-axial collection or mass lesion. Area of encephalomalacia and gliosis in right occipital lobe. Prominent confluent T2 hyperintensity of the white matter of the cerebral  hemispheres, progressed since prior MRI. Mild parenchymal volume loss. Vascular: Normal flow voids. Skull and upper cervical spine: Normal marrow signal. Sinuses/Orbits: Trace mucosal thickening in the bilateral maxillary sinuses. Bilateral lens surgery. Other: None. IMPRESSION: 1. No acute intracranial abnormality. 2. Advanced chronic microvascular ischemic changes of the white matter, progressed since prior MRI. 3. Remote right PCA territory infarct. Electronically Signed   By: Pedro Earls M.D.   On: 10/24/2022 10:24   CT ANGIO HEAD NECK W WO CM  Result Date: 10/23/2022 CLINICAL DATA:  Syncope/presyncope. EXAM: CT ANGIOGRAPHY HEAD AND NECK TECHNIQUE: Multidetector CT imaging of the head and neck was performed using the standard protocol during bolus administration of intravenous contrast. Multiplanar CT image reconstructions and MIPs were obtained to evaluate the vascular anatomy. Carotid stenosis measurements (when applicable) are obtained utilizing NASCET criteria, using the distal internal carotid diameter as the denominator. RADIATION DOSE REDUCTION: This exam was performed according to the departmental dose-optimization program which includes automated exposure control, adjustment of the mA and/or kV according to patient size and/or use of iterative reconstruction technique. CONTRAST:  37m OMNIPAQUE IOHEXOL 350 MG/ML SOLN COMPARISON:  01/22/2017 FINDINGS: CT HEAD FINDINGS Brain: Brainstem and cerebellum appear normal. There is old infarction in the right occipital lobe. There chronic small-vessel ischemic changes affecting the cerebral hemispheric white matter. No sign of acute infarction, mass lesion, hemorrhage, hydrocephalus or extra-axial collection. Vascular: No abnormal vascular finding. Skull: Normal Sinuses/Orbits: Clear/normal Other: None Review of the MIP images confirms the above findings CTA NECK FINDINGS Aortic arch: Branching pattern is normal without origin stenosis. Right  carotid system: Common carotid artery is patent to the bifurcation. Soft plaque at the carotid bifurcation and ICA bulb. There is no narrowing of the vessel beyond the diameter of the more distal cervical ICA, therefore there is no stenosis. Left carotid system: Common carotid artery patent to the bifurcation. Soft and calcified plaque at the carotid bifurcation and ICA bulb. Minimal diameter in the bulb is the same as the more distal cervical ICA, therefore there is no stenosis. Vertebral arteries: Right vertebral artery origin shows calcified plaque with 50-70% stenosis. Left vertebral artery  origin is widely patent. Beyond the origins, both vessels appear normal through the cervical region to the foramen magnum. Skeleton: Ordinary spondylosis.  Chronic fusion at C4-5. Other neck: No mass or lymphadenopathy. Upper chest: Lung apices are clear. Review of the MIP images confirms the above findings CTA HEAD FINDINGS Anterior circulation: Both internal carotid arteries are patent through the skull base and siphon regions. Mild siphon atherosclerotic calcification but no stenosis. The anterior and middle cerebral vessels patent. No large vessel occlusion or proximal stenosis. No aneurysm or vascular malformation. Posterior circulation: Both vertebral arteries are patent through the foramen magnum to the basilar artery. No basilar stenosis. Posterior circulation branch vessels are patent normal. Venous sinuses: Patent and normal. Anatomic variants: None significant. Review of the MIP images confirms the above findings IMPRESSION: 1. No acute head CT finding. Old right occipital infarction. Chronic small-vessel ischemic change of the white matter. 2. No intracranial large vessel occlusion or proximal stenosis. 3. Atherosclerotic disease at both carotid bifurcations but without stenosis. 4. 50-70% stenosis of the right vertebral artery origin. Left vertebral artery widely patent. Electronically Signed   By: Nelson Chimes  M.D.   On: 10/23/2022 18:19    Microbiology: Results for orders placed or performed during the hospital encounter of 11/10/20  SARS CORONAVIRUS 2 (TAT 6-24 HRS) Nasopharyngeal Nasopharyngeal Swab     Status: None   Collection Time: 11/10/20  1:22 PM   Specimen: Nasopharyngeal Swab  Result Value Ref Range Status   SARS Coronavirus 2 NEGATIVE NEGATIVE Final    Comment: (NOTE) SARS-CoV-2 target nucleic acids are NOT DETECTED.  The SARS-CoV-2 RNA is generally detectable in upper and lower respiratory specimens during the acute phase of infection. Negative results do not preclude SARS-CoV-2 infection, do not rule out co-infections with other pathogens, and should not be used as the sole basis for treatment or other patient management decisions. Negative results must be combined with clinical observations, patient history, and epidemiological information. The expected result is Negative.  Fact Sheet for Patients: SugarRoll.be  Fact Sheet for Healthcare Providers: https://www.woods-mathews.com/  This test is not yet approved or cleared by the [place] FDA and  has been authorized for detection and/or diagnosis of SARS-CoV-2 by FDA under an Emergency Use Authorization (EUA). This EUA will remain  in effect (meaning this test can be used) for the duration of the COVID-19 declaration under Se ction 564(b)(1) of the Act, 21 U.S.C. section 360bbb-3(b)(1), unless the authorization is terminated or revoked sooner.  Performed at [facility], [place] [address] [place], [place] [address]    Labs: CBC: Recent Labs  Lab 10/23/22 1621 10/24/22 0517  WBC 10.3 11.8*  NEUTROABS  --  6.7  HGB 15.8 15.8  HCT 49.3 49.1  MCV 87.4 87.7  PLT 197 751   Basic Metabolic Panel: Recent Labs  Lab 10/23/22 1621 10/24/22 0517  NA 135 137  K 3.6 3.0*  CL 98 100  CO2 25 26  GLUCOSE 163* 101*  BUN 23 20  CREATININE 1.50* 1.25*  CALCIUM 8.3*  8.5*  MG  --  2.2   Liver Function Tests: Recent Labs  Lab 10/24/22 0517  AST 18  ALT 17  ALKPHOS 107  BILITOT 0.8  PROT 7.4  ALBUMIN 3.4*   CBG: Recent Labs  Lab 10/23/22 2208 10/24/22 0741 10/24/22 1136  GLUCAP 108* 92 128*    Discharge time spent: greater than 30 minutes.  Signed: Berle Mull, MD [facility] 10/24/2022

## 2022-10-25 NOTE — Telephone Encounter (Signed)
Spoke to pt's wife who stated that hospitalist decreased Losartan to 50 mg daily. Wife stated that pt's bp is higher when he lays down and lower when he stands up. Wife would like to know if lower dose is acceptable.   Please advise.

## 2022-10-26 NOTE — Telephone Encounter (Signed)
Patients spouse notified and verbalized understanding.

## 2022-10-29 LAB — VITAMIN B1: Vitamin B1 (Thiamine): 165.8 nmol/L (ref 66.5–200.0)

## 2022-11-01 NOTE — Progress Notes (Signed)
Cardiology Office Note  Date: 11/06/2022   ID: Benjamin, [name] 02/18/1951, MRN [id number]  PCP:  [facility]  Cardiologist:  Rozann Lesches, MD Electrophysiologist:  None   Chief Complaint  Patient presents with   Cardiac follow-up    History of Present Illness: Benjamin [name] is a 72 y.o. male last seen in November 2023.  He is here for a follow-up visit.  Chart reviewed, he was hospitalized in early February for evaluation of syncope and also suspected metabolic encephalopathy.  He had no evidence of acute infarct by brain MRI (although chronic progressive ischemic changes and evidence of prior infarct), was found to be orthostatic resulting in decrease in his losartan.  He had also experienced right-sided neck pain, CTA of head and neck showed no acute abnormalities.  I reviewed his ECG and echocardiogram that were also obtained.  CT angiography of the head and neck did show a 50 to 70% right vertebral artery stenosis, no intracranial large vessel occlusion or proximal stenoses.  No obvious stenosis within the bilateral ICA system.  It looks like he was referred to see Dr. Donnetta Hutching by the hospitalist team.  Also referral pending to neurology.  Blood pressure has been consistently high at home, they report systolics in the Q000111Q to Q000111Q to 100 range.  He has had no dizziness or syncope blood pressures recorded.  As noted previously he did not tolerate midodrine and on Florinef blood pressure was too high overall.  Past Medical History:  Diagnosis Date   Arthritis    Essential hypertension    Gout    History of stroke    Right occipital May 2018   Hypertensive heart disease    LVH with grade 2 diastolic dysfunction   Mixed hyperlipidemia    Statin intolerance   Rotator cuff tear    Chronic pain   Sleep apnea    a. intolerant to CPAP   Stroke ([place]) 10/1998   TIA   Tubular adenoma    Type 2 diabetes mellitus (HCC)     Current Outpatient Medications   Medication Sig Dispense Refill   acetaminophen (TYLENOL) 500 MG tablet Take 1,000 mg by mouth every 6 (six) hours as needed.     Alirocumab (PRALUENT [place]) Inject into the skin.     allopurinol (ZYLOPRIM) 100 MG tablet Take 100 mg by mouth daily.     apixaban (ELIQUIS) 5 MG TABS tablet Take 1 tablet (5 mg total) by mouth 2 (two) times daily. 60 tablet 1   cholecalciferol (VITAMIN D3) 25 MCG (1000 UNIT) tablet Take 1,000 Units by mouth daily.     cyclobenzaprine (FLEXERIL) 10 MG tablet Take 2 tablets by mouth at bedtime.     Elastic Bandages & Supports (ABDOMINAL BINDER/ELASTIC 2XL) MISC 1 each by Does not apply route as directed. 1 each 0   empagliflozin (JARDIANCE) 25 MG TABS tablet Take 12.5 mg by mouth daily. Half tablet daily     ezetimibe (ZETIA) 10 MG tablet Take 10 mg by mouth daily.     fexofenadine (ALLEGRA) 60 MG tablet Take 60 mg by mouth daily.     folic acid (FOLVITE) 1 MG tablet Take 1 tablet (1 mg total) by mouth daily. 30 tablet 0   guaifenesin (HUMIBID E) 400 MG TABS tablet Take 400 mg by mouth 2 (two) times daily as needed (cough and congestion).     insulin glargine (LANTUS) 100 UNIT/ML injection Inject 30 Units into the skin daily. IN THE  MORNING     losartan (COZAAR) 50 MG tablet Take 50 mg (1 tablet) in the am ,take 25 mg (1/2 tablet) at bedtime 135 tablet 3   metoprolol tartrate (LOPRESSOR) 25 MG tablet Take 1 tablet (25 mg total) by mouth 2 (two) times daily. 180 tablet 3   OVER THE COUNTER MEDICATION Pain relieving cream     pantoprazole (PROTONIX) 40 MG tablet Take 1 tablet (40 mg total) by mouth in the morning and at bedtime. 60 tablet 4   Semaglutide, 1 MG/DOSE, 2 MG/1.5ML SOPN Inject 1 mg into the skin every Wednesday.      sodium chloride (OCEAN) 0.65 % SOLN nasal spray Place 1 spray into both nostrils as needed for congestion.     Tafluprost, PF, 0.0015 % SOLN Place 1 drop into both eyes every evening.     Timolol Maleate PF 0.5 % SOLN Place 1 drop into both eyes  in the morning and at bedtime.     No current facility-administered medications for this visit.   Allergies:  Niacin-lovastatin er, Statins, Tamsulosin, and Zocor [simvastatin - high dose]   ROS: Intermittent weakness, also intermittent confusion per wife.  Physical Exam: VS:  BP (!) 140/86   Pulse 66   Ht 6' 2"$  (1.88 m)   Wt 247 lb 9.6 oz (112.3 kg)   SpO2 96%   BMI 31.79 kg/m , BMI Body mass index is 31.79 kg/m.  Wt Readings from Last 3 Encounters:  11/06/22 247 lb 9.6 oz (112.3 kg)  10/23/22 246 lb 7.6 oz (111.8 kg)  08/14/22 255 lb (115.7 kg)    General: Patient appears comfortable at rest. HEENT: Conjunctiva and lids normal. Neck: Supple, no elevated JVP or carotid bruits. Lungs: Clear to auscultation, nonlabored breathing at rest. Cardiac: Irregularly irregular, 1/6 systolic murmur, no gallop. Extremities: No pitting edema.  ECG:  An ECG dated 10/23/2022 was personally reviewed today and demonstrated:  Coarse atrial fibrillation with right bundle branch block and left posterior fascicular block.  Recent Labwork: 10/24/2022: ALT 17; AST 18; BUN 20; Creatinine, Ser 1.25; Hemoglobin 15.8; Magnesium 2.2; Platelets 211; Potassium 3.0; Sodium 137; TSH 4.901   Other Studies Reviewed Today:  Echocardiogram 10/24/2022:  1. Left ventricular ejection fraction, by estimation, is 50 to 55%. The  left ventricle has low normal function. The left ventricle has no regional  wall motion abnormalities. There is mild left ventricular hypertrophy.  Left ventricular diastolic  parameters are indeterminate.   2. Right ventricular systolic function is normal. The right ventricular  size is normal. There is normal pulmonary artery systolic pressure.   3. Left atrial size was moderately dilated.   4. The mitral valve is normal in structure. Mild mitral valve  regurgitation. No evidence of mitral stenosis.   5. The aortic valve is tricuspid. There is severe calcifcation of the  aortic valve.  There is severe thickening of the aortic valve. Aortic valve  regurgitation is not visualized. No aortic stenosis is present.   6. The inferior vena cava is dilated in size with <50% respiratory  variability, suggesting right atrial pressure of 15 mmHg.   Assessment and Plan:  1.  Permanent atrial fibrillation with plan to continue heart rate control and anticoagulation.  CHA2DS2-VASc score is 6.  He is on Eliquis for stroke prophylaxis and remains on Lopressor 25 mg twice daily.  Heart rate is well-controlled today.  Recent echocardiogram shows LVEF 50 to 55% with moderate left atrial enlargement.  2.  Essential hypertension complicated  by intermittent orthostasis and possibly some degree of autonomic dysfunction.  He had an episode of syncope earlier in February that occurred after feeling of intense pain in his neck, question vagal component as he has had no subsequent symptoms.  Blood pressures have actually been high since reducing losartan during hospital stay.  Recommended that he continue losartan 50 mg the morning with additional 25 mg evening dose.  3.  Mixed hyperlipidemia with statin intolerance, has done well on Praluent, following lipids through the [facility] system.  Medication Adjustments/Labs and Tests Ordered: Current medicines are reviewed at length with the patient today.  Concerns regarding medicines are outlined above.   Tests Ordered: No orders of the defined types were placed in this encounter.   Medication Changes: Meds ordered this encounter  Medications   losartan (COZAAR) 50 MG tablet    Sig: Take 50 mg (1 tablet) in the am ,take 25 mg (1/2 tablet) at bedtime    Dispense:  135 tablet    Refill:  3    Disposition:  Follow up  6 weeks.  Signed, Satira Sark, MD, [facility] 11/06/2022 8:47 AM    [facility]. [address], [place], [place] [address] Phone: [phone]; Fax: [phone]

## 2022-11-06 ENCOUNTER — Encounter: Payer: Self-pay | Admitting: Cardiology

## 2022-11-06 ENCOUNTER — Ambulatory Visit: Payer: No Typology Code available for payment source | Attending: Cardiology | Admitting: Cardiology

## 2022-11-06 VITALS — BP 140/86 | HR 66 | Ht 74.0 in | Wt 247.6 lb

## 2022-11-06 DIAGNOSIS — I951 Orthostatic hypotension: Secondary | ICD-10-CM

## 2022-11-06 DIAGNOSIS — I4821 Permanent atrial fibrillation: Secondary | ICD-10-CM | POA: Diagnosis not present

## 2022-11-06 DIAGNOSIS — I10 Essential (primary) hypertension: Secondary | ICD-10-CM | POA: Diagnosis not present

## 2022-11-06 MED ORDER — LOSARTAN POTASSIUM 50 MG PO TABS: ORAL_TABLET | ORAL | 3 refills | Status: DC

## 2022-11-06 NOTE — Patient Instructions (Signed)
Medication Instructions:  Take Losartan 50 mg am and take 25 mg (1/2 tablet) at bedtime  Labwork: None today  Testing/Procedures: None today  Follow-Up: 6 weeks  Any Other Special Instructions Will Be Listed Below (If Applicable).  If you need a refill on your cardiac medications before your next appointment, please call your pharmacy.

## 2022-11-07 ENCOUNTER — Encounter: Payer: Self-pay | Admitting: Vascular Surgery

## 2022-11-07 ENCOUNTER — Ambulatory Visit: Payer: No Typology Code available for payment source | Admitting: Vascular Surgery

## 2022-11-07 VITALS — BP 167/104 | HR 61 | Temp 97.9°F | Ht 74.0 in | Wt 247.6 lb

## 2022-11-07 DIAGNOSIS — I6501 Occlusion and stenosis of right vertebral artery: Secondary | ICD-10-CM

## 2022-11-07 NOTE — Progress Notes (Signed)
[facility] [place]  Patient name: Benjamin [name] MRN: [id number] DOB: 06-Apr-1951 Sex: male  REASON FOR CONSULT: Gust recent CT evidence of right vertebral artery stenosis  HPI: Benjamin [name] is a 72 y.o. male, who is here today for discussion of his recent CT angiogram.  He is here today with his wife.  He has a complicated past history.  He does have a remote history of right occipital stroke in May 2018.  Carotid duplex at that time showed no evidence of significant carotid stenosis and antegrade vertebral flow bilaterally.  Over the past summer through September he was having difficulty with orthostatic hypotension with low blood pressure.  This has been better.  He has had a 40-year history of chronic neck pain and spasms.  In Benjamin [name] February of this year he was having severe neck pain and the following morning passed out.  His wife reports she heard him hit the floor and had a difficult time arousing him.  EMS was called but he did not want to go to the hospital.  He did go to the hospital 1 to 2 days following this and underwent evaluation to include CT angiogram of his neck.  This did show 50 to 70% stenosis at the origin of his right vertebral artery.  He denies chronic dizziness.  He and his wife report that he has had some persistent difficulty with generalized weakness and decreased appetite since this event.  Also reports some confusion.  He does have an appointment with Benjamin [name] on 11/26/2022  Past Medical History:  Diagnosis Date   Arthritis    Essential hypertension    Gout    History of stroke    Right occipital May 2018   Hypertensive heart disease    LVH with grade 2 diastolic dysfunction   Mixed hyperlipidemia    Statin intolerance   Rotator cuff tear    Chronic pain   Sleep apnea    a. intolerant to CPAP   Stroke ([place]) 10/1998   TIA   Tubular adenoma    Type 2 diabetes mellitus ([place])     Family  History  Problem Relation Age of Onset   Hypertension Mother    Diabetes type II Mother    Colon cancer Neg Hx     SOCIAL HISTORY: Social History   Socioeconomic History   Marital status: Married    Spouse name: Not on file   Number of children: 2   Years of education: Not on file   Highest education level: Not on file  Occupational History   Occupation: Programmer, systems: [employer]   Tobacco Use   Smoking status: Former    Packs/day: 1.00    Years: 20.00    Total pack years: 20.00    Types: Cigarettes    Start date: 09/17/1961    Quit date: 09/18/1975    Years since quitting: 47.1   Smokeless tobacco: Never   Tobacco comments:    quit 35 years ago  Vaping Use   Vaping Use: Never used  Substance and Sexual Activity   Alcohol use: No    Comment: glass of wine every so often   Drug use: No   Sexual activity: Yes  Other Topics Concern   Not on file  Social History Narrative   Not on file   Social Determinants of Health   Financial Resource Strain: Not on file  Food Insecurity: No Food Insecurity (10/23/2022)  Hunger Vital Sign    Worried About Running Out of Food in the Last Year: Never true    Ran Out of Food in the Last Year: Never true  Transportation Needs: No Transportation Needs (10/23/2022)   PRAPARE - Hydrologist (Medical): No    Lack of Transportation (Non-Medical): No  Physical Activity: Not on file  Stress: Not on file  Social Connections: Not on file  Intimate Partner Violence: Not At Risk (10/23/2022)   Humiliation, Afraid, Rape, and Kick questionnaire    Fear of Current or Ex-Partner: No    Emotionally Abused: No    Physically Abused: No    Sexually Abused: No    Allergies  Allergen Reactions   Niacin-Lovastatin Er Other (See Comments)    ADVICOR= Body aches   Statins     Body aches   Tamsulosin     Other reaction(s): Low blood pressure   Zocor [Simvastatin - High Dose] Other (See Comments)    Body aches.     Current Outpatient Medications  Medication Sig Dispense Refill   acetaminophen (TYLENOL) 500 MG tablet Take 1,000 mg by mouth every 6 (six) hours as needed.     Alirocumab (PRALUENT [place]) Inject into the skin.     allopurinol (ZYLOPRIM) 100 MG tablet Take 100 mg by mouth daily.     apixaban (ELIQUIS) 5 MG TABS tablet Take 1 tablet (5 mg total) by mouth 2 (two) times daily. 60 tablet 1   cholecalciferol (VITAMIN D3) 25 MCG (1000 UNIT) tablet Take 1,000 Units by mouth daily.     cyclobenzaprine (FLEXERIL) 10 MG tablet Take 2 tablets by mouth at bedtime.     Elastic Bandages & Supports (ABDOMINAL BINDER/ELASTIC 2XL) MISC 1 each by Does not apply route as directed. 1 each 0   empagliflozin (JARDIANCE) 25 MG TABS tablet Take 12.5 mg by mouth daily. Half tablet daily     ezetimibe (ZETIA) 10 MG tablet Take 10 mg by mouth daily.     fexofenadine (ALLEGRA) 60 MG tablet Take 60 mg by mouth daily.     folic acid (FOLVITE) 1 MG tablet Take 1 tablet (1 mg total) by mouth daily. 30 tablet 0   guaifenesin (HUMIBID E) 400 MG TABS tablet Take 400 mg by mouth 2 (two) times daily as needed (cough and congestion).     insulin glargine (LANTUS) 100 UNIT/ML injection Inject 30 Units into the skin daily. IN THE MORNING     losartan (COZAAR) 50 MG tablet Take 50 mg (1 tablet) in the am ,take 25 mg (1/2 tablet) at bedtime 135 tablet 3   metoprolol tartrate (LOPRESSOR) 25 MG tablet Take 1 tablet (25 mg total) by mouth 2 (two) times daily. 180 tablet 3   OVER THE COUNTER MEDICATION Pain relieving cream     pantoprazole (PROTONIX) 40 MG tablet Take 1 tablet (40 mg total) by mouth in the morning and at bedtime. 60 tablet 4   Semaglutide, 1 MG/DOSE, 2 MG/1.5ML SOPN Inject 1 mg into the skin every Wednesday.      sodium chloride (OCEAN) 0.65 % SOLN nasal spray Place 1 spray into both nostrils as needed for congestion.     Tafluprost, PF, 0.0015 % SOLN Place 1 drop into both eyes every evening.     Timolol Maleate PF 0.5  % SOLN Place 1 drop into both eyes in the morning and at bedtime.     No current facility-administered medications for this visit.  REVIEW OF SYSTEMS:  [X]$  denotes positive finding, [ ]$  denotes negative finding Cardiac  Comments:  Chest pain or chest pressure:    Shortness of breath upon exertion:    Short of breath when lying flat:    Irregular heart rhythm: x       Vascular    Pain in calf, thigh, or hip brought on by ambulation: x   Pain in feet at night that wakes you up from your sleep:     Blood clot in your veins: x   Leg swelling:         Pulmonary    Oxygen at home:    Productive cough:     Wheezing:         [name]    Sudden weakness in arms or legs:     Sudden numbness in arms or legs:     Sudden onset of difficulty speaking or slurred speech:    Temporary loss of vision in one eye:     Problems with dizziness:  x       Gastrointestinal    Blood in stool:     Vomited blood:         Genitourinary    Burning when urinating:     Blood in urine:        Psychiatric    Major depression:         Hematologic    Bleeding problems:    Problems with blood clotting too easily:        Skin    Rashes or ulcers:        Constitutional    Fever or chills:      PHYSICAL EXAM: Vitals:   11/07/22 1022 11/07/22 1025  BP: (!) 178/92 (!) 167/104  Pulse: 61   Temp: 97.9 F (36.6 C)   SpO2: 96%   Weight: 247 lb 9.6 oz (112.3 kg)   Height: 6' 2"$  (1.88 m)     GENERAL: The patient is a well-nourished male, in no acute distress. The vital signs are documented above. CARDIOVASCULAR: Carotid arteries without bruits bilaterally.  2+ radial pulses bilaterally. PULMONARY: There is good air exchange  MUSCULOSKELETAL: There are no major deformities or cyanosis. [name]: No focal weakness or paresthesias are detected. SKIN: There are no ulcers or rashes noted. PSYCHIATRIC: The patient has a normal affect.  DATA:  I reviewed the CT images and discussed these at  length with the patient.  Does show some calcification and moderate stenosis at the origin of the right vertebral artery.  The right and left vertebral arteries are widely patent above this with a patent basilar artery as well.  There is some plaque but no narrowing of the carotid arteries bilaterally  MEDICAL ISSUES: I discussed the anatomy with the patient and his wife.  I have explained that he has not had any imaging risk of stroke related to his moderate vertebral stenosis.  Also explained that this would not be responsible for his passing out episode since he has normal flow to the basilar artery.  Would not recommend any further evaluation or follow-up.  He will see [place] again on an as-needed basis   Rosetta Posner, MD [facility] Tel [phone] Pager [phone]  Note: Portions of this report may have been transcribed using voice recognition software.  Every effort has been made to ensure accuracy; however, inadvertent computerized transcription errors may still be present.

## 2022-11-26 ENCOUNTER — Encounter: Payer: Self-pay | Admitting: Neurology

## 2022-11-26 ENCOUNTER — Ambulatory Visit (INDEPENDENT_AMBULATORY_CARE_PROVIDER_SITE_OTHER): Payer: No Typology Code available for payment source | Admitting: Neurology

## 2022-11-26 VITALS — BP 154/96 | HR 64 | Ht 74.0 in | Wt 248.0 lb

## 2022-11-26 DIAGNOSIS — I951 Orthostatic hypotension: Secondary | ICD-10-CM

## 2022-11-26 DIAGNOSIS — R7989 Other specified abnormal findings of blood chemistry: Secondary | ICD-10-CM | POA: Diagnosis not present

## 2022-11-26 NOTE — Progress Notes (Signed)
[facility]  PATIENT: Benjamin [name] DOB: [birth year]  REQUESTING CLINICIAN: Lavina Hamman, MD HISTORY FROM: Patient, spouse and chart review  REASON FOR VISIT: Weakness, Recurrent syncope    HISTORICAL  CHIEF COMPLAINT:  Chief Complaint  Patient presents with   New Patient (Initial Visit)    Rm 12. Accompanied by wife. NP internal referral for syncope.     HISTORY OF PRESENT ILLNESS:  This is 72 year old gentleman with multiple medical conditions including diabetes, hypertension, hyperlipidemia, cervical radiculopathy, orthostatic hypotension previously on midodrine, carpal tunnel syndrome who is presenting after being admitted to hospital following a syncope and neck pain.  He reports syncope usually occur in the morning, after waking up and taking a few steps. In the hospital his syncopal episode was attributed to orthostatic hypotension.  He did have orthostatic vitals which was positive for orthostatic hypotension.  Changes were made to his blood pressure medication.  During his workup also he was found to have a right vertebral stenosis, send vascular surgery, the stenosis was approximately 50 to 70% with good flow.  He is also on Eliquis.  No additional  recommendations given.   In the hospital wife reported have tremor, stated that he will spill things,  wife reported that if patient holds cups of coffee on both hands, it would spill it and he was also complaining of generalized weakness.  On his chart review patient has history of carpal tunnel syndrome status post carpal tunnel release surgery.  He feels like sometimes he is clumsy but his decreased grip might be due to his carpal tunnel disease.  Today there was no resting tremor or action tremor reported during history and not seen during exam.  His lab work show slightly elevated TSH but patient was not aware, he reports he will see his primary care doctor in April.  Since leaving the hospital he has not  had any additional falls.  Wife also reports that patient can be confused at times.  Sometimes get confused about his medications about the injection that he takes but otherwise he is still independent in all ADLs.   OTHER MEDICAL CONDITIONS: Diabetes, Hypertension, Hyperlipidemia, Orthostatic hypotension, Carpal tunnel syndrome, cervical radiculopathy    REVIEW OF SYSTEMS: Full 14 system review of systems performed and negative with exception of: As noted in the HPI   ALLERGIES: Allergies  Allergen Reactions   Niacin-Lovastatin Er Other (See Comments)    ADVICOR= Body aches   Statins     Body aches   Tamsulosin     Other reaction(s): Low blood pressure   Zocor [Simvastatin - High Dose] Other (See Comments)    Body aches.    HOME MEDICATIONS: Outpatient Medications Prior to Visit  Medication Sig Dispense Refill   acetaminophen (TYLENOL) 500 MG tablet Take 1,000 mg by mouth every 6 (six) hours as needed.     Alirocumab (PRALUENT [place]) Inject into the skin.     allopurinol (ZYLOPRIM) 100 MG tablet Take 100 mg by mouth daily.     apixaban (ELIQUIS) 5 MG TABS tablet Take 1 tablet (5 mg total) by mouth 2 (two) times daily. 60 tablet 1   cholecalciferol (VITAMIN D3) 25 MCG (1000 UNIT) tablet Take 1,000 Units by mouth daily.     cyclobenzaprine (FLEXERIL) 10 MG tablet Take 2 tablets by mouth at bedtime.     Elastic Bandages & Supports (ABDOMINAL BINDER/ELASTIC 2XL) MISC 1 each by Does not apply route as directed. 1 each 0  empagliflozin (JARDIANCE) 25 MG TABS tablet Take 12.5 mg by mouth daily. Half tablet daily     ezetimibe (ZETIA) 10 MG tablet Take 10 mg by mouth daily.     fexofenadine (ALLEGRA) 60 MG tablet Take 60 mg by mouth daily.     guaifenesin (HUMIBID E) 400 MG TABS tablet Take 400 mg by mouth 2 (two) times daily as needed (cough and congestion).     insulin glargine (LANTUS) 100 UNIT/ML injection Inject 30 Units into the skin daily. IN THE MORNING     losartan (COZAAR) 50  MG tablet Take 50 mg (1 tablet) in the am ,take 25 mg (1/2 tablet) at bedtime 135 tablet 3   metoprolol tartrate (LOPRESSOR) 25 MG tablet Take 1 tablet (25 mg total) by mouth 2 (two) times daily. 180 tablet 3   OVER THE COUNTER MEDICATION Pain relieving cream     pantoprazole (PROTONIX) 40 MG tablet Take 1 tablet (40 mg total) by mouth in the morning and at bedtime. 60 tablet 4   Semaglutide, 1 MG/DOSE, 2 MG/1.5ML SOPN Inject 1 mg into the skin every Wednesday.      sodium chloride (OCEAN) 0.65 % SOLN nasal spray Place 1 spray into both nostrils as needed for congestion.     Tafluprost, PF, 0.0015 % SOLN Place 1 drop into both eyes every evening.     Timolol Maleate PF 0.5 % SOLN Place 1 drop into both eyes in the morning and at bedtime.     folic acid (FOLVITE) 1 MG tablet Take 1 tablet (1 mg total) by mouth daily. 30 tablet 0   No facility-administered medications prior to visit.    PAST MEDICAL HISTORY: Past Medical History:  Diagnosis Date   Arthritis    Essential hypertension    Gout    History of stroke    Right occipital May 2018   Hypertensive heart disease    LVH with grade 2 diastolic dysfunction   Mixed hyperlipidemia    Statin intolerance   Rotator cuff tear    Chronic pain   Sleep apnea    a. intolerant to CPAP   Stroke ([place]) 10/1998   TIA   Tubular adenoma    Type 2 diabetes mellitus ([place])     PAST SURGICAL HISTORY: Past Surgical History:  Procedure Laterality Date   APPENDECTOMY     CARPAL TUNNEL RELEASE     COLONOSCOPY N/A 10/29/2013   Dr. [name]- sigmoid polyp status post cold snare removal. large sessile ascending colon polyp. debulked with saline assisted snare polypectomy . pancolonic diverticulosis. inadequate prep. tubular adenoma on bx   COLONOSCOPY N/A 02/22/2014   Dr. [name]: Saline-assisted snare polypectomy/biopsy for sprawling carpet polyp in the ascending colon which could not be completely removed. Status post biopsy (tubular adenoma), tattooed    COLONOSCOPY N/A 03/01/2014   [id number] polypectomy hemorrhage s/p bleeding   COLONOSCOPY N/A 06/08/2015   Procedure: COLONOSCOPY;  Surgeon: [name], MD;  [facility];  Service: Endoscopy;  Laterality: N/A;  0900   COLONOSCOPY WITH PROPOFOL N/A 11/14/2020   Procedure: COLONOSCOPY WITH PROPOFOL;  Surgeon: [name], MD;  [facility];  Service: Endoscopy;  Laterality: N/A;  8:15am   ESOPHAGOGASTRODUODENOSCOPY N/A 02/22/2014   Dr. [name]: Erosive reflux esophagitis. Schatzki ring status post dilation. Gastric and duodenal erosions with benign biopsies.   ESOPHAGOGASTRODUODENOSCOPY (EGD) WITH PROPOFOL N/A 06/07/2022   Procedure: ESOPHAGOGASTRODUODENOSCOPY (EGD) WITH PROPOFOL;  Surgeon: Eloise Harman, DO;  [facility];  Service: Endoscopy;  Laterality: N/A;   LAPAROSCOPIC RIGHT COLECTOMY N/A 04/16/2014   Procedure: LAPAROSCOPIC ASSISTED RIGHT COLECTOMY;  Surgeon: [name], MD;  Location: [facility];  Service: General;  Laterality: N/A;   MALONEY DILATION N/A 02/22/2014   Procedure: [name];  Surgeon: [name], MD;  [facility];  Service: Endoscopy;  Laterality: N/A;   POLYPECTOMY  06/07/2022   Procedure: POLYPECTOMY;  Surgeon: Eloise Harman, DO;  [facility];  Service: Endoscopy;;   SAVORY DILATION N/A 02/22/2014   Procedure: SAVORY DILATION;  Surgeon: [name], MD;  [facility];  Service: Endoscopy;  Laterality: N/A;    FAMILY HISTORY: Family History  Problem Relation Age of Onset   Hypertension Mother    Diabetes type II Mother    Colon cancer Neg Hx     SOCIAL HISTORY: Social History   Socioeconomic History   Marital status: Married    Spouse name: Not on file   Number of children: 2   Years of education: Not on file   Highest education level: Not on file  Occupational History   Occupation: Programmer, systems: [employer]   Tobacco Use   Smoking status: Former     Packs/day: 1.00    Years: 20.00    Total pack years: 20.00    Types: Cigarettes    Start date: 09/17/1961    Quit date: 09/18/1975    Years since quitting: 47.2   Smokeless tobacco: Never   Tobacco comments:    quit 35 years ago  Vaping Use   Vaping Use: Never used  Substance and Sexual Activity   Alcohol use: No    Comment: glass of wine every so often   Drug use: No   Sexual activity: Yes  Other Topics Concern   Not on file  Social History Narrative   Not on file   Social Determinants of Health   Financial Resource Strain: Not on file  Food Insecurity: No Food Insecurity (10/23/2022)   Hunger Vital Sign    Worried About Running Out of Food in the Last Year: Never true    Ran Out of Food in the Last Year: Never true  Transportation Needs: No Transportation Needs (10/23/2022)   PRAPARE - Hydrologist (Medical): No    Lack of Transportation (Non-Medical): No  Physical Activity: Not on file  Stress: Not on file  Social Connections: Not on file  Intimate Partner Violence: Not At Risk (10/23/2022)   Humiliation, Afraid, Rape, and Kick questionnaire    Fear of Current or Ex-Partner: No    Emotionally Abused: No    Physically Abused: No    Sexually Abused: No    PHYSICAL EXAM  GENERAL EXAM/CONSTITUTIONAL: Vitals:  Vitals:   11/26/22 0759 11/26/22 0831 11/26/22 0834  BP: (!) 164/85 (!) 170/89 (!) 154/96  Pulse: (!) 52 (!) 46 64  Weight: 248 lb (112.5 kg)    Height: '6\' 2"'$  (1.88 m)     Body mass index is 31.84 kg/m. Wt Readings from Last 3 Encounters:  11/26/22 248 lb (112.5 kg)  11/07/22 247 lb 9.6 oz (112.3 kg)  11/06/22 247 lb 9.6 oz (112.3 kg)   Patient is in no distress; well developed, nourished and groomed; neck is supple  EYES: Visual fields full to confrontation, Extraocular movements intacts,   MUSCULOSKELETAL: Gait, strength, tone, movements noted in Neurologic exam below  NEUROLOGIC: MENTAL  STATUS:      No data to  display         awake, alert, oriented to person, place and time recent and remote memory intact normal attention and concentration language fluent, comprehension intact, naming intact fund of knowledge appropriate  CRANIAL NERVE:  2nd, 3rd, 4th, 6th - Visual fields full to confrontation, extraocular muscles intact, no nystagmus 5th - facial sensation symmetric 7th - facial strength symmetric 8th - hearing intact 9th - palate elevates symmetrically, uvula midline 11th - shoulder shrug symmetric 12th - tongue protrusion midline  MOTOR:  normal bulk and tone, full strength in the BUE, BLE  SENSORY:  normal and symmetric to light touch  COORDINATION:  finger-nose-finger, fine finger movements normal, no tremors appreciated   GAIT/STATION:  normal   DIAGNOSTIC DATA (LABS, IMAGING, TESTING) - I reviewed patient records, labs, notes, testing and imaging myself where available.  Lab Results  Component Value Date   WBC 11.8 (H) 10/24/2022   HGB 15.8 10/24/2022   HCT 49.1 10/24/2022   MCV 87.7 10/24/2022   PLT 211 10/24/2022      Component Value Date/Time   NA 137 10/24/2022 0517   K 3.0 (L) 10/24/2022 0517   CL 100 10/24/2022 0517   CO2 26 10/24/2022 0517   GLUCOSE 101 (H) 10/24/2022 0517   BUN 20 10/24/2022 0517   CREATININE 1.25 (H) 10/24/2022 0517   CREATININE 1.33 02/13/2013 1535   CALCIUM 8.5 (L) 10/24/2022 0517   PROT 7.4 10/24/2022 0517   ALBUMIN 3.4 (L) 10/24/2022 0517   AST 18 10/24/2022 0517   ALT 17 10/24/2022 0517   ALKPHOS 107 10/24/2022 0517   BILITOT 0.8 10/24/2022 0517   GFRNONAA >60 10/24/2022 0517   GFRAA 45 (L) 05/13/2018 1731   Lab Results  Component Value Date   CHOL 180 01/22/2017   HDL 25 (L) 01/22/2017   LDLCALC 77 01/22/2017   TRIG 391 (H) 01/22/2017   CHOLHDL 7.2 01/22/2017   Lab Results  Component Value Date   HGBA1C 6.8 (H) 06/05/2022   Lab Results  Component Value Date   VITAMINB12 380 10/24/2022   Lab Results   Component Value Date   TSH 4.901 (H) 10/24/2022    MRI Brain 10/24/2022 1. No acute intracranial abnormality. 2. Advanced chronic microvascular ischemic changes of the white matter, progressed since prior MRI. 3. Remote right PCA territory infarct.  CTA head and neck 10/23/2022 1. No acute head CT finding. Old right occipital infarction. Chronic small-vessel ischemic change of the white matter. 2. No intracranial large vessel occlusion or proximal stenosis. 3. Atherosclerotic disease at both carotid bifurcations but without stenosis. 4. 50-70% stenosis of the right vertebral artery origin. Left vertebral artery widely patent.   ASSESSMENT AND PLAN  72 y.o. year old male with multiple medical conditions including diabetes, hypertension, hyperlipidemia, cervical radiculopathy, orthostatic hypotension, carpal tunnel syndrome who is presenting for follow-up after recent admission for syncope.  In the hospital he was found to be orthostatic.  His syncope was likely due to orthostatic hypotension.  There was some changes made to his blood pressure medications and since then he has not had any additional events.  He does complain of generalized weakness, dropping things but he has a history of carpal tunnel syndrome status post carpal tunnel release syndrome.  His B12 was slightly low and his TH TSH was slightly elevated.  I have advised patient to continue B12 supplement and to recheck his TSH at his next primary care  doctor appointment. He voices understanding. Also encouraged him to increase exercise.  Return as needed     1. Orthostatic hypotension   2. Elevated TSH   3. Low vitamin B12 level      Patient Instructions  Continue current medications Follow-up with your primary care doctor next month and have repeat TSH Follow-up as needed  No orders of the defined types were placed in this encounter.   No orders of the defined types were placed in this encounter.   Return if symptoms  worsen or fail to improve.  I have spent a total of 50 minutes dedicated to this patient today, preparing to see patient, performing a medically appropriate examination and evaluation, ordering tests and/or medications and procedures, and counseling and educating the patient/family/caregiver; independently interpreting result and communicating results to the family/patient/caregiver; and documenting clinical information in the electronic medical record.   Alric Ran, MD 11/26/2022, 9:04 AM  [facility] [address] [place], [place] [address] [phone]

## 2022-11-26 NOTE — Patient Instructions (Signed)
Continue current medications Follow-up with your primary care doctor next month and have repeat TSH Follow-up as needed

## 2022-11-29 ENCOUNTER — Encounter: Payer: Self-pay | Admitting: "Endocrinology

## 2022-11-29 ENCOUNTER — Ambulatory Visit (INDEPENDENT_AMBULATORY_CARE_PROVIDER_SITE_OTHER): Payer: No Typology Code available for payment source | Admitting: "Endocrinology

## 2022-11-29 VITALS — BP 158/92 | HR 81 | Ht 74.0 in | Wt 248.0 lb

## 2022-11-29 DIAGNOSIS — E039 Hypothyroidism, unspecified: Secondary | ICD-10-CM | POA: Insufficient documentation

## 2022-11-29 DIAGNOSIS — E876 Hypokalemia: Secondary | ICD-10-CM | POA: Diagnosis not present

## 2022-11-29 DIAGNOSIS — R232 Flushing: Secondary | ICD-10-CM

## 2022-11-29 MED ORDER — LEVOTHYROXINE SODIUM 25 MCG PO TABS: 25.0000 ug | ORAL_TABLET | Freq: Every day | ORAL | 1 refills | Status: DC

## 2022-11-29 MED ORDER — POTASSIUM CHLORIDE CRYS ER 20 MEQ PO TBCR: 20.0000 meq | EXTENDED_RELEASE_TABLET | Freq: Every day | ORAL | 1 refills | Status: AC

## 2022-11-29 NOTE — Progress Notes (Signed)
11/29/2022, 1:18 PM   Endocrinology follow-up note  Subjective:    Patient ID: Benjamin [name], male    DOB: 1951/01/06, PCP Stan Head, MD   Past Medical History:  Diagnosis Date   Arthritis    Essential hypertension    Gout    History of stroke    Right occipital May 2018   Hypertensive heart disease    LVH with grade 2 diastolic dysfunction   Mixed hyperlipidemia    Statin intolerance   Rotator cuff tear    Chronic pain   Sleep apnea    a. intolerant to CPAP   Stroke ([place]) 10/1998   TIA   Tubular adenoma    Type 2 diabetes mellitus ([place])    Past Surgical History:  Procedure Laterality Date   APPENDECTOMY     CARPAL TUNNEL RELEASE     COLONOSCOPY N/A 10/29/2013   Dr. [name]- sigmoid polyp status post cold snare removal. large sessile ascending colon polyp. debulked with saline assisted snare polypectomy . pancolonic diverticulosis. inadequate prep. tubular adenoma on bx   COLONOSCOPY N/A 02/22/2014   Dr. [name]: Saline-assisted snare polypectomy/biopsy for sprawling carpet polyp in the ascending colon which could not be completely removed. Status post biopsy (tubular adenoma), tattooed   COLONOSCOPY N/A 03/01/2014   [id number] polypectomy hemorrhage s/p bleeding   COLONOSCOPY N/A 06/08/2015   Procedure: COLONOSCOPY;  Surgeon: [name], MD;  [facility];  Service: Endoscopy;  Laterality: N/A;  0900   COLONOSCOPY WITH PROPOFOL N/A 11/14/2020   Procedure: COLONOSCOPY WITH PROPOFOL;  Surgeon: [name], MD;  [facility];  Service: Endoscopy;  Laterality: N/A;  8:15am   ESOPHAGOGASTRODUODENOSCOPY N/A 02/22/2014   Dr. [name]: Erosive reflux esophagitis. Schatzki ring status post dilation. Gastric and duodenal erosions with benign biopsies.   ESOPHAGOGASTRODUODENOSCOPY (EGD) WITH PROPOFOL N/A 06/07/2022   Procedure: ESOPHAGOGASTRODUODENOSCOPY (EGD) WITH PROPOFOL;  Surgeon: Eloise Harman, DO;  [facility];  Service: Endoscopy;  Laterality: N/A;   LAPAROSCOPIC RIGHT COLECTOMY N/A 04/16/2014   Procedure: LAPAROSCOPIC ASSISTED RIGHT COLECTOMY;  Surgeon: [name], MD;  Location: [place];  Service: General;  Laterality: N/A;   MALONEY DILATION N/A 02/22/2014   Procedure: [name];  Surgeon: [name], MD;  [facility];  Service: Endoscopy;  Laterality: N/A;   POLYPECTOMY  06/07/2022   Procedure: POLYPECTOMY;  Surgeon: Eloise Harman, DO;  [facility];  Service: Endoscopy;;   SAVORY DILATION N/A 02/22/2014   Procedure: SAVORY DILATION;  Surgeon: [name], MD;  [facility];  Service: Endoscopy;  Laterality: N/A;   Social History   Socioeconomic History   Marital status: Married    Spouse name: Not on file   Number of children: 2   Years of education: Not on file   Highest education level: Not on file  Occupational History   Occupation: Programmer, systems: [employer]   Tobacco Use   Smoking status: Former    Packs/day: 1.00    Years: 20.00    Additional pack years: 0.00    Total pack years: 20.00    Types: Cigarettes    Start  date: 09/17/1961    Quit date: 09/18/1975    Years since quitting: 47.2   Smokeless tobacco: Never   Tobacco comments:    quit 35 years ago  Vaping Use   Vaping Use: Never used  Substance and Sexual Activity   Alcohol use: No    Comment: glass of wine every so often   Drug use: No   Sexual activity: Yes  Other Topics Concern   Not on file  Social History Narrative   Not on file   Social Determinants of Health   Financial Resource Strain: Not on file  Food Insecurity: No Food Insecurity (10/23/2022)   Hunger Vital Sign    Worried About Running Out of Food in the Last Year: Never true    Ran Out of Food in the Last Year: Never true  Transportation Needs: No Transportation Needs (10/23/2022)   PRAPARE - Hydrologist (Medical):  No    Lack of Transportation (Non-Medical): No  Physical Activity: Not on file  Stress: Not on file  Social Connections: Not on file   Family History  Problem Relation Age of Onset   Hypertension Mother    Diabetes type II Mother    Colon cancer Neg Hx    Outpatient Encounter Medications as of 11/29/2022  Medication Sig   levothyroxine (SYNTHROID) 25 MCG tablet Take 1 tablet (25 mcg total) by mouth daily before breakfast.   acetaminophen (TYLENOL) 500 MG tablet Take 1,000 mg by mouth every 6 (six) hours as needed.   Alirocumab (PRALUENT [place]) Inject into the skin.   allopurinol (ZYLOPRIM) 100 MG tablet Take 100 mg by mouth daily.   apixaban (ELIQUIS) 5 MG TABS tablet Take 1 tablet (5 mg total) by mouth 2 (two) times daily.   cholecalciferol (VITAMIN D3) 25 MCG (1000 UNIT) tablet Take 1,000 Units by mouth daily.   cyclobenzaprine (FLEXERIL) 10 MG tablet Take 2 tablets by mouth at bedtime.   Elastic Bandages & Supports (ABDOMINAL BINDER/ELASTIC 2XL) MISC 1 each by Does not apply route as directed.   empagliflozin (JARDIANCE) 25 MG TABS tablet Take 12.5 mg by mouth daily. Half tablet daily   ezetimibe (ZETIA) 10 MG tablet Take 10 mg by mouth daily.   fexofenadine (ALLEGRA) 60 MG tablet Take 60 mg by mouth daily.   guaifenesin (HUMIBID E) 400 MG TABS tablet Take 400 mg by mouth 2 (two) times daily as needed (cough and congestion).   insulin glargine (LANTUS) 100 UNIT/ML injection Inject 30 Units into the skin daily. IN THE MORNING   losartan (COZAAR) 50 MG tablet Take 50 mg (1 tablet) in the am ,take 25 mg (1/2 tablet) at bedtime   metoprolol tartrate (LOPRESSOR) 25 MG tablet Take 1 tablet (25 mg total) by mouth 2 (two) times daily.   OVER THE COUNTER MEDICATION Pain relieving cream   pantoprazole (PROTONIX) 40 MG tablet Take 1 tablet (40 mg total) by mouth in the morning and at bedtime.   Semaglutide, 1 MG/DOSE, 2 MG/1.5ML SOPN Inject 1 mg into the skin every Wednesday.    sodium chloride  (OCEAN) 0.65 % SOLN nasal spray Place 1 spray into both nostrils as needed for congestion.   Tafluprost, PF, 0.0015 % SOLN Place 1 drop into both eyes every evening.   Timolol Maleate PF 0.5 % SOLN Place 1 drop into both eyes in the morning and at bedtime.   No facility-administered encounter medications on file as of 11/29/2022.   ALLERGIES: Allergies  Allergen Reactions   Niacin-Lovastatin Er Other (See Comments)    ADVICOR= Body aches   Statins     Body aches   Tamsulosin     Other reaction(s): Low blood pressure   Zocor [Simvastatin - High Dose] Other (See Comments)    Body aches.    VACCINATION STATUS: Immunization History  Administered Date(s) Administered   Influenza-Unspecified 07/18/2018    HPI KAHLEIL PENA is 72 y.o. male who presents today with a medical history as above. he is being seen in follow-up after he was seen in consultation for flushing/sweating requested by Stan Head, MD.   -His first visit was in October 2023.  He is accompanied by his wife to clinic. He is a not an optimal historian.  In October, he was seen for excessive sweating which no longer is an issue for him.   His subsequent labs did not show no evidence of pheochromocytoma.  His adrenal function was found to be normal.  He did have slightly elevated 5 HIAA levels and 24-hour urine samples.  He has multiple medical problems on polypharmacy including type 2 diabetes, hypertension, hyperlipidemia, depression CKD, CVA, heart failure.  He is on Cymbalta which was started 5 to 6 months ago. His other medications include Lantus, Jardiance, Ozempic, Lopressor. He was also dealing with dizziness and fainting.  He was started on Florinef 0.1 mg p.o. daily to help with this. He denies any prior knowledge of adrenal, pituitary, thyroid dysfunction.  His recent TSH was normal as well as A1c of 6.8%. His previsit labs show high TSH of 4.9.  His total testosterone is 385. He denies any paroxysms of  headaches associated with any sweating.  He still complains of fatigue. He does not have recent abdominal imaging to review.  Review of Systems  Constitutional: + Minimally fluctuating body weight,  + fatigue, + subjective hyperthermia.  Eyes: no blurry vision, no xerophthalmia ENT: no sore throat, no nodules palpated in throat, no dysphagia/odynophagia, no hoarseness   Objective:       11/29/2022    9:00 AM 11/26/2022    8:34 AM 11/26/2022    8:31 AM  Vitals with BMI  Height '6\' 2"'$     Weight 248 lbs    BMI [id number]    Systolic 0000000 [id number]  Diastolic 92 96 89  Pulse 81 64 46    BP (!) 158/92   Pulse 81   Ht '6\' 2"'$  (1.88 m)   Wt 248 lb (112.5 kg)   BMI 31.84 kg/m   Wt Readings from Last 3 Encounters:  11/29/22 248 lb (112.5 kg)  11/26/22 248 lb (112.5 kg)  11/07/22 247 lb 9.6 oz (112.3 kg)    Physical Exam  Constitutional:  Body mass index is 31.84 kg/m.,  not in acute distress, normal state of mind Eyes: PERRLA, EOMI, no exophthalmos ENT: moist mucous membranes, no gross thyromegaly, no gross cervical lymphadenopathy   CMP ( most recent) CMP     Component Value Date/Time   NA 137 10/24/2022 0517   K 3.0 (L) 10/24/2022 0517   CL 100 10/24/2022 0517   CO2 26 10/24/2022 0517   GLUCOSE 101 (H) 10/24/2022 0517   BUN 20 10/24/2022 0517   CREATININE 1.25 (H) 10/24/2022 0517   CREATININE 1.33 02/13/2013 1535   CALCIUM 8.5 (L) 10/24/2022 0517   PROT 7.4 10/24/2022 0517   ALBUMIN 3.4 (L) 10/24/2022 0517   AST 18 10/24/2022 0517   ALT 17 10/24/2022 0517  ALKPHOS 107 10/24/2022 0517   BILITOT 0.8 10/24/2022 0517   GFRNONAA >60 10/24/2022 0517   GFRAA 45 (L) 05/13/2018 1731     Diabetic Labs (most recent): Lab Results  Component Value Date   HGBA1C 6.8 (H) 06/05/2022   HGBA1C 9.7 (H) 01/22/2017   HGBA1C 7.0 (H) 04/12/2014     Lipid Panel ( most recent) Lipid Panel     Component Value Date/Time   CHOL 180 01/22/2017 0506   TRIG 391 (H) 01/22/2017 0506    HDL 25 (L) 01/22/2017 0506   CHOLHDL 7.2 01/22/2017 0506   VLDL 78 (H) 01/22/2017 0506   LDLCALC 77 01/22/2017 0506      Lab Results  Component Value Date   TSH 4.901 (H) 10/24/2022   TSH 0.927 06/05/2022   TSH 0.799 12/20/2012   TSH 1.749 10/22/2011     Recent Results (from the past 2160 hour(s))  Basic metabolic panel     Status: Abnormal   Collection Time: 10/23/22  4:21 PM  Result Value Ref Range   Sodium 135 135 - 145 mmol/L   Potassium 3.6 3.5 - 5.1 mmol/L   Chloride 98 98 - 111 mmol/L   CO2 25 22 - 32 mmol/L   Glucose, Bld 163 (H) 70 - 99 mg/dL    Comment: Glucose reference range applies only to samples taken after fasting for at least 8 hours.   BUN 23 8 - 23 mg/dL   Creatinine, Ser 1.50 (H) 0.61 - 1.24 mg/dL   Calcium 8.3 (L) 8.9 - 10.3 mg/dL   GFR, Estimated 49 (L) >60 mL/min    Comment: (NOTE) Calculated using the CKD-EPI Creatinine Equation (2021)    Anion gap 12 5 - 15    Comment: Performed at [facility], [address] [place], [place] [address]  CBC     Status: None   Collection Time: 10/23/22  4:21 PM  Result Value Ref Range   WBC 10.3 4.0 - 10.5 K/uL   RBC 5.64 4.22 - 5.81 MIL/uL   Hemoglobin 15.8 13.0 - 17.0 g/dL   HCT 49.3 39.0 - 52.0 %   MCV 87.4 80.0 - 100.0 fL   MCH 28.0 26.0 - 34.0 pg   MCHC 32.0 30.0 - 36.0 g/dL   RDW 13.9 11.5 - 15.5 %   Platelets 197 150 - 400 K/uL   nRBC 0.0 0.0 - 0.2 %    Comment: Performed at [facility], [address] [place], [place] [address]  Troponin I (High Sensitivity)     Status: None   Collection Time: 10/23/22  4:21 PM  Result Value Ref Range   Troponin I (High Sensitivity) 12 <18 ng/L    Comment: (NOTE) Elevated high sensitivity troponin I (hsTnI) values and significant  changes across serial measurements may suggest ACS but many other  chronic and acute conditions are known to elevate hsTnI results.  Refer to the "Links" section for chest pain algorithms and additional  guidance. Performed at [facility], [address], [place], [place] [address]   Vitamin B1     Status: None   Collection Time: 10/23/22  4:21 PM  Result Value Ref Range   Vitamin B1 (Thiamine) 165.8 66.5 - 200.0 nmol/L    Comment: (NOTE) This test was developed and its performance characteristics determined by [facility]. It has not been cleared or approved by the Food and Drug Administration. Performed At: [facility] [place], [name] [id number] Rush Farmer MD [id number]  Urinalysis, Routine w reflex microscopic -Urine, Clean Catch     Status: Abnormal   Collection Time: 10/23/22  7:00 PM  Result Value Ref Range   Color, Urine YELLOW YELLOW   APPearance CLEAR CLEAR   Specific Gravity, Urine 1.035 (H) 1.005 - 1.030   pH 5.0 5.0 - 8.0   Glucose, UA >=500 (A) NEGATIVE mg/dL   Hgb urine dipstick SMALL (A) NEGATIVE   Bilirubin Urine NEGATIVE NEGATIVE   Ketones, ur NEGATIVE NEGATIVE mg/dL   Protein, ur 30 (A) NEGATIVE mg/dL   Nitrite NEGATIVE NEGATIVE   Leukocytes,Ua NEGATIVE NEGATIVE   RBC / HPF 0-5 0 - 5 RBC/hpf   WBC, UA 0-5 0 - 5 WBC/hpf   Bacteria, UA NONE SEEN NONE SEEN   Squamous Epithelial / HPF 0-5 0 - 5 /HPF   Mucus PRESENT     Comment: Performed at [facility], [address] [place], [place] [address]  Troponin I (High Sensitivity)     Status: None   Collection Time: 10/23/22  7:23 PM  Result Value Ref Range   Troponin I (High Sensitivity) 13 <18 ng/L    Comment: (NOTE) Elevated high sensitivity troponin I (hsTnI) values and significant  changes across serial measurements may suggest ACS but many other  chronic and acute conditions are known to elevate hsTnI results.  Refer to the "Links" section for chest pain algorithms and additional  guidance. Performed at [facility], [address] [place], [place] [address]   Glucose, capillary     Status: Abnormal   Collection Time: 10/23/22 10:08 PM  Result Value Ref Range   Glucose-Capillary 108 (H) 70 - 99 mg/dL     Comment: Glucose reference range applies only to samples taken after fasting for at least 8 hours.   Comment 1 Notify RN    Comment 2 Document in Chart   Comprehensive metabolic panel     Status: Abnormal   Collection Time: 10/24/22  5:17 AM  Result Value Ref Range   Sodium 137 135 - 145 mmol/L   Potassium 3.0 (L) 3.5 - 5.1 mmol/L   Chloride 100 98 - 111 mmol/L   CO2 26 22 - 32 mmol/L   Glucose, Bld 101 (H) 70 - 99 mg/dL    Comment: Glucose reference range applies only to samples taken after fasting for at least 8 hours.   BUN 20 8 - 23 mg/dL   Creatinine, Ser 1.25 (H) 0.61 - 1.24 mg/dL   Calcium 8.5 (L) 8.9 - 10.3 mg/dL   Total Protein 7.4 6.5 - 8.1 g/dL   Albumin 3.4 (L) 3.5 - 5.0 g/dL   AST 18 15 - 41 U/L   ALT 17 0 - 44 U/L   Alkaline Phosphatase 107 38 - 126 U/L   Total Bilirubin 0.8 0.3 - 1.2 mg/dL   GFR, Estimated >60 >60 mL/min    Comment: (NOTE) Calculated using the CKD-EPI Creatinine Equation (2021)    Anion gap 11 5 - 15    Comment: Performed at [facility], [address] [place], [place] [address]  Magnesium     Status: None   Collection Time: 10/24/22  5:17 AM  Result Value Ref Range   Magnesium 2.2 1.7 - 2.4 mg/dL    Comment: Performed at [facility], [address], [place],  [address]  CBC with Differential/Platelet     Status: Abnormal   Collection Time: 10/24/22  5:17 AM  Result Value Ref Range   WBC 11.8 (H) 4.0 -  10.5 K/uL   RBC 5.60 4.22 - 5.81 MIL/uL   Hemoglobin 15.8 13.0 - 17.0 g/dL   HCT 49.1 39.0 - 52.0 %   MCV 87.7 80.0 - 100.0 fL   MCH 28.2 26.0 - 34.0 pg   MCHC 32.2 30.0 - 36.0 g/dL   RDW 13.9 11.5 - 15.5 %   Platelets 211 150 - 400 K/uL   nRBC 0.0 0.0 - 0.2 %   Neutrophils Relative % 57 %   Neutro Abs 6.7 1.7 - 7.7 K/uL   Lymphocytes Relative 31 %   Lymphs Abs 3.7 0.7 - 4.0 K/uL   Monocytes Relative 9 %   Monocytes Absolute 1.1 (H) 0.1 - 1.0 K/uL   Eosinophils Relative 2 %   Eosinophils Absolute 0.3 0.0 - 0.5 K/uL    Basophils Relative 1 %   Basophils Absolute 0.1 0.0 - 0.1 K/uL   Immature Granulocytes 0 %   Abs Immature Granulocytes 0.03 0.00 - 0.07 K/uL    Comment: Performed at [facility], [address] [place], [place] [address]  TSH     Status: Abnormal   Collection Time: 10/24/22  5:17 AM  Result Value Ref Range   TSH 4.901 (H) 0.350 - 4.500 uIU/mL    Comment: Performed by a 3rd Generation assay with a functional sensitivity of <=0.01 uIU/mL. Performed at [facility], [address], [place], [place] [address]   Vitamin B12     Status: None   Collection Time: 10/24/22  5:17 AM  Result Value Ref Range   Vitamin B-12 380 180 - 914 pg/mL    Comment: (NOTE) This assay is not validated for testing neonatal or myeloproliferative syndrome specimens for Vitamin B12 levels. Performed at [facility], [address], [place], [place] [address]   Folate     Status: None   Collection Time: 10/24/22  5:17 AM  Result Value Ref Range   Folate 8.2 >5.9 ng/mL    Comment: Performed at [facility], [address] [place], [place] [address]  Glucose, capillary     Status: None   Collection Time: 10/24/22  7:41 AM  Result Value Ref Range   Glucose-Capillary 92 70 - 99 mg/dL    Comment: Glucose reference range applies only to samples taken after fasting for at least 8 hours.  Glucose, capillary     Status: Abnormal   Collection Time: 10/24/22 11:36 AM  Result Value Ref Range   Glucose-Capillary 128 (H) 70 - 99 mg/dL    Comment: Glucose reference range applies only to samples taken after fasting for at least 8 hours.  ECHOCARDIOGRAM COMPLETE     Status: None   Collection Time: 10/24/22 12:36 PM  Result Value Ref Range   Weight 3,943.59 oz   Height 74 in   BP 111/76 mmHg   Area-P 1/2 4.89 cm2   S' Lateral 3.90 cm   AR max vel 1.91 cm2   AV Area mean vel 1.74 cm2   AV Area VTI 1.91 cm2   Est EF 50 - 55%    AV Peak grad 12.2 mmHg   Ao pk vel 1.75 m/s   AV Mean grad 6.2 mmHg      Assessment &  Plan:   1. Flushing - resolved.  2.  Hypothyroidism  - I have reviewed his  new and available endocrine records and clinically evaluated the patient. -Flushing is no longer an issue for him.  His recent labs showed mild hypothyroidism, no hypoglycemia.  His  24-hour urine studies did not show evidence of pheochromocytoma nor adrenal dysfunction.  He does not have hypogonadism.  Slight elevation of 5 HIAA will be repeated after patient modifies his diet by avoiding bananas, avocados, pineapples, prunes for at least 2 weeks. Pretest probability for carcinoids syndrome is low.  He would benefit from low-dose levothyroxine .  I discussed and prescribed levothyroxine 25 mcg p.o. daily before breakfast.    - We discussed about the correct intake of his thyroid hormone, on empty stomach at fasting, with water, separated by at least 30 minutes from breakfast and other medications,  and separated by more than 4 hours from calcium, iron, multivitamins, acid reflux medications (PPIs). -Patient is made aware of the fact that thyroid hormone replacement is needed for life, dose to be adjusted by periodic monitoring of thyroid function tests.  -His labs in February showed hypokalemia at 3.0 . he may benefit from low-dose potassium supplement if this persists.  - he is advised to maintain close follow up with Stan Head, MD for primary care needs.   I spent  21  minutes in the care of the patient today including review of labs from Thyroid Function, CMP, and other relevant labs ; imaging/biopsy records (current and previous including abstractions from other facilities); face-to-face time discussing  his lab results and symptoms, medications doses, his options of short and long term treatment based on the latest standards of care / guidelines;   and documenting the encounter.  Benjamin [name]  participated in the discussions, expressed understanding, and voiced agreement with the above plans.  All  questions were answered to his satisfaction. he is encouraged to contact clinic should he have any questions or concerns prior to his return visit.   Follow up plan: Return in about 3 weeks (around 12/20/2022) for F/U with Pre-visit Labs.   Glade Lloyd, MD [facility] [address] [place], [place] [address] Phone: [phone]  Fax: [phone]     11/29/2022, 1:18 PM  This note was partially dictated with voice recognition software. Similar sounding words can be transcribed inadequately or may not  be corrected upon review.

## 2022-12-19 LAB — CREATININE, URINE, 24 HOUR
Creatinine, 24H Ur: 1812 mg/24 hr (ref 1000–2000)
Creatinine, Urine: 69.7 mg/dL

## 2022-12-21 ENCOUNTER — Ambulatory Visit: Payer: No Typology Code available for payment source | Admitting: Student

## 2022-12-24 LAB — 5 HIAA W/CREATININE, 24 HR
5-HIAA, Ur: 2.5 mg/L
5-HIAA,Quant.,24 Hr Urine: 6.5 mg/24 hr (ref 0.0–14.9)
Creatinine, 24H Ur: 1755 mg/24 hr (ref 1000–2000)
Creatinine, Urine: 67.5 mg/dL

## 2022-12-26 ENCOUNTER — Ambulatory Visit: Payer: No Typology Code available for payment source | Admitting: "Endocrinology

## 2022-12-27 ENCOUNTER — Encounter: Payer: Self-pay | Admitting: "Endocrinology

## 2022-12-27 ENCOUNTER — Ambulatory Visit (INDEPENDENT_AMBULATORY_CARE_PROVIDER_SITE_OTHER): Payer: No Typology Code available for payment source | Admitting: "Endocrinology

## 2022-12-27 VITALS — BP 156/80 | HR 60 | Ht 74.0 in | Wt 246.2 lb

## 2022-12-27 DIAGNOSIS — I10 Essential (primary) hypertension: Secondary | ICD-10-CM

## 2022-12-27 DIAGNOSIS — E782 Mixed hyperlipidemia: Secondary | ICD-10-CM | POA: Diagnosis not present

## 2022-12-27 DIAGNOSIS — E039 Hypothyroidism, unspecified: Secondary | ICD-10-CM

## 2022-12-27 DIAGNOSIS — E1159 Type 2 diabetes mellitus with other circulatory complications: Secondary | ICD-10-CM | POA: Diagnosis not present

## 2022-12-27 NOTE — Patient Instructions (Signed)

## 2022-12-27 NOTE — Progress Notes (Signed)
12/27/2022, 6:58 PM   Endocrinology follow-up note  Subjective:    Patient ID: Benjamin [name], male    DOB: 23-Dec-1950, PCP Pearson Grippe, MD   Past Medical History:  Diagnosis Date   Arthritis    Essential hypertension    Gout    History of stroke    Right occipital May 2018   Hypertensive heart disease    LVH with grade 2 diastolic dysfunction   Mixed hyperlipidemia    Statin intolerance   Rotator cuff tear    Chronic pain   Sleep apnea    a. intolerant to CPAP   Stroke 10/1998   TIA   Tubular adenoma    Type 2 diabetes mellitus    Past Surgical History:  Procedure Laterality Date   APPENDECTOMY     CARPAL TUNNEL RELEASE     COLONOSCOPY N/A 10/29/2013   Dr. Jena Gauss- sigmoid polyp status post cold snare removal. large sessile ascending colon polyp. debulked with saline assisted snare polypectomy . pancolonic diverticulosis. inadequate prep. tubular adenoma on bx   COLONOSCOPY N/A 02/22/2014   Dr. Jena Gauss: Saline-assisted snare polypectomy/biopsy for sprawling carpet polyp in the ascending colon which could not be completely removed. Status post biopsy (tubular adenoma), tattooed   COLONOSCOPY N/A 03/01/2014   ZOX:WRUE polypectomy hemorrhage s/p bleeding   COLONOSCOPY N/A 06/08/2015   Procedure: COLONOSCOPY;  Surgeon: Corbin Ade, MD;  [facility];  Service: Endoscopy;  Laterality: N/A;  0900   COLONOSCOPY WITH PROPOFOL N/A 11/14/2020   Procedure: COLONOSCOPY WITH PROPOFOL;  Surgeon: Corbin Ade, MD;  [facility];  Service: Endoscopy;  Laterality: N/A;  8:15am   ESOPHAGOGASTRODUODENOSCOPY N/A 02/22/2014   Dr. Jena Gauss: Erosive reflux esophagitis. Schatzki ring status post dilation. Gastric and duodenal erosions with benign biopsies.   ESOPHAGOGASTRODUODENOSCOPY (EGD) WITH PROPOFOL N/A 06/07/2022   Procedure: ESOPHAGOGASTRODUODENOSCOPY (EGD) WITH PROPOFOL;  Surgeon: Lanelle Bal,  DO;  [facility];  Service: Endoscopy;  Laterality: N/A;   LAPAROSCOPIC RIGHT COLECTOMY N/A 04/16/2014   Procedure: LAPAROSCOPIC ASSISTED RIGHT COLECTOMY;  Surgeon: Ernestene Mention, MD;  Location: [facility] OR;  Service: General;  Laterality: N/A;   MALONEY DILATION N/A 02/22/2014   Procedure: Alvy Beal;  Surgeon: Corbin Ade, MD;  [facility];  Service: Endoscopy;  Laterality: N/A;   POLYPECTOMY  06/07/2022   Procedure: POLYPECTOMY;  Surgeon: Lanelle Bal, DO;  [facility];  Service: Endoscopy;;   SAVORY DILATION N/A 02/22/2014   Procedure: SAVORY DILATION;  Surgeon: Corbin Ade, MD;  [facility];  Service: Endoscopy;  Laterality: N/A;   Social History   Socioeconomic History   Marital status: Married    Spouse name: Not on file   Number of children: 2   Years of education: Not on file   Highest education level: Not on file  Occupational History   Occupation: Personnel officer    Employer: [employer]   Tobacco Use   Smoking status: Former    Packs/day: 1.00    Years: 20.00    Additional pack years: 0.00    Total pack years: 20.00    Types: Cigarettes    Start date: 09/17/1961  Quit date: 09/18/1975    Years since quitting: 47.3   Smokeless tobacco: Never   Tobacco comments:    quit 35 years ago  Vaping Use   Vaping Use: Never used  Substance and Sexual Activity   Alcohol use: No    Comment: glass of wine every so often   Drug use: No   Sexual activity: Yes  Other Topics Concern   Not on file  Social History Narrative   Not on file   Social Determinants of Health   Financial Resource Strain: Not on file  Food Insecurity: No Food Insecurity (10/23/2022)   Hunger Vital Sign    Worried About Running Out of Food in the Last Year: Never true    Ran Out of Food in the Last Year: Never true  Transportation Needs: No Transportation Needs (10/23/2022)   PRAPARE - Administrator, Civil Service (Medical): No    Lack  of Transportation (Non-Medical): No  Physical Activity: Not on file  Stress: Not on file  Social Connections: Not on file   Family History  Problem Relation Age of Onset   Hypertension Mother    Diabetes type II Mother    Colon cancer Neg Hx    Outpatient Encounter Medications as of 12/27/2022  Medication Sig   acetaminophen (TYLENOL) 500 MG tablet Take 1,000 mg by mouth every 6 (six) hours as needed.   Alirocumab (PRALUENT Harney) Inject into the skin.   allopurinol (ZYLOPRIM) 100 MG tablet Take 100 mg by mouth daily.   apixaban (ELIQUIS) 5 MG TABS tablet Take 1 tablet (5 mg total) by mouth 2 (two) times daily.   cholecalciferol (VITAMIN D3) 25 MCG (1000 UNIT) tablet Take 1,000 Units by mouth daily.   cyclobenzaprine (FLEXERIL) 10 MG tablet Take 2 tablets by mouth at bedtime.   Elastic Bandages & Supports (ABDOMINAL BINDER/ELASTIC 2XL) MISC 1 each by Does not apply route as directed.   empagliflozin (JARDIANCE) 25 MG TABS tablet Take 12.5 mg by mouth daily. Half tablet daily   ezetimibe (ZETIA) 10 MG tablet Take 10 mg by mouth daily.   fexofenadine (ALLEGRA) 60 MG tablet Take 60 mg by mouth daily.   guaifenesin (HUMIBID E) 400 MG TABS tablet Take 400 mg by mouth 2 (two) times daily as needed (cough and congestion).   insulin glargine (LANTUS) 100 UNIT/ML injection Inject 30 Units into the skin daily. IN THE MORNING   levothyroxine (SYNTHROID) 25 MCG tablet Take 1 tablet (25 mcg total) by mouth daily before breakfast.   losartan (COZAAR) 50 MG tablet Take 50 mg (1 tablet) in the am ,take 25 mg (1/2 tablet) at bedtime (Patient taking differently: Take 50 mg by mouth 2 (two) times daily. Take 50 mg (1 tablet) in the am ,take 25 mg (1/2 tablet) at bedtime)   metoprolol tartrate (LOPRESSOR) 25 MG tablet Take 1 tablet (25 mg total) by mouth 2 (two) times daily.   OVER THE COUNTER MEDICATION Pain relieving cream   pantoprazole (PROTONIX) 40 MG tablet Take 1 tablet (40 mg total) by mouth in the  morning and at bedtime.   potassium chloride SA (KLOR-CON M) 20 MEQ tablet Take 1 tablet (20 mEq total) by mouth daily with breakfast.   Semaglutide, 1 MG/DOSE, 2 MG/1.5ML SOPN Inject 1 mg into the skin every Wednesday.    sodium chloride (OCEAN) 0.65 % SOLN nasal spray Place 1 spray into both nostrils as needed for congestion.   Tafluprost, PF, 0.0015 % SOLN Place  1 drop into both eyes every evening.   Timolol Maleate PF 0.5 % SOLN Place 1 drop into both eyes in the morning and at bedtime.   No facility-administered encounter medications on file as of 12/27/2022.   ALLERGIES: Allergies  Allergen Reactions   Niacin-Lovastatin Er Other (See Comments)    ADVICOR= Body aches   Statins     Body aches   Tamsulosin     Other reaction(s): Low blood pressure   Zocor [Simvastatin - High Dose] Other (See Comments)    Body aches.    VACCINATION STATUS: Immunization History  Administered Date(s) Administered   Influenza-Unspecified 07/18/2018    HPI CHET GREENLEY is 72 y.o. male who presents today with a medical history as above. he is being seen in follow-up after he was seen in consultation for flushing/sweating requested by Pearson Grippe, MD.   -His first visit was in October 2023.  He is accompanied by his wife to clinic. He is a not an optimal historian.  In October, he was seen for excessive sweating which no longer is an issue for him.   His subsequent labs did not show no evidence of pheochromocytoma.  His adrenal function was found to be normal.  Prior to his last visit, he did have slightly elevated 5 HIAA levels and 24-hour urine samples. After he was advised to modify his diet with less serotonin content, his repeat labs show normal 5 HIAA levels. He is on Cymbalta which was started 5 to 6 months ago. He has no new complaints today except that he wants to discuss his diabetes care with me.  He has multiple medical problems on polypharmacy including type 2 diabetes,  hypertension, hyperlipidemia, depression CKD, CVA, heart failure.  For diabetes, he is taking Lantus 30 units nightly, Jardiance 25 mg p.o. once a day, Ozempic 1 mg subcutaneously weekly. He did not bring any logs nor meter with him.  He reports that his most recent A1c was 7.1%.  He denies any prior knowledge of adrenal, pituitary, thyroid dysfunction.  Based on his high TSH during his last visit, he was diagnosed with mild hypothyroidism, started on low-dose Synthroid 25 mcg p.o. daily before breakfast.  He is already telling the difference, feeling better.  His previsit labs show high TSH of 4.9.  His total testosterone is 385. He denies any paroxysms of headaches associated with any sweating.  He does not have recent abdominal imaging to review. 2018 currently Doppler showed less than 50% stenosis with plaque on the bifurcation of carotid arteries.  He says that his [place] doctors are following that up.  Review of Systems  Constitutional: + Minimally fluctuating body weight,  + fatigue, + subjective hyperthermia.  Eyes: no blurry vision, no xerophthalmia ENT: no sore throat, no nodules palpated in throat, no dysphagia/odynophagia, no hoarseness   Objective:       12/27/2022   10:06 AM 12/27/2022    9:26 AM 11/29/2022    9:00 AM  Vitals with BMI  Height  6\' 2"  6\' 2"   Weight  246 lbs 3 oz 248 lbs  BMI  31.6 31.83  Systolic 156 144 161  Diastolic 80 88 92  Pulse  60 81    BP (!) 156/80 Comment: R arm with manuel cuff  Pulse 60   Ht 6\' 2"  (1.88 m)   Wt 246 lb 3.2 oz (111.7 kg)   BMI 31.61 kg/m   Wt Readings from Last 3 Encounters:  12/27/22 246 lb  3.2 oz (111.7 kg)  11/29/22 248 lb (112.5 kg)  11/26/22 248 lb (112.5 kg)    Physical Exam  Constitutional:  Body mass index is 31.61 kg/m.,  not in acute distress, normal state of mind Eyes: PERRLA, EOMI, no exophthalmos ENT: moist mucous membranes, no gross thyromegaly, no gross cervical lymphadenopathy   CMP ( most  recent) CMP     Component Value Date/Time   NA 137 10/24/2022 0517   K 3.0 (L) 10/24/2022 0517   CL 100 10/24/2022 0517   CO2 26 10/24/2022 0517   GLUCOSE 101 (H) 10/24/2022 0517   BUN 20 10/24/2022 0517   CREATININE 1.25 (H) 10/24/2022 0517   CREATININE 1.33 02/13/2013 1535   CALCIUM 8.5 (L) 10/24/2022 0517   PROT 7.4 10/24/2022 0517   ALBUMIN 3.4 (L) 10/24/2022 0517   AST 18 10/24/2022 0517   ALT 17 10/24/2022 0517   ALKPHOS 107 10/24/2022 0517   BILITOT 0.8 10/24/2022 0517   GFRNONAA >60 10/24/2022 0517   GFRAA 45 (L) 05/13/2018 1731     Diabetic Labs (most recent): Lab Results  Component Value Date   HGBA1C 6.8 (H) 06/05/2022   HGBA1C 9.7 (H) 01/22/2017   HGBA1C 7.0 (H) 04/12/2014     Lipid Panel ( most recent) Lipid Panel     Component Value Date/Time   CHOL 180 01/22/2017 0506   TRIG 391 (H) 01/22/2017 0506   HDL 25 (L) 01/22/2017 0506   CHOLHDL 7.2 01/22/2017 0506   VLDL 78 (H) 01/22/2017 0506   LDLCALC 77 01/22/2017 0506      Lab Results  Component Value Date   TSH 4.901 (H) 10/24/2022   TSH 0.927 06/05/2022   TSH 0.799 12/20/2012   TSH 1.749 10/22/2011     Recent Results (from the past 2160 hour(s))  Basic metabolic panel     Status: Abnormal   Collection Time: 10/23/22  4:21 PM  Result Value Ref Range   Sodium 135 135 - 145 mmol/L   Potassium 3.6 3.5 - 5.1 mmol/L   Chloride 98 98 - 111 mmol/L   CO2 25 22 - 32 mmol/L   Glucose, Bld 163 (H) 70 - 99 mg/dL    Comment: Glucose reference range applies only to samples taken after fasting for at least 8 hours.   BUN 23 8 - 23 mg/dL   Creatinine, Ser 1.611.50 (H) 0.61 - 1.24 mg/dL   Calcium 8.3 (L) 8.9 - 10.3 mg/dL   GFR, Estimated 49 (L) >60 mL/min    Comment: (NOTE) Calculated using the CKD-EPI Creatinine Equation (2021)    Anion gap 12 5 - 15    Comment: Performed at [facility], [address] [place], [place] [address]  CBC     Status: None   Collection Time: 10/23/22  4:21 PM  Result Value  Ref Range   WBC 10.3 4.0 - 10.5 K/uL   RBC 5.64 4.22 - 5.81 MIL/uL   Hemoglobin 15.8 13.0 - 17.0 g/dL   HCT 54.049.3 98.139.0 - 19.152.0 %   MCV 87.4 80.0 - 100.0 fL   MCH 28.0 26.0 - 34.0 pg   MCHC 32.0 30.0 - 36.0 g/dL   RDW 47.813.9 29.511.5 - 62.115.5 %   Platelets 197 150 - 400 K/uL   nRBC 0.0 0.0 - 0.2 %    Comment: Performed at [facility], [address] [place], [place] [address]  Troponin I (High Sensitivity)     Status: None   Collection Time: 10/23/22  4:21  PM  Result Value Ref Range   Troponin I (High Sensitivity) 12 <18 ng/L    Comment: (NOTE) Elevated high sensitivity troponin I (hsTnI) values and significant  changes across serial measurements may suggest ACS but many other  chronic and acute conditions are known to elevate hsTnI results.  Refer to the "Links" section for chest pain algorithms and additional  guidance. Performed at [facility], [address] [place], [place] [address]   Vitamin B1     Status: None   Collection Time: 10/23/22  4:21 PM  Result Value Ref Range   Vitamin B1 (Thiamine) 165.8 66.5 - 200.0 nmol/L    Comment: (NOTE) This test was developed and its performance characteristics determined by [facility]. It has not been cleared or approved by the Food and Drug Administration. Performed At: [facility] [address] [place], [place] [id number] Jolene Schimke MD [id number]   Urinalysis, Routine w reflex microscopic -Urine, Clean Catch     Status: Abnormal   Collection Time: 10/23/22  7:00 PM  Result Value Ref Range   Color, Urine YELLOW YELLOW   APPearance CLEAR CLEAR   Specific Gravity, Urine 1.035 (H) 1.005 - 1.030   pH 5.0 5.0 - 8.0   Glucose, UA >=500 (A) NEGATIVE mg/dL   Hgb urine dipstick SMALL (A) NEGATIVE   Bilirubin Urine NEGATIVE NEGATIVE   Ketones, ur NEGATIVE NEGATIVE mg/dL   Protein, ur 30 (A) NEGATIVE mg/dL   Nitrite NEGATIVE NEGATIVE   Leukocytes,Ua NEGATIVE NEGATIVE   RBC / HPF 0-5 0 - 5 RBC/hpf   WBC, UA 0-5 0 - 5 WBC/hpf    Bacteria, UA NONE SEEN NONE SEEN   Squamous Epithelial / HPF 0-5 0 - 5 /HPF   Mucus PRESENT     Comment: Performed at [facility], [address], [place], [place] [address]  Troponin I (High Sensitivity)     Status: None   Collection Time: 10/23/22  7:23 PM  Result Value Ref Range   Troponin I (High Sensitivity) 13 <18 ng/L    Comment: (NOTE) Elevated high sensitivity troponin I (hsTnI) values and significant  changes across serial measurements may suggest ACS but many other  chronic and acute conditions are known to elevate hsTnI results.  Refer to the "Links" section for chest pain algorithms and additional  guidance. Performed at [facility], [address] [place], [place] [address]   Glucose, capillary     Status: Abnormal   Collection Time: 10/23/22 10:08 PM  Result Value Ref Range   Glucose-Capillary 108 (H) 70 - 99 mg/dL    Comment: Glucose reference range applies only to samples taken after fasting for at least 8 hours.   Comment 1 Notify RN    Comment 2 Document in Chart   Comprehensive metabolic panel     Status: Abnormal   Collection Time: 10/24/22  5:17 AM  Result Value Ref Range   Sodium 137 135 - 145 mmol/L   Potassium 3.0 (L) 3.5 - 5.1 mmol/L   Chloride 100 98 - 111 mmol/L   CO2 26 22 - 32 mmol/L   Glucose, Bld 101 (H) 70 - 99 mg/dL    Comment: Glucose reference range applies only to samples taken after fasting for at least 8 hours.   BUN 20 8 - 23 mg/dL   Creatinine, Ser 9.24 (H) 0.61 - 1.24 mg/dL   Calcium 8.5 (L) 8.9 - 10.3 mg/dL   Total Protein 7.4 6.5 - 8.1 g/dL   Albumin 3.4 (  L) 3.5 - 5.0 g/dL   AST 18 15 - 41 U/L   ALT 17 0 - 44 U/L   Alkaline Phosphatase 107 38 - 126 U/L   Total Bilirubin 0.8 0.3 - 1.2 mg/dL   GFR, Estimated >16 >10 mL/min    Comment: (NOTE) Calculated using the CKD-EPI Creatinine Equation (2021)    Anion gap 11 5 - 15    Comment: Performed at [facility], [address] [place], [place] [address]  Magnesium     Status: None    Collection Time: 10/24/22  5:17 AM  Result Value Ref Range   Magnesium 2.2 1.7 - 2.4 mg/dL    Comment: Performed at [facility], [address], [place], [place] [address]  CBC with Differential/Platelet     Status: Abnormal   Collection Time: 10/24/22  5:17 AM  Result Value Ref Range   WBC 11.8 (H) 4.0 - 10.5 K/uL   RBC 5.60 4.22 - 5.81 MIL/uL   Hemoglobin 15.8 13.0 - 17.0 g/dL   HCT 19.1 47.8 - 29.5 %   MCV 87.7 80.0 - 100.0 fL   MCH 28.2 26.0 - 34.0 pg   MCHC 32.2 30.0 - 36.0 g/dL   RDW 62.1 30.8 - 65.7 %   Platelets 211 150 - 400 K/uL   nRBC 0.0 0.0 - 0.2 %   Neutrophils Relative % 57 %   Neutro Abs 6.7 1.7 - 7.7 K/uL   Lymphocytes Relative 31 %   Lymphs Abs 3.7 0.7 - 4.0 K/uL   Monocytes Relative 9 %   Monocytes Absolute 1.1 (H) 0.1 - 1.0 K/uL   Eosinophils Relative 2 %   Eosinophils Absolute 0.3 0.0 - 0.5 K/uL   Basophils Relative 1 %   Basophils Absolute 0.1 0.0 - 0.1 K/uL   Immature Granulocytes 0 %   Abs Immature Granulocytes 0.03 0.00 - 0.07 K/uL    Comment: Performed at [facility], [address] [place], [place] [address]  TSH     Status: Abnormal   Collection Time: 10/24/22  5:17 AM  Result Value Ref Range   TSH 4.901 (H) 0.350 - 4.500 uIU/mL    Comment: Performed by a 3rd Generation assay with a functional sensitivity of <=0.01 uIU/mL. Performed at [facility], [address], [place], [place] [address]   Vitamin B12     Status: None   Collection Time: 10/24/22  5:17 AM  Result Value Ref Range   Vitamin B-12 380 180 - 914 pg/mL    Comment: (NOTE) This assay is not validated for testing neonatal or myeloproliferative syndrome specimens for Vitamin B12 levels. Performed at [facility], [address] [place], [place] [address]   Folate     Status: None   Collection Time: 10/24/22  5:17 AM  Result Value Ref Range   Folate 8.2 >5.9 ng/mL    Comment: Performed at [facility], [address], [place], [place] [address]  Glucose, capillary     Status:  None   Collection Time: 10/24/22  7:41 AM  Result Value Ref Range   Glucose-Capillary 92 70 - 99 mg/dL    Comment: Glucose reference range applies only to samples taken after fasting for at least 8 hours.  Glucose, capillary     Status: Abnormal   Collection Time: 10/24/22 11:36 AM  Result Value Ref Range   Glucose-Capillary 128 (H) 70 - 99 mg/dL    Comment: Glucose reference range applies only to samples taken after fasting for at  least 8 hours.  ECHOCARDIOGRAM COMPLETE     Status: None   Collection Time: 10/24/22 12:36 PM  Result Value Ref Range   Weight 3,943.59 oz   Height 74 in   BP 111/76 mmHg   Area-P 1/2 4.89 cm2   S' Lateral 3.90 cm   AR max vel 1.91 cm2   AV Area mean vel 1.74 cm2   AV Area VTI 1.91 cm2   Est EF 50 - 55%    AV Peak grad 12.2 mmHg   Ao pk vel 1.75 m/s   AV Mean grad 6.2 mmHg  5 HIAA w/Creatinine, 24 hr.     Status: None   Collection Time: 12/15/22 10:00 AM  Result Value Ref Range   Creatinine, Urine 67.5 Not Estab. mg/dL   Creatinine, 16X Ur 0,960 1,000 - 2,000 mg/24 hr   5-HIAA, Ur 2.5 Undefined mg/L    Comment: This test was developed and its performance characteristics determined by [facility]. It has not been cleared or approved by the Food and Drug Administration.    5-HIAA,Quant.,24 Hr Urine 6.5 0.0 - 14.9 mg/24 hr  Creatinine, urine, 24 hour     Status: None   Collection Time: 12/15/22 10:00 AM  Result Value Ref Range   Creatinine, Urine 69.7 Not Estab. mg/dL   Creatinine, 45W Ur 0,981 1,000 - 2,000 mg/24 hr      Assessment & Plan:   1. Flushing - resolved.  2.  Hypothyroidism 3.  Type 2 diabetes 4.  Hyperlipidemia  - I have reviewed his  new and available endocrine records and clinically evaluated the patient. -Flushing is no longer an issue for him.  His previous 24-hour urine studies did not show evidence of pheochromocytoma nor adrenal dysfunction.  He did have mild elevation of 5 HIAA which returned to normal after he modified  his diet by limiting his consumption of bananas, avocados, pineapples, prunes for at least 2 weeks.  He is allowed to resume this foods at half the amount that he used to enjoy. Pretest probability for carcinoids syndrome is low. He would benefit from levothyroxine.  He is advised to continue levothyroxine 25 mcg p.o. daily before breakfast.   - We discussed about the correct intake of his thyroid hormone, on empty stomach at fasting, with water, separated by at least 30 minutes from breakfast and other medications,  and separated by more than 4 hours from calcium, iron, multivitamins, acid reflux medications (PPIs). -Patient is made aware of the fact that thyroid hormone replacement is needed for life, dose to be adjusted by periodic monitoring of thyroid function tests.  He was initiated on low-dose potassium due to hypokalemia at 3 in February 2024. Regarding his type 2 diabetes: He is advised to continue Lantus 30 units nightly, Ozempic 1 mg subcutaneously weekly, Jardiance 25 mg p.o. once a day at breakfast.  He is encouraged to continue monitoring blood glucose twice a day-daily before breakfast and at bedtime. He is encouraged to call clinic for hypoglycemia below 70 or hyperglycemia above 200 mg per DL. His recent A1c was reported to be 7.1%, before which it was 6.8%. He is on ezetimibe 10 mg p.o. daily for hyperlipidemia, advised to continue.   - he is advised to maintain close follow up with Pearson Grippe, MD for primary care needs.   I spent  26  minutes in the care of the patient today including review of labs from Thyroid Function, CMP, and other relevant labs ; imaging/biopsy  records (current and previous including abstractions from other facilities); face-to-face time discussing  his lab results and symptoms, medications doses, his options of short and long term treatment based on the latest standards of care / guidelines;   and documenting the encounter.  Benjamin [name]   participated in the discussions, expressed understanding, and voiced agreement with the above plans.  All questions were answered to his satisfaction. he is encouraged to contact clinic should he have any questions or concerns prior to his return visit.    Follow up plan: Return in about 6 months (around 06/28/2023) for F/U with Pre-visit Labs, Meter/CGM/Logs, A1c here.   Marquis Lunch, MD [facility] [address] [place], [place] [address] Phone: [phone]  Fax: [phone]     12/27/2022, 6:58 PM  This note was partially dictated with voice recognition software. Similar sounding words can be transcribed inadequately or may not  be corrected upon review.

## 2023-03-12 ENCOUNTER — Telehealth: Payer: Self-pay | Admitting: Cardiology

## 2023-03-12 NOTE — Telephone Encounter (Signed)
Patient with diagnosis of afib on Eliquis for anticoagulation.    Procedure: L Wrist Carpal Tunnel Release and Left Ulnar Decompression at the elbow   Date of procedure: 04/17/23  CHA2DS2-VASc Score = 6   This indicates a 9.7% annual risk of stroke. The patient's score is based upon: CHF History: 0 HTN History: 1 Diabetes History: 1 Stroke History: 2 Vascular Disease History: 1 Age Score: 1 Gender Score: 0   CVA 2018, TIA 2000.  CrCl 27mL/min using adjusted body weight Platelet count 211K  Would see if MD performing procedure is ok with 2 day Eliquis hold prior to procedure instead given elevated CV risk including recurrent CVA. Should resume as soon as safely possible after, within 24 hours after lower bleed risk procedure and within 48 hours after higher bleed risk procedure.  **This guidance is not considered finalized until pre-operative APP has relayed final recommendations.**

## 2023-03-12 NOTE — Telephone Encounter (Signed)
   Pre-operative Risk Assessment    Patient Name: Benjamin [name]  DOB: 1951/04/21 MRN: [id number]      Request for Surgical Clearance    Procedure:   L Wrist Carpal Tunnel Release and Left Ulnar Decompression at the elbow   Date of Surgery:  Clearance 04/17/23                                 Surgeon:  Dr. Morene Rankins Surgeon's Group or Practice Name:  [facility]  Phone number:  [phone] Fax number:  [phone]   Type of Clearance Requested:   - Medical  Pharmacy want pt to hold apixaban 5mg  72 hours prior to surgery a And if so when can he resume?   Type of Anesthesia:  Not Indicated   Additional requests/questions:    [name]   03/12/2023, 10:29 AM

## 2023-03-13 NOTE — Telephone Encounter (Signed)
Preop team please contact requesting provider office to see if 2-day hold of Eliquis is appropriate for procedure per Pharm.D.  Thank you

## 2023-03-13 NOTE — Telephone Encounter (Signed)
I called and left message for [name] in regard to hold time for Eliquis. Pt has appt 03/22/23 with Randall An, PAC.   I will fax notes to requesting office to please review Recommendations:  Supple, Emeline Darling, RPH-CPP  Pharmacist Pharmacy   Telephone Encounter Signed   Creation Time: 03/12/2023  6:43 PM   Signed     Patient with diagnosis of afib on Eliquis for anticoagulation.     Procedure: L Wrist Carpal Tunnel Release and Left Ulnar Decompression at the elbow    Date of procedure: 04/17/23   CHA2DS2-VASc Score = 6   This indicates a 9.7% annual risk of stroke. The patient's score is based upon: CHF History: 0 HTN History: 1 Diabetes History: 1 Stroke History: 2 Vascular Disease History: 1 Age Score: 1 Gender Score: 0   CVA 2018, TIA 2000.   CrCl 75mL/min using adjusted body weight Platelet count 211K   Would see if MD performing procedure is ok with 2 day Eliquis hold prior to procedure instead given elevated CV risk including recurrent CVA. Should resume as soon as safely possible after, within 24 hours after lower bleed risk procedure and within 48 hours after higher bleed risk procedure.   **This guidance is not considered finalized until pre-operative APP has relayed final recommendations.**

## 2023-03-13 NOTE — Telephone Encounter (Signed)
Pt has appt with Randall An, [facility] 03/22/23 @ 1:30. I have added need pre op clearance to appt notes. I will update all parties involved.

## 2023-03-20 NOTE — Progress Notes (Signed)
Cardiology Office Note    Date:  03/22/2023  ID:  Benjamin, [name] 01/15/1951, MRN [id number] Cardiologist: Nona Dell, MD    History of Present Illness:    Benjamin [name] is a 72 y.o. male with past medical history of permanent atrial fibrillation, carotid artery stenosis, HTN, HLD, Type II DM, prior CVA, orthostatic hypotension and Stage III CKD who presents to the office today for 25-month follow-up.  He was examined by Dr. Diona Browner in 10/2022 and had recently been admitted for evaluation of syncope and metabolic encephalopathy as Brain MRI during admission showed evidence of prior infarcts but no acute abnormalities. His syncopal episode was felt to be secondary to orthostasis but he had previously been intolerant to Midodrine and Florinef given hypertension with these. At the time of follow-up, he denied any recent anginal symptoms and no dizziness or recurrent syncope. He was continued on Eliquis for anticoagulation along with Lopressor 25 mg twice daily for rate-control. His blood pressure had been elevated since hospital discharge, therefore Losartan was titrated from 50 mg daily to 50 mg in AM/25 mg in PM.  In the interim, the office did receive a cardiac clearance request for carpal tunnel release and left ulnar decompression at the elbow. The request mentioned to hold Eliquis for 72 hours prior to the procedure and pharmacy recommended holding this for 48 hours if the surgeon was in agreement. It was recommended to address medical clearance at the time of his visit.  In talking with the patient and his wife today, he reports things have overall been stable since his last office visit. He denies any recent chest pain or dyspnea on exertion. No recent palpitations, orthopnea, PND or pitting edema. His blood pressure was initially recorded at 146/78 during today's visit and it was actually further increased to 152/78 on recheck. He reports this has been elevated when checked at home as  well.  He denies any side effects since Losartan was titrated at his last visit.   Studies Reviewed:   Echocardiogram: 10/2022 IMPRESSIONS     1. Left ventricular ejection fraction, by estimation, is 50 to 55%. The  left ventricle has low normal function. The left ventricle has no regional  wall motion abnormalities. There is mild left ventricular hypertrophy.  Left ventricular diastolic  parameters are indeterminate.   2. Right ventricular systolic function is normal. The right ventricular  size is normal. There is normal pulmonary artery systolic pressure.   3. Left atrial size was moderately dilated.   4. The mitral valve is normal in structure. Mild mitral valve  regurgitation. No evidence of mitral stenosis.   5. The aortic valve is tricuspid. There is severe calcifcation of the  aortic valve. There is severe thickening of the aortic valve. Aortic valve  regurgitation is not visualized. No aortic stenosis is present.   6. The inferior vena cava is dilated in size with <50% respiratory  variability, suggesting right atrial pressure of 15 mmHg.   Risk Assessment/Calculations:    CHA2DS2-VASc Score = 6   This indicates a 9.7% annual risk of stroke. The patient's score is based upon: CHF History: 0 HTN History: 1 Diabetes History: 1 Stroke History: 2 Vascular Disease History: 1 Age Score: 1 Gender Score: 0   Physical Exam:   VS:  BP (!) 146/78   Pulse 70   Ht 6\' 2"  (1.88 m)   Wt 244 lb (110.7 kg)   SpO2 95%   BMI 31.33 kg/m  Wt Readings from Last 3 Encounters:  03/22/23 244 lb (110.7 kg)  12/27/22 246 lb 3.2 oz (111.7 kg)  11/29/22 248 lb (112.5 kg)     GEN: Well nourished, well developed male appearing in no acute distress NECK: No JVD; No carotid bruits CARDIAC: Irregularly irregular, 2/6 systolic murmur along RUSB.  RESPIRATORY:  Clear to auscultation without rales, wheezing or rhonchi  ABDOMEN: Appears non-distended. No obvious abdominal  masses. EXTREMITIES: No clubbing or cyanosis. No pitting edema.  Distal pedal pulses are 2+ bilaterally.   Assessment and Plan:   1. Permanent Atrial Fibrillation - He denies any recent palpitations and his heart rate is well-controlled in the 70's today. Continue Lopressor 25 mg twice daily for rate-control. - No reports of active bleeding. He remains on Eliquis 5 mg twice daily for anticoagulation. CBC in 10/2022 showed his hemoglobin was stable at 15.8 with platelets at 211 K.   2. Carotid Artery Stenosis - CTA Neck in 10/2022 showed patent carotid arteries with soft plaque. Did have 50 to 70% stenosis along the right vertebral artery. He is followed by Vascular Surgery. Remains on Zetia 10 mg daily and Praluent as he was previously intolerant to statin therapy.  3. HTN complicated by Orthostatic Hypotension - He denies any recent dizziness or presyncope and his blood pressure has been elevated when checked at home and is also elevated during today's visit. He has overall been tolerating the recent dose increase of Losartan without side effects, therefore we will attempt to titrate Losartan to 50 mg twice daily. Repeat BMET in 2 weeks for reassessment of electrolytes and renal function. Continue Lopressor 25 mg twice daily. I encouraged him to keep a BP log and return this in several weeks.  4. HLD - Followed by his PCP at the [facility]. We will request a copy of most recent labs but the patient's wife reports his LDL was around 30 on most recent check. Continue Zetia and Praluent.   5. History of CVA - Followed by Neurology. He is no longer on ASA given the need for anticoagulation. He does remain on Zetia 10 mg daily and Praluent as he was previously intolerant to statins.  6. Cardiac Clearance for Carpal Tunnel Release - He is able to perform more than 4 METS of activity without anginal symptoms and does not require further cardiac testing prior to his upcoming surgery. He was cleared by  pharmacy to hold Eliquis for 48 hours. Will fax today's note to the requesting provider.  Signed, Ellsworth Lennox, PA-C

## 2023-03-22 ENCOUNTER — Encounter: Payer: Self-pay | Admitting: Student

## 2023-03-22 ENCOUNTER — Ambulatory Visit: Payer: No Typology Code available for payment source | Attending: Student | Admitting: Student

## 2023-03-22 VITALS — BP 146/78 | HR 70 | Ht 74.0 in | Wt 244.0 lb

## 2023-03-22 DIAGNOSIS — I4821 Permanent atrial fibrillation: Secondary | ICD-10-CM | POA: Diagnosis not present

## 2023-03-22 DIAGNOSIS — Z01818 Encounter for other preprocedural examination: Secondary | ICD-10-CM

## 2023-03-22 DIAGNOSIS — I10 Essential (primary) hypertension: Secondary | ICD-10-CM | POA: Diagnosis not present

## 2023-03-22 DIAGNOSIS — Z79899 Other long term (current) drug therapy: Secondary | ICD-10-CM | POA: Diagnosis not present

## 2023-03-22 DIAGNOSIS — E1122 Type 2 diabetes mellitus with diabetic chronic kidney disease: Secondary | ICD-10-CM | POA: Diagnosis not present

## 2023-03-22 DIAGNOSIS — Z8673 Personal history of transient ischemic attack (TIA), and cerebral infarction without residual deficits: Secondary | ICD-10-CM | POA: Diagnosis not present

## 2023-03-22 DIAGNOSIS — I6523 Occlusion and stenosis of bilateral carotid arteries: Secondary | ICD-10-CM

## 2023-03-22 DIAGNOSIS — E785 Hyperlipidemia, unspecified: Secondary | ICD-10-CM

## 2023-03-22 MED ORDER — METOPROLOL TARTRATE 25 MG PO TABS: 25.0000 mg | ORAL_TABLET | Freq: Two times a day (BID) | ORAL | 3 refills | Status: DC

## 2023-03-22 MED ORDER — LOSARTAN POTASSIUM 50 MG PO TABS: 50.0000 mg | ORAL_TABLET | Freq: Two times a day (BID) | ORAL | 3 refills | Status: AC

## 2023-03-22 NOTE — Patient Instructions (Signed)
Medication Instructions:  INCREASE Losartan to 50 mg twice a fay   Labwork: Bmet   Testing/Procedures: None today  Follow-Up: 6 months  Any Other Special Instructions Will Be Listed Below (If Applicable).   Keep daily BP log and bring back to office for review  If you need a refill on your cardiac medications before your next appointment, please call your pharmacy.

## 2023-03-25 ENCOUNTER — Telehealth: Payer: Self-pay | Admitting: "Endocrinology

## 2023-03-25 NOTE — Telephone Encounter (Signed)
Called pt. No answer °

## 2023-03-25 NOTE — Telephone Encounter (Signed)
Pts wife stated  she needed to speak to nurse about husbands diabetes.

## 2023-03-25 NOTE — Telephone Encounter (Signed)
Pt has been seen for Hypothyroidism. I did not see where pt has been seen for DM. Pt is requesting a Libre. Does he need a office visit to discuss his diabetes. Just wanted to make sure Im not missing something.

## 2023-03-26 NOTE — Telephone Encounter (Signed)
Left pt msg that he would need to be seen by Dr Fransico Him for diabetes since he has only been seen here for thyroid issues. Did notify him that Dr Dennie Bible appt are out to end of Aug-Sept and to call if he still wants to schedule for a diabetes consult.

## 2023-03-26 NOTE — Telephone Encounter (Signed)
Spoke to pt to relay Dr. Fransico Him could see him for his diabetes at next visit, but pt needed to [facility] to see if [facility] would [facility] his [facility].  Pt said he would call [facility] and then let us know.

## 2023-03-26 NOTE — Telephone Encounter (Signed)
Called pt. Left VM.

## 2023-04-03 NOTE — Telephone Encounter (Signed)
Patient's wife called back and said that [facility] will cover the cost for him to be treated here for his Diabetes along with his thyroid if I have Dr Fransico Him sign the RFS and fax to them. I will take care of this

## 2023-04-05 ENCOUNTER — Other Ambulatory Visit (HOSPITAL_COMMUNITY)
Admission: RE | Admit: 2023-04-05 | Discharge: 2023-04-05 | Disposition: A | Discharge status: Home / Self Care | Payer: No Typology Code available for payment source | Source: Ambulatory Visit | Attending: Student | Admitting: Student

## 2023-04-05 ENCOUNTER — Other Ambulatory Visit: Payer: Self-pay

## 2023-04-05 DIAGNOSIS — Z79899 Other long term (current) drug therapy: Secondary | ICD-10-CM

## 2023-04-05 LAB — BASIC METABOLIC PANEL
Anion gap: 10 (ref 5–15)
BUN: 24 mg/dL — ABNORMAL HIGH (ref 8–23)
CO2: 26 mmol/L (ref 22–32)
Calcium: 8.3 mg/dL — ABNORMAL LOW (ref 8.9–10.3)
Chloride: 102 mmol/L (ref 98–111)
Creatinine, Ser: 1.77 mg/dL — ABNORMAL HIGH (ref 0.61–1.24)
GFR, Estimated: 40 mL/min — ABNORMAL LOW (ref >60)
Glucose, Bld: 248 mg/dL — ABNORMAL HIGH (ref 70–99)
Potassium: 3.5 mmol/L (ref 3.5–5.1)
Sodium: 138 mmol/L (ref 135–145)

## 2023-04-09 ENCOUNTER — Encounter: Payer: Self-pay | Admitting: Student

## 2023-04-10 ENCOUNTER — Telehealth: Payer: Self-pay | Admitting: *Deleted

## 2023-04-10 MED ORDER — AMLODIPINE BESYLATE 2.5 MG PO TABS: 2.5000 mg | ORAL_TABLET | Freq: Every day | ORAL | 3 refills | Status: DC

## 2023-04-10 NOTE — Telephone Encounter (Signed)
-----   Message from Ellsworth Lennox sent at 04/09/2023  7:23 PM EDT ----- Please let the patient know I reviewed his BP log and his readings remain above goal despite recent titration of Losartan. Given that he is on the max dose of this, I would recommend adding Amlodipine 2.5mg  daily. Would start at a low dose given prior orthostatic hypotension. Continue with plans for repeat BMET in 2-3 weeks as discussed in prior result note.

## 2023-04-10 NOTE — Telephone Encounter (Signed)
Rx sent to both [facility] and [facility] pharmacies

## 2023-04-12 ENCOUNTER — Encounter: Payer: Self-pay | Admitting: Student

## 2023-04-12 DIAGNOSIS — I4821 Permanent atrial fibrillation: Secondary | ICD-10-CM | POA: Insufficient documentation

## 2023-05-03 ENCOUNTER — Other Ambulatory Visit (HOSPITAL_COMMUNITY)
Admission: RE | Admit: 2023-05-03 | Discharge: 2023-05-03 | Disposition: A | Discharge status: Home / Self Care | Payer: Medicare HMO | Source: Ambulatory Visit | Attending: Student | Admitting: Student

## 2023-05-03 ENCOUNTER — Encounter: Payer: Self-pay | Admitting: Student

## 2023-05-03 DIAGNOSIS — Z79899 Other long term (current) drug therapy: Secondary | ICD-10-CM

## 2023-05-03 LAB — BASIC METABOLIC PANEL
Anion gap: 14 (ref 5–15)
BUN: 22 mg/dL (ref 8–23)
CO2: 25 mmol/L (ref 22–32)
Calcium: 8.5 mg/dL — ABNORMAL LOW (ref 8.9–10.3)
Chloride: 99 mmol/L (ref 98–111)
Creatinine, Ser: 1.55 mg/dL — ABNORMAL HIGH (ref 0.61–1.24)
GFR, Estimated: 47 mL/min — ABNORMAL LOW (ref >60)
Glucose, Bld: 174 mg/dL — ABNORMAL HIGH (ref 70–99)
Potassium: 3.9 mmol/L (ref 3.5–5.1)
Sodium: 138 mmol/L (ref 135–145)

## 2023-05-21 ENCOUNTER — Telehealth: Payer: Self-pay | Admitting: "Endocrinology

## 2023-05-21 ENCOUNTER — Other Ambulatory Visit: Payer: Self-pay | Admitting: *Deleted

## 2023-05-21 DIAGNOSIS — E039 Hypothyroidism, unspecified: Secondary | ICD-10-CM

## 2023-05-21 MED ORDER — LEVOTHYROXINE SODIUM 25 MCG PO TABS: 25.0000 ug | ORAL_TABLET | Freq: Every day | ORAL | 1 refills | Status: AC

## 2023-05-21 NOTE — Telephone Encounter (Signed)
Patient is asking for a refill on his levothyroxine (SYNTHROID) 25 MCG tablet to the [facility] in [place]

## 2023-05-21 NOTE — Telephone Encounter (Signed)
A prescription was sent in to the request pharmacy.

## 2023-06-13 ENCOUNTER — Telehealth: Payer: Self-pay | Admitting: "Endocrinology

## 2023-06-13 NOTE — Telephone Encounter (Signed)
Called pt and left a VM to return my call.  Per [facility] email (in my email) [facility] has been deferred back to the [facility] endocrinology department.  The patient should reach out to his/her primary care provider for any questions / concerns. We will not be supplying an authorization for further care.  I have canceled his next appt

## 2023-06-28 ENCOUNTER — Ambulatory Visit: Payer: Medicare HMO | Admitting: "Endocrinology

## 2023-09-26 ENCOUNTER — Telehealth: Payer: Self-pay | Admitting: Cardiology

## 2023-09-26 DIAGNOSIS — K760 Fatty (change of) liver, not elsewhere classified: Secondary | ICD-10-CM | POA: Insufficient documentation

## 2023-09-26 DIAGNOSIS — G894 Chronic pain syndrome: Secondary | ICD-10-CM | POA: Insufficient documentation

## 2023-09-26 DIAGNOSIS — G4733 Obstructive sleep apnea (adult) (pediatric): Secondary | ICD-10-CM | POA: Insufficient documentation

## 2023-09-26 DIAGNOSIS — I639 Cerebral infarction, unspecified: Secondary | ICD-10-CM | POA: Insufficient documentation

## 2023-09-26 DIAGNOSIS — E113299 Type 2 diabetes mellitus with mild nonproliferative diabetic retinopathy without macular edema, unspecified eye: Secondary | ICD-10-CM | POA: Insufficient documentation

## 2023-09-26 DIAGNOSIS — R351 Nocturia: Secondary | ICD-10-CM | POA: Insufficient documentation

## 2023-09-26 DIAGNOSIS — G5603 Carpal tunnel syndrome, bilateral upper limbs: Secondary | ICD-10-CM | POA: Insufficient documentation

## 2023-09-26 DIAGNOSIS — D485 Neoplasm of uncertain behavior of skin: Secondary | ICD-10-CM | POA: Insufficient documentation

## 2023-09-26 DIAGNOSIS — Z961 Presence of intraocular lens: Secondary | ICD-10-CM | POA: Insufficient documentation

## 2023-09-26 DIAGNOSIS — E1121 Type 2 diabetes mellitus with diabetic nephropathy: Secondary | ICD-10-CM | POA: Insufficient documentation

## 2023-09-26 DIAGNOSIS — L820 Inflamed seborrheic keratosis: Secondary | ICD-10-CM | POA: Insufficient documentation

## 2023-09-26 DIAGNOSIS — M79641 Pain in right hand: Secondary | ICD-10-CM | POA: Insufficient documentation

## 2023-09-26 DIAGNOSIS — L603 Nail dystrophy: Secondary | ICD-10-CM | POA: Insufficient documentation

## 2023-09-26 DIAGNOSIS — M25512 Pain in left shoulder: Secondary | ICD-10-CM | POA: Insufficient documentation

## 2023-09-26 NOTE — Telephone Encounter (Signed)
   Name: Benjamin [name]  DOB: 1951/02/03  MRN: [id number]  Primary Cardiologist: Jayson Sierras, MD   Preoperative team, please contact this patient and set up a phone call appointment for further preoperative risk assessment. Please obtain consent and complete medication review. Thank you for your help.  I confirm that guidance regarding antiplatelet and oral anticoagulation therapy has been completed and, if necessary, noted below.  Per Pharm D and Dr. Sierras, patient may hold Eliquis  for 3 days prior to procedure. Patient will not need Lovenox  bridge.    I also confirmed the patient resides in the state of [place] . As per [facility] Medical Board telemedicine laws, the patient must reside in the state in which the provider is licensed.   Barnie Hila, NP 09/26/2023, 4:43 PM [facility]

## 2023-09-26 NOTE — Telephone Encounter (Signed)
   Pre-operative Risk Assessment    Patient Name: Benjamin [name]  DOB: 1951-06-27 MRN: [id number]      Request for Surgical Clearance    Procedure:   Cervical Injection  Date of Surgery:  Clearance 10/14/23                                 Surgeon:  Dr. Darlean  Surgeon's Group or Practice Name:  [facility]  Phone number:  [phone] Fax number:  [phone]   Type of Clearance Requested:   - Medical    Type of Anesthesia:  Not Indicated   Additional requests/questions:    Bonney Burnadette JAYSON Delynn   09/26/2023, 1:57 PM

## 2023-09-26 NOTE — Telephone Encounter (Signed)
 Patient can hold Eliquis for 3 days prior to procedure. No Lovenox bridge needed.

## 2023-09-26 NOTE — Telephone Encounter (Signed)
 Please advise holding Eliquis prior to Cervical injection.  Thank you!  DW

## 2023-09-26 NOTE — Telephone Encounter (Signed)
1st attempt to reach pt to schedule tele visit. Lvm  

## 2023-09-26 NOTE — Telephone Encounter (Signed)
 Patient with diagnosis of A Fib on Eliquis  for anticoagulation.    Procedure: cervical injection Date of procedure: 10/14/23   CHA2DS2-VASc Score = 7  This indicates a 11.2% annual risk of stroke. The patient's score is based upon: CHF History: 1 HTN History: 1 Diabetes History: 1 Stroke History: 2 Vascular Disease History: 1 Age Score: 1 Gender Score: 0   CrCl 65 ml.min Platelet count 211K  Patient will need to hold Eliquis  for 3 days for cervical injection. Due to elevated risk score with history of stroke will route to Dr Debera for input.  **This guidance is not considered finalized until pre-operative APP has relayed final recommendations.**

## 2023-09-27 NOTE — Telephone Encounter (Signed)
 2nd attempt to reach the pt to scheduled tele preop appt.

## 2023-09-30 ENCOUNTER — Telehealth: Payer: Self-pay | Admitting: *Deleted

## 2023-09-30 NOTE — Telephone Encounter (Signed)
 Pt has been scheduled tele pre op appt 10/08/23. Med rec and consent are done.     Patient Consent for Virtual Visit        Benjamin [name] has provided verbal consent on 09/30/2023 for a virtual visit (video or telephone).   CONSENT FOR VIRTUAL VISIT FOR:  Benjamin [name]  By participating in this virtual visit I agree to the following:  I hereby voluntarily request, consent and authorize [facility] and its employed or contracted physicians, physician assistants, nurse practitioners or other licensed health care professionals (the Practitioner), to provide me with telemedicine health care services (the "Services) as deemed necessary by the treating Practitioner. I acknowledge and consent to receive the Services by the Practitioner via telemedicine. I understand that the telemedicine visit will involve communicating with the Practitioner through live audiovisual communication technology and the disclosure of certain medical information by electronic transmission. I acknowledge that I have been given the opportunity to request an in-person assessment or other available alternative prior to the telemedicine visit and am voluntarily participating in the telemedicine visit.  I understand that I have the right to withhold or withdraw my consent to the use of telemedicine in the course of my care at any time, without affecting my right to future care or treatment, and that the Practitioner or I may terminate the telemedicine visit at any time. I understand that I have the right to inspect all information obtained and/or recorded in the course of the telemedicine visit and may receive copies of available information for a reasonable fee.  I understand that some of the potential risks of receiving the Services via telemedicine include:  Delay or interruption in medical evaluation due to technological equipment failure or disruption; Information transmitted may not be sufficient (e.g. poor  resolution of images) to allow for appropriate medical decision making by the Practitioner; and/or  In rare instances, security protocols could fail, causing a breach of personal health information.  Furthermore, I acknowledge that it is my responsibility to provide information about my medical history, conditions and care that is complete and accurate to the best of my ability. I acknowledge that Practitioner's advice, recommendations, and/or decision may be based on factors not within their control, such as incomplete or inaccurate data provided by me or distortions of diagnostic images or specimens that may result from electronic transmissions. I understand that the practice of medicine is not an exact science and that Practitioner makes no warranties or guarantees regarding treatment outcomes. I acknowledge that a copy of this consent can be made available to me via my patient portal [facility] MyChart), or I can request a printed copy by calling the office of [facility].    I understand that my insurance will be billed for this visit.   I have read or had this consent read to me. I understand the contents of this consent, which adequately explains the benefits and risks of the Services being provided via telemedicine.  I have been provided ample opportunity to ask questions regarding this consent and the Services and have had my questions answered to my satisfaction. I give my informed consent for the services to be provided through the use of telemedicine in my medical care

## 2023-09-30 NOTE — Telephone Encounter (Signed)
 Pt has been scheduled tele preop appt 10/08/23. Med rec and consent are done.

## 2023-10-08 ENCOUNTER — Ambulatory Visit: Payer: No Typology Code available for payment source | Attending: Cardiology | Admitting: Emergency Medicine

## 2023-10-08 DIAGNOSIS — Z01810 Encounter for preprocedural cardiovascular examination: Secondary | ICD-10-CM

## 2023-10-08 NOTE — Progress Notes (Signed)
Virtual Visit via Telephone Note   Because of Benjamin [name]'s co-morbid illnesses, he is at least at moderate risk for complications without adequate follow up.  This format is felt to be most appropriate for this patient at this time.  The patient did not have access to video technology/had technical difficulties with video requiring transitioning to audio format only (telephone).  All issues noted in this document were discussed and addressed.  No physical exam could be performed with this format.  Please refer to the patient's chart for his consent to telehealth for [facility].  Evaluation Performed:  Preoperative cardiovascular risk assessment _____________   Date:  10/08/2023   Patient ID:  Benjamin [name], Benjamin [name] [birth year], MRN [id number] Patient Location:  Home Provider location:   Office  Primary Care Provider:  Pearson Grippe, MD Primary Cardiologist:  Nona Dell, MD  Chief Complaint / Patient Profile   73 y.o. y/o male with a h/o permanent atrial fibrillation, carotid artery stenosis, HTN, HLD, Type II DM, prior CVA, orthostatic hypotension and Stage III CKD who is pending cervical injection with Dr. Retia Passe on 10/14/2023 and presents today for telephonic preoperative cardiovascular risk assessment.  History of Present Illness    Benjamin [name] is a 73 y.o. male who presents via audio/video conferencing for a telehealth visit today.  Pt was last seen in cardiology clinic on 03/22/2023 by Randall An, PA.  At that time HARSHAL BURR was doing well.  The patient is now pending procedure as outlined above. Since his last visit, he denies chest pain, shortness of breath, lower extremity edema, fatigue, palpitations, melena, hematuria, hemoptysis, diaphoresis, weakness, presyncope, syncope, orthopnea, and PND.  Past Medical History    Past Medical History:  Diagnosis Date   Arthritis    Essential hypertension    Gout    History of stroke    Right  occipital May 2018   Hypertensive heart disease    LVH with grade 2 diastolic dysfunction   Mixed hyperlipidemia    Statin intolerance   Rotator cuff tear    Chronic pain   Sleep apnea    a. intolerant to CPAP   Stroke (HCC) 10/1998   TIA   Tubular adenoma    Type 2 diabetes mellitus (HCC)    Past Surgical History:  Procedure Laterality Date   APPENDECTOMY     CARPAL TUNNEL RELEASE     COLONOSCOPY N/A 10/29/2013   Dr. Jena Gauss- sigmoid polyp status post cold snare removal. large sessile ascending colon polyp. debulked with saline assisted snare polypectomy . pancolonic diverticulosis. inadequate prep. tubular adenoma on bx   COLONOSCOPY N/A 02/22/2014   Dr. Jena Gauss: Saline-assisted snare polypectomy/biopsy for sprawling carpet polyp in the ascending colon which could not be completely removed. Status post biopsy (tubular adenoma), tattooed   COLONOSCOPY N/A 03/01/2014   QIH:KVQQ polypectomy hemorrhage s/p bleeding   COLONOSCOPY N/A 06/08/2015   Procedure: COLONOSCOPY;  Surgeon: Corbin Ade, MD;  [facility];  Service: Endoscopy;  Laterality: N/A;  0900   COLONOSCOPY WITH PROPOFOL N/A 11/14/2020   Procedure: COLONOSCOPY WITH PROPOFOL;  Surgeon: Corbin Ade, MD;  [facility];  Service: Endoscopy;  Laterality: N/A;  8:15am   ESOPHAGOGASTRODUODENOSCOPY N/A 02/22/2014   Dr. Jena Gauss: Erosive reflux esophagitis. Schatzki ring status post dilation. Gastric and duodenal erosions with benign biopsies.   ESOPHAGOGASTRODUODENOSCOPY (EGD) WITH PROPOFOL N/A 06/07/2022   Procedure: ESOPHAGOGASTRODUODENOSCOPY (EGD) WITH PROPOFOL;  Surgeon: Lanelle Bal, DO;  [facility];  Service: Endoscopy;  Laterality: N/A;   LAPAROSCOPIC RIGHT COLECTOMY N/A 04/16/2014   Procedure: LAPAROSCOPIC ASSISTED RIGHT COLECTOMY;  Surgeon: Ernestene Mention, MD;  Location: [facility] OR;  Service: General;  Laterality: N/A;   MALONEY DILATION N/A 02/22/2014   Procedure: Alvy Beal;   Surgeon: Corbin Ade, MD;  [facility];  Service: Endoscopy;  Laterality: N/A;   POLYPECTOMY  06/07/2022   Procedure: POLYPECTOMY;  Surgeon: Lanelle Bal, DO;  [facility];  Service: Endoscopy;;   SAVORY DILATION N/A 02/22/2014   Procedure: SAVORY DILATION;  Surgeon: Corbin Ade, MD;  [facility];  Service: Endoscopy;  Laterality: N/A;    Allergies  Allergies  Allergen Reactions   Niacin-Lovastatin Er Other (See Comments)    ADVICOR= Body aches   Statins     Body aches   Tamsulosin     Other reaction(s): Low blood pressure   Zocor [Simvastatin - High Dose] Other (See Comments)    Body aches.    Home Medications    Prior to Admission medications   Medication Sig Start Date End Date Taking? Authorizing Provider  acetaminophen (TYLENOL) 500 MG tablet Take 1,000 mg by mouth every 6 (six) hours as needed.    [provider]  Alirocumab (PRALUENT [place]) Inject into the skin.    [provider]  allopurinol (ZYLOPRIM) 100 MG tablet Take 100 mg by mouth daily.    [provider]  amLODipine (NORVASC) 2.5 MG tablet Take 1 tablet (2.5 mg total) by mouth daily. 04/10/23 07/09/23  Strader, Lennart Pall, PA-C  amLODipine (NORVASC) 2.5 MG tablet Take 1 tablet (2.5 mg total) by mouth daily. 04/10/23 07/09/23  Strader, Lennart Pall, PA-C  apixaban (ELIQUIS) 5 MG TABS tablet Take 1 tablet (5 mg total) by mouth 2 (two) times daily. 06/08/22   Shon Hale, MD  cholecalciferol (VITAMIN D3) 25 MCG (1000 UNIT) tablet Take 1,000 Units by mouth daily.    [provider]  cyclobenzaprine (FLEXERIL) 10 MG tablet Take 2 tablets by mouth at bedtime. 10/15/22   [provider]  Elastic Bandages & Supports (ABDOMINAL BINDER/ELASTIC 2XL) MISC 1 each by Does not apply route as directed. 10/24/22   Rolly Salter, MD  empagliflozin (JARDIANCE) 25 MG TABS tablet Take 12.5 mg by mouth daily. Half tablet daily    [provider]  ezetimibe (ZETIA) 10 MG tablet Take 10 mg by mouth daily. 10/24/21   [provider]  fexofenadine (ALLEGRA) 60 MG tablet Take 60 mg by mouth daily. 05/16/22   [provider]  gabapentin (NEURONTIN) 300 MG capsule Take 300-600 mg by mouth 3 (three) times daily. 09/27/23   [provider]  guaifenesin (HUMIBID E) 400 MG TABS tablet Take 400 mg by mouth 2 (two) times daily as needed (cough and congestion). Patient not taking: Reported on 09/30/2023 08/07/21   [provider]  insulin glargine (LANTUS) 100 UNIT/ML injection Inject 30 Units into the skin daily. IN THE MORNING    [provider]  levothyroxine (SYNTHROID) 25 MCG tablet Take 1 tablet (25 mcg total) by mouth daily before breakfast. 05/21/23   Dani Gobble, NP  losartan (COZAAR) 50 MG tablet Take 1 tablet (50 mg total) by mouth in the morning and at bedtime. 03/22/23 06/20/23  Strader, Lennart Pall, PA-C  metoprolol tartrate (LOPRESSOR) 25 MG tablet Take 1 tablet (25 mg total) by mouth 2 (two) times daily. 03/22/23  [place] Ouch, [place] M, PA-C  OVER THE COUNTER MEDICATION Pain relieving cream    [provider]  pantoprazole (PROTONIX) 40 MG tablet Take 1 tablet (40 mg total) by mouth in the morning and at bedtime. 06/07/22   Shon Hale, MD  potassium chloride SA (KLOR-CON M) 20 MEQ tablet Take 1 tablet (20 mEq total) by mouth daily with breakfast. 11/29/22   Roma Kayser, MD  Semaglutide, 1 MG/DOSE, 2 MG/1.5ML SOPN Inject 1 mg into the skin every Wednesday.     [provider]  sodium chloride (OCEAN) 0.65 % SOLN nasal spray Place 1 spray into both nostrils as needed for congestion.    [provider]  Tafluprost, PF, 0.0015 % SOLN Place 1 drop into both eyes every evening. 01/10/22   [provider]  Timolol Maleate PF 0.5 % SOLN Place 1 drop into both eyes in the morning and at bedtime. 01/10/22   [provider]    Physical Exam    Vital  Signs:  LEONTA IGARASHI does not have vital signs available for review today.  Given telephonic nature of communication, physical exam is limited. AAOx3. NAD. Normal affect.  Speech and respirations are unlabored.  Accessory Clinical Findings    None  Assessment & Plan    1.  Preoperative Cardiovascular Risk Assessment: According to the Revised Cardiac Risk Index (RCRI), his Perioperative Risk of Major Cardiac Event is (%): 6.6. His Functional Capacity in METs is: 7.34 according to the Duke Activity Status Index (DASI). Therefore, based on ACC/AHA guidelines, patient would be at acceptable risk for the planned procedure without further cardiovascular testing.   The patient was advised that if he develops new symptoms prior to surgery to contact our office to arrange for a follow-up visit, and he verbalized understanding.  Patient can hold Eliquis for 3 days prior to procedure. No Lovenox bridge needed.  A copy of this note will be routed to requesting surgeon.  Time:   Today, I have spent 6 minutes with the patient with telehealth technology discussing medical history, symptoms, and management plan.     Denyce Robert, NP  10/08/2023, 11:10 AM

## 2023-10-15 ENCOUNTER — Telehealth: Payer: Self-pay | Admitting: Cardiology

## 2023-10-15 NOTE — Telephone Encounter (Signed)
STAT if HR is under 50 or over 120 (normal HR is 60-100 beats per minute)  What is your heart rate?   60 (today)  Do you have a log of your heart rate readings (document readings)?  No  Do you have any other symptoms?   No  Wife Gavin Pound) noted patient's HR has been trending in the 40's for the last 2-3 weeks.  Wife wants advice on next steps.

## 2023-10-15 NOTE — Telephone Encounter (Signed)
Spoke with wife who reports that pt Hr has been trending in the 40's over the last 3-4 weeks. Pt c/o SOB, fatigue, palpitations. Denies dizziness, and chest pain. Wife states that they have seen several different doctors for appts and when checked by their office the Hr is in the 40's too. BP this morning with 185/90 HR 60 but pt did receive an injection in the neck yesterday. Yesterday's BP was around 140/70. Please advise.

## 2023-10-16 NOTE — Telephone Encounter (Signed)
Spoke with wife and informed her of Dr. Ival Bible note. Wife reports that when she checked it last night he was in the 28's and 70's. Pt's PCP that he saw on yesterday states that he is in A-Fib. Msg sent to scheduling to schedule appt with Dr. Diona Browner and nurse visit.

## 2023-10-23 ENCOUNTER — Ambulatory Visit: Payer: No Typology Code available for payment source | Attending: Cardiology

## 2023-10-23 VITALS — BP 164/70 | HR 54 | Ht 73.0 in

## 2023-10-23 DIAGNOSIS — I4821 Permanent atrial fibrillation: Secondary | ICD-10-CM

## 2023-10-23 MED ORDER — AMLODIPINE BESYLATE 5 MG PO TABS: 5.0000 mg | ORAL_TABLET | Freq: Every day | ORAL | 3 refills | Status: DC

## 2023-10-23 NOTE — Patient Instructions (Addendum)
 Medication Instructions:    DECREASE Metoprolol   to 12.5 mg (1/2 tablet) twice a day for 2 days and then STOP   INCREASE Amlodipine  to 5 mg daily      Thank you for choosing [facility] !

## 2023-10-24 ENCOUNTER — Telehealth: Payer: Self-pay | Admitting: Cardiology

## 2023-10-24 MED ORDER — AMLODIPINE BESYLATE 5 MG PO TABS: 7.5000 mg | ORAL_TABLET | Freq: Every day | ORAL | Status: DC

## 2023-10-24 NOTE — Telephone Encounter (Signed)
 Spoke to patients wife who stated that pt was already taking 5 mg tablets prior to nurse visit on 2/5. [name] is questioning if pt needs to continue taking 5 mg or titrate medication.   Please advise.

## 2023-10-24 NOTE — Telephone Encounter (Signed)
 Left message for wife to return call to discuss MD message/plan.

## 2023-10-24 NOTE — Telephone Encounter (Signed)
 Playing telephone tag with patient,left message to return call.

## 2023-10-24 NOTE — Telephone Encounter (Signed)
 Pt c/o medication issue:  1. Name of Medication: amLODipine  (NORVASC ) 5 MG tablet   2. How are you currently taking this medication (dosage and times per day)?   Take 1 tablet (5 mg total) by mouth daily    3. Are you having a reaction (difficulty breathing--STAT)? No  4. What is your medication issue? Wife would like a c/b regarding dosage of above medication. Please advise

## 2023-10-24 NOTE — Telephone Encounter (Signed)
[name] returning nurses call. Please advise.  

## 2023-10-24 NOTE — Telephone Encounter (Signed)
 Wife called back and verbalized patient was taking amlodipine  5 mg at home.He will increase dose to 7.5 mg. She says she has some 2.5 mg tablets left She also confirmed he is taking losartan  100 mg tablet, 1/2 tablet twice a day.

## 2023-11-06 ENCOUNTER — Ambulatory Visit: Payer: Medicare HMO | Admitting: Cardiology

## 2023-11-07 ENCOUNTER — Telehealth: Payer: Self-pay | Admitting: Cardiology

## 2023-11-07 ENCOUNTER — Encounter: Payer: Self-pay | Admitting: Cardiology

## 2023-11-07 ENCOUNTER — Ambulatory Visit: Payer: No Typology Code available for payment source

## 2023-11-07 ENCOUNTER — Ambulatory Visit: Payer: No Typology Code available for payment source | Attending: Cardiology | Admitting: Cardiology

## 2023-11-07 ENCOUNTER — Other Ambulatory Visit: Payer: Self-pay | Admitting: Cardiology

## 2023-11-07 VITALS — BP 132/78 | HR 52 | Ht 73.0 in | Wt 249.2 lb

## 2023-11-07 DIAGNOSIS — E782 Mixed hyperlipidemia: Secondary | ICD-10-CM

## 2023-11-07 DIAGNOSIS — R001 Bradycardia, unspecified: Secondary | ICD-10-CM

## 2023-11-07 DIAGNOSIS — M791 Myalgia, unspecified site: Secondary | ICD-10-CM

## 2023-11-07 DIAGNOSIS — I4821 Permanent atrial fibrillation: Secondary | ICD-10-CM

## 2023-11-07 DIAGNOSIS — I10 Essential (primary) hypertension: Secondary | ICD-10-CM

## 2023-11-07 DIAGNOSIS — T466X5A Adverse effect of antihyperlipidemic and antiarteriosclerotic drugs, initial encounter: Secondary | ICD-10-CM

## 2023-11-07 NOTE — Telephone Encounter (Signed)
Checking percert on the following   72 hour ZIO XT dx: bradycardia

## 2023-11-07 NOTE — Patient Instructions (Signed)
Medication Instructions:  Your physician recommends that you continue on your current medications as directed. Please refer to the Current Medication list given to you today.  Labwork: none  Testing/Procedures: 72 hour ZIO XT  Follow-Up: Your physician recommends that you schedule a follow-up appointment in: 3 months  Any Other Special Instructions Will Be Listed Below (If Applicable).  If you need a refill on your cardiac medications before your next appointment, please call your pharmacy.

## 2023-11-07 NOTE — Progress Notes (Signed)
    Cardiology Office Note  Date: 11/07/2023   ID: Benjamin, [name] 1951/08/30, MRN [id number]  History of Present Illness: Benjamin [name] is a 73 y.o. male last seen in July 2024 by Ms. Strader PA-C, I reviewed the note.  He is here today with his wife for a follow-up visit.  Interval telephone notes and blood pressure checks reviewed.  Main complaint is of fatigue, occasionally a sense of palpitations, no exertional chest pain, no sudden dizziness or syncope.  Blood pressure trend has been better following adjustments in the interim.  We went over his current medications.  Present regimen includes Praluent, Norvasc 7.5 mg daily, Eliquis 5 mg twice daily, Jardiance, Zetia 10 mg daily, Cozaar 50 mg twice daily, and Ozempic.  He does not report any spontaneous bleeding problems.  As already documented, he was taken off metoprolol given concern for symptomatic bradycardia with heart rate down to the 40s.  I reviewed his ECG today which shows atrial fibrillation at 54 bpm with right bundle branch block and left posterior fascicular block.  Physical Exam: VS:  BP 132/78   Pulse (!) 52   Ht 6\' 1"  (1.854 m)   Wt 249 lb 3.2 oz (113 kg)   SpO2 96%   BMI 32.88 kg/m , BMI Body mass index is 32.88 kg/m.  Wt Readings from Last 3 Encounters:  11/07/23 249 lb 3.2 oz (113 kg)  03/22/23 244 lb (110.7 kg)  12/27/22 246 lb 3.2 oz (111.7 kg)    General: Patient appears comfortable at rest. HEENT: Conjunctiva and lids normal. Neck: Supple, no elevated JVP or carotid bruits. Lungs: Clear to auscultation, nonlabored breathing at rest. Cardiac: Irregularly irregular, 1/6 systolic murmur, no gallop.  ECG:  An ECG dated 10/23/2023 was personally reviewed today and demonstrated:  Atrial fibrillation with right bundle branch block, heart rate 52 bpm.  Labwork: 05/03/2023: BUN 22; Creatinine, Ser 1.55; Potassium 3.9; Sodium 138   Other Studies Reviewed Today:  No interval cardiac testing for review  today.  Assessment and Plan:  1.  Permanent atrial fibrillation, CHA2DS2-VASc score is 6.  LVEF 50 to 55% with moderate left atrial dilatation by echocardiogram in February 2024.  He has had bradycardia with heart rate as low as the 40s resulting in discontinuation of Lopressor.  ECG reviewed today and he does have evidence of conduction system disease although heart rate is up into the mid 50s. Plan will be to obtain a 72-hour ZIO for further investigation given complaints of continued fatigue.  Otherwise continue Eliquis at dose outlined above.  2.  Primary hypertension.  Pressure trend is better on current regimen.  Continue Norvasc and Cozaar at doses outlined above.  3.  History of suspected autonomic dysfunction and orthostasis.  This has been less of a concern, no active symptoms.  4.  Mixed hyperlipidemia with statin intolerance.  He remains on Praluent with lab work followed through the [facility].  5.  OSA with history of CPAP intolerance.  6.  CKD stage IIIb, creatinine 1.5 and GFR 47 in August 2024.  Disposition:  Follow up  3 months.  Signed, Jonelle Sidle, M.D., F.A.C.C. [facility] at [facility]

## 2023-11-13 ENCOUNTER — Ambulatory Visit: Payer: No Typology Code available for payment source | Admitting: Cardiology

## 2024-01-29 ENCOUNTER — Ambulatory Visit: Payer: No Typology Code available for payment source | Attending: Cardiology | Admitting: Cardiology

## 2024-01-29 ENCOUNTER — Encounter: Payer: Self-pay | Admitting: Cardiology

## 2024-01-29 VITALS — BP 126/78 | HR 84 | Ht 73.0 in | Wt 242.2 lb

## 2024-01-29 DIAGNOSIS — I10 Essential (primary) hypertension: Secondary | ICD-10-CM | POA: Diagnosis not present

## 2024-01-29 DIAGNOSIS — I4821 Permanent atrial fibrillation: Secondary | ICD-10-CM

## 2024-01-29 DIAGNOSIS — T466X5D Adverse effect of antihyperlipidemic and antiarteriosclerotic drugs, subsequent encounter: Secondary | ICD-10-CM

## 2024-01-29 DIAGNOSIS — M791 Myalgia, unspecified site: Secondary | ICD-10-CM | POA: Diagnosis not present

## 2024-01-29 DIAGNOSIS — E782 Mixed hyperlipidemia: Secondary | ICD-10-CM

## 2024-01-29 MED ORDER — AMLODIPINE BESYLATE 5 MG PO TABS: 7.5000 mg | ORAL_TABLET | Freq: Every day | ORAL | 2 refills | Status: DC

## 2024-01-29 NOTE — Patient Instructions (Signed)
 Medication Instructions:  Your physician recommends that you continue on your current medications as directed. Please refer to the Current Medication list given to you today.   Labwork: None today  Testing/Procedures: None today  Follow-Up: 6 months  Any Other Special Instructions Will Be Listed Below (If Applicable).  If you need a refill on your cardiac medications before your next appointment, please call your pharmacy.

## 2024-01-29 NOTE — Progress Notes (Signed)
    Cardiology Office Note  Date: 01/29/2024   ID: Benjamin, [name] Apr 07, 1951, MRN [id number]  History of Present Illness: Benjamin [name] is a 73 y.o. male last seen in February.  He is here today with his wife for a follow-up visit.  Overall, states that he has been doing reasonably well.  No sudden dizziness or syncope, no regular sense of palpitations.  We went over his medications.  He is tolerating current regimen well.  No reported bleeding problems on Eliquis .  He continues to follow lab work through the [facility].  Blood pressure has been stable without recent medication adjustments.  He does need his Norvasc  prescription updated to 7.5 mg daily.  I reviewed his interval cardiac monitor results as noted below.  Physical Exam: VS:  BP 126/78 (BP Location: Left Arm)   Pulse 84   Ht 6\' 1"  (1.854 m)   Wt 242 lb 3.2 oz (109.9 kg)   SpO2 97%   BMI 31.95 kg/m , BMI Body mass index is 31.95 kg/m.  Wt Readings from Last 3 Encounters:  01/29/24 242 lb 3.2 oz (109.9 kg)  11/07/23 249 lb 3.2 oz (113 kg)  03/22/23 244 lb (110.7 kg)    General: Patient appears comfortable at rest. HEENT: Conjunctiva and lids normal. Neck: Supple, no elevated JVP or carotid bruits. Lungs: Clear to auscultation, nonlabored breathing at rest. Cardiac: Irregularly irregular, 1/6 systolic murmur without gallop.  ECG:  An ECG dated 11/07/2023 was personally reviewed today and demonstrated:  Atrial fibrillation with right bundle branch block and left posterior fascicular block.  Labwork: 05/03/2023: BUN 22; Creatinine, Ser 1.55; Potassium 3.9; Sodium 138   Other Studies Reviewed Today:  Cardiac monitor March 2025: ZIO monitor reviewed.  2 days, 17 hours analyzed.   Atrial fibrillation was present throughout with heart rate ranging from 36 bpm up to 126 bpm and average heart rate 62 bpm.  IVCD conduction pattern present. There were occasional PVCs representing 3.4% total beats with  otherwise rare ventricular couplets and triplets.  Also limited ventricular bigeminy and trigeminy. No significant pauses or high degree heart block.  Assessment and Plan:  1.  Permanent atrial fibrillation, CHA2DS2-VASc score is 6.  LVEF 50 to 55% with moderate left atrial dilatation by echocardiogram in February 2024.  Cardiac monitor in March demonstrated average heart rate 62 bpm with intermittent bradycardia but no pauses or high degree heart block.  Also occasional PVCs.  He is no longer on AV nodal blockers.  Continue Eliquis  5 mg twice daily for stroke prophylaxis.   2.  Primary hypertension.  Plan to continue current regimen including Cozaar  50 mg daily and Norvasc  7.5 mg daily.   3.  History of suspected autonomic dysfunction and orthostasis.  This has been less of a concern, no active symptoms.   4.  Mixed hyperlipidemia with statin intolerance.  Continue Zetia  10 mg daily along with Praluent.   5.  CKD stage IIIb, creatinine 1.5 and GFR 47 in August 2024.  Disposition:  Follow up 6 months.  Signed, Gerard Knight, M.D., F.A.C.C. [facility] at [facility]

## 2024-02-28 ENCOUNTER — Encounter (HOSPITAL_COMMUNITY): Payer: Self-pay

## 2024-02-28 ENCOUNTER — Other Ambulatory Visit: Payer: Self-pay

## 2024-02-28 ENCOUNTER — Emergency Department (HOSPITAL_COMMUNITY)
Admission: EM | Admit: 2024-02-28 | Discharge: 2024-02-28 | Disposition: A | Discharge status: Home / Self Care | Attending: Emergency Medicine | Admitting: Emergency Medicine

## 2024-02-28 DIAGNOSIS — Z794 Long term (current) use of insulin: Secondary | ICD-10-CM | POA: Diagnosis not present

## 2024-02-28 DIAGNOSIS — I10 Essential (primary) hypertension: Secondary | ICD-10-CM | POA: Insufficient documentation

## 2024-02-28 DIAGNOSIS — Z79899 Other long term (current) drug therapy: Secondary | ICD-10-CM | POA: Insufficient documentation

## 2024-02-28 DIAGNOSIS — T22211A Burn of second degree of right forearm, initial encounter: Secondary | ICD-10-CM | POA: Insufficient documentation

## 2024-02-28 DIAGNOSIS — T310 Burns involving less than 10% of body surface: Secondary | ICD-10-CM | POA: Insufficient documentation

## 2024-02-28 DIAGNOSIS — Z23 Encounter for immunization: Secondary | ICD-10-CM | POA: Diagnosis not present

## 2024-02-28 DIAGNOSIS — X04XXXA Exposure to ignition of highly flammable material, initial encounter: Secondary | ICD-10-CM | POA: Diagnosis not present

## 2024-02-28 DIAGNOSIS — T22231A Burn of second degree of right upper arm, initial encounter: Secondary | ICD-10-CM | POA: Insufficient documentation

## 2024-02-28 DIAGNOSIS — E119 Type 2 diabetes mellitus without complications: Secondary | ICD-10-CM | POA: Insufficient documentation

## 2024-02-28 DIAGNOSIS — Z7984 Long term (current) use of oral hypoglycemic drugs: Secondary | ICD-10-CM | POA: Insufficient documentation

## 2024-02-28 DIAGNOSIS — Z7901 Long term (current) use of anticoagulants: Secondary | ICD-10-CM | POA: Insufficient documentation

## 2024-02-28 LAB — CBG MONITORING, ED: Glucose-Capillary: 154 mg/dL — ABNORMAL HIGH (ref 70–99)

## 2024-02-28 MED ORDER — SILVER SULFADIAZINE 1 % EX CREA
TOPICAL_CREAM | Freq: Once | CUTANEOUS | Status: AC
  Filled 2024-02-28: qty 50

## 2024-02-28 MED ORDER — OXYCODONE-ACETAMINOPHEN 5-325 MG PO TABS: ORAL_TABLET | ORAL | 0 refills | Status: AC

## 2024-02-28 MED ORDER — TETANUS-DIPHTH-ACELL PERTUSSIS 5-2.5-18.5 LF-MCG/0.5 IM SUSY
0.5000 mL | PREFILLED_SYRINGE | Freq: Once | INTRAMUSCULAR | Status: AC
  Administered 2024-02-28: 0.5 mL via INTRAMUSCULAR
  Filled 2024-02-28: qty 0.5

## 2024-02-28 MED ORDER — SILVER SULFADIAZINE 1 % EX CREA: 1.0000 | TOPICAL_CREAM | Freq: Every day | CUTANEOUS | 1 refills | Status: AC
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
## 2024-02-28 NOTE — ED Notes (Signed)
 Pt/family received d/c paperwork at this time. After going over the paperwork any questions, comments, or concerns were answered to the best of this nurse's knowledge. The pt/family verbally acknowledged the teachings/instructions.
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
## 2024-03-31 NOTE — Therapy (Deleted)
 1

## 2024-04-01 ENCOUNTER — Ambulatory Visit (HOSPITAL_COMMUNITY): Admitting: Physical Therapy

## 2024-04-03 ENCOUNTER — Ambulatory Visit (HOSPITAL_COMMUNITY): Admitting: Physical Therapy

## 2024-04-07 ENCOUNTER — Ambulatory Visit (HOSPITAL_COMMUNITY): Admitting: Physical Therapy

## 2024-04-09 ENCOUNTER — Ambulatory Visit (HOSPITAL_COMMUNITY): Admitting: Physical Therapy

## 2024-04-14 ENCOUNTER — Ambulatory Visit (HOSPITAL_COMMUNITY): Admitting: Physical Therapy

## 2024-04-16 ENCOUNTER — Ambulatory Visit (HOSPITAL_COMMUNITY): Admitting: Physical Therapy

## 2024-04-21 ENCOUNTER — Ambulatory Visit (HOSPITAL_COMMUNITY): Admitting: Physical Therapy

## 2024-04-23 ENCOUNTER — Ambulatory Visit (HOSPITAL_COMMUNITY): Admitting: Physical Therapy

## 2024-04-28 ENCOUNTER — Ambulatory Visit (HOSPITAL_COMMUNITY): Admitting: Physical Therapy

## 2024-04-30 ENCOUNTER — Ambulatory Visit (HOSPITAL_COMMUNITY): Admitting: Physical Therapy

## 2024-06-26 ENCOUNTER — Telehealth: Payer: Self-pay | Admitting: Cardiology

## 2024-06-26 NOTE — Telephone Encounter (Signed)
 I will reach out to Dr. Darlean office that they need to fax over a clearance request to fax [phone] attn: preop team.   Office is closed, will call Dr. Bill office Monday AM. [phone]

## 2024-06-26 NOTE — Telephone Encounter (Signed)
 Pt spouse called in wanting to discuss injections, and if it would be a good idea Please Advise

## 2024-06-26 NOTE — Telephone Encounter (Signed)
 I do not see that our office received a clearance request for this pt. There is a request back from 09/26/23 from Dr. Darlean office . I will reach out to the preop APP to confirm if they need a new request.

## 2024-06-26 NOTE — Telephone Encounter (Signed)
 Preoperative team,  Yes, we will need new formal request for patient's upcoming procedure.  Please reach out to patient and requesting office.  We will then be able to provide recommendations from a cardiac standpoint.  Thank you for your help.  Josefa HERO. Kayci Belleville NP-C     06/26/2024, 4:27 PM [facility] [address] [place], [place] [address] Office [phone]

## 2024-06-26 NOTE — Telephone Encounter (Signed)
 Per protocol Eliquis  is held for 5 days and restarted ASAP as allowed by surgeon.  If there needs to be a longer duration of holding medication, should contact primary cardiologist.

## 2024-06-26 NOTE — Telephone Encounter (Signed)
 Benjamin [name] says a Dr.Saulo,  at the [facility] in [place] wants to do spinal injections in neck and lower back and had told patient this would be in done in 3 separate occasions . Benjamin [name] tells me they would require Benjamin [name] to come off Eliquis  for 7 days each time and have asked them to speak with cardiology before they schedule the procedures.  Please advise.

## 2024-06-29 NOTE — Telephone Encounter (Signed)
 Called Dr. Bill office to s/w the nurse or surgery scheduler for the pt who reached out to us  Friday. See previous notes.   Per Benjamin Beauvais, FNP last Friday we will need a new clearance request to be faxed to our office.  Fax # [phone] preop team.

## 2024-07-01 NOTE — Telephone Encounter (Signed)
 I s/w Dr. Bill office and stated that we will need a new preop clearance request to be faxed to our office, the one from 09/2023 is too old. Fax# [phone] given to please fax over today ASAP as I was just informed his procedure is 07/07/24.

## 2024-07-14 NOTE — Telephone Encounter (Signed)
 I was able to reach Dr. Darlean office today. I stated that the pt stated to his cardiologist he was to have a procedure with Dr. Darlean however our office has not yet received a clearance request.   I was then transferred to the triage line and was put into a vm. I left a message that we have tried to contact their office on behalf of the pt, in regard to injections to be done per Mr. Mykle Pascua.   I need the complete information before I can have the preop team review for Dr. Debera.   At this point I will need to know where the location of the injection will be or the procedure that is to be done   PLEASE FAX A CLEARANCE REQUEST TO [phone] TODAY PLEASE SO THAT OUR PREOP TEAM MAY BEGIN THE PREOP PROCESS    Pre-operative Risk Assessment    Patient Name: Benjamin [name]  DOB: [birth year] MRN: [id number]   Date of last office visit: 01/29/24 DR. MCDOWELL Date of next office visit: 08/03/24 DR. MCDOWELL, 6 MONTH F/U   Request for Surgical Clearance    Procedure: NEED PROCEDURE TO BE DONE  Date of Surgery:  Clearance                                 Surgeon:  DR. DARLEAN Surgeon's Group or Practice Name:  [facility] Phone number:  [phone] Fax number:  [phone]   Type of Clearance Requested:   - Medical  - Pharmacy:  Hold Apixaban  (Eliquis )     Type of Anesthesia:  ANY ANESTHESIA TO BE USED.    Additional requests/questions:    Bonney Niels Jest   07/14/2024, 3:08 PM

## 2024-07-15 NOTE — Telephone Encounter (Signed)
     Pre-operative Risk Assessment    Patient Name: Benjamin [name]  DOB: 17-Nov-1950 MRN: [id number]   Date of last office visit: 01/29/24 DR. MCDOWELL Date of next office visit: 08/03/24 DR. MCDOWELL, 6 MONTH F/U   Request for Surgical Clearance    Procedure: Cervical injection   Date of Surgery:  Clearance                                 Surgeon:  DR. DARLEAN Surgeon's Group or Practice Name:  [facility] Phone number:  [phone] Fax number:  [phone]   Type of Clearance Requested:   - Medical  - Pharmacy:  Hold Apixaban  (Eliquis )     Type of Anesthesia:  Not indicated    Additional requests/questions:    [name] [name]   07/15/2024, 11:55 AM

## 2024-07-19 NOTE — Telephone Encounter (Signed)
 Patient with diagnosis of atrial fibrillation on Eliquis  for anticoagulation.    rocedure: Cervical injection    Date of Surgery:  Clearance     CHA2DS2-VASc Score = 7   This indicates a 11.2% annual risk of stroke. The patient's score is based upon: CHF History: 1 HTN History: 1 Diabetes History: 1 Stroke History: 2 Vascular Disease History: 1 Age Score: 1 Gender Score: 0   Stroke 01/2017  CrCl 65 Platelet count 221  Patient has not had an Afib/aflutter ablation in the last 3 months, DCCV within the last 4 weeks or a watchman implanted in the last 45 days   Per office protocol, patient can hold Eliquis  for 3 days prior to procedure.   Patient will not need bridging with Lovenox  (enoxaparin ) around procedure.  **This guidance is not considered finalized until pre-operative APP has relayed final recommendations.**

## 2024-07-20 NOTE — Telephone Encounter (Signed)
 Primary Cardiologist:Samuel Debera, MD  Chart reviewed as part of pre-operative protocol coverage. Because of Benjamin [name]'s past medical history and time since last visit, he/she will require a follow-up visit in order to better assess preoperative cardiovascular risk.  Pre-op covering staff: - Patient already has appointment with Dr. Debera for 11/17 at which time clearance will be addressed. Appointment notes have been updated.  - Please contact requesting surgeon's office via preferred method (i.e, phone, fax) to inform them of need for appointment prior to surgery.  Per office protocol, patient can hold Eliquis  for 3 days prior to procedure and should resume as soon as hemodynamically stable. Patient will not need bridging with Lovenox  (enoxaparin ) around procedure.  Rosaline EMERSON Bane, NP-C  07/20/2024, 8:18 AM [address] [place], [place] [address] Office [phone] Fax [phone]

## 2024-08-03 ENCOUNTER — Encounter: Payer: Self-pay | Admitting: Cardiology

## 2024-08-03 ENCOUNTER — Ambulatory Visit: Attending: Cardiology | Admitting: Cardiology

## 2024-08-03 VITALS — BP 152/80 | HR 56 | Ht 73.0 in | Wt 251.6 lb

## 2024-08-03 DIAGNOSIS — M791 Myalgia, unspecified site: Secondary | ICD-10-CM | POA: Diagnosis not present

## 2024-08-03 DIAGNOSIS — I10 Essential (primary) hypertension: Secondary | ICD-10-CM | POA: Diagnosis not present

## 2024-08-03 DIAGNOSIS — I4821 Permanent atrial fibrillation: Secondary | ICD-10-CM

## 2024-08-03 DIAGNOSIS — E782 Mixed hyperlipidemia: Secondary | ICD-10-CM | POA: Diagnosis not present

## 2024-08-03 DIAGNOSIS — T466X5D Adverse effect of antihyperlipidemic and antiarteriosclerotic drugs, subsequent encounter: Secondary | ICD-10-CM

## 2024-08-03 MED ORDER — AMLODIPINE BESYLATE 10 MG PO TABS: 10.0000 mg | ORAL_TABLET | Freq: Every day | ORAL | 3 refills | Status: AC

## 2024-08-03 NOTE — Progress Notes (Signed)
 Cardiology Office Note  Date: 08/03/2024   ID: Benjamin, [name] 05/29/1951, MRN [id number]  History of Present Illness: Benjamin [name] is a 73 y.o. male last seen in May.  He is here today with his wife for a follow-up visit.  Reports no exertional chest pain or increasing sense of palpitations.  He has had a lot of pain related to cervical radiculopathy.  Per chart review, patient being considered for cervical spine injection (Benjamin [name]), request to hold Eliquis .  Apparently, initial request was to hold Eliquis  7 days prior to injection with anticipated repeat procedures at least 3 times over a period of 4 to 6 weeks.  Generally, our recommendation is to hold Eliquis  3 days prior to these types of injections.  CHA2DS2-VASc score is 7.  I reviewed his medications which are stable from a cardiac perspective.  He does not report any spontaneous bleeding problems on Eliquis .  I rechecked his blood pressure today, he states that it has been elevated on similar medical checks in the interim.  We discussed up titration of Norvasc .  I went over his most recent lab work.  Physical Exam: VS:  BP (!) 152/80 (BP Location: Right Arm)   Pulse (!) 56   Ht 6' 1 (1.854 m)   Wt 251 lb 9.6 oz (114.1 kg)   SpO2 95%   BMI 33.19 kg/m , BMI Body mass index is 33.19 kg/m.  Wt Readings from Last 3 Encounters:  08/03/24 251 lb 9.6 oz (114.1 kg)  02/28/24 242 lb (109.8 kg)  01/29/24 242 lb 3.2 oz (109.9 kg)    General: Patient appears comfortable at rest. HEENT: Conjunctiva and lids normal. Neck: Supple, no elevated JVP or carotid bruits. Lungs: Clear to auscultation, nonlabored breathing at rest. Cardiac: Irregularly irregular, 1/6 systolic murmur.. Extremities: No pitting edema.  ECG:  An ECG dated 11/07/2023 was personally reviewed today and demonstrated:  Atrial fibrillation with right bundle branch block and left posterior fascicular block.  Labwork:  August 2024: GFR 47 January 2025:  Cholesterol 99, triglycerides 153, HDL 42, LDL 35 April 2025: Hemoglobin 15.7, platelets 221, creatinine 1.4  Other Studies Reviewed Today:  No interval cardiac testing for review today.  Assessment and Plan:  1.  Permanent atrial fibrillation, CHA2DS2-VASc score is 7.  LVEF 50 to 55% with moderate left atrial dilatation by echocardiogram in February 2024.  Cardiac monitor in March demonstrated average heart rate 62 bpm with intermittent bradycardia but no pauses or high degree heart block.  Also occasional PVCs.  He is symptomatically stable, no dizziness or syncope, no increasing palpitations.  Currently not on any AV nodal blockers.  Plan to continue Eliquis  5 mg twice daily for stroke prophylaxis.  2.  Request to hold Eliquis  for cervical spine injections by Benjamin [name].  Typically we recommend a 3-day hold prior to procedure, my understanding is that a 7-day hold has been requested.  I think that is more than enough, frankly 5 days might be a good compromise if otherwise Benjamin [name] is not willing to do procedure with a 3-day hold.  The longer he is off anticoagulation, there is an increased chance of stroke, so minimizing time off anticoagulation makes sense particularly with more than one procedure planned.  I have asked Benjamin [name] to discuss this with Benjamin [name].   3.  Primary hypertension.  Increase Norvasc  to 10 mg daily and otherwise continue Cozaar  50 mg twice daily.   4.  History of suspected  autonomic dysfunction and orthostasis.  This has been less of a concern, no active symptoms.   5.  Mixed hyperlipidemia with statin intolerance.  HDL 42 and LDL 35 in January.  Continue Zetia  10 mg daily along with Praluent.   6.  CKD stage IIIb, creatinine 1.4 in April.  Disposition:  Follow up 6 months.  Signed, Benjamin [name], M.D., F.A.C.C. [facility]

## 2024-08-03 NOTE — Patient Instructions (Addendum)
 Medication Instructions:  Your physician has recommended you make the following change in your medication:  Increase amlodipine to 10 mg daily Continue all other medications as prescribed  Labwork: none  Testing/Procedures: none  Follow-Up: Your physician recommends that you schedule a follow-up appointment in: 6 months  Any Other Special Instructions Will Be Listed Below (If Applicable).  If you need a refill on your cardiac medications before your next appointment, please call your pharmacy.

## 2024-09-18 ENCOUNTER — Telehealth: Payer: Self-pay | Admitting: Cardiology

## 2024-09-18 NOTE — Telephone Encounter (Signed)
 [name] would like a c/b regarding documentation being sent to the [place] in order for pt to continue seeing Dr. Debera

## 2024-09-18 NOTE — Telephone Encounter (Signed)
 Wife will call back on Monday with information regarding letter.

## 2025-02-09 ENCOUNTER — Ambulatory Visit: Admitting: Cardiology
